# Patient Record
Sex: Female | Born: 1940 | Race: White | Hispanic: No | Marital: Married | Smoking: Former smoker
Health system: Southern US, Community
[De-identification: ages and names within clinical notes are randomized; demographics above are authoritative.]

## PROBLEM LIST (undated history)

## (undated) DIAGNOSIS — C719 Malignant neoplasm of brain, unspecified: Secondary | ICD-10-CM

## (undated) HISTORY — DX: Malignant neoplasm of brain, unspecified: C71.9

---

## 1992-10-13 HISTORY — PX: APPENDECTOMY: SHX54

## 1999-09-04 ENCOUNTER — Other Ambulatory Visit: Admission: RE | Admit: 1999-09-04 | Discharge: 1999-09-04 | Payer: Self-pay | Admitting: Obstetrics & Gynecology

## 1999-09-10 ENCOUNTER — Other Ambulatory Visit: Admission: RE | Admit: 1999-09-10 | Discharge: 1999-09-10 | Payer: Self-pay | Admitting: Internal Medicine

## 1999-09-10 ENCOUNTER — Encounter (INDEPENDENT_AMBULATORY_CARE_PROVIDER_SITE_OTHER): Payer: Self-pay

## 2001-01-11 ENCOUNTER — Ambulatory Visit (HOSPITAL_COMMUNITY): Admission: RE | Admit: 2001-01-11 | Discharge: 2001-01-11 | Payer: Self-pay | Admitting: Pulmonary Disease

## 2001-01-11 ENCOUNTER — Encounter: Payer: Self-pay | Admitting: Pulmonary Disease

## 2001-02-17 ENCOUNTER — Other Ambulatory Visit: Admission: RE | Admit: 2001-02-17 | Discharge: 2001-02-17 | Payer: Self-pay | Admitting: Obstetrics & Gynecology

## 2002-03-08 ENCOUNTER — Other Ambulatory Visit: Admission: RE | Admit: 2002-03-08 | Discharge: 2002-03-08 | Payer: Self-pay | Admitting: Obstetrics & Gynecology

## 2003-08-07 ENCOUNTER — Other Ambulatory Visit: Admission: RE | Admit: 2003-08-07 | Discharge: 2003-08-07 | Payer: Self-pay | Admitting: Obstetrics & Gynecology

## 2004-08-20 ENCOUNTER — Ambulatory Visit: Payer: Self-pay | Admitting: Internal Medicine

## 2004-09-02 ENCOUNTER — Other Ambulatory Visit: Admission: RE | Admit: 2004-09-02 | Discharge: 2004-09-02 | Payer: Self-pay | Admitting: Obstetrics & Gynecology

## 2004-09-03 ENCOUNTER — Ambulatory Visit: Payer: Self-pay | Admitting: Internal Medicine

## 2005-10-20 ENCOUNTER — Ambulatory Visit: Payer: Self-pay | Admitting: Pulmonary Disease

## 2005-11-04 ENCOUNTER — Ambulatory Visit: Payer: Self-pay | Admitting: Pulmonary Disease

## 2005-11-24 ENCOUNTER — Other Ambulatory Visit: Admission: RE | Admit: 2005-11-24 | Discharge: 2005-11-24 | Payer: Self-pay | Admitting: Obstetrics & Gynecology

## 2005-12-02 ENCOUNTER — Encounter: Admission: RE | Admit: 2005-12-02 | Discharge: 2005-12-02 | Payer: Self-pay | Admitting: Obstetrics & Gynecology

## 2005-12-31 ENCOUNTER — Ambulatory Visit: Payer: Self-pay | Admitting: Pulmonary Disease

## 2006-12-23 ENCOUNTER — Encounter: Admission: RE | Admit: 2006-12-23 | Discharge: 2006-12-23 | Payer: Self-pay | Admitting: Obstetrics & Gynecology

## 2007-01-06 ENCOUNTER — Encounter: Admission: RE | Admit: 2007-01-06 | Discharge: 2007-01-06 | Payer: Self-pay | Admitting: Obstetrics & Gynecology

## 2007-05-21 ENCOUNTER — Ambulatory Visit: Payer: Self-pay | Admitting: Pulmonary Disease

## 2008-01-03 ENCOUNTER — Encounter: Admission: RE | Admit: 2008-01-03 | Discharge: 2008-01-03 | Payer: Self-pay | Admitting: Pulmonary Disease

## 2008-07-27 DIAGNOSIS — M545 Low back pain, unspecified: Secondary | ICD-10-CM | POA: Insufficient documentation

## 2008-07-27 DIAGNOSIS — I872 Venous insufficiency (chronic) (peripheral): Secondary | ICD-10-CM | POA: Insufficient documentation

## 2008-07-27 DIAGNOSIS — I491 Atrial premature depolarization: Secondary | ICD-10-CM

## 2008-07-27 DIAGNOSIS — D126 Benign neoplasm of colon, unspecified: Secondary | ICD-10-CM

## 2008-07-27 DIAGNOSIS — M4120 Other idiopathic scoliosis, site unspecified: Secondary | ICD-10-CM | POA: Insufficient documentation

## 2008-07-27 DIAGNOSIS — K649 Unspecified hemorrhoids: Secondary | ICD-10-CM | POA: Insufficient documentation

## 2008-07-27 DIAGNOSIS — Z8679 Personal history of other diseases of the circulatory system: Secondary | ICD-10-CM | POA: Insufficient documentation

## 2008-07-27 DIAGNOSIS — I4510 Unspecified right bundle-branch block: Secondary | ICD-10-CM

## 2008-07-28 ENCOUNTER — Ambulatory Visit: Payer: Self-pay | Admitting: Pulmonary Disease

## 2008-07-28 DIAGNOSIS — E785 Hyperlipidemia, unspecified: Secondary | ICD-10-CM

## 2008-07-28 DIAGNOSIS — Z87448 Personal history of other diseases of urinary system: Secondary | ICD-10-CM

## 2008-07-28 DIAGNOSIS — D649 Anemia, unspecified: Secondary | ICD-10-CM

## 2008-07-28 DIAGNOSIS — K5730 Diverticulosis of large intestine without perforation or abscess without bleeding: Secondary | ICD-10-CM | POA: Insufficient documentation

## 2008-07-29 LAB — CONVERTED CEMR LAB
AST: 27 units/L (ref 0–37)
Albumin: 3.9 g/dL (ref 3.5–5.2)
Alkaline Phosphatase: 85 units/L (ref 39–117)
BUN: 12 mg/dL (ref 6–23)
Basophils Absolute: 0 10*3/uL (ref 0.0–0.1)
Basophils Relative: 0.5 % (ref 0.0–3.0)
Chloride: 105 meq/L (ref 96–112)
Cholesterol: 195 mg/dL (ref 0–200)
Crystals: NEGATIVE
Eosinophils Absolute: 0.2 10*3/uL (ref 0.0–0.7)
GFR calc Af Amer: 93 mL/min
GFR calc non Af Amer: 77 mL/min
HDL: 56.9 mg/dL (ref >39.0)
Ketones, ur: NEGATIVE mg/dL
LDL Cholesterol: 123 mg/dL — ABNORMAL HIGH (ref 0–99)
Leukocytes, UA: NEGATIVE
Lymphocytes Relative: 36.8 % (ref 12.0–46.0)
MCHC: 34 g/dL (ref 30.0–36.0)
MCV: 86.8 fL (ref 78.0–100.0)
Mucus, UA: NEGATIVE
Neutrophils Relative %: 52.6 % (ref 43.0–77.0)
Nitrite: NEGATIVE
Platelets: 221 10*3/uL (ref 150–400)
Potassium: 4 meq/L (ref 3.5–5.1)
RBC: 4.88 M/uL (ref 3.87–5.11)
Sodium: 143 meq/L (ref 135–145)
Specific Gravity, Urine: <=1.005 (ref 1.000–1.03)
Total Bilirubin: 0.8 mg/dL (ref 0.3–1.2)
Urobilinogen, UA: 0.2 (ref 0.0–1.0)
VLDL: 15 mg/dL (ref 0–40)
WBC: 5.6 10*3/uL (ref 4.5–10.5)
pH: 7 (ref 5.0–8.0)

## 2009-01-08 ENCOUNTER — Encounter: Admission: RE | Admit: 2009-01-08 | Discharge: 2009-01-08 | Payer: Self-pay | Admitting: Obstetrics & Gynecology

## 2010-04-24 ENCOUNTER — Encounter: Admission: RE | Admit: 2010-04-24 | Discharge: 2010-04-24 | Payer: Self-pay | Admitting: Pulmonary Disease

## 2010-07-10 ENCOUNTER — Ambulatory Visit: Payer: Self-pay | Admitting: Pulmonary Disease

## 2010-07-10 ENCOUNTER — Encounter: Payer: Self-pay | Admitting: Pulmonary Disease

## 2010-07-10 DIAGNOSIS — J351 Hypertrophy of tonsils: Secondary | ICD-10-CM

## 2010-07-11 LAB — CONVERTED CEMR LAB
ALT: 21 units/L (ref 0–35)
AST: 23 units/L (ref 0–37)
Albumin: 3.9 g/dL (ref 3.5–5.2)
Alkaline Phosphatase: 90 units/L (ref 39–117)
Basophils Absolute: 0 10*3/uL (ref 0.0–0.1)
Chloride: 104 meq/L (ref 96–112)
Cholesterol: 195 mg/dL (ref 0–200)
GFR calc non Af Amer: 73.73 mL/min (ref >60)
Glucose, Bld: 89 mg/dL (ref 70–99)
HCT: 44.1 % (ref 36.0–46.0)
Lymphs Abs: 2.7 10*3/uL (ref 0.7–4.0)
MCV: 89 fL (ref 78.0–100.0)
Monocytes Absolute: 0.5 10*3/uL (ref 0.1–1.0)
Neutrophils Relative %: 53.2 % (ref 43.0–77.0)
Platelets: 238 10*3/uL (ref 150.0–400.0)
Potassium: 4.5 meq/L (ref 3.5–5.1)
RDW: 13.8 % (ref 11.5–14.6)
Sodium: 142 meq/L (ref 135–145)
Total Bilirubin: 0.6 mg/dL (ref 0.3–1.2)

## 2010-11-03 ENCOUNTER — Encounter: Payer: Self-pay | Admitting: Obstetrics & Gynecology

## 2010-11-12 NOTE — Assessment & Plan Note (Signed)
Summary: follow up/la   CC:  2 year ROV & review of mult medical problems....  History of Present Illness: 70 y/o WF here for a follow up visit... she has multiple medical problems as noted below...     ~  Oct09:  she states that she has been doing well and has no new complaints or concerns today... she is not on any perscription drugs- takes ASA 81mg /d, Calcium syupplements, and MVI...   ~  July 10, 2010:  34yr ROV- states she is doing well, recently retired, travelling, no new complaints or concerns... weight is up sl to 186# (BMI=29) & we discussed diet + exercise program... denies CP/ palpit/ SOB, edema;  denies abd pain/ N V/ C D/ blood etc;  no urinary symptoms; denies back pain or signif arthritic complaints; GYN= DrNeal for PAP, Mammograms, BMD...    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 eval by Ramiro Harvest for ENT in 1996> appeared benign & no change serially x yrs.  RIGHT BUNDLE BRANCH BLOCK (ICD-426.4) - on ASA 81mg /d... PALPITATIONS, HX OF (ICD-V12.50) - notes rare skip only... Hx of PREMATURE ATRIAL CONTRACTIONS (ICD-427.61) - s/p cardiac eval by DrDowney in 2004- hx palpit x yrs; negative event recorder x for PAC's... baseline EKG w/ RBBB...  ~  9/11:  notes rare palpit w/o change- EKG w/ NSR, RBBB, NAD...  VENOUS INSUFFICIENCY (ICD-459.81) - follows a low sodium diet, elevates when nec, and wears support hose Prn.  HYPERCHOLESTEROLEMIA, BORDERLINE (ICD-272.4) - on diet alone...  ~  FLP 4/08 at [facility] showed TChol 208, TG 143, HDL 50, LDL 129... she prefers diet Rx...  ~  FLP 10/09 showed TChol 195, TG 76, HDL 57, LDL 123... continue diet efforts.  ~  FLP 9/11 showed TChol 195, TG 92, HDL 55, LDL 122  OVERWEIGHT - we discussed diet + exercise program required to aide in weight reduction.  ~  old chart indicates weight thru the 90's betw 160-170#  ~  weight  3/07 = 170#  ~  weight 10/09 = 177#  ~  weight 9/11 = 186#, 67" tall, BMI= 29...  DIVERTICULOSIS OF COLON (ICD-562.10) COLONIC POLYPS (ICD-211.3) HEMORRHOIDS (ICD-455.6) - last colonoscopy 11/05 by DrBrodie showed hem's only- f/u planned 78yrs... prev colonoscopy 11/00 showed mild divertics and a cecal polyp= hyperplastic lymphoid nodule...   HEMATURIA, HX OF (ICD-V13.09) - eval 2006 by DrDahlstedt - neg renal ultrasound, neg cysto, CT Abd/ Pelvis was also negative... she had seen DrEvans in 2000 w/ chr cystitis treated w/ macrobid for awhile.  BACK PAIN, LUMBAR (ICD-724.2) - currently she denies back pain etc... SCOLIOSIS (ICD-737.30)  Hx of ANEMIA, MILD (ICD-285.9) - this has resolved w/ Hg= 14=15 range x yrs now...  Health Maintenance -  GYN= DrNeal and he does her PAP, Mammograms ([facility]), & BMD...   Preventive Screening-Counseling & Management  Alcohol-Tobacco     Smoking Status: quit     Packs/Day: 0.25     Year Quit: 1996  Allergies: 1)  ! Penicillin  Comments:  Nurse/Medical Assistant: The patient's medications and allergies were reviewed with the patient and were updated in the Medication and Allergy Lists.  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 Father died age 47 from ?alcohol Mother died aqge 41 w/ CAD & CABG 5 Sibling: 1 Bro w/ MI & CABG 1 Bro w/ alzheimer's  1 Sis w/ TKR  Social  History: Reviewed history from 07/28/2008 and no changes required. quit smoking in 1996 no etoh caffeine use:  1 dr. pepper per day married 2 children Packs/Day:  0.25  Review of Systems  The patient denies fever, chills, sweats, anorexia, fatigue, weakness, malaise, weight loss, sleep disorder, blurring, diplopia, eye irritation, eye discharge, vision loss, eye pain, photophobia, earache, ear discharge, tinnitus, decreased hearing, nasal congestion, nosebleeds, sore throat, hoarseness, chest pain, palpitations, syncope, dyspnea on exertion, orthopnea, PND, peripheral edema, cough, dyspnea at rest, excessive sputum, hemoptysis, wheezing, pleurisy, nausea, vomiting, diarrhea, constipation, change in bowel habits, abdominal pain, melena, hematochezia, jaundice, gas/bloating, indigestion/heartburn, dysphagia, odynophagia, dysuria, hematuria, urinary frequency, urinary hesitancy, nocturia, incontinence, back pain, joint pain, joint swelling, muscle cramps, muscle weakness, stiffness, arthritis, sciatica, restless legs, leg pain at night, leg pain with exertion, rash, itching, dryness, suspicious lesions, paralysis, paresthesias, seizures, tremors, vertigo, transient blindness, frequent falls, frequent headaches, difficulty walking, depression, anxiety, memory loss, confusion, cold intolerance, heat intolerance, polydipsia, polyphagia, polyuria, unusual weight change, abnormal bruising, bleeding, enlarged lymph nodes, urticaria, allergic rash, hay fever, and recurrent infections.    Vital Signs:  Patient profile:   70 year old female Height:      67 inches Weight:      185.38 pounds BMI:     29.14 O2 Sat:      97 % on Room air Temp:     97.0 degrees F oral Pulse rate:   80 / minute BP sitting:   122 / 80  (right arm) Cuff size:   regular  Vitals Entered By: Randell Loop CMA (July 10, 2010 12:11 PM)  O2 Sat at Rest %:  97 O2 Flow:  Room air CC: 2 year ROV & review of mult medical  problems... Is Patient Diabetic? No Pain Assessment Patient in pain? no      Comments no changes in meds today   Physical Exam  Additional Exam:  WD, WN, 70 y/o WF in NAD... GENERAL:  Alert & oriented; pleasant & cooperative... HEENT:  [place]/AT, EOM-wnl, PERRLA, EACs-clear, TMs-wnl, NOSE-clear, THROAT- unilat right tonsillar hypertrophy (no change). NECK:  Supple w/ full ROM; no JVD; normal carotid impulses w/o bruits; no thyromegaly or nodules palpated; no lymphadenopathy. CHEST:  Clear to P & A; without wheezes/ rales/ or rhonchi. HEART:  Regular Rhythm; without murmurs/ rubs/ or gallops. ABDOMEN:  Soft & nontender; normal bowel sounds; no organomegaly or masses detected. EXT: without deformities, mild arthritic changes; no varicose veins/ venous insuffic/ or edema. NEURO:  CN's intact; motor testing normal; sensory testing normal; gait normal & balance OK. DERM:  No lesions noted; no rash etc...    CXR  Procedure date:  07/10/2010  Findings:      CHEST - 2 VIEW Comparison: 07/28/2008   Findings: Significant dextroconvex thoracic scoliosis and pectus excavatum. Enlargement of cardiac silhouette. Tortuous aorta. Pulmonary vascularity normal. Minimal bibasilar atelectasis. Lungs otherwise clear. No pleural effusion or pneumothorax.   IMPRESSION: Enlargement of cardiac silhouette. Minimal bibasilar atelectasis.   Read By:  Lollie Marrow,  M.D.   EKG  Procedure date:  07/10/2010  Findings:      Normal sinus rhythm with rate of:  58/ min... Tracing shows RBBB, otherw neg- no change...  SN   MISC. Report  Procedure date:  07/10/2010  Findings:      BMP (METABOL)   Sodium                    142 mEq/L                   135-145   Potassium                 4.5 mEq/L                   3.5-5.1   Chloride                  104 mEq/L                   96-112   Carbon Dioxide            31 mEq/L                    19-32   Glucose                   89 mg/dL                     44-01   BUN                       13 mg/dL                    0-27   Creatinine                0.8 mg/dL                   2.5-3.6   Calcium                   9.3 mg/dL                   6.4-40.3   GFR                       73.73 mL/min                >60  Hepatic/Liver Function Panel (HEPATIC)   Total Bilirubin           0.6 mg/dL                   4.7-4.2   Direct Bilirubin          0.1 mg/dL                   5.9-5.6   Alkaline Phosphatase      90 U/L                      39-117   AST                       23 U/L                      0-37   ALT                       21 U/L  0-35   Total Protein             8.1 g/dL                    1.6-1.0   Albumin                   3.9 g/dL                    9.6-0.4  CBC Platelet w/Diff (CBCD)   White Cell Count          7.5 K/uL                    4.5-10.5   Red Cell Count            4.95 Mil/uL                 3.87-5.11   Hemoglobin                14.7 g/dL                   54.0-98.1   Hematocrit                44.1 %                      36.0-46.0   MCV                       89.0 fl                     78.0-100.0   Platelet Count            238.0 K/uL                  150.0-400.0   Neutrophil %              53.2 %                      43.0-77.0   Lymphocyte %              36.6 %                      12.0-46.0   Monocyte %                6.9 %                       3.0-12.0   Eosinophils%              2.8 %                       0.0-5.0   Basophils %               0.5 %                       0.0-3.0  Comments:      Lipid Panel (LIPID)   Cholesterol               195 mg/dL                   1-914   Triglycerides             92.0 mg/dL  0.0-149.0   HDL                       16.10 mg/dL                 >96.04   LDL Cholesterol      [H]  540 mg/dL                   9-81  TSH (TSH)   FastTSH                   1.08 uIU/mL                 0.35-5.50   Impression & Recommendations:  Problem # 1:   TONSILLAR HYPERTROPHY, 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 no adenopathy.  Problem # 2:  RIGHT BUNDLE BRANCH BLOCK (ICD-426.4) EKG also w/o change in RBBB, otherw neg... Orders: 12 Lead EKG (12 Lead EKG) T-2 View CXR (71020TC) TLB-BMP (Basic Metabolic Panel-BMET) (80048-METABOL) TLB-Hepatic/Liver Function Pnl (80076-HEPATIC) TLB-CBC Platelet - w/Differential (85025-CBCD) TLB-Lipid Panel (80061-LIPID) TLB-TSH (Thyroid Stimulating Hormone) (84443-TSH)  Problem # 3:  VENOUS INSUFFICIENCY (ICD-459.81) Min chr ven insuffic, no signif edema>  she knows to elim salt, elevate, support hose, etc...  Problem # 4:  HYPERCHOLESTEROLEMIA, BORDERLINE (ICD-272.4) FLP shows  LDL not at goal... we discussed diet + exercise & she bekieves she can improve her numbers w/ wt reduction...  Problem # 5:  COLONIC POLYPS (ICD-211.3) GI is stable & up to date...  Problem # 6:  HEMATURIA, HX OF (ICD-V13.09) GU is stable & she denies symptoms... prev work up was neg... also followed by GYN DrNeal.  Problem # 7:  SCOLIOSIS (ICD-737.30) She denies chest/ back discomfort... rec to stay active... Orders: T-2 View CXR (71020TC)  Complete Medication List: 1)  Aspirin Adult Low Strength 81 Mg Tbec (Aspirin) .... Take 1 tablet by mouth once a day 2)  Caltrate 600+d 600-400 Mg-unit Tabs (Calcium carbonate-vitamin d) .... Take 1 tab by mouth once daily.Marland KitchenMarland Kitchen 3)  Womens Multivitamin Plus Tabs (Multiple vitamins-minerals) .... Take 1 tab by mouth once daily.Marland KitchenMarland Kitchen 4)  Vitamin D 1000 Unit Tabs (Cholecalciferol) .... Take 1 tab by mouth once daily...  Patient Instructions: 1)  Today we updated your med list- see below.... 2)  Congrats on your retirement!!! 3)  Let's get on track w/ our diet + exercise program... the goal is to lose 15-20 lbs over the next year. 4)  Today we did your follow up CXR, EKG, & FASTING blood work... please call the "phone tree" in a few days for your lab results.Marland KitchenMarland Kitchen 5)  Call for any  problems.Marland KitchenMarland Kitchen 6)  Please schedule a follow-up appointment in 1 year, sooner as needed...   Immunization History:  Influenza Immunization History:    Influenza:  historical (07/25/2009)

## 2011-05-05 ENCOUNTER — Other Ambulatory Visit: Payer: Self-pay | Admitting: Pulmonary Disease

## 2011-05-05 DIAGNOSIS — Z1231 Encounter for screening mammogram for malignant neoplasm of breast: Secondary | ICD-10-CM

## 2011-05-12 ENCOUNTER — Ambulatory Visit
Admission: RE | Admit: 2011-05-12 | Discharge: 2011-05-12 | Disposition: A | Discharge status: Home / Self Care | Payer: BC Managed Care – PPO | Source: Ambulatory Visit | Attending: Pulmonary Disease | Admitting: Pulmonary Disease

## 2011-05-12 DIAGNOSIS — Z1231 Encounter for screening mammogram for malignant neoplasm of breast: Secondary | ICD-10-CM

## 2011-07-10 ENCOUNTER — Ambulatory Visit: Payer: Self-pay | Admitting: Pulmonary Disease

## 2013-01-05 ENCOUNTER — Other Ambulatory Visit: Payer: Self-pay | Admitting: Pulmonary Disease

## 2013-01-05 DIAGNOSIS — Z139 Encounter for screening, unspecified: Secondary | ICD-10-CM

## 2013-01-27 ENCOUNTER — Ambulatory Visit
Admission: RE | Admit: 2013-01-27 | Discharge: 2013-01-27 | Disposition: A | Discharge status: Home / Self Care | Payer: Medicare Other | Source: Ambulatory Visit | Attending: Pulmonary Disease | Admitting: Pulmonary Disease

## 2013-01-27 DIAGNOSIS — Z139 Encounter for screening, unspecified: Secondary | ICD-10-CM

## 2014-06-14 ENCOUNTER — Encounter: Payer: Self-pay | Admitting: Internal Medicine

## 2014-07-21 ENCOUNTER — Other Ambulatory Visit (HOSPITAL_COMMUNITY): Payer: Self-pay | Admitting: Family Medicine

## 2014-07-21 DIAGNOSIS — Z0000 Encounter for general adult medical examination without abnormal findings: Secondary | ICD-10-CM

## 2014-07-28 ENCOUNTER — Ambulatory Visit (HOSPITAL_COMMUNITY)
Admission: RE | Admit: 2014-07-28 | Discharge: 2014-07-28 | Disposition: A | Discharge status: Home / Self Care | Payer: Medicare Other | Source: Ambulatory Visit | Attending: Family Medicine | Admitting: Family Medicine

## 2014-07-28 DIAGNOSIS — R29890 Loss of height: Secondary | ICD-10-CM | POA: Diagnosis not present

## 2014-07-28 DIAGNOSIS — Z0000 Encounter for general adult medical examination without abnormal findings: Secondary | ICD-10-CM | POA: Insufficient documentation

## 2014-07-28 DIAGNOSIS — N958 Other specified menopausal and perimenopausal disorders: Secondary | ICD-10-CM | POA: Insufficient documentation

## 2015-01-16 ENCOUNTER — Encounter: Payer: Self-pay | Admitting: Internal Medicine

## 2016-11-19 ENCOUNTER — Ambulatory Visit (HOSPITAL_COMMUNITY)
Admission: RE | Admit: 2016-11-19 | Discharge: 2016-11-19 | Disposition: A | Discharge status: Home / Self Care | Payer: Medicare Other | Source: Ambulatory Visit | Attending: Family Medicine | Admitting: Family Medicine

## 2016-11-19 ENCOUNTER — Other Ambulatory Visit (HOSPITAL_COMMUNITY): Payer: Self-pay | Admitting: Family Medicine

## 2016-11-19 DIAGNOSIS — J209 Acute bronchitis, unspecified: Secondary | ICD-10-CM | POA: Diagnosis present

## 2016-11-19 DIAGNOSIS — R531 Weakness: Secondary | ICD-10-CM | POA: Diagnosis present

## 2016-11-19 DIAGNOSIS — R5383 Other fatigue: Secondary | ICD-10-CM | POA: Diagnosis present

## 2016-11-20 ENCOUNTER — Encounter (HOSPITAL_COMMUNITY): Payer: Self-pay

## 2016-11-20 ENCOUNTER — Emergency Department (HOSPITAL_COMMUNITY): Payer: Medicare Other

## 2016-11-20 ENCOUNTER — Observation Stay (HOSPITAL_COMMUNITY)
Admission: EM | Admit: 2016-11-20 | Discharge: 2016-11-21 | Disposition: A | Discharge status: Home / Self Care | Payer: Medicare Other | Attending: Student in an Organized Health Care Education/Training Program | Admitting: Student in an Organized Health Care Education/Training Program

## 2016-11-20 DIAGNOSIS — D496 Neoplasm of unspecified behavior of brain: Secondary | ICD-10-CM

## 2016-11-20 DIAGNOSIS — R29898 Other symptoms and signs involving the musculoskeletal system: Secondary | ICD-10-CM

## 2016-11-20 DIAGNOSIS — E7800 Pure hypercholesterolemia, unspecified: Secondary | ICD-10-CM | POA: Diagnosis not present

## 2016-11-20 DIAGNOSIS — C711 Malignant neoplasm of frontal lobe: Secondary | ICD-10-CM | POA: Diagnosis not present

## 2016-11-20 DIAGNOSIS — M419 Scoliosis, unspecified: Secondary | ICD-10-CM | POA: Insufficient documentation

## 2016-11-20 DIAGNOSIS — E785 Hyperlipidemia, unspecified: Secondary | ICD-10-CM | POA: Insufficient documentation

## 2016-11-20 DIAGNOSIS — Z86010 Personal history of colonic polyps: Secondary | ICD-10-CM | POA: Insufficient documentation

## 2016-11-20 DIAGNOSIS — R531 Weakness: Secondary | ICD-10-CM | POA: Diagnosis present

## 2016-11-20 DIAGNOSIS — I10 Essential (primary) hypertension: Secondary | ICD-10-CM | POA: Insufficient documentation

## 2016-11-20 DIAGNOSIS — Z880 Allergy status to penicillin: Secondary | ICD-10-CM | POA: Insufficient documentation

## 2016-11-20 DIAGNOSIS — K529 Noninfective gastroenteritis and colitis, unspecified: Secondary | ICD-10-CM | POA: Diagnosis not present

## 2016-11-20 DIAGNOSIS — D649 Anemia, unspecified: Secondary | ICD-10-CM | POA: Diagnosis not present

## 2016-11-20 DIAGNOSIS — I872 Venous insufficiency (chronic) (peripheral): Secondary | ICD-10-CM | POA: Diagnosis not present

## 2016-11-20 DIAGNOSIS — I493 Ventricular premature depolarization: Secondary | ICD-10-CM | POA: Insufficient documentation

## 2016-11-20 DIAGNOSIS — M545 Low back pain: Secondary | ICD-10-CM | POA: Diagnosis not present

## 2016-11-20 DIAGNOSIS — K5730 Diverticulosis of large intestine without perforation or abscess without bleeding: Secondary | ICD-10-CM | POA: Diagnosis not present

## 2016-11-20 DIAGNOSIS — Q676 Pectus excavatum: Secondary | ICD-10-CM | POA: Insufficient documentation

## 2016-11-20 DIAGNOSIS — I4510 Unspecified right bundle-branch block: Secondary | ICD-10-CM | POA: Insufficient documentation

## 2016-11-20 DIAGNOSIS — Z87891 Personal history of nicotine dependence: Secondary | ICD-10-CM | POA: Insufficient documentation

## 2016-11-20 LAB — I-STAT TROPONIN, ED: TROPONIN I, POC: 0 ng/mL (ref 0.00–0.08)

## 2016-11-20 LAB — CBC
HEMATOCRIT: 43.8 % (ref 36.0–46.0)
Hemoglobin: 14.2 g/dL (ref 12.0–15.0)
MCH: 28.2 pg (ref 26.0–34.0)
MCHC: 32.4 g/dL (ref 30.0–36.0)
MCV: 86.9 fL (ref 78.0–100.0)
Platelets: 233 10*3/uL (ref 150–400)
RBC: 5.04 MIL/uL (ref 3.87–5.11)
RDW: 13.3 % (ref 11.5–15.5)
WBC: 7.1 10*3/uL (ref 4.0–10.5)

## 2016-11-20 LAB — COMPREHENSIVE METABOLIC PANEL
ALT: 19 U/L (ref 14–54)
ANION GAP: 10 (ref 5–15)
AST: 28 U/L (ref 15–41)
Albumin: 3.9 g/dL (ref 3.5–5.0)
Alkaline Phosphatase: 86 U/L (ref 38–126)
BUN: 9 mg/dL (ref 6–20)
CHLORIDE: 103 mmol/L (ref 101–111)
CO2: 26 mmol/L (ref 22–32)
Calcium: 9.3 mg/dL (ref 8.9–10.3)
Creatinine, Ser: 0.82 mg/dL (ref 0.44–1.00)
Glucose, Bld: 111 mg/dL — ABNORMAL HIGH (ref 65–99)
POTASSIUM: 3.6 mmol/L (ref 3.5–5.1)
Sodium: 139 mmol/L (ref 135–145)
TOTAL PROTEIN: 8.4 g/dL — AB (ref 6.5–8.1)
Total Bilirubin: 0.6 mg/dL (ref 0.3–1.2)

## 2016-11-20 LAB — DIFFERENTIAL
BASOS ABS: 0 10*3/uL (ref 0.0–0.1)
BASOS PCT: 0 %
EOS ABS: 0.1 10*3/uL (ref 0.0–0.7)
Eosinophils Relative: 1 %
Lymphocytes Relative: 24 %
Lymphs Abs: 1.7 10*3/uL (ref 0.7–4.0)
MONO ABS: 0.6 10*3/uL (ref 0.1–1.0)
MONOS PCT: 9 %
Neutro Abs: 4.7 10*3/uL (ref 1.7–7.7)
Neutrophils Relative %: 66 %

## 2016-11-20 LAB — I-STAT CHEM 8, ED
BUN: 11 mg/dL (ref 6–20)
Calcium, Ion: 1.07 mmol/L — ABNORMAL LOW (ref 1.15–1.40)
Chloride: 104 mmol/L (ref 101–111)
Creatinine, Ser: 0.8 mg/dL (ref 0.44–1.00)
Glucose, Bld: 111 mg/dL — ABNORMAL HIGH (ref 65–99)
HEMATOCRIT: 46 % (ref 36.0–46.0)
HEMOGLOBIN: 15.6 g/dL — AB (ref 12.0–15.0)
POTASSIUM: 3.6 mmol/L (ref 3.5–5.1)
Sodium: 143 mmol/L (ref 135–145)
TCO2: 26 mmol/L (ref 0–100)

## 2016-11-20 LAB — PROTIME-INR
INR: 1.09
Prothrombin Time: 14.1 seconds (ref 11.4–15.2)

## 2016-11-20 LAB — APTT: APTT: 31 s (ref 24–36)

## 2016-11-20 MED ORDER — ACETAMINOPHEN 325 MG PO TABS
650.0000 mg | ORAL_TABLET | Freq: Four times a day (QID) | ORAL | Status: DC | PRN
  Administered 2016-11-21: 650 mg via ORAL
  Filled 2016-11-20: qty 2

## 2016-11-20 MED ORDER — DEXAMETHASONE SODIUM PHOSPHATE 10 MG/ML IJ SOLN
10.0000 mg | Freq: Once | INTRAMUSCULAR | Status: AC
  Administered 2016-11-20: 10 mg via INTRAVENOUS
  Filled 2016-11-20: qty 1

## 2016-11-20 MED ORDER — ACETAMINOPHEN 650 MG RE SUPP: 650.0000 mg | Freq: Four times a day (QID) | RECTAL | Status: DC | PRN

## 2016-11-20 MED ORDER — DEXAMETHASONE SODIUM PHOSPHATE 10 MG/ML IJ SOLN
4.0000 mg | Freq: Four times a day (QID) | INTRAMUSCULAR | Status: DC
  Administered 2016-11-21 (×4): 4 mg via INTRAVENOUS
  Filled 2016-11-20 (×4): qty 1

## 2016-11-20 MED ORDER — ENOXAPARIN SODIUM 40 MG/0.4ML ~~LOC~~ SOLN: 40.0000 mg | SUBCUTANEOUS | Status: DC

## 2016-11-20 MED ORDER — GADOBENATE DIMEGLUMINE 529 MG/ML IV SOLN
18.0000 mL | Freq: Once | INTRAVENOUS | Status: AC | PRN
  Administered 2016-11-20: 18 mL via INTRAVENOUS

## 2016-11-20 NOTE — ED Triage Notes (Signed)
Pt complaining of L sided weakness and L arm numbness. Pt states LSN yesterday. Per family, no slurred speech. Pt with some difficulty following direction. Pt a/o x 4.

## 2016-11-20 NOTE — H&P (Signed)
Date: 11/20/2016               Patient Name:  Loretta Getz MRN: [id number]  DOB: [birth year] Age / Sex: 76 y.o., female   PCP: Sharilyn Sites, MD         Medical Service: Internal Medicine Teaching Service         Attending Physician: Dr. Axel Filler, MD    First Contact: Dr. Alphonzo Grieve Pager: [phone]  Second Contact: Dr. Maryellen Pile Pager: [phone]       After Hours (After 5p/  First Contact Pager: [phone]  weekends / holidays): Second Contact Pager: [phone]   Chief Complaint: L weakness  History of Present Illness: Ms. Hero Prem is a 76 y.o. female with a h/o of HLD, PACs, and borderline HTN who presents with acute onset of L sided weakness and numbness for 1 day.  Pt was reportedly in her normal state of health until yesterday (11/19/16) morning when she experienced difficulty with dressing herself. She had recently recovered from bronchitis 2 wks prior and was still feeling generalized weakness, but the onset of unilateral weakness was new yesterday. She noticed profound weakness of the LUE which did not improve. She denies any difficulty with speech or swallowing. No headache, CP, or SOB.  In the ED, she was evaluated with basic labs which were unremarkable. She underwent CT Head for concern of CVA which demonstrated R frontoparietal lesion of 2.2 cm w/ vasogenic edema which was further evaluated with MRI. 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 No findings concerning for bleed or infarct. She was given 10mg  of IV dexamethasone.  Meds: The pt does not take any regular medications.  Allergies: Allergies as of 11/20/2016 - Review Complete 11/20/2016  Allergen Reaction Noted  . Penicillins Other (See Comments) 07/25/2014   History reviewed. No pertinent past medical history. Only significant for HLD, PACs, and  borderline HTN.  Family History: No significant family hx of cancer.  Social History: Social History  Substance Use Topics  . Smoking status: Never Smoker  . Smokeless tobacco: Never Used  . Alcohol use No   Review of Systems: A complete ROS was negative except as per HPI. Review of Systems  Constitutional: Negative for chills, fever and weight loss.  Eyes: Negative for blurred vision.  Respiratory: Positive for cough (Occasional). Negative for shortness of breath.   Cardiovascular: Negative for chest pain and leg swelling.  Gastrointestinal: Negative for abdominal pain, constipation, diarrhea, nausea and vomiting.  Genitourinary: Negative for dysuria, frequency and urgency.  Musculoskeletal: Negative for myalgias.  Skin: Negative for rash.  Neurological: Positive for focal weakness and weakness. Negative for dizziness, tingling, tremors, sensory change, speech change, seizures, loss of consciousness and headaches.  Endo/Heme/Allergies: Negative for polydipsia.  Psychiatric/Behavioral: The patient is not nervous/anxious.     Physical Exam: Vitals:   11/20/16 1643 11/20/16 2128  BP: 163/97 148/72  Pulse: 79 66  Resp: 18 20  Temp: 98.7 F (37.1 C)   TempSrc: Oral   SpO2: 99% 99%   Physical Exam  Constitutional: She is oriented to person, place, and time. She appears well-developed. She is cooperative. No distress.  HENT:  Head: Normocephalic and atraumatic.  Right Ear: Hearing normal.  Left Ear: Hearing normal.  Nose: Nose normal.  Mouth/Throat: Mucous membranes are normal.  Cardiovascular: Normal rate, regular rhythm, S1 normal,  S2 normal and intact distal pulses.  Exam reveals no gallop.   No murmur heard. Pulmonary/Chest: Effort normal and breath sounds normal. No respiratory distress. She has no wheezes. She has no rhonchi. She has no rales. She exhibits no tenderness. Breasts are symmetrical.  Abdominal: Soft. Normal appearance and bowel sounds are normal. She  exhibits no ascites. There is no hepatosplenomegaly. There is no tenderness. There is no CVA tenderness.  Musculoskeletal: Normal range of motion.  Neurological: She is alert and oriented to person, place, and time. She displays abnormal reflex. She displays no tremor. No cranial nerve deficit or sensory deficit. She exhibits normal muscle tone. She displays no seizure activity. Coordination normal.  Reflex Scores:      Bicep reflexes are 3+ on the left side.      Patellar reflexes are 2+ on the right side and 0 on the left side. RUE 5/5, LUE - deltoid 3+/5, bicep 4-/5, forearm/intrinsic hand 4/5 RLE 5/5, LLE - quad/hamsting 4+/5, calf 4-/5   Skin: Skin is warm, dry and intact. She is not diaphoretic.  Psychiatric: She has a normal mood and affect. Her speech is normal and behavior is normal.   Labs: CBC:  Recent Labs Lab 11/20/16 1652 11/20/16 1706  WBC 7.1  --   NEUTROABS 4.7  --   HGB 14.2 15.6*  HCT 43.8 46.0  MCV 86.9  --   PLT 233  --    Basic Metabolic Panel:  Recent Labs Lab 11/20/16 1652 11/20/16 1706  NA 139 143  K 3.6 3.6  CL 103 104  CO2 26  --   GLUCOSE 111* 111*  BUN 9 11  CREATININE 0.82 0.80  CALCIUM 9.3  --    Cardiac Enzymes:  Recent Labs Lab 11/20/16 1704  TROPIPOC 0.00   Coagulation Studies:  Recent Labs  11/20/16 1652  LABPROT 14.1  INR 1.09   Liver Function Tests:  Recent Labs Lab 11/20/16 1652  AST 28  ALT 19  ALKPHOS 86  BILITOT 0.6  PROT 8.4*  ALBUMIN 3.9   Imaging: Dg Chest 2 View  Result Date: 11/19/2016 CLINICAL DATA:  Productive cough for the past 2 weeks. Former smoker. EXAM: CHEST  2 VIEW COMPARISON:  Chest x-ray of July 10, 2010 FINDINGS: The lungs are well-expanded. There is no focal infiltrate. There is a pectus excavatum type chest contour. The heart and pulmonary vascularity are normal. The mediastinum is normal in width. There is no pleural effusion. There is moderate dextrocurvature centered in the lower  thoracic spine which is stable. IMPRESSION: Mild chronic bronchitic changes, stable. No pneumonia, pulmonary edema, nor other acute cardiopulmonary abnormality. Electronically Signed   By: David  Martinique M.D.   On: 11/19/2016 14:26   Ct Head Wo Contrast  Result Date: 11/20/2016 CLINICAL DATA:  Left arm weakness. EXAM: CT HEAD WITHOUT CONTRAST TECHNIQUE: Contiguous axial images were obtained from the base of the skull through the vertex without intravenous contrast. COMPARISON:  None. FINDINGS: Brain: Large amount of edema is identified in the high right frontal parietal region. Appearance is more suggestive of vasogenic edema than cytotoxic edema on this study. Features may be related to a potential mass lesion identified in the high right parietal region measuring 2.2 cm (image 25 series 201). Despite the edema, no subfalcine midline shift is evident. No evidence for hydrocephalus. Vascular: No hyperdense vessel or unexpected calcification. Skull: Normal. Negative for fracture or focal lesion. Sinuses/Orbits: No acute finding. Other: None. IMPRESSION: 1. Relatively large volume  of apparent vasogenic edema in the high right frontoparietal region, likely secondary to a 2.2 cm high frontal parietal lobe lesion at the gray-white junction. MRI without and with contrast would be the study of choice to further evaluate. 2. No substantial mass-effect or midline shift. I discussed these findings by telephone with the triage nurse Valarie Merino) at [place] hours on 05/24/2017. Electronically Signed   By: Misty Stanley M.D.   On: 11/20/2016 18:12   Mr Jeri Cos [id number] Contrast  Result Date: 11/20/2016 CLINICAL DATA:  LEFT-sided weakness for 1 day, assess for stroke. EXAM: MRI HEAD WITHOUT AND WITH CONTRAST TECHNIQUE: Multiplanar, multiecho pulse sequences of the brain and surrounding structures were obtained without and with intravenous contrast. CONTRAST:  38mL MULTIHANCE GADOBENATE DIMEGLUMINE 529 MG/ML IV SOLN COMPARISON:  CT HEAD  November 20, 2016 at 1752 hours FINDINGS: INTRACRANIAL CONTENTS: Ring-enhancing RIGHT frontal lobe convexity intraparenchymal 2.2 x 1.6 x 1.9 cm mass extending to the dura, central subcentimeter susceptibility artifact which could reflect mineralization or hemosiderin. Corresponding reduced diffusion and low ADC values. At least 4 additional subcentimeter satellite nodules anterior to the dominant lesion in the RIGHT frontal lobe juxta cortical white matter. Extensive surrounding T2 bright vasogenic edema and regional sulcal effacement. No midline shift. Ventricles and sulci are normal for patient's age. Patchy supratentorial white matter FLAIR T2 hyperintensities exclusive of the aforementioned abnormality compatible with mild to moderate chronic small vessel ischemic disease. No abnormal extra-axial enhancement. VASCULAR: Normal major intracranial vascular flow voids present at skull base. SKULL AND UPPER CERVICAL SPINE: No abnormal sellar expansion. No suspicious calvarial bone marrow signal. Craniocervical junction maintained. SINUSES/ORBITS: The mastoid air-cells and included paranasal sinuses are well-aerated.The included ocular globes and orbital contents are non-suspicious. OTHER: None. IMPRESSION: Dominant 2.2 x 1.6 x 1.9 cm RIGHT frontal lobe mass with subcentimeter satellite nodules within extensive vasogenic edema. 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 No infarct.  Mild chronic small vessel ischemic disease. Acute findings discussed with and reconfirmed by Dr.ROBERT LOCKWOOD on 11/20/2016 at 8:25 pm. Electronically Signed   By: Elon Alas M.D.   On: 11/20/2016 20:28   Assessment & Plan by Problem: Principal Problem:   GBM (glioblastoma multiforme) (HCC) Active Problems:   Left-sided weakness  Ms. Ranell Claudio is a 76 y.o. female with no significant PMH who presents w/ acute onset L weakness found to  have a large brain mass.  1) Brain Mass: MRI findings highly concerning for GBM, lymphoma and mets lower on the differential. Given Decadron for vasogenic edema. Neurosurgery to plan elective craniotomy for tissue dx w/ or w/o resection as outpt. Per recs, pt can likely DC tomorrow and return for procedure. - admit to med-surg - Neurosurgery c/s, appreciate assistance - Neuro checks q4h - Decadron 4mg  q6h - Keppra ppx  2) HTN: BP was mildly elevated to 160s/90s on presentation, normalizing w/o acute intervention. No home meds. Will continue to monitor.  DVT PPx - low molecular weight heparin  Code Status - Full  Consults Placed - Neurosurgery  Dispo: Admit patient to Observation with expected length of stay less than 2 midnights.  Signed: Holley Raring, MD 11/20/2016, 10:37 PM  Pager: [phone]

## 2016-11-20 NOTE — ED Notes (Signed)
Pt did not need anything at this time  

## 2016-11-20 NOTE — ED Notes (Addendum)
Pt would like ginger ale. Dr advised waiting until MRI results were back.

## 2016-11-20 NOTE — ED Provider Notes (Signed)
Emergency Department Provider Note   I have reviewed the triage vital signs and the nursing notes.   HISTORY  Chief Complaint Extremity Weakness   HPI Donna Valentine is a 76 y.o. female with PMH of HLD, PACs, and borderline HTN presents to the emergency department for evaluation of acute onset left side weakness and mild numbness. Symptoms began yesterday morning the patient noted she was unable to dress herself. She did not seek medical attention immediately. She has been dealing with gastroenteritis symptoms last week and was feeling generally weak but no unilateral symptoms. No speech difficulty, difficulty swallowing, or headache. He denies any chest pain or difficulty breathing.   History reviewed. No pertinent past medical history.  Patient Active Problem List   Diagnosis Date Noted  . 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 Left-sided weakness 11/20/2016  . 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 HYPERCHOLESTEROLEMIA, BORDERLINE 07/28/2008  . ANEMIA, MILD 07/28/2008  . DIVERTICULOSIS OF COLON 07/28/2008  . HEMATURIA, HX OF 07/28/2008  . COLONIC POLYPS 07/27/2008  . RIGHT BUNDLE BRANCH BLOCK 07/27/2008  . PREMATURE ATRIAL CONTRACTIONS 07/27/2008  . HEMORRHOIDS 07/27/2008  . VENOUS INSUFFICIENCY 07/27/2008  . BACK PAIN, LUMBAR 07/27/2008  . SCOLIOSIS 07/27/2008  . PALPITATIONS, HX OF 07/27/2008    History reviewed. No pertinent surgical history.    Allergies Penicillins  History reviewed. No pertinent family history.  Social History Social History  Substance Use Topics  . Smoking status: Never Smoker  . Smokeless tobacco: Never Used  . Alcohol use No    Review of Systems  Constitutional: No fever/chills Eyes: No visual changes. ENT: No sore throat. Cardiovascular: Denies chest pain. Respiratory: Denies shortness of breath. Gastrointestinal: No abdominal pain.  No nausea, no vomiting.  No diarrhea.  No  constipation. Genitourinary: Negative for dysuria. Musculoskeletal: Negative for back pain. Skin: Negative for rash. Neurological: Negative for headaches, focal weakness or numbness.  10-point ROS otherwise negative.  ____________________________________________   PHYSICAL EXAM:  VITAL SIGNS: ED Triage Vitals  Enc Vitals Group     BP 11/20/16 1643 163/97     Pulse Rate 11/20/16 1643 79     Resp 11/20/16 1643 18     Temp 11/20/16 1643 98.7 F (37.1 C)     Temp Source 11/20/16 1643 Oral     SpO2 11/20/16 1643 99 %     Pain Score 11/20/16 1641 0   Constitutional: Alert and oriented. Well appearing and in no acute distress. Eyes: Conjunctivae are normal. Head: Atraumatic. Nose: No congestion/rhinnorhea. Mouth/Throat: Mucous membranes are moist.  Oropharynx non-erythematous. Neck: No stridor.  Cardiovascular: Normal rate, regular rhythm. Good peripheral circulation. Grossly normal heart sounds.   Respiratory: Normal respiratory effort.  No retractions. Lungs CTAB. Gastrointestinal: Soft and nontender. No distention.  Musculoskeletal: No lower extremity tenderness nor edema. No gross deformities of extremities. Neurologic:  Normal speech and language. 3/5 LUE weakness with no associated sensory deficits. No CN deficits appreciated. 4+/5 weakness in LLE.  Skin:  Skin is warm, dry and intact. No rash noted. Psychiatric: Mood and affect are normal. Speech and behavior are normal.  ____________________________________________   LABS (all labs ordered are listed, but only abnormal results are displayed)  Labs Reviewed  COMPREHENSIVE METABOLIC PANEL - Abnormal; Notable for the following:       Result Value   Glucose, Bld 111 (*)    Total Protein 8.4 (*)    All other components within normal limits  I-STAT CHEM 8, ED - Abnormal; Notable for  the following:    Glucose, Bld 111 (*)    Calcium, Ion 1.07 (*)    Hemoglobin 15.6 (*)    All other components within normal limits   PROTIME-INR  APTT  CBC  DIFFERENTIAL  I-STAT TROPOININ, ED  CBG MONITORING, ED   ____________________________________________  RADIOLOGY  Ct Head Wo Contrast  Result Date: 11/20/2016 CLINICAL DATA:  Left arm weakness. EXAM: CT HEAD WITHOUT CONTRAST TECHNIQUE: Contiguous axial images were obtained from the base of the skull through the vertex without intravenous contrast. COMPARISON:  None. FINDINGS: Brain: Large amount of edema is identified in the high right frontal parietal region. Appearance is more suggestive of vasogenic edema than cytotoxic edema on this study. Features may be related to a potential mass lesion identified in the high right parietal region measuring 2.2 cm (image 25 series 201). Despite the edema, no subfalcine midline shift is evident. No evidence for hydrocephalus. Vascular: No hyperdense vessel or unexpected calcification. Skull: Normal. Negative for fracture or focal lesion. Sinuses/Orbits: No acute finding. Other: None. IMPRESSION: 1. Relatively large volume of apparent vasogenic edema in the high right frontoparietal region, likely secondary to a 2.2 cm high frontal parietal lobe lesion at the gray-white junction. MRI without and with contrast would be the study of choice to further evaluate. 2. No substantial mass-effect or midline shift. I discussed these findings by telephone with the triage nurse Valarie Merino) at [place] hours on 05/24/2017. Electronically Signed   By: Misty Stanley M.D.   On: 11/20/2016 18:12   Mr Jeri Cos [id number] Contrast  Result Date: 11/20/2016 CLINICAL DATA:  LEFT-sided weakness for 1 day, assess for stroke. EXAM: MRI HEAD WITHOUT AND WITH CONTRAST TECHNIQUE: Multiplanar, multiecho pulse sequences of the brain and surrounding structures were obtained without and with intravenous contrast. CONTRAST:  48mL MULTIHANCE GADOBENATE DIMEGLUMINE 529 MG/ML IV SOLN COMPARISON:  CT HEAD November 20, 2016 at 1752 hours FINDINGS: INTRACRANIAL CONTENTS: Ring-enhancing RIGHT  frontal lobe convexity intraparenchymal 2.2 x 1.6 x 1.9 cm mass extending to the dura, central subcentimeter susceptibility artifact which could reflect mineralization or hemosiderin. Corresponding reduced diffusion and low ADC values. At least 4 additional subcentimeter satellite nodules anterior to the dominant lesion in the RIGHT frontal lobe juxta cortical white matter. Extensive surrounding T2 bright vasogenic edema and regional sulcal effacement. No midline shift. Ventricles and sulci are normal for patient's age. Patchy supratentorial white matter FLAIR T2 hyperintensities exclusive of the aforementioned abnormality compatible with mild to moderate chronic small vessel ischemic disease. No abnormal extra-axial enhancement. VASCULAR: Normal major intracranial vascular flow voids present at skull base. SKULL AND UPPER CERVICAL SPINE: No abnormal sellar expansion. No suspicious calvarial bone marrow signal. Craniocervical junction maintained. SINUSES/ORBITS: The mastoid air-cells and included paranasal sinuses are well-aerated.The included ocular globes and orbital contents are non-suspicious. OTHER: None. IMPRESSION: Dominant 2.2 x 1.6 x 1.9 cm RIGHT frontal lobe mass with subcentimeter satellite nodules within extensive vasogenic edema. 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 No infarct.  Mild chronic small vessel ischemic disease. Acute findings discussed with and reconfirmed by Dr.ROBERT LOCKWOOD on 11/20/2016 at 8:25 pm. Electronically Signed   By: Elon Alas M.D.   On: 11/20/2016 20:28    ____________________________________________   PROCEDURES  Procedure(s) performed:   Procedures  None ____________________________________________   INITIAL IMPRESSION / ASSESSMENT AND PLAN / ED COURSE  Pertinent labs & imaging results that were available during my care of the patient were reviewed by me  and  considered in my medical decision making (see chart for details).  Patient resents to the emergency department for evaluation of sudden onset left sided weakness. On my exam she has weakness primarily in the left upper extremity with some mild weakness in the left lower. No facial asymmetry. No appreciable sensory deficits.   Discussed MRI results with the patient. I discussed that the findings are very concerning for brain tumor. Spoke with NSG regarding the vasogenic edema and plan for Decadron and admission given LUE weakness and decreased ADLs.   Discussed patient's case with IM team. They will be down to see the patient and place orders. Patient and family (if present) updated with plan. Care transferred to IM teaching service.  I reviewed all nursing notes, vitals, pertinent old records, EKGs, labs, imaging (as available).  ____________________________________________  FINAL CLINICAL IMPRESSION(S) / ED DIAGNOSES  Final diagnoses:  Weakness     MEDICATIONS GIVEN DURING THIS VISIT:  Medications  acetaminophen (TYLENOL) tablet 650 mg (650 mg Oral Given 11/21/16 [id number])    Or  acetaminophen (TYLENOL) suppository 650 mg ( Rectal See Alternative 11/21/16 [id number])  dexamethasone (DECADRON) injection 4 mg (4 mg Intravenous Given 11/21/16 0604)  levETIRAcetam (KEPPRA) tablet 500 mg (500 mg Oral Given 11/21/16 0923)  gadobenate dimeglumine (MULTIHANCE) injection 18 mL (18 mLs Intravenous Contrast Given 11/20/16 2002)  dexamethasone (DECADRON) injection 10 mg (10 mg Intravenous Given 11/20/16 2128)     NEW OUTPATIENT MEDICATIONS STARTED DURING THIS VISIT:  There are no discharge medications for this patient.     Note:  This document was prepared using Dragon voice recognition software and may include unintentional dictation errors.  Nanda Quinton, MD Emergency Medicine   Margette Fast, MD 11/21/16 1106

## 2016-11-20 NOTE — Consult Note (Addendum)
Reason for Consult:enhancing cerebral mass Referring Physician: Long  Donna Valentine is an 76 y.o. female.  HPI: whom was in her usual health until yesterday when while trying to get dressed became very weak in her left upper extremity. This did not improve so she came to the ED today. Head ct revealed possible mass, and surrounding edema. MRI revealed a mass in the right frontal lobe, possible multifocal, enhancing with low signal on t1 in its  core  History reviewed. No pertinent past medical history.  History reviewed. No pertinent surgical history.  History reviewed. No pertinent family history.  Social History:  reports that she has never smoked. She has never used smokeless tobacco. She reports that she does not drink alcohol or use drugs.  Allergies:  Allergies  Allergen Reactions  . Penicillins Other (See Comments)    Medications: I have reviewed the patient's current medications.  Results for orders placed or performed during the hospital encounter of 11/20/16 (from the past 48 hour(s))  Protime-INR     Status: None   Collection Time: 11/20/16  4:52 PM  Result Value Ref Range   Prothrombin Time 14.1 11.4 - 15.2 seconds   INR 1.09   APTT     Status: None   Collection Time: 11/20/16  4:52 PM  Result Value Ref Range   aPTT 31 24 - 36 seconds  CBC     Status: None   Collection Time: 11/20/16  4:52 PM  Result Value Ref Range   WBC 7.1 4.0 - 10.5 K/uL   RBC 5.04 3.87 - 5.11 MIL/uL   Hemoglobin 14.2 12.0 - 15.0 g/dL   HCT 43.8 36.0 - 46.0 %   MCV 86.9 78.0 - 100.0 fL   MCH 28.2 26.0 - 34.0 pg   MCHC 32.4 30.0 - 36.0 g/dL   RDW 13.3 11.5 - 15.5 %   Platelets 233 150 - 400 K/uL  Differential     Status: None   Collection Time: 11/20/16  4:52 PM  Result Value Ref Range   Neutrophils Relative % 66 %   Neutro Abs 4.7 1.7 - 7.7 K/uL   Lymphocytes Relative 24 %   Lymphs Abs 1.7 0.7 - 4.0 K/uL   Monocytes Relative 9 %   Monocytes Absolute 0.6 0.1 - 1.0 K/uL    Eosinophils Relative 1 %   Eosinophils Absolute 0.1 0.0 - 0.7 K/uL   Basophils Relative 0 %   Basophils Absolute 0.0 0.0 - 0.1 K/uL  Comprehensive metabolic panel     Status: Abnormal   Collection Time: 11/20/16  4:52 PM  Result Value Ref Range   Sodium 139 135 - 145 mmol/L   Potassium 3.6 3.5 - 5.1 mmol/L   Chloride 103 101 - 111 mmol/L   CO2 26 22 - 32 mmol/L   Glucose, Bld 111 (H) 65 - 99 mg/dL   BUN 9 6 - 20 mg/dL   Creatinine, Ser 0.82 0.44 - 1.00 mg/dL   Calcium 9.3 8.9 - 10.3 mg/dL   Total Protein 8.4 (H) 6.5 - 8.1 g/dL   Albumin 3.9 3.5 - 5.0 g/dL   AST 28 15 - 41 U/L   ALT 19 14 - 54 U/L   Alkaline Phosphatase 86 38 - 126 U/L   Total Bilirubin 0.6 0.3 - 1.2 mg/dL   GFR calc non Af Amer >60 >60 mL/min   GFR calc Af Amer >60 >60 mL/min    Comment: (NOTE) The eGFR has been calculated using the  CKD EPI equation. This calculation has not been validated in all clinical situations. eGFR's persistently <60 mL/min signify possible Chronic Kidney Disease.    Anion gap 10 5 - 15  I-stat troponin, ED     Status: None   Collection Time: 11/20/16  5:04 PM  Result Value Ref Range   Troponin i, poc 0.00 0.00 - 0.08 ng/mL   Comment 3            Comment: Due to the release kinetics of cTnI, a negative result within the first hours of the onset of symptoms does not rule out myocardial infarction with certainty. If myocardial infarction is still suspected, repeat the test at appropriate intervals.   I-Stat Chem 8, ED     Status: Abnormal   Collection Time: 11/20/16  5:06 PM  Result Value Ref Range   Sodium 143 135 - 145 mmol/L   Potassium 3.6 3.5 - 5.1 mmol/L   Chloride 104 101 - 111 mmol/L   BUN 11 6 - 20 mg/dL   Creatinine, Ser 6.34 0.44 - 1.00 mg/dL   Glucose, Bld 730 (H) 65 - 99 mg/dL   Calcium, Ion 7.47 (L) 1.15 - 1.40 mmol/L   TCO2 26 0 - 100 mmol/L   Hemoglobin 15.6 (H) 12.0 - 15.0 g/dL   HCT 62.7 72.8 - 93.2 %    Dg Chest 2 View  Result Date: 11/19/2016 CLINICAL  DATA:  Productive cough for the past 2 weeks. Former smoker. EXAM: CHEST  2 VIEW COMPARISON:  Chest x-ray of July 10, 2010 FINDINGS: The lungs are well-expanded. There is no focal infiltrate. There is a pectus excavatum type chest contour. The heart and pulmonary vascularity are normal. The mediastinum is normal in width. There is no pleural effusion. There is moderate dextrocurvature centered in the lower thoracic spine which is stable. IMPRESSION: Mild chronic bronchitic changes, stable. No pneumonia, pulmonary edema, nor other acute cardiopulmonary abnormality. Electronically Signed   By: David  [place] M.D.   On: 11/19/2016 14:26   Ct Head Wo Contrast  Result Date: 11/20/2016 CLINICAL DATA:  Left arm weakness. EXAM: CT HEAD WITHOUT CONTRAST TECHNIQUE: Contiguous axial images were obtained from the base of the skull through the vertex without intravenous contrast. COMPARISON:  None. FINDINGS: Brain: Large amount of edema is identified in the high right frontal parietal region. Appearance is more suggestive of vasogenic edema than cytotoxic edema on this study. Features may be related to a potential mass lesion identified in the high right parietal region measuring 2.2 cm (image 25 series 201). Despite the edema, no subfalcine midline shift is evident. No evidence for hydrocephalus. Vascular: No hyperdense vessel or unexpected calcification. Skull: Normal. Negative for fracture or focal lesion. Sinuses/Orbits: No acute finding. Other: None. IMPRESSION: 1. Relatively large volume of apparent vasogenic edema in the high right frontoparietal region, likely secondary to a 2.2 cm high frontal parietal lobe lesion at the gray-white junction. MRI without and with contrast would be the study of choice to further evaluate. 2. No substantial mass-effect or midline shift. I discussed these findings by telephone with the triage nurse Vicente Serene) at 1809 hours on 05/24/2017. Electronically Signed   By: Kennith Center M.D.    On: 11/20/2016 18:12   Mr [name] JJ Contrast  Result Date: 11/20/2016 CLINICAL DATA:  LEFT-sided weakness for 1 day, assess for stroke. EXAM: MRI HEAD WITHOUT AND WITH CONTRAST TECHNIQUE: Multiplanar, multiecho pulse sequences of the brain and surrounding structures were obtained without and with intravenous  contrast. CONTRAST:  57m MULTIHANCE GADOBENATE DIMEGLUMINE 529 MG/ML IV SOLN COMPARISON:  CT HEAD November 20, 2016 at 1752 hours FINDINGS: INTRACRANIAL CONTENTS: Ring-enhancing RIGHT frontal lobe convexity intraparenchymal 2.2 x 1.6 x 1.9 cm mass extending to the dura, central subcentimeter susceptibility artifact which could reflect mineralization or hemosiderin. Corresponding reduced diffusion and low ADC values. At least 4 additional subcentimeter satellite nodules anterior to the dominant lesion in the RIGHT frontal lobe juxta cortical white matter. Extensive surrounding T2 bright vasogenic edema and regional sulcal effacement. No midline shift. Ventricles and sulci are normal for patient's age. Patchy supratentorial white matter FLAIR T2 hyperintensities exclusive of the aforementioned abnormality compatible with mild to moderate chronic small vessel ischemic disease. No abnormal extra-axial enhancement. VASCULAR: Normal major intracranial vascular flow voids present at skull base. SKULL AND UPPER CERVICAL SPINE: No abnormal sellar expansion. No suspicious calvarial bone marrow signal. Craniocervical junction maintained. SINUSES/ORBITS: The mastoid air-cells and included paranasal sinuses are well-aerated.The included ocular globes and orbital contents are non-suspicious. OTHER: None. IMPRESSION: Dominant 2.2 x 1.6 x 1.9 cm RIGHT frontal lobe mass with subcentimeter satellite nodules within extensive vasogenic edema. 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 No infarct.  Mild chronic small vessel ischemic disease.  Acute findings discussed with and reconfirmed by Dr.ROBERT LOCKWOOD on 11/20/2016 at 8:25 pm. Electronically Signed   By: CElon AlasM.D.   On: 11/20/2016 20:28    Review of Systems  HENT: Negative.   Eyes: Negative.   Respiratory: Negative.   Cardiovascular: Negative.   Gastrointestinal: Negative.   Genitourinary: Negative.   Musculoskeletal: Negative.   Skin: Negative.   Neurological: Positive for focal weakness and weakness.  Endo/Heme/Allergies: Negative.   Psychiatric/Behavioral: Negative.    Blood pressure 159/76, pulse 63, temperature 98.7 F (37.1 C), temperature source Oral, resp. rate 19, SpO2 99 %. Physical Exam  Constitutional: She is oriented to person, place, and time. She appears well-developed and well-nourished. No distress.  HENT:  Head: Normocephalic.  Right Ear: External ear normal.  Left Ear: External ear normal.  Nose: Nose normal.  Mouth/Throat: Oropharynx is clear and moist. No oropharyngeal exudate.  Eyes: Conjunctivae and EOM are normal. Pupils are equal, round, and reactive to light.  Neck: Normal range of motion. Neck supple.  Cardiovascular: Normal rate, regular rhythm, normal heart sounds and intact distal pulses.   Respiratory: Effort normal and breath sounds normal.  GI: Soft. Bowel sounds are normal.  Musculoskeletal:  Weakness left upper extremity, left lower extremity lue>lle  Neurological: She is alert and oriented to person, place, and time. She has normal reflexes. A cranial nerve deficit is present. No sensory deficit. GCS eye subscore is 4. GCS verbal subscore is 5. GCS motor subscore is 6. She displays no Babinski's sign on the right side. She displays no Babinski's sign on the left side.  Mild left facial weakness on activation Weakness left deltoid, biceps 4-/5, 4/5 triceps, grip, intrinsics Normal strength right upper extremity Mild weakness left lower extremity 5-/5 Perrl, tongue and uvula midline   Skin: She is not  diaphoretic.    Assessment/Plan: 76year old with newly discovered brain mass causing weakness on the left side. Will treat with decadron, and if doing well will discharge tomorrow and arrange for an elective craniotomy to obtain tissue for diagnosis. I will add keppra to her orders.   Achille Xiang L 11/20/2016, 10:56 PM

## 2016-11-20 NOTE — ED Notes (Signed)
Patient in MRI 

## 2016-11-20 NOTE — ED Notes (Signed)
Neuro at bedside.

## 2016-11-21 ENCOUNTER — Observation Stay (HOSPITAL_COMMUNITY): Payer: Medicare Other

## 2016-11-21 ENCOUNTER — Other Ambulatory Visit: Payer: Self-pay | Admitting: Neurosurgery

## 2016-11-21 DIAGNOSIS — Z880 Allergy status to penicillin: Secondary | ICD-10-CM | POA: Diagnosis not present

## 2016-11-21 DIAGNOSIS — Z87891 Personal history of nicotine dependence: Secondary | ICD-10-CM

## 2016-11-21 DIAGNOSIS — G9389 Other specified disorders of brain: Secondary | ICD-10-CM | POA: Diagnosis not present

## 2016-11-21 MED ORDER — IOPAMIDOL (ISOVUE-300) INJECTION 61%
INTRAVENOUS | Status: AC
  Filled 2016-11-21: qty 75

## 2016-11-21 MED ORDER — DEXAMETHASONE 4 MG PO TABS: 4.0000 mg | ORAL_TABLET | Freq: Four times a day (QID) | ORAL | 0 refills | Status: DC

## 2016-11-21 MED ORDER — LEVETIRACETAM 500 MG PO TABS
500.0000 mg | ORAL_TABLET | Freq: Two times a day (BID) | ORAL | 1 refills | Status: DC
Start: 2016-11-21 — End: 2017-01-16

## 2016-11-21 MED ORDER — LEVETIRACETAM 500 MG PO TABS: 500.0000 mg | ORAL_TABLET | Freq: Two times a day (BID) | ORAL | 1 refills | Status: DC

## 2016-11-21 MED ORDER — LEVETIRACETAM 500 MG PO TABS
500.0000 mg | ORAL_TABLET | Freq: Two times a day (BID) | ORAL | Status: DC
  Administered 2016-11-21: 500 mg via ORAL
  Filled 2016-11-21: qty 1

## 2016-11-21 MED ORDER — DEXAMETHASONE 4 MG PO TABS: 4.0000 mg | ORAL_TABLET | Freq: Two times a day (BID) | ORAL | 0 refills | Status: DC

## 2016-11-21 NOTE — Progress Notes (Signed)
Patient ID: Donna Valentine, female   DOB: 1941-10-02, 76 y.o.   MRN: [id number]  BP (!) 130/54 (BP Location: Right Arm)   Pulse 64   Temp 98.2 F (36.8 C) (Oral)   Resp 18   Ht 5\' 6"  (1.676 m)   Wt 80.1 kg (176 lb 9.6 oz)   SpO2 96%   BMI 28.50 kg/m  Alert and oriented x 4, speech is clear and fluent Moving all extremities, remains plegic on left side, though no worse than 4-/5 in left upper extremity Plan on craniotomy for next week Thursday,family notified.

## 2016-11-21 NOTE — Progress Notes (Signed)
Patient is discharged from room 5M12 at this time. Alert and in stable condition. IV site d/c'd as well as tele. Instructions read to patient and family with understanding verbalized. Left unit via wheelchair with family and all belongings at side.

## 2016-11-21 NOTE — Discharge Summary (Signed)
Name: Donna Valentine MRN: [id number] DOB: 04-21-1941 76 y.o. PCP: Sharilyn Sites, MD  Date of Admission: 11/20/2016  6:29 PM Date of Discharge: 11/21/2016 Attending Physician: Axel Filler, MD  Discharge Diagnosis: 1. Right frontal lobe mass Principal Problem:   GBM (glioblastoma multiforme) (HCC) Active Problems:   Left-sided weakness   Discharge Medications: Allergies as of 11/21/2016      Reactions   Penicillins Other (See Comments)      Medication List    TAKE these medications   dexamethasone 4 MG tablet Commonly known as:  DECADRON Take 1 tablet (4 mg total) by mouth every 6 (six) hours. Every 6 hours on Sat; every 8 hours on Sun/Mon, every 12 hours on Tues/Wed   levETIRAcetam 500 MG tablet Commonly known as:  KEPPRA Take 1 tablet (500 mg total) by mouth 2 (two) times daily.       Disposition and follow-up:   [name] was discharged from [facility] in Stable condition.  At the hospital follow up visit please address:  1.   Is she having improvement or worsening of her left sided weakness? Is she having worsening headaches? Any nausea, vomiting, vision or hearing changes, or new focal weakness? Has she followed up with neurosurgery?  2.  Labs / imaging needed at time of follow-up: Possibly CT head vs MRI depending on Neurosurgery reccs post-op. Depending on biopsy results, may need further imaging to determine primary tumor or mets.  3.  Pending labs/ test needing follow-up: Brain mass biopsy is planned for 11/27/16 - f/u results  Follow-up Appointments: Follow-up Information    CABBELL,KYLE L, MD Follow up on 11/27/2016.   Specialty:  Neurosurgery Why:  you will be called and given the information about your upcoming operation next week Thursday.  Contact information: [address] [place] [address] [phone]           Hospital Course by problem list: Principal Problem:   GBM  (glioblastoma multiforme) ([place]) Active Problems:   Left-sided weakness   Right frontal lobe brain mass: Patient presented to [facility] with 2 day history of left sided weakness which was concerning for CVA. Imaging in the ED showed a 2x2cm right frontal lobe lesion with vasogenic edema. MRI brain was consistent with this as well as two subcentimeter satellite nodules. Patient was evaluated by neurosurgery in the ED, started on Keppra for seizure prophylaxis and Decadron for control of the vasogenic edema. By the next day, patient had some improvement in her weakness (LLE was 5/5, LUE 4/5), had only a mild headache that was controlled with tylenol and otherwise was asymptomatic. Patient had a remote smoking history of about 7 pack years >25 years prior, has had normal mammograms (though last one was >76yrs prior) with no changes per patient, normal pap smears up until age 29 (with no concerning changes or symptoms per patient), and had a last colonoscopy in 2005. We thoroughly evaluated patient for signs of possible extracranial primary tumor - no lymphadenopathy, breast exam did no reveal nodules, discharge or skin changes, and thorough skin exam showed diffuse macules, seborrheic keratoses, and few actinic keratoses with no highly suspicious lesions noted. New brain mass differential includes glioblastoma, CNS lymphoma and metastasis (though currently there does not appear to be signs of an extracranial tumor). Neurosurgery plans for elective craniotomy with biopsy on 11/27/2016. On day of discharge, patient was in stable condition with improving weakness and otherwise no concerning symptoms. Neurosurgery and  patient/family were in agreement with discharge prior to elective surgery next week. She was discharged on a decadron taper leading up to her surgery date and instructed to return to the ED if symptoms were to worsen or new symptoms were to arise.   Discharge Vitals:   BP (!) 130/54 (BP Location: Right Arm)    Pulse 64   Temp 98.2 F (36.8 C) (Oral)   Resp 18   Ht 5\' 6"  (1.676 m)   Wt 176 lb 9.6 oz (80.1 kg)   SpO2 96%   BMI 28.50 kg/m   Pertinent Labs, Studies, and Procedures:  CBC    Component Value Date/Time   WBC 7.1 11/20/2016 1652   RBC 5.04 11/20/2016 1652   HGB 15.6 (H) 11/20/2016 1706   HCT 46.0 11/20/2016 1706   PLT 233 11/20/2016 1652   MCV 86.9 11/20/2016 1652   MCH 28.2 11/20/2016 1652   MCHC 32.4 11/20/2016 1652   RDW 13.3 11/20/2016 1652   LYMPHSABS 1.7 11/20/2016 1652   MONOABS 0.6 11/20/2016 1652   EOSABS 0.1 11/20/2016 1652   BASOSABS 0.0 11/20/2016 1652   CMP Latest Ref Rng & Units 11/20/2016 11/20/2016 07/10/2010  Glucose 65 - 99 mg/dL 111(H) 111(H) 89  BUN 6 - 20 mg/dL 11 9 13   Creatinine 0.44 - 1.00 mg/dL 0.80 0.82 0.8  Sodium 135 - 145 mmol/L 143 139 142  Potassium 3.5 - 5.1 mmol/L 3.6 3.6 4.5  Chloride 101 - 111 mmol/L 104 103 104  CO2 22 - 32 mmol/L - 26 31  Calcium 8.9 - 10.3 mg/dL - 9.3 9.3  Total Protein 6.5 - 8.1 g/dL - 8.4(H) 8.1  Total Bilirubin 0.3 - 1.2 mg/dL - 0.6 0.6  Alkaline Phos 38 - 126 U/L - 86 90  AST 15 - 41 U/L - 28 23  ALT 14 - 54 U/L - 19 21   APTT 11/20/2016: 31 PT/INR 11/20/2016: 14.1/1.09  CT head wo contrast 11/20/2016: 1. Relatively large volume of apparent vasogenic edema in the high right frontoparietal region, likely secondary to a 2.2 cm high frontal parietal lobe lesion at the gray-white junction. MRI without and with contrast would be the study of choice to further evaluate. 2. No substantial mass-effect or midline shift.  MRI brain w/wo contrast 11/20/2016: -Dominant 2.2 x 1.6 x 1.9 cm RIGHT frontal lobe mass with subcentimeter satellite nodules within extensive vasogenic edema. 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 -No infarct.  Mild chronic small vessel ischemic disease  CT head w contrast 11/21/2016: 1. Study for stereotactic surgical  planning. 2. Enhancing mass in the right superior frontal gyrus is on series 3, image 138. Surrounding white matter hypodensity most suggestive of vasogenic edema is stable with mild regional mass effect but no midline shift. 3. No new intracranial abnormality  Discharge Instructions: Discharge Instructions    Call MD for:  difficulty breathing, headache or visual disturbances    Complete by:  As directed    Call MD for:  extreme fatigue    Complete by:  As directed    Call MD for:  persistant dizziness or light-headedness    Complete by:  As directed    Call MD for:  persistant nausea and vomiting    Complete by:  As directed    Call MD for:  severe uncontrolled pain    Complete by:  As directed    Call MD for:  temperature >100.4  Complete by:  As directed    Diet - low sodium heart healthy    Complete by:  As directed    Discharge instructions    Complete by:  As directed    Please take Keppra twice a day.  Continue to take the steroid, Decadron (or Dexamethasone) as indicated below until your surgery on Thursday. -Continue every 6 hours today and tomorrow (Saturday) -take every 8 hours on Sunday and Monday -take every 12 hours on Tuesday and Wednesday   If you start getting worsening headaches not controlled by tylenol, nausea, vomiting, worsening weakness, then please return to the ED for evaluation.   It was very nice meeting you and your family! Good luck on your surgery on Thursday.   Increase activity slowly    Complete by:  As directed       Signed: Alphonzo Grieve, MD 11/23/2016, 1:57 PM   Pager [phone]

## 2016-11-21 NOTE — Progress Notes (Signed)
Patient arrived to unit via ED stretcher with husband and belongings at bedside.  Patient used bedpan at this time. Vitals stable. Tele applied and verified. Patient and husband oriented to unit/room. Admission completed. Continue to monitor patient.

## 2016-11-21 NOTE — Care Management Note (Signed)
Case Management Note  Patient Details  Name: Donna Valentine MRN: [id number] Date of Birth: 25-Aug-1941  Subjective/Objective:        Patient presents with acute onset L weakness found to have a large brain mass.  Lives at home with spouse. CM will follow for discharge needs pending patient's progress and physician orders.   Action/Plan:   Expected Discharge Date:                  Expected Discharge Plan:     In-House Referral:     Discharge planning Services     Post Acute Care Choice:    Choice offered to:     DME Arranged:    DME Agency:     HH Arranged:    HH Agency:     Status of Service:     If discussed at [employer] of Stay Meetings, dates discussed:    Additional Comments:  Rolm Baptise, RN 11/21/2016, 10:08 AM

## 2016-11-21 NOTE — Progress Notes (Signed)
   Subjective:  Patient states that she feels her strength is improving and she is overall feeling better. She had a mild frontal headache this morning that responded well to acetaminophen. She is having some increased jitteriness and shakiness with steroids.   On discussion about cancer risks and history, patient states she has been getting regular mammograms with no worrisome findings; last one was about 2 years ago; she denies noticing lumps, skin changes, or discharge. Patient endorses non-concerning prior pap smears; she denies vaginal discharge, bleeding, discomfort. She denies lymphadenopathy. She endorses multiple moles but none that are concerning to her; she denies prior history of skin cancers. She endorses a benign brain tumor that her mother had; otherwise no other family history of cancer.   Objective:  Vital signs in last 24 hours: Vitals:   11/21/16 0016 11/21/16 0558 11/21/16 1012 11/21/16 1350  BP: (!) 164/68 124/60 (!) 143/71 (!) 130/54  Pulse: 62 65 80 64  Resp: 16 16 18 18   Temp: 99.1 F (37.3 C) 97.8 F (36.6 C) 98 F (36.7 C) 98.2 F (36.8 C)  TempSrc: Oral Oral Oral Oral  SpO2: 96% 95% 97% 96%  Weight: 176 lb 9.6 oz (80.1 kg)     Height: 5\' 6"  (1.676 m)      Constitutional: NAD, sitting up in bed comfortably CV: RRR, no murmurs rubs or gallops Resp: CTAB, no increased work of breathing Abd: soft, NDNT Lymph: no appreciable head/neck, axillar, or inguinal lymphadenopathy Skin: multiple nevi, lentigines, and SK's diffusely. No scalp or ear lesions; no oropharyngeal lesions. Clear nail beds.  Neuro: CN 2-12 intact; RUE 5/5, LUE 4/5; RLE 5/5, LLE 4/5  Assessment/Plan:  Principal Problem:   GBM (glioblastoma multiforme) (HCC) Active Problems:   Left-sided weakness  Right frontal lobe mass: Patient with newly found right sided brain mass with possible satellite nodules; she has improving left sided strength. Discussed with neurosurgery who is in agreement  with discharge today as patient appears stable and improving. She is scheduled for surgery on 2/15. Repeat CT today is unchanged.  --decadron taper until surgery --keppra 500mg  BID  --craniotomy with biopsy on 2/15  HTN: Intermittent; on last check was 134/54. --f/u with PCP for further monitoring   Dispo: Anticipated discharge today.   Alphonzo Grieve, MD 11/21/2016, 1:57 PM Pager [phone]

## 2016-11-21 NOTE — Evaluation (Addendum)
Physical Therapy Evaluation Patient Details Name: Donna Valentine MRN: [id number] DOB: 1941-02-15 Today's Date: 11/21/2016   History of Present Illness  76 y.o. female with a h/o of HLD, PACs, and borderline HTN who presents with acute onset of L sided weakness and numbness for 1 day. 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  Clinical Impression  Pt has decreased strength and some neglect in LUE, distally more so than proximally. She reports LUE strength is somewhat improved since hospital admission.  Instructed pt and her daughter in home exercise program for LUE strengthening and encouraged functional use of LUE. Noted plan is to DC today with neuro following for workup of R frontal lobe lesion. From PT standpoint she is ready to DC home.     Follow Up Recommendations No PT follow up (pt/family to request PT consult if LUE weakness persists or worsens following medical workup)    Equipment Recommendations  None recommended by PT    Recommendations for Other Services       Precautions / Restrictions Precautions Precautions: None Precaution Comments: no falls in past 1 year Restrictions Weight Bearing Restrictions: No      Mobility  Bed Mobility Overal bed mobility: Independent                Transfers Overall transfer level: Needs assistance Equipment used: None Transfers: Sit to/from Stand Sit to Stand: Min guard         General transfer comment: min guard for safety, no physical assist needed, no LOB  Ambulation/Gait Ambulation/Gait assistance: Supervision Ambulation Distance (Feet): 250 Feet Assistive device: None Gait Pattern/deviations: WFL(Within Functional Limits) Gait velocity: WFL   General Gait Details: no LOB with head turns, steady, pt reports LLE "feels heavy" but no foot drop noted, pt bumped LUE into an obstacle in hall, tends to neglect LUE, she reports vision is normal and was able to read  Stairs             Wheelchair Mobility    Modified Rankin (Stroke Patients Only)       Balance Overall balance assessment: Independent                                           Pertinent Vitals/Pain Pain Assessment: No/denies pain    Home Living Family/patient expects to be discharged to:: Private residence Living Arrangements: Spouse/significant other Available Help at Discharge: Family;Available 24 hours/day Type of Home: House Home Access: Stairs to enter Entrance Stairs-Rails: None Entrance Stairs-Number of Steps: 3 Home Layout: One level Home Equipment: None      Prior Function Level of Independence: Independent         Comments: drives, very active     Hand Dominance   Dominant Hand: Right    Extremity/Trunk Assessment   Upper Extremity Assessment Upper Extremity Assessment: LUE deficits/detail LUE Deficits / Details: shoulder elevation AROM WNL, strength -4/5; elbow AROM WNL, flexion +3/5, ext +3/5, wrist ext 2/5 PROM WNL, grip 2/5; sensation intact to light touch, some neglect of LUE noted    Lower Extremity Assessment Lower Extremity Assessment: LLE deficits/detail LLE Deficits / Details: +4/5 ankle PF/DF, +4/5 knee ext    Cervical / Trunk Assessment Cervical / Trunk Assessment: Normal  Communication   Communication: No difficulties  Cognition Arousal/Alertness: Awake/alert Behavior During Therapy: WFL for tasks assessed/performed Overall Cognitive Status: Within Functional Limits for tasks  assessed                      General Comments      Exercises Other Exercises Other Exercises: instructed pt/family in L active shoulder flexion, gripping rolled towel, encouraged functional use of LUE   Assessment/Plan    PT Assessment All further PT needs can be met in the next venue of care  PT Problem List Decreased strength          PT Treatment Interventions      PT Goals (Current goals can be found in the Care Plan section)   Acute Rehab PT Goals Patient Stated Goal: likes to shop and go out to eat with [employer] PT Goal Formulation: All assessment and education complete, DC therapy    Frequency     Barriers to discharge        Co-evaluation               End of Session Equipment Utilized During Treatment: Gait belt Activity Tolerance: Patient tolerated treatment well Patient left: in chair;with call bell/phone within reach;with family/visitor present Nurse Communication: Mobility status    Functional Assessment Tool Used: clinical judgement Functional Limitation: Carrying, moving and handling objects Carrying, Moving and Handling Objects Current Status (K3276): At least 60 percent but less than 80 percent impaired, limited or restricted Carrying, Moving and Handling Objects Goal Status [phone]): At least 60 percent but less than 80 percent impaired, limited or restricted Carrying, Moving and Handling Objects Discharge Status [phone]): At least 60 percent but less than 80 percent impaired, limited or restricted    Time: 0945-1014 PT Time Calculation (min) (ACUTE ONLY): 29 min   Charges:   PT Evaluation $PT Eval Moderate Complexity: 1 Procedure PT Treatments $Gait Training: 8-22 mins   PT G Codes:   PT G-Codes **NOT FOR INPATIENT CLASS** Functional Assessment Tool Used: clinical judgement Functional Limitation: Carrying, moving and handling objects Carrying, Moving and Handling Objects Current Status (B3403): At least 60 percent but less than 80 percent impaired, limited or restricted Carrying, Moving and Handling Objects Goal Status [phone]): At least 60 percent but less than 80 percent impaired, limited or restricted Carrying, Moving and Handling Objects Discharge Status [phone]): At least 60 percent but less than 80 percent impaired, limited or restricted    Philomena Doheny 11/21/2016, 10:31 AM [phone]

## 2016-11-21 NOTE — Care Management Obs Status (Signed)
[place] NOTIFICATION   Patient Details  Name: Donna Valentine MRN: [id number] Date of Birth: 1941-09-12   Medicare Observation Status Notification Given:  Yes    Pollie Friar, RN 11/21/2016, 2:34 PM

## 2016-11-25 ENCOUNTER — Encounter (HOSPITAL_COMMUNITY): Payer: Self-pay

## 2016-11-25 ENCOUNTER — Encounter (HOSPITAL_COMMUNITY)
Admission: RE | Admit: 2016-11-25 | Discharge: 2016-11-25 | Disposition: A | Discharge status: Home / Self Care | Payer: Medicare Other | Source: Ambulatory Visit | Attending: Neurosurgery | Admitting: Neurosurgery

## 2016-11-25 DIAGNOSIS — Z01812 Encounter for preprocedural laboratory examination: Secondary | ICD-10-CM | POA: Insufficient documentation

## 2016-11-25 DIAGNOSIS — C719 Malignant neoplasm of brain, unspecified: Secondary | ICD-10-CM | POA: Insufficient documentation

## 2016-11-25 LAB — TYPE AND SCREEN
ABO/RH(D): O POS
ANTIBODY SCREEN: NEGATIVE

## 2016-11-25 LAB — ABO/RH
ABO/RH(D): O POS
Weak D: POSITIVE

## 2016-11-25 NOTE — Progress Notes (Signed)
Pt. reports that she had an ECHO with a [facility] group, sent by [facility]. For ? Heart murmur.  She reports that she was informed that there wasn't anything abnormal.  Pt. With continued L sided weakness. Labs done in ED 11/20/2016

## 2016-11-25 NOTE — Pre-Procedure Instructions (Signed)
Donna Valentine  11/25/2016      [id number] [place], [place] [id number] [place] [id number] Phone: [phone] Fax: [phone]    Your procedure is scheduled on Friday February 16  Report to [facility] Admitting at 6:00 A.M.  Call this number if you have problems the morning of surgery:  [phone]   Remember:  Do not eat food or drink liquids after midnight.  Take these medicines the morning of surgery with A SIP OF WATER: levetiracetam (Keppra), loratadine (claritin) if needed  7 days prior to surgery STOP taking any Aspirin, Aleve, Naproxen, Ibuprofen, Motrin, Advil, Goody's, BC's, all herbal medications, fish oil, and all vitamins    Do not wear jewelry, make-up or nail polish.   Do not wear lotions, powders, or perfumes, or deoderant.   Do not shave 48 hours prior to surgery.    Do not bring valuables to the hospital.   [facility] is not responsible for any belongings or valuables.  Contacts, dentures or bridgework may not be worn into surgery.  Leave your suitcase in the car.  After surgery it may be brought to your room.  For patients admitted to the hospital, discharge time will be determined by your treatment team.  Patients discharged the day of surgery will not be allowed to drive home.    Special instructions:    [facility]- Preparing For Surgery  Before surgery, you can play an important role. Because skin is not sterile, your skin needs to be as free of germs as possible. You can reduce the number of germs on your skin by washing with CHG (chlorahexidine gluconate) Soap before surgery.  CHG is an antiseptic cleaner which kills germs and bonds with the skin to continue killing germs even after washing.  Please do not use if you have an allergy to CHG or antibacterial soaps. If your skin becomes reddened/irritated stop using the CHG.  Do not shave (including legs and underarms) for  at least 48 hours prior to first CHG shower. It is OK to shave your face.  Please follow these instructions carefully.   1. Shower the NIGHT BEFORE SURGERY and the MORNING OF SURGERY with CHG.   2. If you chose to wash your hair, wash your hair first as usual with your normal shampoo.  3. After you shampoo, rinse your hair and body thoroughly to remove the shampoo.  4. Use CHG as you would any other liquid soap. You can apply CHG directly to the skin and wash gently with a scrungie or a clean washcloth.   5. Apply the CHG Soap to your body ONLY FROM THE NECK DOWN.  Do not use on open wounds or open sores. Avoid contact with your eyes, ears, mouth and genitals (private parts). Wash genitals (private parts) with your normal soap.  6. Wash thoroughly, paying special attention to the area where your surgery will be performed.  7. Thoroughly rinse your body with warm water from the neck down.  8. DO NOT shower/wash with your normal soap after using and rinsing off the CHG Soap.  9. Pat yourself dry with a CLEAN TOWEL.   10. Wear CLEAN PAJAMAS   11. Place CLEAN SHEETS on your bed the night of your first shower and DO NOT SLEEP WITH PETS.    Day of Surgery: Do not apply any deodorants/lotions. Please wear clean clothes to the hospital/surgery center.

## 2016-11-26 NOTE — Progress Notes (Addendum)
Anesthesia chart review: Patient is a 76 year old female scheduled for right craniotomy for tumor with [facility] on 11/28/2016 by Dr. Christella Noa.  History includes former smoker (quit '93), appendectomy. She presented to the ED on 11/20/16 with a two day history of left sided weakness concerning for CVA; however, imaging showed right frontal brain lesion with vasogenic edema. Decadron was started for vasogenic edema and Keppra for seizure prophylaxis. Neurosurgery was consulted with the above procedure recommended.  She was seen by cardiologist Dr. Almira Coaster ([place] in 2015 and 2016 for evaluation of a low grade murmur and right BBB. Last echo on 07/2015 showed mild MR/AI with probably echo follow-up in 2 years anticipated. The [facility] office is no longer open.)  Meds include Decadron, Keppra, Claritin.  PCP is with [facility].  BP (!) 143/64   Pulse 63   Temp 36.3 C   Resp 18   Ht 5\' 6"  (1.676 m)   Wt 176 lb (79.8 kg)   SpO2 96%   BMI 28.41 kg/m   EKG 11/20/16: NSR, right BBB, possible lateral infarct (age undetermined), cannot rule out inferior infarct (age undetermined). Comparison EKG tracing obtained from [facility] from 07/27/14 and 08/02/15. Overall, I think tracing is not significantly changed when compared to 08/02/15 tracing.   Echo 08/02/15 ([facility]): Summary: The left ventricle is normal in size. There is normal left ventricular wall thickness. The left ventricular wall motion is normal. Left ventricular diastolic function is abnormal. The left ventricular ejection fraction is normal, 55-60%. Mild aortic sclerosis is present with good valvular opening. There is mild 1+ aortic regurgitation. There is mild 1+ mitral regurgitation. Trace tricuspid regurgitation. Pulmonary hypertension is not suggested by Doppler findings. Compared to prior study, there is no significant change.  There were not prior cardiac studies at  [facility]).  Head CT 11/21/16: IMPRESSION: 1. Study for stereotactic surgical planning. 2. Enhancing mass in the right superior frontal gyrus is on series 3, image 138. Surrounding white matter hypodensity most suggestive of vasogenic edema is stable with mild regional mass effect but no midline shift. 3. No new intracranial abnormality.  Brain MRI 11/20/16: IMPRESSION: Dominant 2.2 x 1.6 x 1.9 cm RIGHT frontal lobe mass with subcentimeter satellite nodules within extensive vasogenic edema. 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 No infarct.  Mild chronic small vessel ischemic disease.  CXR 11/19/16: IMPRESSION: Mild chronic bronchitic changes, stable. No pneumonia, pulmonary edema, nor other acute cardiopulmonary abnormality.  Labs from 11/20/16 noted. NA 139, K 3.6, glucose 111, Cr 0.80-0.82. HGB 14.2-15.6, HCT 43.8-46.0. PT/PTT WNL. T&S done on 11/25/16.   Overall, I think her EKG is stable since at least 07/2015. (Patient was seeing cardiologist at that time. Echo repeated as outlined above, but otherwise no additional cardiac testing ordered.) She now needs surgery for a brain tumor. No CV symptoms documented from her recent PAT visit or recent admission. If no acute changes then I would anticipate that she could proceed as planned.  [facility] Short Stay Center/Anesthesiology Phone [phone] 11/27/2016 10:52 AM

## 2016-11-27 ENCOUNTER — Encounter (HOSPITAL_COMMUNITY): Payer: Self-pay | Admitting: Anesthesiology

## 2016-11-27 MED ORDER — VANCOMYCIN HCL 10 G IV SOLR
1250.0000 mg | INTRAVENOUS | Status: AC
  Administered 2016-11-28: 1250 mg via INTRAVENOUS
  Filled 2016-11-27: qty 1250

## 2016-11-27 NOTE — Anesthesia Preprocedure Evaluation (Addendum)
Anesthesia Evaluation  Patient identified by MRN, date of birth, ID band Patient awake    Reviewed: Allergy & Precautions, NPO status , Patient's Chart, lab work & pertinent test results  Airway Mallampati: II  TM Distance: >3 FB Neck ROM: Full    Dental  (+) Teeth Intact, Dental Advisory Given   Pulmonary former smoker,    breath sounds clear to auscultation       Cardiovascular + Peripheral Vascular Disease  + dysrhythmias  Rhythm:Regular Rate:Normal     Neuro/Psych negative neurological ROS  negative psych ROS   GI/Hepatic negative GI ROS, Neg liver ROS,   Endo/Other  negative endocrine ROS  Renal/GU negative Renal ROS     Musculoskeletal negative musculoskeletal ROS (+)   Abdominal   Peds  Hematology negative hematology ROS (+)   Anesthesia Other Findings - HLD  Reproductive/Obstetrics negative OB ROS                            Lab Results  Component Value Date   WBC 7.1 11/20/2016   HGB 15.6 (H) 11/20/2016   HCT 46.0 11/20/2016   MCV 86.9 11/20/2016   PLT 233 11/20/2016   Lab Results  Component Value Date   CREATININE 0.80 11/20/2016   BUN 11 11/20/2016   NA 143 11/20/2016   K 3.6 11/20/2016   CL 104 11/20/2016   CO2 26 11/20/2016   Lab Results  Component Value Date   INR 1.09 11/20/2016   11/2016 EKG: NSR  Anesthesia Physical Anesthesia Plan  ASA: II  Anesthesia Plan: General   Post-op Pain Management:    Induction: Intravenous  Airway Management Planned: Oral ETT  Additional Equipment: Arterial line  Intra-op Plan:   Post-operative Plan: Possible Post-op intubation/ventilation  Informed Consent: I have reviewed the patients History and Physical, chart, labs and discussed the procedure including the risks, benefits and alternatives for the proposed anesthesia with the patient or authorized representative who has indicated his/her understanding and  acceptance.   Dental advisory given  Plan Discussed with: CRNA  Anesthesia Plan Comments: (2 large bore IV's, Arterial Line, no CVC)        Anesthesia Quick Evaluation

## 2016-11-28 ENCOUNTER — Encounter (HOSPITAL_COMMUNITY): Payer: Self-pay | Admitting: *Deleted

## 2016-11-28 ENCOUNTER — Inpatient Hospital Stay (HOSPITAL_COMMUNITY)
Admission: AD | Admit: 2016-11-28 | Discharge: 2016-12-02 | DRG: 027 | Disposition: A | Discharge status: Home / Self Care | Payer: Medicare Other | Source: Ambulatory Visit | Attending: Neurosurgery | Admitting: Neurosurgery

## 2016-11-28 ENCOUNTER — Ambulatory Visit (HOSPITAL_COMMUNITY): Payer: Medicare Other | Admitting: Anesthesiology

## 2016-11-28 ENCOUNTER — Encounter (HOSPITAL_COMMUNITY)
Admission: AD | Disposition: A | Discharge status: Home / Self Care | Payer: Self-pay | Source: Ambulatory Visit | Attending: Neurosurgery

## 2016-11-28 ENCOUNTER — Ambulatory Visit (HOSPITAL_COMMUNITY): Payer: Medicare Other | Admitting: Vascular Surgery

## 2016-11-28 DIAGNOSIS — G8324 Monoplegia of upper limb affecting left nondominant side: Secondary | ICD-10-CM | POA: Diagnosis present

## 2016-11-28 DIAGNOSIS — Z87891 Personal history of nicotine dependence: Secondary | ICD-10-CM | POA: Diagnosis not present

## 2016-11-28 DIAGNOSIS — Z880 Allergy status to penicillin: Secondary | ICD-10-CM

## 2016-11-28 DIAGNOSIS — R402363 Coma scale, best motor response, obeys commands, at hospital admission: Secondary | ICD-10-CM | POA: Diagnosis present

## 2016-11-28 DIAGNOSIS — R402143 Coma scale, eyes open, spontaneous, at hospital admission: Secondary | ICD-10-CM | POA: Diagnosis present

## 2016-11-28 DIAGNOSIS — I739 Peripheral vascular disease, unspecified: Secondary | ICD-10-CM | POA: Diagnosis present

## 2016-11-28 DIAGNOSIS — C711 Malignant neoplasm of frontal lobe: Principal | ICD-10-CM | POA: Diagnosis present

## 2016-11-28 DIAGNOSIS — R402253 Coma scale, best verbal response, oriented, at hospital admission: Secondary | ICD-10-CM | POA: Diagnosis present

## 2016-11-28 DIAGNOSIS — D496 Neoplasm of unspecified behavior of brain: Secondary | ICD-10-CM | POA: Diagnosis present

## 2016-11-28 DIAGNOSIS — Z7189 Other specified counseling: Secondary | ICD-10-CM

## 2016-11-28 HISTORY — PX: APPLICATION OF CRANIAL NAVIGATION: SHX6578

## 2016-11-28 HISTORY — PX: CRANIOTOMY: SHX93

## 2016-11-28 SURGERY — CRANIOTOMY TUMOR EXCISION
Anesthesia: General | Laterality: Right

## 2016-11-28 MED ORDER — SODIUM CHLORIDE 0.9 % IV SOLN
INTRAVENOUS | Status: DC | PRN
  Administered 2016-11-28: 11:00:00 via INTRAVENOUS

## 2016-11-28 MED ORDER — LACTATED RINGERS IV SOLN
INTRAVENOUS | Status: DC | PRN
  Administered 2016-11-28: 07:00:00 via INTRAVENOUS

## 2016-11-28 MED ORDER — DEXAMETHASONE 6 MG PO TABS
6.0000 mg | ORAL_TABLET | Freq: Four times a day (QID) | ORAL | Status: AC
  Administered 2016-11-28 – 2016-11-29 (×4): 6 mg via ORAL
  Filled 2016-11-28 (×4): qty 1

## 2016-11-28 MED ORDER — CHLORHEXIDINE GLUCONATE CLOTH 2 % EX PADS: 6.0000 | MEDICATED_PAD | Freq: Once | CUTANEOUS | Status: DC

## 2016-11-28 MED ORDER — ONDANSETRON HCL 4 MG/2ML IJ SOLN
INTRAMUSCULAR | Status: DC | PRN
Start: 2016-11-28 — End: 2016-11-28
  Administered 2016-11-28 (×2): 4 mg via INTRAVENOUS

## 2016-11-28 MED ORDER — DEXAMETHASONE SODIUM PHOSPHATE 10 MG/ML IJ SOLN
INTRAMUSCULAR | Status: AC
  Filled 2016-11-28: qty 1

## 2016-11-28 MED ORDER — LIDOCAINE HCL (CARDIAC) 20 MG/ML IV SOLN
INTRAVENOUS | Status: DC | PRN
  Administered 2016-11-28: 60 mg via INTRAVENOUS

## 2016-11-28 MED ORDER — FENTANYL CITRATE (PF) 100 MCG/2ML IJ SOLN: 25.0000 ug | INTRAMUSCULAR | Status: DC | PRN

## 2016-11-28 MED ORDER — HYDROCODONE-ACETAMINOPHEN 5-325 MG PO TABS
1.0000 | ORAL_TABLET | ORAL | Status: DC | PRN
  Filled 2016-11-28: qty 1

## 2016-11-28 MED ORDER — LABETALOL HCL 5 MG/ML IV SOLN: 10.0000 mg | INTRAVENOUS | Status: DC | PRN

## 2016-11-28 MED ORDER — NICARDIPINE HCL IN NACL 20-0.86 MG/200ML-% IV SOLN
5.0000 mg/h | INTRAVENOUS | Status: DC
  Administered 2016-11-28: 5 mg/h via INTRAVENOUS

## 2016-11-28 MED ORDER — BACITRACIN ZINC 500 UNIT/GM EX OINT
TOPICAL_OINTMENT | CUTANEOUS | Status: DC | PRN
  Administered 2016-11-28: 1 via TOPICAL

## 2016-11-28 MED ORDER — VANCOMYCIN HCL 1000 MG IV SOLR
INTRAVENOUS | Status: AC
  Filled 2016-11-28: qty 1000

## 2016-11-28 MED ORDER — ONDANSETRON HCL 4 MG/2ML IJ SOLN
4.0000 mg | INTRAMUSCULAR | Status: DC | PRN
  Administered 2016-11-28: 4 mg via INTRAVENOUS
  Filled 2016-11-28: qty 2

## 2016-11-28 MED ORDER — LEVETIRACETAM 500 MG PO TABS
500.0000 mg | ORAL_TABLET | Freq: Two times a day (BID) | ORAL | Status: DC
  Administered 2016-11-28 – 2016-12-02 (×8): 500 mg via ORAL
  Filled 2016-11-28 (×8): qty 1

## 2016-11-28 MED ORDER — LIDOCAINE-EPINEPHRINE (PF) 2 %-1:200000 IJ SOLN
INTRAMUSCULAR | Status: DC | PRN
Start: 2016-11-28 — End: 2016-11-28
  Administered 2016-11-28: 12 mL via INTRADERMAL

## 2016-11-28 MED ORDER — FENTANYL CITRATE (PF) 100 MCG/2ML IJ SOLN
INTRAMUSCULAR | Status: AC
  Filled 2016-11-28: qty 2

## 2016-11-28 MED ORDER — EPHEDRINE 5 MG/ML INJ
INTRAVENOUS | Status: AC
  Filled 2016-11-28: qty 10

## 2016-11-28 MED ORDER — ROCURONIUM BROMIDE 100 MG/10ML IV SOLN
INTRAVENOUS | Status: DC | PRN
  Administered 2016-11-28: 20 mg via INTRAVENOUS
  Administered 2016-11-28 (×2): 50 mg via INTRAVENOUS

## 2016-11-28 MED ORDER — MORPHINE SULFATE (PF) 2 MG/ML IV SOLN: 1.0000 mg | INTRAVENOUS | Status: DC | PRN

## 2016-11-28 MED ORDER — HEPARIN SODIUM (PORCINE) 5000 UNIT/ML IJ SOLN
5000.0000 [IU] | Freq: Three times a day (TID) | INTRAMUSCULAR | Status: DC
  Administered 2016-11-30 – 2016-12-02 (×8): 5000 [IU] via SUBCUTANEOUS
  Filled 2016-11-28 (×8): qty 1

## 2016-11-28 MED ORDER — LIDOCAINE HCL 4 % EX SOLN
CUTANEOUS | Status: DC | PRN
  Administered 2016-11-28: 4 mL via TOPICAL

## 2016-11-28 MED ORDER — PROMETHAZINE HCL 25 MG PO TABS: 12.5000 mg | ORAL_TABLET | ORAL | Status: DC | PRN

## 2016-11-28 MED ORDER — NEOSTIGMINE METHYLSULFATE 5 MG/5ML IV SOSY
PREFILLED_SYRINGE | INTRAVENOUS | Status: AC
  Filled 2016-11-28: qty 5

## 2016-11-28 MED ORDER — EPHEDRINE SULFATE 50 MG/ML IJ SOLN
INTRAMUSCULAR | Status: DC | PRN
  Administered 2016-11-28: 10 mg via INTRAVENOUS

## 2016-11-28 MED ORDER — FENTANYL CITRATE (PF) 100 MCG/2ML IJ SOLN
INTRAMUSCULAR | Status: AC
  Filled 2016-11-28: qty 4

## 2016-11-28 MED ORDER — LIDOCAINE-EPINEPHRINE (PF) 2 %-1:200000 IJ SOLN
INTRAMUSCULAR | Status: AC
  Filled 2016-11-28: qty 20

## 2016-11-28 MED ORDER — NALOXONE HCL 0.4 MG/ML IJ SOLN: 0.0800 mg | INTRAMUSCULAR | Status: DC | PRN

## 2016-11-28 MED ORDER — MAGNESIUM CITRATE PO SOLN: 1.0000 | Freq: Once | ORAL | Status: DC | PRN

## 2016-11-28 MED ORDER — DEXAMETHASONE SODIUM PHOSPHATE 10 MG/ML IJ SOLN
INTRAMUSCULAR | Status: DC | PRN
  Administered 2016-11-28: 10 mg via INTRAVENOUS

## 2016-11-28 MED ORDER — ROCURONIUM BROMIDE 50 MG/5ML IV SOSY
PREFILLED_SYRINGE | INTRAVENOUS | Status: AC
  Filled 2016-11-28: qty 15

## 2016-11-28 MED ORDER — ESMOLOL HCL 100 MG/10ML IV SOLN
INTRAVENOUS | Status: DC | PRN
  Administered 2016-11-28 (×2): 10 mg via INTRAVENOUS
  Administered 2016-11-28: 20 mg via INTRAVENOUS
  Administered 2016-11-28: 10 mg via INTRAVENOUS

## 2016-11-28 MED ORDER — ONDANSETRON HCL 4 MG PO TABS: 4.0000 mg | ORAL_TABLET | ORAL | Status: DC | PRN

## 2016-11-28 MED ORDER — SENNOSIDES-DOCUSATE SODIUM 8.6-50 MG PO TABS: 1.0000 | ORAL_TABLET | Freq: Every evening | ORAL | Status: DC | PRN

## 2016-11-28 MED ORDER — ARTIFICIAL TEARS OP OINT
TOPICAL_OINTMENT | OPHTHALMIC | Status: DC | PRN
  Administered 2016-11-28: 1 via OPHTHALMIC

## 2016-11-28 MED ORDER — LORATADINE 10 MG PO TABS: 10.0000 mg | ORAL_TABLET | Freq: Every day | ORAL | Status: DC | PRN

## 2016-11-28 MED ORDER — PROPOFOL 10 MG/ML IV BOLUS
INTRAVENOUS | Status: DC | PRN
  Administered 2016-11-28: 20 mg via INTRAVENOUS
  Administered 2016-11-28: 150 mg via INTRAVENOUS

## 2016-11-28 MED ORDER — LACTATED RINGERS IV SOLN: INTRAVENOUS | Status: DC

## 2016-11-28 MED ORDER — HEMOSTATIC AGENTS (NO CHARGE) OPTIME
TOPICAL | Status: DC | PRN
Start: 2016-11-28 — End: 2016-11-28
  Administered 2016-11-28: 1 via TOPICAL

## 2016-11-28 MED ORDER — DEXAMETHASONE 4 MG PO TABS
4.0000 mg | ORAL_TABLET | Freq: Three times a day (TID) | ORAL | Status: DC
  Administered 2016-11-30 – 2016-12-02 (×6): 4 mg via ORAL
  Filled 2016-11-28 (×6): qty 1

## 2016-11-28 MED ORDER — NEOSTIGMINE METHYLSULFATE 10 MG/10ML IV SOLN
INTRAVENOUS | Status: DC | PRN
  Administered 2016-11-28: 5 mg via INTRAVENOUS

## 2016-11-28 MED ORDER — PANTOPRAZOLE SODIUM 40 MG IV SOLR
40.0000 mg | Freq: Every day | INTRAVENOUS | Status: DC
  Administered 2016-11-28: 40 mg via INTRAVENOUS
  Filled 2016-11-28: qty 40

## 2016-11-28 MED ORDER — ISOPROPYL ALCOHOL 70 % SOLN
Status: DC | PRN
  Administered 2016-11-28: 1 via TOPICAL

## 2016-11-28 MED ORDER — ARTIFICIAL TEARS OP OINT
TOPICAL_OINTMENT | OPHTHALMIC | Status: AC
  Filled 2016-11-28: qty 3.5

## 2016-11-28 MED ORDER — PROMETHAZINE HCL 25 MG/ML IJ SOLN
6.2500 mg | INTRAMUSCULAR | Status: DC | PRN
Start: 2016-11-28 — End: 2016-11-28

## 2016-11-28 MED ORDER — GLYCOPYRROLATE 0.2 MG/ML IJ SOLN
INTRAMUSCULAR | Status: DC | PRN
  Administered 2016-11-28: .8 mg via INTRAVENOUS

## 2016-11-28 MED ORDER — PROCHLORPERAZINE 25 MG RE SUPP
25.0000 mg | Freq: Two times a day (BID) | RECTAL | Status: DC | PRN
  Administered 2016-11-28: 25 mg via RECTAL
  Filled 2016-11-28 (×2): qty 1

## 2016-11-28 MED ORDER — PROPOFOL 10 MG/ML IV BOLUS
INTRAVENOUS | Status: AC
  Filled 2016-11-28: qty 20

## 2016-11-28 MED ORDER — LIDOCAINE 2% (20 MG/ML) 5 ML SYRINGE
INTRAMUSCULAR | Status: AC
  Filled 2016-11-28: qty 10

## 2016-11-28 MED ORDER — BISACODYL 5 MG PO TBEC: 5.0000 mg | DELAYED_RELEASE_TABLET | Freq: Every day | ORAL | Status: DC | PRN

## 2016-11-28 MED ORDER — SURGIFOAM 100 EX MISC
CUTANEOUS | Status: DC | PRN
  Administered 2016-11-28: 09:00:00 via TOPICAL

## 2016-11-28 MED ORDER — FENTANYL CITRATE (PF) 100 MCG/2ML IJ SOLN
INTRAMUSCULAR | Status: DC | PRN
  Administered 2016-11-28: 50 ug via INTRAVENOUS
  Administered 2016-11-28 (×3): 25 ug via INTRAVENOUS
  Administered 2016-11-28: 50 ug via INTRAVENOUS
  Administered 2016-11-28: 25 ug via INTRAVENOUS
  Administered 2016-11-28 (×2): 50 ug via INTRAVENOUS

## 2016-11-28 MED ORDER — MEPERIDINE HCL 25 MG/ML IJ SOLN: 6.2500 mg | INTRAMUSCULAR | Status: DC | PRN

## 2016-11-28 MED ORDER — THROMBIN 20000 UNITS EX SOLR
CUTANEOUS | Status: AC
  Filled 2016-11-28: qty 20000

## 2016-11-28 MED ORDER — SENNA 8.6 MG PO TABS
1.0000 | ORAL_TABLET | Freq: Two times a day (BID) | ORAL | Status: DC
  Administered 2016-11-28 – 2016-12-02 (×8): 8.6 mg via ORAL
  Filled 2016-11-28 (×8): qty 1

## 2016-11-28 MED ORDER — BACITRACIN ZINC 500 UNIT/GM EX OINT
TOPICAL_OINTMENT | CUTANEOUS | Status: AC
  Filled 2016-11-28: qty 28.35

## 2016-11-28 MED ORDER — DEXAMETHASONE 4 MG PO TABS
4.0000 mg | ORAL_TABLET | Freq: Four times a day (QID) | ORAL | Status: AC
  Administered 2016-11-29 – 2016-11-30 (×4): 4 mg via ORAL
  Filled 2016-11-28 (×4): qty 1

## 2016-11-28 MED ORDER — 0.9 % SODIUM CHLORIDE (POUR BTL) OPTIME
TOPICAL | Status: DC | PRN
  Administered 2016-11-28 (×2): 1000 mL

## 2016-11-28 MED ORDER — VITAMIN D (ERGOCALCIFEROL) 1.25 MG (50000 UNIT) PO CAPS
50000.0000 [IU] | ORAL_CAPSULE | ORAL | Status: DC
  Administered 2016-11-29: 50000 [IU] via ORAL
  Filled 2016-11-28: qty 1

## 2016-11-28 MED ORDER — VITAMIN D3 1.25 MG (50000 UT) PO CAPS: 1.0000 | ORAL_CAPSULE | ORAL | Status: DC

## 2016-11-28 MED ORDER — NICARDIPINE HCL IN NACL 20-0.86 MG/200ML-% IV SOLN
3.0000 mg/h | Freq: Once | INTRAVENOUS | Status: AC
  Administered 2016-11-28: 3 mg/h via INTRAVENOUS
  Filled 2016-11-28: qty 200

## 2016-11-28 MED ORDER — ONDANSETRON HCL 4 MG/2ML IJ SOLN
INTRAMUSCULAR | Status: AC
  Filled 2016-11-28: qty 2

## 2016-11-28 MED ORDER — POTASSIUM CHLORIDE IN NACL 20-0.9 MEQ/L-% IV SOLN
INTRAVENOUS | Status: DC
  Administered 2016-11-28: 80 mL/h via INTRAVENOUS
  Administered 2016-11-29: 04:00:00 via INTRAVENOUS
  Filled 2016-11-28 (×2): qty 1000

## 2016-11-28 SURGICAL SUPPLY — 95 items
APL SKNCLS STERI-STRIP NONHPOA (GAUZE/BANDAGES/DRESSINGS)
BENZOIN TINCTURE PRP APPL 2/3 (GAUZE/BANDAGES/DRESSINGS) IMPLANT
BLADE CLIPPER SURG (BLADE) ×4 IMPLANT
BLADE SAW GIGLI 16 STRL (MISCELLANEOUS) IMPLANT
BLADE SURG 15 STRL LF DISP TIS (BLADE) IMPLANT
BLADE SURG 15 STRL SS (BLADE)
BLADE ULTRA TIP 2M (BLADE) ×4 IMPLANT
BNDG GAUZE ELAST 4 BULKY (GAUZE/BANDAGES/DRESSINGS) IMPLANT
BUR ACORN 6.0 PRECISION (BURR) ×3 IMPLANT
BUR ACORN 6.0MM PRECISION (BURR) ×1
BUR MATCHSTICK NEURO 3.0 LAGG (BURR) ×4 IMPLANT
BUR SPIRAL ROUTER 2.3 (BUR) ×3 IMPLANT
BUR SPIRAL ROUTER 2.3MM (BUR) ×1
CANISTER SUCT 3000ML PPV (MISCELLANEOUS) ×4 IMPLANT
CARTRIDGE OIL MAESTRO DRILL (MISCELLANEOUS) ×2 IMPLANT
CATH VENTRIC 35X38 W/TROCAR LG (CATHETERS) IMPLANT
CLIP TI MEDIUM 6 (CLIP) IMPLANT
CONT SPEC 4OZ CLIKSEAL STRL BL (MISCELLANEOUS) ×8 IMPLANT
COVER MAYO STAND STRL (DRAPES) IMPLANT
DECANTER SPIKE VIAL GLASS SM (MISCELLANEOUS) IMPLANT
DIFFUSER DRILL AIR PNEUMATIC (MISCELLANEOUS) ×4 IMPLANT
DRAIN SUBARACHNOID (WOUND CARE) IMPLANT
DRAPE CAMERA VIDEO/LASER (DRAPES) IMPLANT
DRAPE MICROSCOPE LEICA (MISCELLANEOUS) IMPLANT
DRAPE NEUROLOGICAL W/INCISE (DRAPES) ×4 IMPLANT
DRAPE ORTHO SPLIT 77X108 STRL (DRAPES)
DRAPE STERI IOBAN 125X83 (DRAPES) IMPLANT
DRAPE SURG 17X23 STRL (DRAPES) IMPLANT
DRAPE SURG ORHT 6 SPLT 77X108 (DRAPES) IMPLANT
DRAPE WARM FLUID 44X44 (DRAPE) ×4 IMPLANT
DRSG TELFA 3X8 NADH (GAUZE/BANDAGES/DRESSINGS) ×4 IMPLANT
DURAPREP 6ML APPLICATOR 50/CS (WOUND CARE) ×4 IMPLANT
ELECT CAUTERY BLADE 6.4 (BLADE) ×4 IMPLANT
ELECT REM PT RETURN 9FT ADLT (ELECTROSURGICAL) ×4
ELECTRODE REM PT RTRN 9FT ADLT (ELECTROSURGICAL) ×2 IMPLANT
EVACUATOR 1/8 PVC DRAIN (DRAIN) IMPLANT
EVACUATOR SILICONE 100CC (DRAIN) IMPLANT
FORCEPS BIPOLAR SPETZLER 8 1.0 (NEUROSURGERY SUPPLIES) ×4 IMPLANT
GAUZE SPONGE 4X4 12PLY STRL (GAUZE/BANDAGES/DRESSINGS) ×4 IMPLANT
GAUZE SPONGE 4X4 16PLY XRAY LF (GAUZE/BANDAGES/DRESSINGS) IMPLANT
GLOVE BIOGEL PI IND STRL 6.5 (GLOVE) ×2 IMPLANT
GLOVE BIOGEL PI INDICATOR 6.5 (GLOVE) ×2
GLOVE ECLIPSE 6.5 STRL STRAW (GLOVE) ×4 IMPLANT
GLOVE ECLIPSE 8.5 STRL (GLOVE) ×4 IMPLANT
GLOVE EXAM NITRILE LRG STRL (GLOVE) IMPLANT
GLOVE EXAM NITRILE XL STR (GLOVE) IMPLANT
GLOVE EXAM NITRILE XS STR PU (GLOVE) IMPLANT
GLOVE INDICATOR 8.5 STRL (GLOVE) ×4 IMPLANT
GLOVE SURG SS PI 6.5 STRL IVOR (GLOVE) ×4 IMPLANT
GOWN STRL REUS W/ TWL LRG LVL3 (GOWN DISPOSABLE) ×4 IMPLANT
GOWN STRL REUS W/ TWL XL LVL3 (GOWN DISPOSABLE) IMPLANT
GOWN STRL REUS W/TWL 2XL LVL3 (GOWN DISPOSABLE) ×4 IMPLANT
GOWN STRL REUS W/TWL LRG LVL3 (GOWN DISPOSABLE) ×8
GOWN STRL REUS W/TWL XL LVL3 (GOWN DISPOSABLE)
GRAFT DURAGEN MATRIX 1WX1L (Tissue) ×4 IMPLANT
GRAFT DURAGEN MATRIX 2WX2L ×4 IMPLANT
HEMOSTAT SURGICEL 2X14 (HEMOSTASIS) IMPLANT
KIT BASIN OR (CUSTOM PROCEDURE TRAY) ×8 IMPLANT
KIT DRAIN CSF ACCUDRAIN (MISCELLANEOUS) IMPLANT
KIT ROOM TURNOVER OR (KITS) ×4 IMPLANT
MARKER SPHERE PSV REFLC 13MM (MARKER) ×12 IMPLANT
NEEDLE HYPO 25X1 1.5 SAFETY (NEEDLE) ×4 IMPLANT
NEEDLE SPNL 18GX3.5 QUINCKE PK (NEEDLE) IMPLANT
NS IRRIG 1000ML POUR BTL (IV SOLUTION) ×4 IMPLANT
OIL CARTRIDGE MAESTRO DRILL (MISCELLANEOUS) ×4
PACK CRANIOTOMY (CUSTOM PROCEDURE TRAY) ×4 IMPLANT
PATTIES SURGICAL .25X.25 (GAUZE/BANDAGES/DRESSINGS) IMPLANT
PATTIES SURGICAL .5 X.5 (GAUZE/BANDAGES/DRESSINGS) IMPLANT
PATTIES SURGICAL .5 X3 (DISPOSABLE) IMPLANT
PATTIES SURGICAL 1/4 X 3 (GAUZE/BANDAGES/DRESSINGS) IMPLANT
PATTIES SURGICAL 1X1 (DISPOSABLE) IMPLANT
PLATE 1.5  2HOLE MED NEURO (Plate) ×4 IMPLANT
PLATE 1.5 2HOLE MED NEURO (Plate) ×4 IMPLANT
PLATE 1.5/0.5 18.5MM BURR HOLE (Plate) ×4 IMPLANT
RUBBERBAND STERILE (MISCELLANEOUS) IMPLANT
SCREW SELF DRILL HT 1.5/4MM (Screw) ×32 IMPLANT
SPECIMEN JAR SMALL (MISCELLANEOUS) IMPLANT
SPONGE NEURO XRAY DETECT 1X3 (DISPOSABLE) IMPLANT
SPONGE SURGIFOAM ABS GEL 100 (HEMOSTASIS) ×8 IMPLANT
STAPLER VISISTAT 35W (STAPLE) ×4 IMPLANT
SUT ETHILON 3 0 FSL (SUTURE) IMPLANT
SUT ETHILON 3 0 PS 1 (SUTURE) IMPLANT
SUT NURALON 4 0 TR CR/8 (SUTURE) ×8 IMPLANT
SUT SILK 0 TIES 10X30 (SUTURE) IMPLANT
SUT STEEL 0 (SUTURE)
SUT STEEL 0 18XMFL TIE 17 (SUTURE) IMPLANT
SUT VIC AB 2-0 CT2 18 VCP726D (SUTURE) ×8 IMPLANT
SYR CONTROL 10ML LL (SYRINGE) ×4 IMPLANT
TOWEL OR 17X24 6PK STRL BLUE (TOWEL DISPOSABLE) ×4 IMPLANT
TOWEL OR 17X26 10 PK STRL BLUE (TOWEL DISPOSABLE) ×4 IMPLANT
TRAY FOLEY W/METER SILVER 16FR (SET/KITS/TRAYS/PACK) ×4 IMPLANT
TUBE CONNECTING 12'X1/4 (SUCTIONS) ×1
TUBE CONNECTING 12X1/4 (SUCTIONS) ×3 IMPLANT
UNDERPAD 30X30 (UNDERPADS AND DIAPERS) ×4 IMPLANT
WATER STERILE IRR 1000ML POUR (IV SOLUTION) ×4 IMPLANT

## 2016-11-28 NOTE — Interval H&P Note (Signed)
History and Physical Interval Note: [name] comes in today for definitive treatment and diagnosis of a cerebral mass in the right frontal parietal region.  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 She has been on decadron, and Keppra. She understands that the risk of stroke,coma, death, brain damage, change in personality, weaknesss, paralysis, seizures, bleeding, infection. We will proceed with the craniotomy for resection of the mass.  Physical exam is unchanged.  11/28/2016 9:04 AM  Donna Valentine  has presented today for surgery, with the diagnosis of Brain tumor  The various methods of treatment have been discussed with the patient and family. After consideration of risks, benefits and other options for treatment, the patient has consented to  Procedure(s) with comments: Right Craniotomy for tumor with Brainlab (Right) - Right Craniotomy for tumor with [place] (N/A) as a surgical intervention .  The patient's history has been reviewed, patient examined, no change in status, stable for surgery.  I have reviewed the patient's chart and labs.  Questions were answered to the patient's satisfaction.     Margia Wiesen L

## 2016-11-28 NOTE — Anesthesia Postprocedure Evaluation (Addendum)
Anesthesia Post Note  Patient: Donna Valentine  Procedure(s) Performed: Procedure(s) (LRB): STERIOTACTIC PARIETAL CRANIOTOMY FOR RIGHT FRONTAL TUMOR RESECTION WITH BRAINLAB (Right) APPLICATION OF CRANIAL NAVIGATION (N/A)  Patient location during evaluation: PACU Anesthesia Type: General Level of consciousness: awake and alert Pain management: pain level controlled Vital Signs Assessment: post-procedure vital signs reviewed and stable Respiratory status: spontaneous breathing, nonlabored ventilation, respiratory function stable and patient connected to nasal cannula oxygen Cardiovascular status: blood pressure returned to baseline and stable Postop Assessment: no signs of nausea or vomiting Anesthetic complications: no       Last Vitals:  Vitals:   11/28/16 1407 11/28/16 1500  BP: (!) 142/70 118/65  Pulse:  75  Resp: 15 17  Temp: (!) 35.9 C     Last Pain:  Vitals:   11/28/16 1407  TempSrc: Rectal  PainSc: 4                  Effie Berkshire

## 2016-11-28 NOTE — Transfer of Care (Signed)
Immediate Anesthesia Transfer of Care Note  Patient: Donna Valentine  Procedure(s) Performed: Procedure(s): STERIOTACTIC PARIETAL CRANIOTOMY FOR RIGHT FRONTAL TUMOR RESECTION WITH BRAINLAB (Right) APPLICATION OF CRANIAL NAVIGATION (N/A)  Patient Location: PACU  Anesthesia Type:General  Level of Consciousness: awake, alert , oriented and patient cooperative  Airway & Oxygen Therapy: Patient Spontanous Breathing and Patient connected to nasal cannula oxygen  Post-op Assessment: Report given to RN and Post -op Vital signs reviewed and stable  Post vital signs: Reviewed and stable  Last Vitals:  Vitals:   11/28/16 0633  BP: (!) 188/74  Pulse: (!) 57  Resp: 18  Temp: 36.7 C    Last Pain:  Vitals:   11/28/16 0633  TempSrc: Oral      Patients Stated Pain Goal: 2 ([id number])  Complications: No apparent anesthesia complications

## 2016-11-28 NOTE — Op Note (Signed)
11/28/2016  1:29 PM  PATIENT:  Donna Valentine  76 y.o. female  PRE-OPERATIVE DIAGNOSIS:  Brain tumor, right frontal  POST-OPERATIVE DIAGNOSIS:  Brain tumor, right frontal  PROCEDURE:  Procedure(s): STERIOTACTIC PARIETAL CRANIOTOMY FOR RIGHT FRONTAL TUMOR RESECTION WITH BRAINLAB APPLICATION OF CRANIAL NAVIGATION  SURGEON: Surgeon(s): Ashok Pall, MD Kristeen Miss, MD  ASSISTANTS:Elsner, Mallie Mussel  ANESTHESIA:   general  EBL:  Total I/O In: [id number] [I.V.:1600; IV Piggyback:250] Out: 60 [Urine:530; Blood:60]  BLOOD ADMINISTERED:none  CELL SAVER GIVEN:none  COUNT:per nursing  DRAINS: none   SPECIMEN:  Source of Specimen:  right frontal lobe  DICTATION: [name] was taken to the operating room, intubated, and placed under a general anesthetic without difficulty. She was positioned supine on the bed. Once adequate anesthesia was obtained I placed her head in a three pin [place] head holder. Using the preoperative ct scan and stereotactic plan, her head was registered to the Brain Lab system. The head registration was verified, and was accurate. Her head was prepped via a 91minute hibiclens shampoo. [name] head was then draped in a sterile manner.  I used the comb to create a line for the incision. I infiltrated lidocaine into the planned incision using the stereotactic guidance. I opened the skin with a 10 blade and took the incision down to the skull. I placed Raney clips on the scalp edges and used a Weitlaner retractor to expose the skull. I checked the stereotactic system to locate the craniotomy. I created a burr hole then turned the craniotomy to expose the dura. I once again verified my location with the brain lab system. I opened the dura and did not see a true abnormality on the brain surface. Thus I used the the Brain Lab to locate my brain opening. I used the bipolar cautery to coagulate some superficial veins, divided them and started to remove what I felt  to be abnormal tissue. The stereotactic system confirmed my location in the center of the abnormality. I sent specimen for frozen sectioning, and was informed the tissue was hypercellular. We sent more samples and was again informed that the tissue was hypercellular. At this time per the stereotactic guidance I was well within the abnormality. There was a pial border medial to me so I resected the abnormal tissue leaving the pial bank intact.  Permanent samples were also sent.  I irrigated the wound, achieved hemostasis, then closed. I loosely approximated the dura, then laid dural substitute over the closure so all the brain was covered. I approximated the bone flap with plates and screws.Then the scalp with galeal sutures(vicryl), and staples for the scalp edges. I applied a sterile dressing. [name] was then extubated and taken to the recovery area.  PLAN OF CARE: Admit to inpatient   PATIENT DISPOSITION:  PACU - hemodynamically stable.   Delay start of Pharmacological VTE agent (>24hrs) due to surgical blood loss or risk of bleeding:  yes

## 2016-11-28 NOTE — H&P (View-Only) (Signed)
Reason for Consult:enhancing cerebral mass Referring Physician: Long  Donna Valentine is an 76 y.o. female.  HPI: whom was in her usual health until yesterday when while trying to get dressed became very weak in her left upper extremity. This did not improve so Donna Valentine came to the ED today. Head ct revealed possible mass, and surrounding edema. MRI revealed a mass in the right frontal lobe, possible multifocal, enhancing with low signal on t1 in its  core  History reviewed. No pertinent past medical history.  History reviewed. No pertinent surgical history.  History reviewed. No pertinent family history.  Social History:  reports that Donna Valentine has never smoked. Donna Valentine has never used smokeless tobacco. Donna Valentine reports that Donna Valentine does not drink alcohol or use drugs.  Allergies:  Allergies  Allergen Reactions  . Penicillins Other (See Comments)    Medications: I have reviewed the patient's current medications.  Results for orders placed or performed during the hospital encounter of 11/20/16 (from the past 48 hour(s))  Protime-INR     Status: None   Collection Time: 11/20/16  4:52 PM  Result Value Ref Range   Prothrombin Time 14.1 11.4 - 15.2 seconds   INR 1.09   APTT     Status: None   Collection Time: 11/20/16  4:52 PM  Result Value Ref Range   aPTT 31 24 - 36 seconds  CBC     Status: None   Collection Time: 11/20/16  4:52 PM  Result Value Ref Range   WBC 7.1 4.0 - 10.5 K/uL   RBC 5.04 3.87 - 5.11 MIL/uL   Hemoglobin 14.2 12.0 - 15.0 g/dL   HCT 43.8 36.0 - 46.0 %   MCV 86.9 78.0 - 100.0 fL   MCH 28.2 26.0 - 34.0 pg   MCHC 32.4 30.0 - 36.0 g/dL   RDW 13.3 11.5 - 15.5 %   Platelets 233 150 - 400 K/uL  Differential     Status: None   Collection Time: 11/20/16  4:52 PM  Result Value Ref Range   Neutrophils Relative % 66 %   Neutro Abs 4.7 1.7 - 7.7 K/uL   Lymphocytes Relative 24 %   Lymphs Abs 1.7 0.7 - 4.0 K/uL   Monocytes Relative 9 %   Monocytes Absolute 0.6 0.1 - 1.0 K/uL    Eosinophils Relative 1 %   Eosinophils Absolute 0.1 0.0 - 0.7 K/uL   Basophils Relative 0 %   Basophils Absolute 0.0 0.0 - 0.1 K/uL  Comprehensive metabolic panel     Status: Abnormal   Collection Time: 11/20/16  4:52 PM  Result Value Ref Range   Sodium 139 135 - 145 mmol/L   Potassium 3.6 3.5 - 5.1 mmol/L   Chloride 103 101 - 111 mmol/L   CO2 26 22 - 32 mmol/L   Glucose, Bld 111 (H) 65 - 99 mg/dL   BUN 9 6 - 20 mg/dL   Creatinine, Ser 0.82 0.44 - 1.00 mg/dL   Calcium 9.3 8.9 - 10.3 mg/dL   Total Protein 8.4 (H) 6.5 - 8.1 g/dL   Albumin 3.9 3.5 - 5.0 g/dL   AST 28 15 - 41 U/L   ALT 19 14 - 54 U/L   Alkaline Phosphatase 86 38 - 126 U/L   Total Bilirubin 0.6 0.3 - 1.2 mg/dL   GFR calc non Af Amer >60 >60 mL/min   GFR calc Af Amer >60 >60 mL/min    Comment: (NOTE) The eGFR has been calculated using the  CKD EPI equation. This calculation has not been validated in all clinical situations. eGFR's persistently <60 mL/min signify possible Chronic Kidney Disease.    Anion gap 10 5 - 15  I-stat troponin, ED     Status: None   Collection Time: 11/20/16  5:04 PM  Result Value Ref Range   Troponin i, poc 0.00 0.00 - 0.08 ng/mL   Comment 3            Comment: Due to the release kinetics of cTnI, a negative result within the first hours of the onset of symptoms does not rule out myocardial infarction with certainty. If myocardial infarction is still suspected, repeat the test at appropriate intervals.   I-Stat Chem 8, ED     Status: Abnormal   Collection Time: 11/20/16  5:06 PM  Result Value Ref Range   Sodium 143 135 - 145 mmol/L   Potassium 3.6 3.5 - 5.1 mmol/L   Chloride 104 101 - 111 mmol/L   BUN 11 6 - 20 mg/dL   Creatinine, Ser 0.80 0.44 - 1.00 mg/dL   Glucose, Bld 111 (H) 65 - 99 mg/dL   Calcium, Ion 1.07 (L) 1.15 - 1.40 mmol/L   TCO2 26 0 - 100 mmol/L   Hemoglobin 15.6 (H) 12.0 - 15.0 g/dL   HCT 46.0 36.0 - 46.0 %    Dg Chest 2 View  Result Date: 11/19/2016 CLINICAL  DATA:  Productive cough for the past 2 weeks. Former smoker. EXAM: CHEST  2 VIEW COMPARISON:  Chest x-ray of July 10, 2010 FINDINGS: The lungs are well-expanded. There is no focal infiltrate. There is a pectus excavatum type chest contour. The heart and pulmonary vascularity are normal. The mediastinum is normal in width. There is no pleural effusion. There is moderate dextrocurvature centered in the lower thoracic spine which is stable. IMPRESSION: Mild chronic bronchitic changes, stable. No pneumonia, pulmonary edema, nor other acute cardiopulmonary abnormality. Electronically Signed   By: Donna  Valentine M.D.   On: 11/19/2016 14:26   Ct Head Wo Contrast  Result Date: 11/20/2016 CLINICAL DATA:  Left arm weakness. EXAM: CT HEAD WITHOUT CONTRAST TECHNIQUE: Contiguous axial images were obtained from the base of the skull through the vertex without intravenous contrast. COMPARISON:  None. FINDINGS: Brain: Large amount of edema is identified in the high right frontal parietal region. Appearance is more suggestive of vasogenic edema than cytotoxic edema on this study. Features may be related to a potential mass lesion identified in the high right parietal region measuring 2.2 cm (image 25 series 201). Despite the edema, no subfalcine midline shift is evident. No evidence for hydrocephalus. Vascular: No hyperdense vessel or unexpected calcification. Skull: Normal. Negative for fracture or focal lesion. Sinuses/Orbits: No acute finding. Other: None. IMPRESSION: 1. Relatively large volume of apparent vasogenic edema in the high right frontoparietal region, likely secondary to a 2.2 cm high frontal parietal lobe lesion at the gray-white junction. MRI without and with contrast would be the study of choice to further evaluate. 2. No substantial mass-effect or midline shift. I discussed these findings by telephone with the triage nurse Donna Valentine) at [facility] hours on 05/24/2017. Electronically Signed   By: Donna Valentine M.D.    On: 11/20/2016 18:12   Mr Donna Valentine KD Contrast  Result Date: 11/20/2016 CLINICAL DATA:  LEFT-sided weakness for 1 day, assess for stroke. EXAM: MRI HEAD WITHOUT AND WITH CONTRAST TECHNIQUE: Multiplanar, multiecho pulse sequences of the brain and surrounding structures were obtained without and with intravenous  contrast. CONTRAST:  25m MULTIHANCE GADOBENATE DIMEGLUMINE 529 MG/ML IV SOLN COMPARISON:  CT HEAD November 20, 2016 at 1752 hours FINDINGS: INTRACRANIAL CONTENTS: Ring-enhancing RIGHT frontal lobe convexity intraparenchymal 2.2 x 1.6 x 1.9 cm mass extending to the dura, central subcentimeter susceptibility artifact which could reflect mineralization or hemosiderin. Corresponding reduced diffusion and low ADC values. At least 4 additional subcentimeter satellite nodules anterior to the dominant lesion in the RIGHT frontal lobe juxta cortical white matter. Extensive surrounding T2 bright vasogenic edema and regional sulcal effacement. No midline shift. Ventricles and sulci are normal for patient's age. Patchy supratentorial white matter FLAIR T2 hyperintensities exclusive of the aforementioned abnormality compatible with mild to moderate chronic small vessel ischemic disease. No abnormal extra-axial enhancement. VASCULAR: Normal major intracranial vascular flow voids present at skull base. SKULL AND UPPER CERVICAL SPINE: No abnormal sellar expansion. No suspicious calvarial bone marrow signal. Craniocervical junction maintained. SINUSES/ORBITS: The mastoid air-cells and included paranasal sinuses are well-aerated.The included ocular globes and orbital contents are non-suspicious. OTHER: None. IMPRESSION: Dominant 2.2 x 1.6 x 1.9 cm RIGHT frontal lobe mass with subcentimeter satellite nodules within extensive vasogenic edema. 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 No infarct.  Mild chronic small vessel ischemic disease.  Acute findings discussed with and reconfirmed by Dr.ROBERT LOCKWOOD on 11/20/2016 at 8:25 pm. Electronically Signed   By: CElon AlasM.D.   On: 11/20/2016 20:28    Review of Systems  HENT: Negative.   Eyes: Negative.   Respiratory: Negative.   Cardiovascular: Negative.   Gastrointestinal: Negative.   Genitourinary: Negative.   Musculoskeletal: Negative.   Skin: Negative.   Neurological: Positive for focal weakness and weakness.  Endo/Heme/Allergies: Negative.   Psychiatric/Behavioral: Negative.    Blood pressure 159/76, pulse 63, temperature 98.7 F (37.1 C), temperature source Oral, resp. rate 19, SpO2 99 %. Physical Exam  Constitutional: Donna Valentine is oriented to person, place, and time. Donna Valentine appears well-developed and well-nourished. No distress.  HENT:  Head: Normocephalic.  Right Ear: External ear normal.  Left Ear: External ear normal.  Nose: Nose normal.  Mouth/Throat: Oropharynx is clear and moist. No oropharyngeal exudate.  Eyes: Conjunctivae and EOM are normal. Pupils are equal, round, and reactive to light.  Neck: Normal range of motion. Neck supple.  Cardiovascular: Normal rate, regular rhythm, normal heart sounds and intact distal pulses.   Respiratory: Effort normal and breath sounds normal.  GI: Soft. Bowel sounds are normal.  Musculoskeletal:  Weakness left upper extremity, left lower extremity lue>lle  Neurological: Donna Valentine is alert and oriented to person, place, and time. Donna Valentine has normal reflexes. A cranial nerve deficit is present. No sensory deficit. GCS eye subscore is 4. GCS verbal subscore is 5. GCS motor subscore is 6. Donna Valentine displays no Babinski's sign on the right side. Donna Valentine displays no Babinski's sign on the left side.  Mild left facial weakness on activation Weakness left deltoid, biceps 4-/5, 4/5 triceps, grip, intrinsics Normal strength right upper extremity Mild weakness left lower extremity 5-/5 Perrl, tongue and uvula midline   Skin: Donna Valentine is not  diaphoretic.    Assessment/Plan: 76year old with newly discovered brain mass causing weakness on the left side. Will treat with decadron, and if doing well will discharge tomorrow and arrange for an elective craniotomy to obtain tissue for diagnosis. I will add keppra to her orders.   Jaycob Mcclenton L 11/20/2016, 10:56 PM

## 2016-11-29 ENCOUNTER — Inpatient Hospital Stay (HOSPITAL_COMMUNITY): Payer: Medicare Other

## 2016-11-29 ENCOUNTER — Encounter (HOSPITAL_COMMUNITY): Payer: Self-pay | Admitting: Radiology

## 2016-11-29 LAB — CBC
HCT: 44.1 % (ref 36.0–46.0)
Hemoglobin: 14.5 g/dL (ref 12.0–15.0)
MCH: 28.4 pg (ref 26.0–34.0)
MCHC: 32.9 g/dL (ref 30.0–36.0)
MCV: 86.3 fL (ref 78.0–100.0)
PLATELETS: 194 10*3/uL (ref 150–400)
RBC: 5.11 MIL/uL (ref 3.87–5.11)
RDW: 13.4 % (ref 11.5–15.5)
WBC: 13.9 10*3/uL — AB (ref 4.0–10.5)

## 2016-11-29 LAB — CREATININE, SERUM
Creatinine, Ser: 0.75 mg/dL (ref 0.44–1.00)
GFR calc Af Amer: >60 mL/min (ref >60)
GFR calc non Af Amer: >60 mL/min (ref >60)

## 2016-11-29 LAB — MRSA PCR SCREENING: MRSA by PCR: NEGATIVE

## 2016-11-29 MED ORDER — PANTOPRAZOLE SODIUM 40 MG PO TBEC
40.0000 mg | DELAYED_RELEASE_TABLET | Freq: Every day | ORAL | Status: DC
  Administered 2016-11-29 – 2016-12-01 (×3): 40 mg via ORAL
  Filled 2016-11-29 (×3): qty 1

## 2016-11-29 MED ORDER — IOPAMIDOL (ISOVUE-300) INJECTION 61%
INTRAVENOUS | Status: AC
  Administered 2016-11-29: 75 mL
  Filled 2016-11-29: qty 75

## 2016-11-29 NOTE — Progress Notes (Signed)
Patient ID: Donna Valentine, female   DOB: Feb 22, 1941, 76 y.o.   MRN: [id number] Vital signs are stable Patient is awake and alert Motor function appears to be intact knee CT scan of brain pending today We'll continue to mobilize Will remain in ICU for today

## 2016-11-30 NOTE — Progress Notes (Addendum)
Patient ID: Donna Valentine, female   DOB: 21-Nov-1940, 76 y.o.   MRN: [id number] Vital signs are stable Patient is alert and oriented Incision is clean and dry Review of CT from yesterday demonstrates that the resection cavity is in the target of the lesion No complicating features are noted I'll  transfer the patient to the floor

## 2016-11-30 NOTE — Progress Notes (Signed)
New Admission Note:  Arrival Method: wheelchair Mental Orientation: A & O x4 Telemetry:n/a Assessment: Completed Skin: Surgical incision on the Right side of the head.  IV: R hand & L hand saline locked  Pain: 0/10 Tubes: n/a Safety Measures: Safety Fall Prevention Plan was discussed  Admission: Completed 5C06: Patient has been orientated to the room, unit and the staff. Family: Visiting   Orders have been reviewed and implemented. Will continue to monitor the patient. Call light has been placed within reach and bed alarm has been activated.   Arta Silence ,RN

## 2016-12-01 DIAGNOSIS — C711 Malignant neoplasm of frontal lobe: Secondary | ICD-10-CM | POA: Diagnosis present

## 2016-12-01 NOTE — Progress Notes (Signed)
Dr. Christella Noa paged to request PT order d/t patient swaying when walking.

## 2016-12-01 NOTE — Care Management Note (Signed)
Case Management Note  Patient Details  Name: Donna Valentine MRN: [id number] Date of Birth: 1941-08-18  Subjective/Objective:     Pt s/p resection of brain tumor. Pt is from home with spouse.                Action/Plan: Awaiting PT/OT recommendations. CM following for d/c needs, physician orders.   Expected Discharge Date:                  Expected Discharge Plan:     In-House Referral:     Discharge planning Services     Post Acute Care Choice:    Choice offered to:     DME Arranged:    DME Agency:     HH Arranged:    HH Agency:     Status of Service:  In process, will continue to follow  If discussed at Long Length of Stay Meetings, dates discussed:    Additional Comments:  Pollie Friar, RN 12/01/2016, 12:25 PM

## 2016-12-01 NOTE — Progress Notes (Signed)
Patient ID: Donna Valentine, female   DOB: 23-Dec-1940, 76 y.o.   MRN: [id number] BP (!) 145/60 (BP Location: Left Arm)   Pulse (!) 56   Temp 98 F (36.7 C) (Oral)   Resp 16   Ht 5\' 6"  (1.676 m)   Wt 79.8 kg (176 lb)   SpO2 95%   BMI 28.41 kg/m  Alert and oriented x 4 speech is clear and fluent Pathology revealed a gbm. I will ask for genetic classification of the tumor. I will consult rad onc, and oncology for her to be seen tomorrow while still in the hospital.  Moving all extremities today Wound is clean, dry, no signs of infection.

## 2016-12-01 NOTE — Evaluation (Signed)
Occupational Therapy Evaluation Patient Details Name: Donna Valentine MRN: [id number] DOB: Sep 02, 1941 Today's Date: 12/01/2016    History of Present Illness 76 y.o. female with a h/o of HLD, PACs, and borderline HTN who presents with acute onset of L sided weakness and numbness for 1 day. 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 s/p STERIOTACTIC PARIETAL CRANIOTOMY FOR RIGHT FRONTAL TUMOR RESECTION WITH BRAINLAB   Clinical Impression   Patient is s/p R frontal tumor resection craniotomy surgery resulting in functional limitations due to the deficits listed below (see OT problem list). PTA was very active and driving.  Patient will benefit from skilled OT acutely to increase independence and safety with ADLS to allow discharge outpatient with PT recommendation due to balance deficits. Spouse requesting outpatient in [place].     Follow Up Recommendations  Outpatient OT    Equipment Recommendations  None recommended by OT    Recommendations for Other Services PT consult     Precautions / Restrictions Precautions Precautions: Fall Precaution Comments: pt demonstrates balance deficits      Mobility Bed Mobility Overal bed mobility: Needs Assistance Bed Mobility: Supine to Sit     Supine to sit: Mod assist     General bed mobility comments: pt reaching fo rtherapist undershooting and pulling up with R UE. Pt attempting to (A) with L UE no bed surface  Transfers Overall transfer level: Needs assistance Equipment used: 1 person hand held assist Transfers: Sit to/from Stand Sit to Stand: Min assist         General transfer comment: pt able to power up but unsteady initially    Balance Overall balance assessment: Needs assistance         Standing balance support: Single extremity supported;During functional activity Standing balance-Leahy Scale: Fair                              ADL Overall ADL's : Needs  assistance/impaired Eating/Feeding: Set up;Sitting   Grooming: Wash/dry hands;Oral care;Minimal assistance;Standing Grooming Details (indicate cue type and reason): pt needed cues for safety and using bil Ue. pt with L UE inability to squeeze tooth paste and needed cues to problem solve how to get paste from tube.            Upper Body Dressing Details (indicate cue type and reason): educated on dressing L then R for UE      Toilet Transfer: Minimal assistance;Ambulation           Functional mobility during ADLs: Minimal assistance General ADL Comments: pt demonstrates R to L drift with ambulation . pt demonstrates inability to alternate feet on steps. pt and wife educated on up with the good and down with the bad and both feet on a step before advancing. Pt with fall risk to steps. Spouse and patient able to demonstrate and use teach back to verbalize     Vision         Perception Perception Perception Tested?: Yes Perception Deficits: Inattention/neglect Inattention/Neglect: Does not attend to left side of body;Does not attend to left visual field   Praxis      Pertinent Vitals/Pain Pain Assessment: No/denies pain     Hand Dominance Right   Extremity/Trunk Assessment Upper Extremity Assessment Upper Extremity Assessment: LUE deficits/detail LUE Deficits / Details: Pt with AROM shoulder flexion, abduction, elbow flexion, wrist extension, digit flexion/ abduction. Pt demonstrates deficits with supination. Pt wtih decr grasp and tremor with  movement. pt reports tremor is worse and one medication being with held at this time that (A) with decr tremor LUE Coordination: decreased fine motor   Lower Extremity Assessment Lower Extremity Assessment: Defer to PT evaluation   Cervical / Trunk Assessment Cervical / Trunk Assessment: Normal   Communication Communication Communication: No difficulties   Cognition Arousal/Alertness: Awake/alert Behavior During Therapy: WFL  for tasks assessed/performed Overall Cognitive Status: Impaired/Different from baseline Area of Impairment: Awareness           Awareness: Emergent   General Comments: pt demonstrates L inattention during evaluation. pt lacks awareness to balance deficits   General Comments       Exercises   Other Exercises Other Exercises: instructed on fine motor for L UE - use of bil UE for functional task, purchase of playdoh, use of penny bank with change and use of rice to help locate objects in rice. Pt plans to have grandchildren visit to play games for exercises as well   Shoulder Instructions      Home Living Family/patient expects to be discharged to:: Private residence Living Arrangements: Spouse/significant other Available Help at Discharge: Family;Available 24 hours/day Type of Home: House Home Access: Stairs to enter [employer] of Steps: 3 Entrance Stairs-Rails: None Home Layout: One level     Bathroom Shower/Tub: Tub/shower unit;Walk-in shower   Bathroom Toilet: Handicapped height     Home Equipment: None          Prior Functioning/Environment Level of Independence: Independent        Comments: drives, very active        OT Problem List: Decreased range of motion;Decreased strength;Decreased activity tolerance;Impaired balance (sitting and/or standing);Impaired vision/perception;Decreased coordination;Decreased cognition;Decreased safety awareness;Decreased knowledge of use of DME or AE;Decreased knowledge of precautions;Impaired UE functional use      OT Treatment/Interventions: Self-care/ADL training;Neuromuscular education;Therapeutic exercise;DME and/or AE instruction;Therapeutic activities;Balance training;Patient/family education    OT Goals(Current goals can be found in the care plan section) Acute Rehab OT Goals Patient Stated Goal: to regain use of L UE - spouse asking when she can drive again. PT and spouse advised against all driving at  [facility] stime due to reaction speed and impaired L UE use OT Goal Formulation: With patient/family Time For Goal Achievement: 12/15/16 Potential to Achieve Goals: Good  OT Frequency: Min 3X/week   Barriers to D/C:            Co-evaluation              End of Session Equipment Utilized During Treatment: Gait belt Nurse Communication: Mobility status;Precautions  Activity Tolerance: Patient tolerated treatment well Patient left: in bed;with call bell/phone within reach;with family/visitor present  OT Visit Diagnosis: Muscle weakness (generalized) (M62.81)                ADL either performed or assessed with clinical judgement  Time: 1100-1133 OT Time Calculation (min): 33 min Charges:  OT General Charges $OT Visit: 1 Procedure OT Evaluation $OT Eval Moderate Complexity: 1 Procedure OT Treatments $Self Care/Home Management : 8-22 mins G-Codes:      Jeri Modena   OTR/L Pager: [phone] Office: [phone] .   Parke Poisson B 12/01/2016, 1:39 PM

## 2016-12-02 ENCOUNTER — Encounter (HOSPITAL_COMMUNITY): Payer: Self-pay | Admitting: Neurosurgery

## 2016-12-02 DIAGNOSIS — C711 Malignant neoplasm of frontal lobe: Principal | ICD-10-CM

## 2016-12-02 MED ORDER — TRAMADOL HCL 50 MG PO TABS: 50.0000 mg | ORAL_TABLET | Freq: Four times a day (QID) | ORAL | 0 refills | Status: DC | PRN

## 2016-12-02 NOTE — Discharge Summary (Signed)
Physician Discharge Summary  Patient ID: Donna Valentine MRN: [id number] DOB/AGE: 11/09/1940 76 y.o.  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 She was admitted and taken to the operating room for an uncomplicated resection of the dominant mass. There were two daughter lesions located more rostral in the frontal lobe. Diganosis was Glioblastoma Multiforme. She has had improvement in her left sided strength since the operation. Her wound is clean, dry, and without signs of infection.  Oncology and Radiation Oncology have consulted and discussed plans with her and her family.  Donna Valentine is tolerating a regular diet and ambulating.  Treatments: surgery: right frontal stereotactic craniotomy for tumor resection.   Discharge Exam: Blood pressure (!) 151/63, pulse 64, temperature 97.5 F (36.4 C), temperature source Oral, resp. rate 18, height 5\' 6"  (1.676 m), weight 79.8 kg (176 lb), SpO2 97 %. per note.   Disposition: 01-Home or Self Care Brain tumor  Allergies as of 12/02/2016      Reactions   Penicillins Other (See Comments)   Hallucination Has patient had a PCN reaction causing immediate rash, facial/tongue/throat swelling, SOB or lightheadedness with hypotension: No Has patient had a PCN reaction causing severe rash involving mucus membranes or skin necrosis: No Has patient had a PCN reaction that required hospitalization No Has patient had a PCN reaction occurring within the last 10 years: No If all of the above answers are "NO", then may proceed with Cephalosporin use.   Food    Pineapple-Nausea/vomiting      Medication List    TAKE these  medications   dexamethasone 4 MG tablet Commonly known as:  DECADRON Take 1 tablet (4 mg total) by mouth every 6 (six) hours. Every 6 hours on Sat; every 8 hours on Sun/Mon, every 12 hours on Tues/Wed   ibuprofen 200 MG tablet Commonly known as:  ADVIL,MOTRIN Take 400 mg by mouth every 8 (eight) hours as needed.   levETIRAcetam 500 MG tablet Commonly known as:  KEPPRA Take 1 tablet (500 mg total) by mouth 2 (two) times daily.   loratadine 10 MG tablet Commonly known as:  CLARITIN Take 10 mg by mouth daily as needed for allergies.   traMADol 50 MG tablet Commonly known as:  ULTRAM Take 1 tablet (50 mg total) by mouth every 6 (six) hours as needed.   Vitamin D3 50000 units Caps Take 1 capsule by mouth once a week.            Durable Medical Equipment        Start     Ordered   12/02/16 1053  For home use only DME Walker rolling  Once    Question:  Patient needs a walker to treat with the following condition  Answer:  S/P brain surgery   12/02/16 1053     Follow-up Information    Veasna Santibanez L, MD. Call in 1 week(s).   Specialty:  Neurosurgery Why:  please call to make an appointment for staple removal Contact information: [address] [place] [address] [phone]           Signed: Winfield Cunas 12/02/2016, 6:34 PM

## 2016-12-02 NOTE — Discharge Instructions (Signed)
Craniotomy °Care After °Please read the instructions outlined below and refer to this sheet in the next few weeks. These discharge instructions provide you with general information on caring for yourself after you leave the hospital. Your surgeon may also give you specific instructions. While your treatment has been planned according to the most current medical practices available, unavoidable complications occasionally occur. If you have any problems or questions after discharge, please call your surgeon. °Although there are many types of brain surgery, recovery following craniotomy (surgical opening of the skull) is much the same for each. However, recovery depends on many factors. These include the type and severity of brain injury and the type of surgery. It also depends on any nervous system function problems (neurological deficits) before surgery. If the craniotomy was done for cancer, chemotherapy and radiation could follow. You could be in the hospital from 5 days to a couple weeks. This depends on the type of surgery, findings, and whether there are complications. °HOME CARE INSTRUCTIONS  °· It is not unusual to hear a clicking noise after a craniotomy, the plates and screws used to attach the bone flap can sometimes cause this. It is a normal occurrence if this does happen °· Do not drive for 10 days after the operation °· Your scalp may feel spongy for a while, because of fluid under it. This will gradually get better. Occasionally, the surgeon will not replace the bone that was removed to access the brain. If there is a bony defect, the surgeon will ask you to wear a helmet for protection. This is a discussion you should have with your surgeon prior to leaving the hospital (discharge). °· Numbness may persist in some areas of your scalp. °· Take all medications as directed. Sometimes steroids to control swelling are prescribed. Anticonvulsants to prevent seizures may also be given. Do not use alcohol,  other drugs, or medications unless your surgeon says it is OK. °· Keep the wound dry and clean. The wound may be washed gently with soap and water. Then, you may gently blot or dab it dry, without rubbing. Do not take baths, use swimming pools or hot tubs for 10 days, or as instructed by your caregiver. It is best to wait to see you surgeon at your first postoperative visit, and to get directions at that time. °· Only take over-the-counter or prescription medicines for pain, discomfort, or fever as directed by your caregiver. °· You may continue your normal diet, as directed. °· Walking is OK for exercise. Wait at least 3 months before you return to mild, non-contact sports or as your surgeon suggests. Contact sports should be avoided for at least 1 year, unless your surgeon says it is OK. °· If you are prescribed steroids, take them exactly as prescribed. If you start having a decrease in nervous system functions (neurological deficits) and headaches as the dose of steroids is reduced, tell your surgeon right away. °· When the anticonvulsant prescription is finished you no longer need to take it. °SEEK IMMEDIATE MEDICAL CARE IF:  °· You develop nausea, vomiting, severe headaches, confusion, or you have a seizure. °· You develop chest pain, a stiff neck, or difficulty breathing. °· There is redness, swelling, or increasing pain in the wound or pin insertion sites. °· You have an increase in swelling or bruising around the eyes. °· There is drainage or pus coming from the wound. °· You have an oral temperature above 102° F (38.9° C), not controlled by medicine. °·   You notice a foul smell coming from the wound or dressing. °· The wound breaks open (edges not staying together) after the stitches have been removed. °· You develop dizziness or fainting while standing. °· You develop a rash. °· You develop any reaction or side effects to the medications given. °Document Released: 12/30/2005 Document Revised: 12/22/2011  Document Reviewed: 10/08/2009 °[employer]® Patient Information ©2013 [employer]. ° °

## 2016-12-02 NOTE — Care Management Note (Signed)
Case Management Note  Patient Details  Name: Donna Valentine MRN: [id number] Date of Birth: [birth year]  Subjective/Objective:                    Action/Plan: Plan when patient is medically ready is home with family. Pt with orders for rolling walker and pt feels she needs at home. Jermaine with [facility] notified and will deliver the equipment to the room.  PT/OT recommending outpatient therapy. Pt is interested in this also. Message left for MD to see if he would like at d/c. CM following.   Expected Discharge Date:                  Expected Discharge Plan:  Home/Self Care  In-House Referral:     Discharge planning Services  CM Consult  Post Acute Care Choice:  Durable Medical Equipment Choice offered to:  Patient  DME Arranged:  Walker rolling DME Agency:  [place]:    [place] Agency:     Status of Service:  In process, will continue to follow  If discussed at Long Length of Stay Meetings, dates discussed:    Additional Comments:  Pollie Friar, RN 12/02/2016, 11:14 AM

## 2016-12-02 NOTE — Progress Notes (Signed)
Patient discharged. Discharge instructions given to family and patient. All questions answered. IV removed. Patient transported to car in wheelchair by NT.

## 2016-12-02 NOTE — Consult Note (Signed)
Marland Kitchen    HEMATOLOGY/ONCOLOGY CONSULTATION NOTE  Date of Service: 12/02/2016  Patient Care Team: Sharilyn Sites, MD as PCP - General (Family Medicine)  CHIEF COMPLAINTS/PURPOSE OF CONSULTATION:  Newly diagnosed GBM  HISTORY OF PRESENTING ILLNESS:   Donna Valentine is a wonderful 76 y.o. female who has been referred to [place] by Dr Ashok Pall, MD  for evaluation and management of newly diagnosed Glioblastoma Multiforme.  Patient has a h/o HLD, PAC's and borderline HTN presented to the ED on 11/20/2016 with sudden onset of left arm and some leg weakness and on CT head R frontoparietal lesion of 2.2 cm w/ vasogenic edema which was further evaluated with MRI. 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 No findings concerning for bleed or infarct.  Patient was treated with high dose steroids and keppra for SZ prophylaxis. Patient subsequently had an uncomplicated right frontal stereotactic craniotomy for tumor resection of the dominant rt frontoparietal mass but not all the satellite lesions (2 were apparently clinically evident as per surgery notes). She has had improvement in her left sided strength since her surgery and he cranial wound has been dry and clean. She is ambulating with walker and eating well. She has been setup for home Physical therapy.  I met with the patient as she was readied to be discharged home. She was seen with several family members at bedside. We discussed the diagnosis , prognosis and standard of care treatment options available and option to go to [facility] for consideration of possible clinical trials. NCCN guidelines were provided and discussed.  Patient has not been evaluated by radiation oncology yet.  Patient notes that she did not have any significant health limitation prior to the diagnosis of her GBM.   MEDICAL HISTORY:  Past Medical  History:  Diagnosis Date  . Cancer [facility])     SURGICAL HISTORY: Past Surgical History:  Procedure Laterality Date  . APPENDECTOMY  1994  . APPLICATION OF CRANIAL NAVIGATION N/A 11/28/2016   Procedure: APPLICATION OF CRANIAL NAVIGATION;  Surgeon: Ashok Pall, MD;  Location: [place];  Service: Neurosurgery;  Laterality: N/A;  . CESAREAN SECTION     x3  . CRANIOTOMY Right 11/28/2016   Procedure: STERIOTACTIC PARIETAL CRANIOTOMY FOR RIGHT FRONTAL TUMOR RESECTION WITH BRAINLAB;  Surgeon: Ashok Pall, MD;  Location: [place];  Service: Neurosurgery;  Laterality: Right;    SOCIAL HISTORY: Social History   Social History  . Marital status: Married    Spouse name: N/A  . Number of children: N/A  . Years of education: N/A   Occupational History  . Not on file.   Social History Main Topics  . Smoking status: Former Smoker    Quit date: 11/26/1991  . Smokeless tobacco: Never Used  . Alcohol use No  . Drug use: No  . Sexual activity: Not on file   Other Topics Concern  . Not on file   Social History Narrative  . No narrative on file    FAMILY HISTORY: History reviewed. No pertinent family history.  ALLERGIES:  is allergic to penicillins and food.  MEDICATIONS:  No current facility-administered medications for this encounter.    Current Outpatient Prescriptions  Medication Sig Dispense Refill  . dexamethasone (DECADRON) 4 MG tablet Take 1 tablet (4 mg total) by mouth every 6 (six) hours. Every 6 hours on Sat; every 8 hours on Sun/Mon, every 12 hours on Tues/Wed  16 tablet 0  . ibuprofen (ADVIL,MOTRIN) 200 MG tablet Take 400 mg by mouth every 8 (eight) hours as needed.    . levETIRAcetam (KEPPRA) 500 MG tablet Take 1 tablet (500 mg total) by mouth 2 (two) times daily. 60 tablet 1  . loratadine (CLARITIN) 10 MG tablet Take 10 mg by mouth daily as needed for allergies.    . Cholecalciferol (VITAMIN D3) 50000 units CAPS Take 1 capsule by mouth once a week.  0  . traMADol (ULTRAM) 50  MG tablet Take 1 tablet (50 mg total) by mouth every 6 (six) hours as needed. 30 tablet 0    REVIEW OF SYSTEMS:    10 Point review of Systems was done is negative except as noted above.  PHYSICAL EXAMINATION: ECOG PERFORMANCE STATUS: 1 - Symptomatic but completely ambulatory  . Vitals:   12/02/16 1402 12/02/16 1723  BP: (!) 149/69 (!) 151/63  Pulse: (!) 55 64  Resp: 19 18  Temp: 98.6 F (37 C) 97.5 F (36.4 C)   Filed Weights   11/28/16 0633 11/28/16 0635  Weight: 176 lb (79.8 kg) 176 lb (79.8 kg)   .Body mass index is 28.41 kg/m.  GENERAL:alert, in no acute distress and comfortable SKIN: skin color, texture, turgor are normal, no rashes or significant lesions EYES: normal, conjunctiva are pink and non-injected, sclera clear OROPHARYNX:no exudate, no erythema and lips, buccal mucosa, and tongue normal  NECK: supple, no JVD, thyroid normal size, non-tender, without nodularity LYMPH:  no palpable lymphadenopathy in the cervical, axillary or inguinal LUNGS: clear to auscultation with normal respiratory effort HEART: regular rate & rhythm,  no murmurs and no lower extremity edema ABDOMEN: abdomen soft, non-tender, normoactive bowel sounds  Musculoskeletal: no cyanosis of digits and no clubbing  PSYCH: alert & oriented x 3 with fluent speech NEURO:4/5 LUE strength, 4+/5 LLE strength, 5/5 on rt side UE and LE  LABORATORY DATA:  I have reviewed the data as listed  . CBC Latest Ref Rng & Units 11/29/2016 11/20/2016 11/20/2016  WBC 4.0 - 10.5 K/uL 13.9(H) - 7.1  Hemoglobin 12.0 - 15.0 g/dL 14.5 15.6(H) 14.2  Hematocrit 36.0 - 46.0 % 44.1 46.0 43.8  Platelets 150 - 400 K/uL 194 - 233    . CMP Latest Ref Rng & Units 11/29/2016 11/20/2016 11/20/2016  Glucose 65 - 99 mg/dL - 111(H) 111(H)  BUN 6 - 20 mg/dL - 11 9  Creatinine 0.44 - 1.00 mg/dL 0.75 0.80 0.82  Sodium 135 - 145 mmol/L - 143 139  Potassium 3.5 - 5.1 mmol/L - 3.6 3.6  Chloride 101 - 111 mmol/L - 104 103  CO2 22 - 32  mmol/L - - 26  Calcium 8.9 - 10.3 mg/dL - - 9.3  Total Protein 6.5 - 8.1 g/dL - - 8.4(H)  Total Bilirubin 0.3 - 1.2 mg/dL - - 0.6  Alkaline Phos 38 - 126 U/L - - 86  AST 15 - 41 U/L - - 28  ALT 14 - 54 U/L - - 19     RADIOGRAPHIC STUDIES: I have personally reviewed the radiological images as listed and agreed with the findings in the report. Dg Chest 2 View  Result Date: 11/19/2016 CLINICAL DATA:  Productive cough for the past 2 weeks. Former smoker. EXAM: CHEST  2 VIEW COMPARISON:  Chest x-ray of July 10, 2010 FINDINGS: The lungs are well-expanded. There is no focal infiltrate. There is a pectus excavatum type chest contour. The heart and pulmonary vascularity are normal. The mediastinum  is normal in width. There is no pleural effusion. There is moderate dextrocurvature centered in the lower thoracic spine which is stable. IMPRESSION: Mild chronic bronchitic changes, stable. No pneumonia, pulmonary edema, nor other acute cardiopulmonary abnormality. Electronically Signed   By: David  Martinique M.D.   On: 11/19/2016 14:26   Ct Head Wo Contrast  Result Date: 11/20/2016 CLINICAL DATA:  Left arm weakness. EXAM: CT HEAD WITHOUT CONTRAST TECHNIQUE: Contiguous axial images were obtained from the base of the skull through the vertex without intravenous contrast. COMPARISON:  None. FINDINGS: Brain: Large amount of edema is identified in the high right frontal parietal region. Appearance is more suggestive of vasogenic edema than cytotoxic edema on this study. Features may be related to a potential mass lesion identified in the high right parietal region measuring 2.2 cm (image 25 series 201). Despite the edema, no subfalcine midline shift is evident. No evidence for hydrocephalus. Vascular: No hyperdense vessel or unexpected calcification. Skull: Normal. Negative for fracture or focal lesion. Sinuses/Orbits: No acute finding. Other: None. IMPRESSION: 1. Relatively large volume of apparent vasogenic edema  in the high right frontoparietal region, likely secondary to a 2.2 cm high frontal parietal lobe lesion at the gray-white junction. MRI without and with contrast would be the study of choice to further evaluate. 2. No substantial mass-effect or midline shift. I discussed these findings by telephone with the triage nurse Valarie Merino) at [place] hours on 05/24/2017. Electronically Signed   By: Misty Stanley M.D.   On: 11/20/2016 18:12   Ct Head W Contrast  Result Date: 11/21/2016 CLINICAL DATA:  76 year old female. Study for stereotactic surgical planning. 2.2 cm enhancing right superior frontal lobe mass and surrounding edema vs. nonenhancing tumor. Initial encounter. EXAM: CT HEAD WITH CONTRAST TECHNIQUE: Contiguous axial images were obtained from the base of the skull through the vertex with intravenous contrast. CONTRAST:  75 mL Isovue-300 COMPARISON:  Brain MRI without and with contrast and noncontrast head CT 11/20/2016 FINDINGS: Brain: The roughly 2 cm round enhancing mass at the right superior frontal gyrus is seen on series 3, image 138. The more faint nodular enhancement anterior to the lesion is not evident by CT. The surrounding white matter hypodensity which on MRI most resembled vasogenic edema is stable. Mild regional mass effect with no midline shift. No ventriculomegaly. No loss basilar cisterns. Elsewhere gray-white matter differentiation is within normal limits. No acute intracranial hemorrhage identified. No other abnormal intracranial enhancement. Vascular: Major intracranial vascular structures are enhancing. Skull: No osseous abnormality identified. Sinuses/Orbits: Visualized paranasal sinuses and mastoids are stable and well pneumatized. Other: Negative orbit and scalp soft tissues. IMPRESSION: 1. Study for stereotactic surgical planning. 2. Enhancing mass in the right superior frontal gyrus is on series 3, image 138. Surrounding white matter hypodensity most suggestive of vasogenic edema is  stable with mild regional mass effect but no midline shift. 3. No new intracranial abnormality. Electronically Signed   By: Genevie Ann M.D.   On: 11/21/2016 13:52   Ct Head W Wo Contrast  Result Date: 11/29/2016 CLINICAL DATA:  76 year old female status post resection of right superior frontal lobe tumor, postop day 1 Initial encounter. EXAM: CT HEAD WITHOUT AND WITH CONTRAST TECHNIQUE: Contiguous axial images were obtained from the base of the skull through the vertex without and with intravenous contrast CONTRAST:  78m ISOVUE-300 IOPAMIDOL (ISOVUE-300) INJECTION 61% COMPARISON:  Preoperative stereotactic CT 11/21/2016 and earlier. FINDINGS: Brain: Small volume postoperative pneumocephalus. Small volume of gas and blood products in the right  superior frontal gyrus resection cavity (series 2, image 23). Following contrast, no mass like enhancement is evident. Surrounding white matter hypodensity with mild mass effect tracking into the more inferior right frontal lobe is stable. There is trace leftward midline shift and mild mass effect on the right lateral ventricle now. No ventriculomegaly. No other intracranial hemorrhage identified. Basilar cisterns remain patent. No other abnormal intracranial enhancement. Vascular: Major intracranial vascular structures appear to be normally enhancing. Skull: Sequelae of right frontoparietal craniotomy. No other acute osseous abnormality. Sinuses/Orbits: Visualized paranasal sinuses and mastoids are stable and well pneumatized. Other: Mild right scalp postoperative changes from craniotomy. Skin staples in place. No other acute orbit or scalp soft tissue finding. IMPRESSION: 1. Status post right superior frontal gyrus tumor resection with no adverse features. Stable right frontal lobe edema or nonenhancing tumor. No residual masslike enhancement is evident by post-contrast CT. 2. Mild postoperative intracranial mass effect with trace leftward midline shift. Electronically  Signed   By: Genevie Ann M.D.   On: 11/29/2016 16:44   Mr Jeri Cos YT Contrast  Result Date: 11/20/2016 CLINICAL DATA:  LEFT-sided weakness for 1 day, assess for stroke. EXAM: MRI HEAD WITHOUT AND WITH CONTRAST TECHNIQUE: Multiplanar, multiecho pulse sequences of the brain and surrounding structures were obtained without and with intravenous contrast. CONTRAST:  62m MULTIHANCE GADOBENATE DIMEGLUMINE 529 MG/ML IV SOLN COMPARISON:  CT HEAD November 20, 2016 at 1752 hours FINDINGS: INTRACRANIAL CONTENTS: Ring-enhancing RIGHT frontal lobe convexity intraparenchymal 2.2 x 1.6 x 1.9 cm mass extending to the dura, central subcentimeter susceptibility artifact which could reflect mineralization or hemosiderin. Corresponding reduced diffusion and low ADC values. At least 4 additional subcentimeter satellite nodules anterior to the dominant lesion in the RIGHT frontal lobe juxta cortical white matter. Extensive surrounding T2 bright vasogenic edema and regional sulcal effacement. No midline shift. Ventricles and sulci are normal for patient's age. Patchy supratentorial white matter FLAIR T2 hyperintensities exclusive of the aforementioned abnormality compatible with mild to moderate chronic small vessel ischemic disease. No abnormal extra-axial enhancement. VASCULAR: Normal major intracranial vascular flow voids present at skull base. SKULL AND UPPER CERVICAL SPINE: No abnormal sellar expansion. No suspicious calvarial bone marrow signal. Craniocervical junction maintained. SINUSES/ORBITS: The mastoid air-cells and included paranasal sinuses are well-aerated.The included ocular globes and orbital contents are non-suspicious. OTHER: None. IMPRESSION: Dominant 2.2 x 1.6 x 1.9 cm RIGHT frontal lobe mass with subcentimeter satellite nodules within extensive vasogenic edema. 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 No infarct.  Mild chronic small  vessel ischemic disease. Acute findings discussed with and reconfirmed by Dr.ROBERT LOCKWOOD on 11/20/2016 at 8:25 pm. Electronically Signed   By: CElon AlasM.D.   On: 11/20/2016 20:28    ASSESSMENT & PLAN:   76yo previously healthy caucasian female with no significant Chronic medical co-mrobidities with   1) Newly diagnosed Rt fronto-parietal GBM with a few satellite lesions. Patient has had complete resection of the dominant lesions but not the visible daughter lesions. (Neurosurgery - Dr CChristella Noa. Plan -I discussed the diagnosis, prognosis , standard treatment options and [facility] -molecular testing for MGMT status has been ordered. -given patients good PS and limited co-morbidities even though she is >70 yrs old we would offer her Concurrent temodar + RT followed by adjuvant temodar x 6 months (if neg MGMT promoter status) and consideration for long course of temodar if MGMT promoter is methylated. -recommended consideration of  2nd opinion at [facility] to be considered for clinical trials for GBM  (ph III trial with COncurrent chemo/RT + Nivolumab(MS [id number]) vs ExCel trial ) -she will be getting therapies at home. -Barring a decision by patient to not proceed with aggressive treatment we shall order her temodar and hope to start her concurrent chemotherapy + RT in the next about 2 weeks once her surgical incisions have healed. -she will need outpatient Radiation Oncology consultation. -we shal see her back in clinic in 7-10 days with labs  All of the patients questions were answered with apparent satisfaction. The patient knows to call the clinic with any problems, questions or concerns.  I spent 60 minutes counseling the patient face to face. The total time spent in the appointment was 80 minutes and more than 50% was on counseling and direct patient cares and co-ordination or cares with neuro-surgery and radiation oncology and answering  the family's several questions.    Sullivan Lone MD [place] AAHIVMS [facility] Hematology/Oncology Physician [facility]  (Office):       [phone] (Work cell):  [phone] (Fax):           [phone]  12/02/2016 7:14 PM

## 2016-12-02 NOTE — Progress Notes (Signed)
Occupational Therapy Treatment Patient Details Name: Ramisha Cardelli MRN: [id number] DOB: [birth year] Today's Date: 12/02/2016    History of present illness 76 y.o. female with a h/o of HLD, PACs, and borderline HTN who presents with acute onset of L sided weakness and numbness for 1 day. 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 s/p STERIOTACTIC PARIETAL CRANIOTOMY FOR RIGHT FRONTAL TUMOR RESECTION WITH BRAINLAB   OT comments  Pt progressing toward OT goals. Session focused on improved functional use of L UE as a precursor to ADL participation. Initiated home exercise program for L UE AROM, strengthening, and fine motor coordination. Pt with significant fatigue with fine motor exercises but does demonstrate improved digit isolation and coordination related to ADL tasks. Continue to recommend outpatient OT services for continued rehabilitation. OT will continue to follow acutely.    Follow Up Recommendations  Outpatient OT    Equipment Recommendations  None recommended by OT    Recommendations for Other Services      Precautions / Restrictions Precautions Precautions: Fall Precaution Comments: L inattention Restrictions Weight Bearing Restrictions: No       Mobility Bed Mobility Overal bed mobility: Needs Assistance Bed Mobility: Supine to Sit;Sit to Supine     Supine to sit: Supervision Sit to supine: Min guard   General bed mobility comments: OOB in chair on arrival.  Transfers Overall transfer level: Needs assistance Equipment used: Rolling walker (2 wheeled) Transfers: Sit to/from Stand Sit to Stand: Min guard         General transfer comment: for balance    Balance Overall balance assessment: Needs assistance Sitting-balance support: Feet supported Sitting balance-Leahy Scale: Good     Standing balance support: No upper extremity supported Standing balance-Leahy Scale: Fair Standing balance comment: stands static no UE support, with  ambulation wanted to hold onto me when without walker                   ADL Overall ADL's : Needs assistance/impaired                                       General ADL Comments: Session focused on L UE HEP initiation for AROM, strengthening, and fine motor coordination in preparation for ADL participation.      Vision                 Additional Comments: Will continue to assess.   Perception     Praxis      Cognition   Behavior During Therapy: WFL for tasks assessed/performed Overall Cognitive Status: Impaired/Different from baseline Area of Impairment: Awareness            Awareness: Emergent   General Comments: L inattention and decreased awareness of deficits.      Exercises General Exercises - Upper Extremity Shoulder Flexion: AROM;Left;10 reps;Seated (Reclined) Shoulder ABduction: AROM;Left;5 reps;Seated (Reclined) Elbow Flexion: AROM;Left;10 reps;Seated (Reclined) Wrist Flexion: AROM;Left;10 reps;Seated (Reclined) Digit Composite Flexion: AROM;Left;10 reps;Seated Composite Extension: AROM;Left;10 reps Hand Exercises Digit Composite Abduction: AROM;Left;10 reps;Seated Digit Lifts: AROM;Right;5 reps;Seated Thumb Abduction: AROM;Left;10 reps;Seated Thumb Adduction: AROM;Left;10 reps;Seated Opposition: AROM;Left;10 reps;Seated Hand Activities Pick Up, Palm, Put Down: Left;10 reps;Seated Pick Up, Palm, Put Down Limitations: Able to grasp one item at a time. No translation skills at this time but is able to complete voluntary release.   Shoulder Instructions       General Comments  Pertinent Vitals/ Pain       Pain Assessment: No/denies pain  Home Living                                          Prior Functioning/Environment              Frequency  Min 3X/week        Progress Toward Goals  OT Goals(current goals can now be found in the care plan section)  Progress towards OT goals:  Progressing toward goals  Acute Rehab OT Goals Patient Stated Goal: to return to independent OT Goal Formulation: With patient/family Time For Goal Achievement: 12/15/16 Potential to Achieve Goals: Good ADL Goals Pt/caregiver will Perform Home Exercise Program: Increased ROM;Increased strength;Left upper extremity;With theraputty;With written HEP provided;With minimal assist  Plan Discharge plan remains appropriate    Co-evaluation                 End of Session    OT Visit Diagnosis: Muscle weakness (generalized) (M62.81)   Activity Tolerance Patient tolerated treatment well   Patient Left in bed;with call bell/phone within reach;with family/visitor present   Nurse Communication Mobility status;Precautions        Time: [id number] OT Time Calculation (min): 41 min  Charges: OT General Charges $OT Visit: 1 Procedure OT Treatments $Therapeutic Activity: 8-22 mins $Therapeutic Exercise: 23-37 mins  Norman Herrlich, MS OTR/L  Pager: [place] A Roberth Berling 12/02/2016, 2:08 PM

## 2016-12-02 NOTE — Evaluation (Signed)
Physical Therapy Evaluation & Discharge Patient Details Name: Donna Valentine MRN: [id number] DOB: 02/16/1941 Today's Date: 12/02/2016   History of Present Illness  76 y.o. female with a h/o of HLD, PACs, and borderline HTN who presents with acute onset of L sided weakness and numbness for 1 day. 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 s/p STERIOTACTIC PARIETAL CRANIOTOMY FOR RIGHT FRONTAL TUMOR RESECTION WITH BRAINLAB  Clinical Impression  Patient presents with decreased balance and L side awareness and was seen with family present for education on safety, stairs, balance and fall prevention.  Feel she can go home safely with family support and follow up outpatient PT at d/c.  Noted plans for d/c home today so will defer further treatment to the outpatient setting.    Follow Up Recommendations Outpatient PT    Equipment Recommendations  Rolling walker with 5" wheels    Recommendations for Other Services       Precautions / Restrictions Precautions Precautions: Fall Precaution Comments: L inattention      Mobility  Bed Mobility Overal bed mobility: Needs Assistance Bed Mobility: Supine to Sit;Sit to Supine     Supine to sit: Supervision Sit to supine: Min guard   General bed mobility comments: used rail to come upright with elevated HOB, to supine assist for positioning in bed  Transfers Overall transfer level: Needs assistance Equipment used: Rolling walker (2 wheeled) Transfers: Sit to/from Stand Sit to Stand: Min guard         General transfer comment: for balance  Ambulation/Gait Ambulation/Gait assistance: Supervision Ambulation Distance (Feet): 250 Feet Assistive device: Rolling walker (2 wheeled) Gait Pattern/deviations: Step-through pattern;Decreased stride length     General Gait Details: cuse for L hand on walker, for manuevering around door facings on L side, for sequence on stairs; attempted about 43' with SPC on R with much  slower pace, stops when people walking around her and needing assist/cues for sequence  Stairs Stairs: Yes Stairs assistance: Min assist Stair Management: No rails (HHA on R) Number of Stairs: 2 General stair comments: cues for sequence and assist for balance/safety  Wheelchair Mobility    Modified Rankin (Stroke Patients Only)       Balance Overall balance assessment: Needs assistance Sitting-balance support: Feet supported Sitting balance-Leahy Scale: Good     Standing balance support: No upper extremity supported Standing balance-Leahy Scale: Fair Standing balance comment: stands static no UE support, with ambulation wanted to hold onto me when without walker                             Pertinent Vitals/Pain Pain Assessment: No/denies pain    Home Living Family/patient expects to be discharged to:: Private residence Living Arrangements: Spouse/significant other Available Help at Discharge: Family;Available 24 hours/day Type of Home: House Home Access: Stairs to enter Entrance Stairs-Rails: None Entrance Stairs-Number of Steps: 3 Home Layout: One level Home Equipment: None      Prior Function Level of Independence: Independent         Comments: drives, very active     Hand Dominance   Dominant Hand: Right    Extremity/Trunk Assessment   Upper Extremity Assessment Upper Extremity Assessment: Defer to OT evaluation    Lower Extremity Assessment Lower Extremity Assessment: LLE deficits/detail LLE Deficits / Details: AROM WFL, strength hip flex 4-/5, knee extension 4+/5, ankle DF 4+/5, reports intact sensation, but functionally decr L side awareness    Cervical / Trunk  Assessment Cervical / Trunk Assessment: Normal  Communication   Communication: No difficulties  Cognition Arousal/Alertness: Awake/alert Behavior During Therapy: WFL for tasks assessed/performed Overall Cognitive Status: Impaired/Different from baseline Area of  Impairment: Awareness           Awareness: Emergent   General Comments: pt demonstrates L inattention during evaluation. pt lacks awareness to balance deficits    General Comments General comments (skin integrity, edema, etc.): Educated family on cueing pt for attention to L side, for assist level on stairs and fall prevention in the home.  Pt. able to recall all activities OT educated on for L hand work at home.     Exercises     Assessment/Plan    PT Assessment All further PT needs can be met in the next venue of care  PT Problem List Decreased strength;Decreased balance;Impaired sensation;Decreased safety awareness;Decreased mobility       PT Treatment Interventions      PT Goals (Current goals can be found in the Care Plan section)  Acute Rehab PT Goals Patient Stated Goal: to return to independent PT Goal Formulation: All assessment and education complete, DC therapy    Frequency     Barriers to discharge        Co-evaluation               End of Session Equipment Utilized During Treatment: Gait belt Activity Tolerance: Patient tolerated treatment well Patient left: in bed;with call bell/phone within reach;with family/visitor present   PT Visit Diagnosis: Unsteadiness on feet (R26.81);Muscle weakness (generalized) (M62.81);Other abnormalities of gait and mobility (R26.89)         Time: 1115-5208 PT Time Calculation (min) (ACUTE ONLY): 26 min   Charges:   PT Evaluation $PT Eval Moderate Complexity: 1 Procedure PT Treatments $Gait Training: 8-22 mins   PT G Codes:         Reginia Naas Dec 15, 2016, 10:20 AM  Magda Kiel, PT [phone] December 15, 2016

## 2016-12-02 NOTE — Care Management Important Message (Signed)
Important Message  Patient Details  Name: Donna Valentine MRN: [id number] Date of Birth: 09/07/1941   Medicare Important Message Given:  Yes    Quintavious Rinck [address] 12/02/2016, 11:33 AM

## 2016-12-03 ENCOUNTER — Other Ambulatory Visit: Payer: Self-pay | Admitting: Hematology

## 2016-12-03 ENCOUNTER — Telehealth: Payer: Self-pay | Admitting: Hematology

## 2016-12-03 ENCOUNTER — Other Ambulatory Visit: Payer: Self-pay | Admitting: *Deleted

## 2016-12-03 ENCOUNTER — Encounter: Payer: Self-pay | Admitting: Radiation Oncology

## 2016-12-03 DIAGNOSIS — Z7189 Other specified counseling: Secondary | ICD-10-CM

## 2016-12-03 DIAGNOSIS — C719 Malignant neoplasm of brain, unspecified: Secondary | ICD-10-CM

## 2016-12-03 DIAGNOSIS — C711 Malignant neoplasm of frontal lobe: Secondary | ICD-10-CM

## 2016-12-03 MED ORDER — TEMOZOLOMIDE 140 MG PO CAPS: 75.0000 mg/m2/d | ORAL_CAPSULE | Freq: Every day | ORAL | 0 refills | Status: DC

## 2016-12-03 NOTE — Telephone Encounter (Signed)
[facility] f/u appt has been scheduled for the pt to see Dr. Irene Limbo on 3/5 at 4pm. Appt was given to the pt's daughter. Aware to arrive 30 minutes early.

## 2016-12-05 ENCOUNTER — Telehealth: Payer: Self-pay | Admitting: Pharmacist

## 2016-12-05 ENCOUNTER — Encounter: Payer: Self-pay | Admitting: *Deleted

## 2016-12-05 DIAGNOSIS — C719 Malignant neoplasm of brain, unspecified: Secondary | ICD-10-CM

## 2016-12-05 MED ORDER — TEMOZOLOMIDE 140 MG PO CAPS: 75.0000 mg/m2/d | ORAL_CAPSULE | Freq: Every day | ORAL | 0 refills | Status: DC

## 2016-12-05 NOTE — Progress Notes (Addendum)
RN faxed patient information for clinical trial consultation. Faxed to [phone]. Phone number: [phone]. Davonna Belling MD office

## 2016-12-05 NOTE — Telephone Encounter (Signed)
Oral Chemotherapy Pharmacist Encounter  Received new prescription for Temodar for new diagnosed glioblastoma multiforme 2/17 CBC and 2/8 CMET reviewed, OK for treatment Current medication list in Epic assessed, no DDIs with Temodar identified  Noted patient with [facility] Medicare for insurance. We will e-scribe prescription to [facility] in [place], [place] (ph: [phone]) for benefits analysis.  Oral Oncology Clinic will continue to follow. Noted radiation oncology consult on 2/26.  Johny Drilling, PharmD, BCPS, BCOP 12/05/2016  10:06 AM Oral Oncology Clinic [phone]

## 2016-12-08 ENCOUNTER — Ambulatory Visit
Admission: RE | Admit: 2016-12-08 | Discharge: 2016-12-08 | Disposition: A | Discharge status: Home / Self Care | Payer: Medicare Other | Source: Ambulatory Visit | Attending: Radiation Oncology | Admitting: Radiation Oncology

## 2016-12-08 ENCOUNTER — Encounter: Payer: Self-pay | Admitting: Radiation Oncology

## 2016-12-08 VITALS — BP 123/70 | HR 61 | Temp 98.2°F | Resp 18 | Ht 66.0 in | Wt 176.0 lb

## 2016-12-08 DIAGNOSIS — Z87891 Personal history of nicotine dependence: Secondary | ICD-10-CM | POA: Insufficient documentation

## 2016-12-08 DIAGNOSIS — C711 Malignant neoplasm of frontal lobe: Secondary | ICD-10-CM | POA: Diagnosis not present

## 2016-12-08 DIAGNOSIS — Z880 Allergy status to penicillin: Secondary | ICD-10-CM | POA: Insufficient documentation

## 2016-12-08 DIAGNOSIS — Z510 Encounter for antineoplastic radiation therapy: Secondary | ICD-10-CM | POA: Diagnosis not present

## 2016-12-08 DIAGNOSIS — Z79899 Other long term (current) drug therapy: Secondary | ICD-10-CM | POA: Diagnosis not present

## 2016-12-08 DIAGNOSIS — C719 Malignant neoplasm of brain, unspecified: Secondary | ICD-10-CM

## 2016-12-08 NOTE — Progress Notes (Signed)
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  Past or anticipated interventions, if any, per neurosurgery: craniotomy 11/28/16  Past or anticipated interventions, if any, per medical oncology: Temodar prescribed but, no yet started  Dose of Decadron, if applicable: decadron 4 mg daily  Recent neurologic symptoms, if any:   Seizures: No but, taking Keppra 500 mg bid  Headaches: No  Nausea: No  Dizziness/ataxia: No  Difficulty with hand coordination: left hand continues to be weak but, right hand functions normally. Patient scheduled to begin OT soon.   Focal numbness/weakness: no  Visual deficits/changes: sees well with glasses  Confusion/Memory deficits: no  Ambulatory  Painful bone metastases at present, if any: no  SAFETY ISSUES:  Prior radiation? no  Pacemaker/ICD? no  Possible current pregnancy? no  Is the patient on methotrexate? no  Additional Complaints / other details: 76 year old female. Married. Scheduled to have transverse staples at [facility] from craniotomy removed on Thursday.

## 2016-12-08 NOTE — Progress Notes (Signed)
See progress note under physician encounter. 

## 2016-12-08 NOTE — Progress Notes (Signed)
[facility]         [phone] ________________________________  Initial outpatient Consultation  Name: Donna Valentine MRN: [id number]  Date: 12/08/2016  DOB: 05-27-1941  [id number], Betsy Coder, MD  Brunetta Genera, MD   REFERRING PHYSICIAN: Brunetta Genera, MD  DIAGNOSIS: 76 year old woman post partial resection of a multifocal 2.2 cm glioblastoma of the right frontal lobe    ICD-9-CM ICD-10-CM   1. Malignant neoplasm of frontal lobe of brain (HCC) 191.1 C71.1   2. Glioblastoma multiforme of frontal lobe (HCC) 191.1 C71.1       HISTORY OF PRESENT ILLNESS: Donna Valentine is a 76 y.o. female seen at the request of Dr. Irene Limbo. Patient was admitted to the [facility] ED on 11/20/16 for a 2 day history of left sided weakness concerning for CVA. MRI of brain revealed a 2 x 2 cm right frontal lobe lesion with vasogenic edema, and two subcentimeter satellite nodules. The patient was evaluated for signs of possible extracranial primary tumor. New brain mass differential includes glioblastoma, CNS lymphoma and metastasis "though currently there does not appear to be any signs of an extracranial tumor". Patient underwent an elective craniotomy with biopsy on 11/28/16. CT of head on 11/29/16 showed stable right frontal lobe edema or nonenhancing tumor. No residual mass-like enhancement is evident. She was discharged on 12/02/16.   Pathology shows glioblastoma, molecular tests pending.  PREVIOUS RADIATION THERAPY: No  PAST MEDICAL HISTORY:  Past Medical History:  Diagnosis Date  . Brain cancer ([place])    [facility]  . Cancer [facility])       PAST SURGICAL HISTORY: Past Surgical History:  Procedure Laterality Date  . APPENDECTOMY  1994  . APPLICATION OF CRANIAL NAVIGATION N/A 11/28/2016   Procedure: APPLICATION OF CRANIAL NAVIGATION;  Surgeon: Ashok Pall, MD;  Location: [place];  Service: Neurosurgery;  Laterality: N/A;  . CESAREAN SECTION     x3  . CRANIOTOMY Right 11/28/2016    Procedure: STERIOTACTIC PARIETAL CRANIOTOMY FOR RIGHT FRONTAL TUMOR RESECTION WITH BRAINLAB;  Surgeon: Ashok Pall, MD;  Location: [place];  Service: Neurosurgery;  Laterality: Right;    FAMILY HISTORY:  Family History  Problem Relation Age of Onset  . Cancer Neg Hx     SOCIAL HISTORY:  Social History   Social History  . Marital status: Married    Spouse name: N/A  . Number of children: N/A  . Years of education: N/A   Occupational History  . Not on file.   Social History Main Topics  . Smoking status: Former Smoker    Quit date: 11/26/1991  . Smokeless tobacco: Never Used  . Alcohol use No  . Drug use: No  . Sexual activity: Not Currently   Other Topics Concern  . Not on file   Social History Narrative  . No narrative on file    ALLERGIES: Penicillins and Food  MEDICATIONS:  Current Outpatient Prescriptions  Medication Sig Dispense Refill  . dexamethasone (DECADRON) 4 MG tablet Take 1 tablet (4 mg total) by mouth every 6 (six) hours. Every 6 hours on Sat; every 8 hours on Sun/Mon, every 12 hours on Tues/Wed 16 tablet 0  . levETIRAcetam (KEPPRA) 500 MG tablet Take 1 tablet (500 mg total) by mouth 2 (two) times daily. 60 tablet 1  . Cholecalciferol (VITAMIN D3) 50000 units CAPS Take 1 capsule by mouth once a week.  0  . ibuprofen (ADVIL,MOTRIN) 200 MG tablet Take 400 mg by mouth every 8 (eight) hours  as needed.    . loratadine (CLARITIN) 10 MG tablet Take 10 mg by mouth daily as needed for allergies.    Marland Kitchen temozolomide (TEMODAR) 140 MG capsule Take 1 capsule (140 mg total) by mouth daily. May take on an empty stomach or at bedtime to decrease nausea & vomiting. (Patient not taking: Reported on 12/08/2016) 42 capsule 0  . traMADol (ULTRAM) 50 MG tablet Take 1 tablet (50 mg total) by mouth every 6 (six) hours as needed. (Patient not taking: Reported on 12/08/2016) 30 tablet 0   No current facility-administered medications for this encounter.     REVIEW OF SYSTEMS:  On  review of systems, the patient reports that she is doing well overall. She denies any chest pain, shortness of breath, cough, fevers, chills, night sweats, unintended weight changes. She denies any bowel or bladder disturbances, and denies abdominal pain, nausea or vomiting. She denies any new musculoskeletal or joint aches or pains. A complete review of systems is obtained and is otherwise negative.  Patient denies seizures but notes taking Keppra 500 mg bid. She denies headaches, nausea, dizziness, confusion, or focal numbness. She notes left hand continues to be weak but right hand functions normally. She is scheduled for occupational therapy.   PHYSICAL EXAM:  Wt Readings from Last 3 Encounters:  12/08/16 176 lb (79.8 kg)  11/28/16 176 lb (79.8 kg)  11/25/16 176 lb (79.8 kg)   Temp Readings from Last 3 Encounters:  12/08/16 98.2 F (36.8 C) (Oral)  12/02/16 97.5 F (36.4 C) (Oral)  11/25/16 97.4 F (36.3 C)   BP Readings from Last 3 Encounters:  12/08/16 123/70  12/02/16 (!) 151/63  11/25/16 (!) 143/64   Pulse Readings from Last 3 Encounters:  12/08/16 61  12/02/16 64  11/25/16 63   Pain Assessment Pain Score: 0-No pain0/10  In general this is a well appearing caucasian woman in no acute distress. She's alert and oriented x4 and appropriate throughout the examination. Cardiopulmonary assessment is negative for acute distress and she exhibits normal effort. Scalp staples in place, no infection of incision.  Speech fluent, articulate, affect appropriate, neuro grossly intact, normal gait.  KPS = 80  100 - Normal; no complaints; no evidence of disease. 90   - Able to carry on normal activity; minor signs or symptoms of disease. 80   - Normal activity with effort; some signs or symptoms of disease. 7   - Cares for self; unable to carry on normal activity or to do active work. 60   - Requires occasional assistance, but is able to care for most of his personal needs. 50   -  Requires considerable assistance and frequent medical care. 81   - Disabled; requires special care and assistance. 50   - Severely disabled; hospital admission is indicated although death not imminent. 65   - Very sick; hospital admission necessary; active supportive treatment necessary. 10   - Moribund; fatal processes progressing rapidly. 0     - Dead  Karnofsky DA, Abelmann [place], Craver LS and Burchenal [facility] [phone]) The use of the nitrogen mustards in the palliative treatment of carcinoma: with particular reference to bronchogenic carcinoma Cancer 1 634-56  LABORATORY DATA:  Lab Results  Component Value Date   WBC 13.9 (H) 11/29/2016   HGB 14.5 11/29/2016   HCT 44.1 11/29/2016   MCV 86.3 11/29/2016   PLT 194 11/29/2016   Lab Results  Component Value Date   NA 143 11/20/2016   K 3.6 11/20/2016  CL 104 11/20/2016   CO2 26 11/20/2016   Lab Results  Component Value Date   ALT 19 11/20/2016   AST 28 11/20/2016   ALKPHOS 86 11/20/2016   BILITOT 0.6 11/20/2016     RADIOGRAPHY: Dg Chest 2 View  Result Date: 11/19/2016 CLINICAL DATA:  Productive cough for the past 2 weeks. Former smoker. EXAM: CHEST  2 VIEW COMPARISON:  Chest x-ray of July 10, 2010 FINDINGS: The lungs are well-expanded. There is no focal infiltrate. There is a pectus excavatum type chest contour. The heart and pulmonary vascularity are normal. The mediastinum is normal in width. There is no pleural effusion. There is moderate dextrocurvature centered in the lower thoracic spine which is stable. IMPRESSION: Mild chronic bronchitic changes, stable. No pneumonia, pulmonary edema, nor other acute cardiopulmonary abnormality. Electronically Signed   By: David  Martinique M.D.   On: 11/19/2016 14:26   Ct Head Wo Contrast  Result Date: 11/20/2016 CLINICAL DATA:  Left arm weakness. EXAM: CT HEAD WITHOUT CONTRAST TECHNIQUE: Contiguous axial images were obtained from the base of the skull through the vertex without intravenous  contrast. COMPARISON:  None. FINDINGS: Brain: Large amount of edema is identified in the high right frontal parietal region. Appearance is more suggestive of vasogenic edema than cytotoxic edema on this study. Features may be related to a potential mass lesion identified in the high right parietal region measuring 2.2 cm (image 25 series 201). Despite the edema, no subfalcine midline shift is evident. No evidence for hydrocephalus. Vascular: No hyperdense vessel or unexpected calcification. Skull: Normal. Negative for fracture or focal lesion. Sinuses/Orbits: No acute finding. Other: None. IMPRESSION: 1. Relatively large volume of apparent vasogenic edema in the high right frontoparietal region, likely secondary to a 2.2 cm high frontal parietal lobe lesion at the gray-white junction. MRI without and with contrast would be the study of choice to further evaluate. 2. No substantial mass-effect or midline shift. I discussed these findings by telephone with the triage nurse Valarie Merino) at [place] hours on 05/24/2017. Electronically Signed   By: Misty Stanley M.D.   On: 11/20/2016 18:12   Ct Head W Contrast  Result Date: 11/21/2016 CLINICAL DATA:  76 year old female. Study for stereotactic surgical planning. 2.2 cm enhancing right superior frontal lobe mass and surrounding edema vs. nonenhancing tumor. Initial encounter. EXAM: CT HEAD WITH CONTRAST TECHNIQUE: Contiguous axial images were obtained from the base of the skull through the vertex with intravenous contrast. CONTRAST:  75 mL Isovue-300 COMPARISON:  Brain MRI without and with contrast and noncontrast head CT 11/20/2016 FINDINGS: Brain: The roughly 2 cm round enhancing mass at the right superior frontal gyrus is seen on series 3, image 138. The more faint nodular enhancement anterior to the lesion is not evident by CT. The surrounding white matter hypodensity which on MRI most resembled vasogenic edema is stable. Mild regional mass effect with no midline shift. No  ventriculomegaly. No loss basilar cisterns. Elsewhere gray-white matter differentiation is within normal limits. No acute intracranial hemorrhage identified. No other abnormal intracranial enhancement. Vascular: Major intracranial vascular structures are enhancing. Skull: No osseous abnormality identified. Sinuses/Orbits: Visualized paranasal sinuses and mastoids are stable and well pneumatized. Other: Negative orbit and scalp soft tissues. IMPRESSION: 1. Study for stereotactic surgical planning. 2. Enhancing mass in the right superior frontal gyrus is on series 3, image 138. Surrounding white matter hypodensity most suggestive of vasogenic edema is stable with mild regional mass effect but no midline shift. 3. No new intracranial abnormality. Electronically Signed  By: Genevie Ann M.D.   On: 11/21/2016 13:52   Ct Head W Wo Contrast  Result Date: 11/29/2016 CLINICAL DATA:  76 year old female status post resection of right superior frontal lobe tumor, postop day 1 Initial encounter. EXAM: CT HEAD WITHOUT AND WITH CONTRAST TECHNIQUE: Contiguous axial images were obtained from the base of the skull through the vertex without and with intravenous contrast CONTRAST:  58mL ISOVUE-300 IOPAMIDOL (ISOVUE-300) INJECTION 61% COMPARISON:  Preoperative stereotactic CT 11/21/2016 and earlier. FINDINGS: Brain: Small volume postoperative pneumocephalus. Small volume of gas and blood products in the right superior frontal gyrus resection cavity (series 2, image 23). Following contrast, no mass like enhancement is evident. Surrounding white matter hypodensity with mild mass effect tracking into the more inferior right frontal lobe is stable. There is trace leftward midline shift and mild mass effect on the right lateral ventricle now. No ventriculomegaly. No other intracranial hemorrhage identified. Basilar cisterns remain patent. No other abnormal intracranial enhancement. Vascular: Major intracranial vascular structures appear to  be normally enhancing. Skull: Sequelae of right frontoparietal craniotomy. No other acute osseous abnormality. Sinuses/Orbits: Visualized paranasal sinuses and mastoids are stable and well pneumatized. Other: Mild right scalp postoperative changes from craniotomy. Skin staples in place. No other acute orbit or scalp soft tissue finding. IMPRESSION: 1. Status post right superior frontal gyrus tumor resection with no adverse features. Stable right frontal lobe edema or nonenhancing tumor. No residual masslike enhancement is evident by post-contrast CT. 2. Mild postoperative intracranial mass effect with trace leftward midline shift. Electronically Signed   By: Genevie Ann M.D.   On: 11/29/2016 16:44   Mr Jeri Cos [id number] Contrast  Result Date: 11/20/2016 CLINICAL DATA:  LEFT-sided weakness for 1 day, assess for stroke. EXAM: MRI HEAD WITHOUT AND WITH CONTRAST TECHNIQUE: Multiplanar, multiecho pulse sequences of the brain and surrounding structures were obtained without and with intravenous contrast. CONTRAST:  17mL MULTIHANCE GADOBENATE DIMEGLUMINE 529 MG/ML IV SOLN COMPARISON:  CT HEAD November 20, 2016 at 1752 hours FINDINGS: INTRACRANIAL CONTENTS: Ring-enhancing RIGHT frontal lobe convexity intraparenchymal 2.2 x 1.6 x 1.9 cm mass extending to the dura, central subcentimeter susceptibility artifact which could reflect mineralization or hemosiderin. Corresponding reduced diffusion and low ADC values. At least 4 additional subcentimeter satellite nodules anterior to the dominant lesion in the RIGHT frontal lobe juxta cortical white matter. Extensive surrounding T2 bright vasogenic edema and regional sulcal effacement. No midline shift. Ventricles and sulci are normal for patient's age. Patchy supratentorial white matter FLAIR T2 hyperintensities exclusive of the aforementioned abnormality compatible with mild to moderate chronic small vessel ischemic disease. No abnormal extra-axial enhancement. VASCULAR: Normal major  intracranial vascular flow voids present at skull base. SKULL AND UPPER CERVICAL SPINE: No abnormal sellar expansion. No suspicious calvarial bone marrow signal. Craniocervical junction maintained. SINUSES/ORBITS: The mastoid air-cells and included paranasal sinuses are well-aerated.The included ocular globes and orbital contents are non-suspicious. OTHER: None. IMPRESSION: Dominant 2.2 x 1.6 x 1.9 cm RIGHT frontal lobe mass with subcentimeter satellite nodules within extensive vasogenic edema. 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 No infarct.  Mild chronic small vessel ischemic disease. Acute findings discussed with and reconfirmed by Dr.ROBERT LOCKWOOD on 11/20/2016 at 8:25 pm. Electronically Signed   By: Elon Alas M.D.   On: 11/20/2016 20:28      IMPRESSION/PLAN: 1. 76 y.o. woman post partial resection of a multifocal 2.2 cm glioblastoma of the right frontal lobe.  Today, I talked to the  patient and family about the findings and work-up thus far.  We discussed the natural history of glioblastoma and general treatment, highlighting the role of radiotherapy in the management.  We discussed the available radiation techniques, and focused on the details of logistics and delivery.  We reviewed the anticipated acute and late sequelae associated with radiation in this setting.  The patient was encouraged to ask questions that I answered to the best of my ability. The patient would like to proceed with radiation. Patient is scheduled to have transverse staples at crown from craniotomy removed on 12/11/16. CT simulation and treatment planning will follow this appointment at 2 pm. Anticipate treatment to begin on 12/22/16.   I spent 60 minutes minutes face to face with the patient and more than 50% of that time was spent in counseling and/or coordination of  care.  ------------------------------------------------   Tyler Pita, MD [place] Director and Director of Stereotactic Radiosurgery Direct Dial: [phone]  Fax: [phone] [email or web address]  Skype  [email or web address]  This document serves as a record of services personally performed by Tyler Pita, MD. It was created on his behalf by Bethann Humble, a trained medical scribe. The creation of this record is based on the scribe's personal observations and the provider's statements to them. This document has been checked and approved by the attending provider.

## 2016-12-11 ENCOUNTER — Ambulatory Visit
Admission: RE | Admit: 2016-12-11 | Discharge: 2016-12-11 | Disposition: A | Discharge status: Home / Self Care | Payer: Medicare Other | Source: Ambulatory Visit | Attending: Radiation Oncology | Admitting: Radiation Oncology

## 2016-12-11 DIAGNOSIS — Z510 Encounter for antineoplastic radiation therapy: Secondary | ICD-10-CM | POA: Diagnosis not present

## 2016-12-11 DIAGNOSIS — C711 Malignant neoplasm of frontal lobe: Secondary | ICD-10-CM

## 2016-12-11 NOTE — Progress Notes (Signed)
  Radiation Oncology         [phone] ________________________________  Name: Donna Valentine MRN: [id number]  Date: 12/11/2016  DOB: 1941-06-07  SIMULATION AND TREATMENT PLANNING NOTE    ICD-9-CM ICD-10-CM   1. 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  Identity was confirmed.  All relevant records and images related to the planned course of therapy were reviewed.  The patient freely provided informed written consent to proceed with treatment after reviewing the details related to the planned course of therapy. The consent form was witnessed and verified by the simulation staff.  Then, the patient was set-up in a stable reproducible  supine position for radiation therapy.  CT images were obtained.  Surface markings were placed.  The CT images were loaded into the planning software.  Then the planning CT was fused with the MRI by our physics staff.  The target and avoidance structures were contoured.  Treatment planning then occurred.  The radiation prescription was entered and confirmed.  I designed and supervised the construction of a total of one medically necessary complex treatment device consisting of thermoplastic mask used for immobilization.  IMAGE GUIDANCE:  The patient will undergo megavoltage CT imaging prior to each fraction with precision couch adjustments for image guided radiotherapy. The initial tumor volume plus edema plus a 1 cm margin will be treated using helical intensity modulated radiotherapy with 6 megavolt x-rays.  For the boost, the initial tumor volume plus a 1 cm margin was treated using helical intensity modulated radiotherapy with 6 megavolt x-rays  PLAN TYPE:  I have requested : Intensity Modulated Radiotherapy (IMRT) is medically  necessary for this case for the following reason:  Critical CNS structure avoidance - brainstem, optic chiasm, optic nerve.Marland Kitchen  SPECIAL TREATMENT PROCEDURE:  The planned course of therapy using radiation constitutes a special treatment procedure. Special care is required in the management of this patient for the following reasons. This treatment constitutes a Special Treatment Procedure for the following reason: [ Concurrent chemotherapy requiring careful monitoring for increased toxicities of treatment including weekly laboratory values..  The special nature of the planned course of radiotherapy will require increased physician supervision and oversight to ensure patient's safety with optimal treatment outcomes.  PLAN:  The glioblastoma site will be treated to a total dose of 60 Gy in 30 fractions using the following plan:  1.  The initial tumor volume plus edema plus a 1 cm margin was treated to 44 Gy in 22 fractions of 2 Gy  2.  The initial tumor volume plus a 1 cm margin was boosted to 60 Gy with 8 additional fractions of 2 Gy  ________________________________  Sheral Apley Tammi Klippel, M.D.

## 2016-12-12 ENCOUNTER — Ambulatory Visit: Payer: Medicare Other | Admitting: Family

## 2016-12-12 ENCOUNTER — Other Ambulatory Visit (HOSPITAL_COMMUNITY): Payer: Self-pay | Admitting: Neurosurgery

## 2016-12-12 DIAGNOSIS — C719 Malignant neoplasm of brain, unspecified: Secondary | ICD-10-CM

## 2016-12-15 ENCOUNTER — Ambulatory Visit (HOSPITAL_BASED_OUTPATIENT_CLINIC_OR_DEPARTMENT_OTHER): Payer: Medicare Other | Admitting: Hematology

## 2016-12-15 ENCOUNTER — Encounter: Payer: Self-pay | Admitting: Hematology

## 2016-12-15 ENCOUNTER — Telehealth: Payer: Self-pay | Admitting: Hematology

## 2016-12-15 ENCOUNTER — Other Ambulatory Visit (HOSPITAL_BASED_OUTPATIENT_CLINIC_OR_DEPARTMENT_OTHER): Payer: Medicare Other

## 2016-12-15 VITALS — BP 140/74 | HR 60 | Temp 99.0°F | Resp 16 | Wt 176.9 lb

## 2016-12-15 DIAGNOSIS — C719 Malignant neoplasm of brain, unspecified: Secondary | ICD-10-CM

## 2016-12-15 DIAGNOSIS — C711 Malignant neoplasm of frontal lobe: Secondary | ICD-10-CM

## 2016-12-15 LAB — COMPREHENSIVE METABOLIC PANEL
ALT: 24 U/L (ref 0–55)
ANION GAP: 8 meq/L (ref 3–11)
AST: 14 U/L (ref 5–34)
Albumin: 3.1 g/dL — ABNORMAL LOW (ref 3.5–5.0)
Alkaline Phosphatase: 92 U/L (ref 40–150)
BILIRUBIN TOTAL: 0.36 mg/dL (ref 0.20–1.20)
BUN: 19.6 mg/dL (ref 7.0–26.0)
CALCIUM: 8.9 mg/dL (ref 8.4–10.4)
CHLORIDE: 106 meq/L (ref 98–109)
CO2: 26 meq/L (ref 22–29)
CREATININE: 0.9 mg/dL (ref 0.6–1.1)
EGFR: 66 mL/min/{1.73_m2} — AB (ref >90)
Glucose: 208 mg/dl — ABNORMAL HIGH (ref 70–140)
Potassium: 4.3 mEq/L (ref 3.5–5.1)
Sodium: 140 mEq/L (ref 136–145)
TOTAL PROTEIN: 6.5 g/dL (ref 6.4–8.3)

## 2016-12-15 LAB — CBC & DIFF AND RETIC
BASO%: 0 % (ref 0.0–2.0)
Basophils Absolute: 0 10*3/uL (ref 0.0–0.1)
EOS%: 0 % (ref 0.0–7.0)
Eosinophils Absolute: 0 10*3/uL (ref 0.0–0.5)
HEMATOCRIT: 43.1 % (ref 34.8–46.6)
HGB: 14.3 g/dL (ref 11.6–15.9)
Immature Retic Fract: 3 % (ref 1.60–10.00)
LYMPH%: 13.5 % — AB (ref 14.0–49.7)
MCH: 29 pg (ref 25.1–34.0)
MCHC: 33.2 g/dL (ref 31.5–36.0)
MCV: 87.4 fL (ref 79.5–101.0)
MONO#: 0.2 10*3/uL (ref 0.1–0.9)
MONO%: 2.6 % (ref 0.0–14.0)
NEUT%: 83.9 % — ABNORMAL HIGH (ref 38.4–76.8)
NEUTROS ABS: 5.9 10*3/uL (ref 1.5–6.5)
PLATELETS: 123 10*3/uL — AB (ref 145–400)
RBC: 4.93 10*6/uL (ref 3.70–5.45)
RDW: 14.5 % (ref 11.2–14.5)
Retic %: 1.35 % (ref 0.70–2.10)
Retic Ct Abs: 66.56 10*3/uL (ref 33.70–90.70)
WBC: 7 10*3/uL (ref 3.9–10.3)
lymph#: 1 10*3/uL (ref 0.9–3.3)
nRBC: 0 % (ref 0–0)

## 2016-12-15 MED ORDER — ONDANSETRON HCL 8 MG PO TABS: 8.0000 mg | ORAL_TABLET | Freq: Three times a day (TID) | ORAL | 3 refills | Status: AC | PRN

## 2016-12-15 MED ORDER — SULFAMETHOXAZOLE-TRIMETHOPRIM 800-160 MG PO TABS: 1.0000 | ORAL_TABLET | ORAL | 1 refills | Status: DC

## 2016-12-15 NOTE — Telephone Encounter (Signed)
Gave patient avs report and appointments for March. Per [facility] patient to have only lab/fu with him 3/15 due to [facility] out 3/16 and the week of 3/19. Lab for 3/12 cxd.

## 2016-12-16 ENCOUNTER — Encounter (HOSPITAL_COMMUNITY): Payer: Self-pay

## 2016-12-16 ENCOUNTER — Other Ambulatory Visit: Payer: Medicare Other

## 2016-12-16 ENCOUNTER — Ambulatory Visit (HOSPITAL_COMMUNITY)
Admission: RE | Admit: 2016-12-16 | Discharge: 2016-12-16 | Disposition: A | Discharge status: Home / Self Care | Payer: Medicare Other | Source: Ambulatory Visit | Attending: Neurosurgery | Admitting: Neurosurgery

## 2016-12-16 DIAGNOSIS — Z9889 Other specified postprocedural states: Secondary | ICD-10-CM | POA: Diagnosis not present

## 2016-12-16 DIAGNOSIS — C719 Malignant neoplasm of brain, unspecified: Secondary | ICD-10-CM | POA: Diagnosis present

## 2016-12-16 MED ORDER — GADOBENATE DIMEGLUMINE 529 MG/ML IV SOLN
15.0000 mL | Freq: Once | INTRAVENOUS | Status: AC | PRN
  Administered 2016-12-16: 15 mL via INTRAVENOUS

## 2016-12-17 ENCOUNTER — Telehealth: Payer: Self-pay | Admitting: Hematology

## 2016-12-17 NOTE — Progress Notes (Signed)
HEMATOLOGY/ONCOLOGY CLINIC NOTE  Date of Service: .12/15/2016  Patient Care Team: Sharilyn Sites, MD as PCP - General (Family Medicine) Ledon Snare MD (Radiation Oncology) Ashok Pall MD (Neurosurgery)  CHIEF COMPLAINTS/PURPOSE OF CONSULTATION:  Newly diagnosed GBM  HISTORY OF PRESENTING ILLNESS:   Donna Valentine is a wonderful 76 y.o. female who has been referred to [place] by Dr Ashok Pall, MD  for evaluation and management of newly diagnosed Glioblastoma Multiforme.  Patient has a h/o HLD, PAC's and borderline HTN presented to the ED on 11/20/2016 with sudden onset of left arm and some leg weakness and on CT head R frontoparietal lesion of 2.2 cm w/ vasogenic edema which was further evaluated with MRI. 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 No findings concerning for bleed or infarct.  Patient was treated with high dose steroids and keppra for SZ prophylaxis. Patient subsequently had an uncomplicated right frontal stereotactic craniotomy for tumor resection of the dominant rt frontoparietal mass but not all the satellite lesions (2 were apparently clinically evident as per surgery notes). She has had improvement in her left sided strength since her surgery and he cranial wound has been dry and clean. She is ambulating with walker and eating well. She has been setup for home Physical therapy.  I met with the patient as she was readied to be discharged home. She was seen with several family members at bedside. We discussed the diagnosis , prognosis and standard of care treatment options available and option to go to [facility] for consideration of possible clinical trials. NCCN guidelines were provided and discussed.  Patient has not been evaluated by radiation oncology yet.  Patient notes that she did not have any significant health limitation  prior to the diagnosis of her GBM.  INTERVAL HISTORY  Patient is here with several of her family members for post hospital followup for her newly diagnosed GBM. Her scalp surgical wound has healed well and her staples have been removed. Her daughter is in contact with [facility] . She notes that they have not given her an appointment and would like to wait for her planned post-op MRI brain scheduled on 12/18/2016 to determine their further recommendations. She has received her temozolamide. Patient notes she has been scheduled to start RT with Dr Tammi Klippel from this coming Monday barring any changes. Is currently down to dexamethasone '4mg'$  po daily. No SZ activity LUE and LLE strenght much improved. Eating well.   MEDICAL HISTORY:      Past Medical History:  Diagnosis Date  . Cancer [facility])     SURGICAL HISTORY:      Past Surgical History:  Procedure Laterality Date  . APPENDECTOMY  1994  . APPLICATION OF CRANIAL NAVIGATION N/A 11/28/2016   Procedure: APPLICATION OF CRANIAL NAVIGATION;  Surgeon: Ashok Pall, MD;  Location: [place];  Service: Neurosurgery;  Laterality: N/A;  . CESAREAN SECTION     x3  . CRANIOTOMY Right 11/28/2016   Procedure: STERIOTACTIC PARIETAL CRANIOTOMY FOR RIGHT FRONTAL TUMOR RESECTION WITH BRAINLAB;  Surgeon: Ashok Pall, MD;  Location: [place];  Service: Neurosurgery;  Laterality: Right;    SOCIAL HISTORY: Social History        Social History  . Marital status: Married    Spouse name: N/A  . Number of children: N/A  . Years of education: N/A      Occupational History  . Not  on file.        Social History Main Topics  . Smoking status: Former Smoker    Quit date: 11/26/1991  . Smokeless tobacco: Never Used  . Alcohol use No  . Drug use: No  . Sexual activity: Not on file       Other Topics Concern  . Not on file      Social History Narrative  . No narrative on file    FAMILY HISTORY: History reviewed. No pertinent family  history.  ALLERGIES:  is allergic to penicillins and food.  MEDICATIONS: . Current Outpatient Prescriptions on File Prior to Visit  Medication Sig Dispense Refill  . dexamethasone (DECADRON) 4 MG tablet Take 1 tablet (4 mg total) by mouth every 6 (six) hours. Every 6 hours on Sat; every 8 hours on Sun/Mon, every 12 hours on Tues/Wed 16 tablet 0  . levETIRAcetam (KEPPRA) 500 MG tablet Take 1 tablet (500 mg total) by mouth 2 (two) times daily. 60 tablet 1  . ibuprofen (ADVIL,MOTRIN) 200 MG tablet Take 400 mg by mouth every 8 (eight) hours as needed.    . loratadine (CLARITIN) 10 MG tablet Take 10 mg by mouth daily as needed for allergies.    Marland Kitchen temozolomide (TEMODAR) 140 MG capsule Take 1 capsule (140 mg total) by mouth daily. May take on an empty stomach or at bedtime to decrease nausea & vomiting. (Patient not taking: Reported on 12/08/2016) 42 capsule 0  . traMADol (ULTRAM) 50 MG tablet Take 1 tablet (50 mg total) by mouth every 6 (six) hours as needed. (Patient not taking: Reported on 12/08/2016) 30 tablet 0   No current facility-administered medications on file prior to visit.       REVIEW OF SYSTEMS:    10 Point review of Systems was done is negative except as noted above.  PHYSICAL EXAMINATION: ECOG PERFORMANCE STATUS: 1 - Symptomatic but completely ambulatory .BP 140/74 (BP Location: Right Arm, Patient Position: Sitting)   Pulse 60   Temp 99 F (37.2 C) (Oral)   Resp 16   Wt 176 lb 14.4 oz (80.2 kg)   SpO2 98%   BMI 28.55 kg/m   GENERAL:alert, in no acute distress and comfortable SKIN: skin color, texture, turgor are normal, no rashes or significant lesions EYES: normal, conjunctiva are pink and non-injected, sclera clear OROPHARYNX:no exudate, no erythema and lips, buccal mucosa, and tongue normal  NECK: supple, no JVD, thyroid normal size, non-tender, without nodularity LYMPH:  no palpable lymphadenopathy in the cervical, axillary or inguinal LUNGS: clear to  auscultation with normal respiratory effort HEART: regular rate & rhythm,  no murmurs and no lower extremity edema ABDOMEN: abdomen soft, non-tender, normoactive bowel sounds  Musculoskeletal: no cyanosis of digits and no clubbing  PSYCH: alert & oriented x 3 with fluent speech NEURO:4/5 LUE strength, 4+/5 LLE strength, 5/5 on rt side UE and LE  LABORATORY DATA:  I have reviewed the data as listed  . CBC Latest Ref Rng & Units 12/15/2016 11/29/2016 11/20/2016  WBC 3.9 - 10.3 10e3/uL 7.0 13.9(H) -  Hemoglobin 11.6 - 15.9 g/dL 14.3 14.5 15.6(H)  Hematocrit 34.8 - 46.6 % 43.1 44.1 46.0  Platelets 145 - 400 10e3/uL 123(L) 194 -   . CMP Latest Ref Rng & Units 12/15/2016 11/29/2016 11/20/2016  Glucose 70 - 140 mg/dl 208(H) - 111(H)  BUN 7.0 - 26.0 mg/dL 19.6 - 11  Creatinine 0.6 - 1.1 mg/dL 0.9 0.75 0.80  Sodium 136 - 145 mEq/L 140 -  143  Potassium 3.5 - 5.1 mEq/L 4.3 - 3.6  Chloride 101 - 111 mmol/L - - 104  CO2 22 - 29 mEq/L 26 - -  Calcium 8.4 - 10.4 mg/dL 8.9 - -  Total Protein 6.4 - 8.3 g/dL 6.5 - -  Total Bilirubin 0.20 - 1.20 mg/dL 0.36 - -  Alkaline Phos 40 - 150 U/L 92 - -  AST 5 - 34 U/L 14 - -  ALT 0 - 55 U/L 24 - -       RADIOGRAPHIC STUDIES: I have personally reviewed the radiological images as listed and agreed with the findings in the report.  ImagingResults  Dg Chest 2 View  Result Date: 11/19/2016 CLINICAL DATA:  Productive cough for the past 2 weeks. Former smoker. EXAM: CHEST  2 VIEW COMPARISON:  Chest x-ray of July 10, 2010 FINDINGS: The lungs are well-expanded. There is no focal infiltrate. There is a pectus excavatum type chest contour. The heart and pulmonary vascularity are normal. The mediastinum is normal in width. There is no pleural effusion. There is moderate dextrocurvature centered in the lower thoracic spine which is stable. IMPRESSION: Mild chronic bronchitic changes, stable. No pneumonia, pulmonary edema, nor other acute cardiopulmonary  abnormality. Electronically Signed   By: David  Martinique M.D.   On: 11/19/2016 14:26   Ct Head Wo Contrast  Result Date: 11/20/2016 CLINICAL DATA:  Left arm weakness. EXAM: CT HEAD WITHOUT CONTRAST TECHNIQUE: Contiguous axial images were obtained from the base of the skull through the vertex without intravenous contrast. COMPARISON:  None. FINDINGS: Brain: Large amount of edema is identified in the high right frontal parietal region. Appearance is more suggestive of vasogenic edema than cytotoxic edema on this study. Features may be related to a potential mass lesion identified in the high right parietal region measuring 2.2 cm (image 25 series 201). Despite the edema, no subfalcine midline shift is evident. No evidence for hydrocephalus. Vascular: No hyperdense vessel or unexpected calcification. Skull: Normal. Negative for fracture or focal lesion. Sinuses/Orbits: No acute finding. Other: None. IMPRESSION: 1. Relatively large volume of apparent vasogenic edema in the high right frontoparietal region, likely secondary to a 2.2 cm high frontal parietal lobe lesion at the gray-white junction. MRI without and with contrast would be the study of choice to further evaluate. 2. No substantial mass-effect or midline shift. I discussed these findings by telephone with the triage nurse Valarie Merino) at [place] hours on 05/24/2017. Electronically Signed   By: Misty Stanley M.D.   On: 11/20/2016 18:12   Ct Head W Contrast  Result Date: 11/21/2016 CLINICAL DATA:  76 year old female. Study for stereotactic surgical planning. 2.2 cm enhancing right superior frontal lobe mass and surrounding edema vs. nonenhancing tumor. Initial encounter. EXAM: CT HEAD WITH CONTRAST TECHNIQUE: Contiguous axial images were obtained from the base of the skull through the vertex with intravenous contrast. CONTRAST:  75 mL Isovue-300 COMPARISON:  Brain MRI without and with contrast and noncontrast head CT 11/20/2016 FINDINGS: Brain: The roughly 2  cm round enhancing mass at the right superior frontal gyrus is seen on series 3, image 138. The more faint nodular enhancement anterior to the lesion is not evident by CT. The surrounding white matter hypodensity which on MRI most resembled vasogenic edema is stable. Mild regional mass effect with no midline shift. No ventriculomegaly. No loss basilar cisterns. Elsewhere gray-white matter differentiation is within normal limits. No acute intracranial hemorrhage identified. No other abnormal intracranial enhancement. Vascular: Major intracranial vascular structures are enhancing.  Skull: No osseous abnormality identified. Sinuses/Orbits: Visualized paranasal sinuses and mastoids are stable and well pneumatized. Other: Negative orbit and scalp soft tissues. IMPRESSION: 1. Study for stereotactic surgical planning. 2. Enhancing mass in the right superior frontal gyrus is on series 3, image 138. Surrounding white matter hypodensity most suggestive of vasogenic edema is stable with mild regional mass effect but no midline shift. 3. No new intracranial abnormality. Electronically Signed   By: Genevie Ann M.D.   On: 11/21/2016 13:52   Ct Head W Wo Contrast  Result Date: 11/29/2016 CLINICAL DATA:  76 year old female status post resection of right superior frontal lobe tumor, postop day 1 Initial encounter. EXAM: CT HEAD WITHOUT AND WITH CONTRAST TECHNIQUE: Contiguous axial images were obtained from the base of the skull through the vertex without and with intravenous contrast CONTRAST:  64m ISOVUE-300 IOPAMIDOL (ISOVUE-300) INJECTION 61% COMPARISON:  Preoperative stereotactic CT 11/21/2016 and earlier. FINDINGS: Brain: Small volume postoperative pneumocephalus. Small volume of gas and blood products in the right superior frontal gyrus resection cavity (series 2, image 23). Following contrast, no mass like enhancement is evident. Surrounding white matter hypodensity with mild mass effect tracking into the more inferior  right frontal lobe is stable. There is trace leftward midline shift and mild mass effect on the right lateral ventricle now. No ventriculomegaly. No other intracranial hemorrhage identified. Basilar cisterns remain patent. No other abnormal intracranial enhancement. Vascular: Major intracranial vascular structures appear to be normally enhancing. Skull: Sequelae of right frontoparietal craniotomy. No other acute osseous abnormality. Sinuses/Orbits: Visualized paranasal sinuses and mastoids are stable and well pneumatized. Other: Mild right scalp postoperative changes from craniotomy. Skin staples in place. No other acute orbit or scalp soft tissue finding. IMPRESSION: 1. Status post right superior frontal gyrus tumor resection with no adverse features. Stable right frontal lobe edema or nonenhancing tumor. No residual masslike enhancement is evident by post-contrast CT. 2. Mild postoperative intracranial mass effect with trace leftward midline shift. Electronically Signed   By: HGenevie AnnM.D.   On: 11/29/2016 16:44   Mr BJeri CosWZJContrast  Result Date: 11/20/2016 CLINICAL DATA:  LEFT-sided weakness for 1 day, assess for stroke. EXAM: MRI HEAD WITHOUT AND WITH CONTRAST TECHNIQUE: Multiplanar, multiecho pulse sequences of the brain and surrounding structures were obtained without and with intravenous contrast. CONTRAST:  181mMULTIHANCE GADOBENATE DIMEGLUMINE 529 MG/ML IV SOLN COMPARISON:  CT HEAD November 20, 2016 at 1752 hours FINDINGS: INTRACRANIAL CONTENTS: Ring-enhancing RIGHT frontal lobe convexity intraparenchymal 2.2 x 1.6 x 1.9 cm mass extending to the dura, central subcentimeter susceptibility artifact which could reflect mineralization or hemosiderin. Corresponding reduced diffusion and low ADC values. At least 4 additional subcentimeter satellite nodules anterior to the dominant lesion in the RIGHT frontal lobe juxta cortical white matter. Extensive surrounding T2 bright vasogenic edema and regional  sulcal effacement. No midline shift. Ventricles and sulci are normal for patient's age. Patchy supratentorial white matter FLAIR T2 hyperintensities exclusive of the aforementioned abnormality compatible with mild to moderate chronic small vessel ischemic disease. No abnormal extra-axial enhancement. VASCULAR: Normal major intracranial vascular flow voids present at skull base. SKULL AND UPPER CERVICAL SPINE: No abnormal sellar expansion. No suspicious calvarial bone marrow signal. Craniocervical junction maintained. SINUSES/ORBITS: The mastoid air-cells and included paranasal sinuses are well-aerated.The included ocular globes and orbital contents are non-suspicious. OTHER: None. IMPRESSION: Dominant 2.2 x 1.6 x 1.9 cm RIGHT frontal lobe mass with subcentimeter satellite nodules within extensive vasogenic edema. Constellation of findings highly concerning for multifocal glioblastoma  multiform, less likely lymphoma or metastatic disease. No infarct.  Mild chronic small vessel ischemic disease. Acute findings discussed with and reconfirmed by Dr.ROBERT LOCKWOOD on 11/20/2016 at 8:25 pm. Electronically Signed   By: Elon Alas M.D.   On: 11/20/2016 20:28     ASSESSMENT & PLAN:   76 yo previously healthy caucasian female with no significant Chronic medical co-mrobidities with   1) Newly diagnosed Rt fronto-parietal GBM with a few satellite lesions. 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 (Neurosurgery - Dr Christella Noa). Plan -patient scheduled for MRI brain on 12/18/2016 to determine post resection residual disease -she has received her temodar -prescriptions have been sent to her pharmacy for zofran prn for nausea and Bactrim three times weekly for PJP px. -she is awaiting final plan from [place] after MRI brain to determine if she is inclined to pursue clinical trials there or if she would like to  proceed with standard of care treatment were at [facility] health. She and her family shall be informing us and radiation oncology prior to the end of the week. -given patients good PS and limited co-morbidities even though she is >2 yrs old as std of care we would offer her Concurrent temodar + RT followed by adjuvant temodar x 6 months (if neg MGMT gene promoter methylation status- as is her case ) - getting therapies at home. -Barring a decision by patient to change the plan based on recommendation from [facility] we intend to start concurrent chemotherapy + RT from monday. -continue Dexamethasone '4mg'$  po daily - further taper as per radiation oncology -on keppra for SZ prophylaxis. -Several family members present for the clinic visit and had several questions which were answered in details.  chemo-counseling for temodar concurrent with radiation -RTC with Dr Irene Limbo in 2 weeks for a toxicity check with labs  All of the patients questions were answered with apparent satisfaction. The patient knows to call the clinic with any problems, questions or concerns.  I spent 30 minutes counseling the patient face to face. The total time spent in the appointment was 40 minutes and more than 50% was on counseling and direct patient cares and co-ordination or cares with neuro-surgery and radiation oncology and answering the family's several questions.    Sullivan Lone MD [place] AAHIVMS [place] [facility] Hematology/Oncology [facility]  (Office):       [phone] (Work cell):  [phone] (Fax):           [phone]

## 2016-12-17 NOTE — Telephone Encounter (Signed)
Faxed records to [facility] release id [id number]

## 2016-12-18 ENCOUNTER — Encounter: Payer: Self-pay | Admitting: *Deleted

## 2016-12-18 ENCOUNTER — Other Ambulatory Visit: Payer: Medicare Other

## 2016-12-18 ENCOUNTER — Ambulatory Visit (HOSPITAL_COMMUNITY): Payer: Medicare Other

## 2016-12-18 ENCOUNTER — Telehealth: Payer: Self-pay | Admitting: *Deleted

## 2016-12-18 DIAGNOSIS — Z510 Encounter for antineoplastic radiation therapy: Secondary | ICD-10-CM | POA: Diagnosis not present

## 2016-12-18 NOTE — Telephone Encounter (Signed)
On 12-18-16 fax medical records to [place], it was consult note, sim and planning note, and dos will do there part.

## 2016-12-18 NOTE — Progress Notes (Unsigned)
Patient and family attended chemo education class regarding starting Temodar with radiation on 12/22/16.  Patient has a second opinion consult at [facility] on 12/26/16 at 9 am and was advised NOT  to begin therapy until they have evaluated her.  Patient will contact [place] after her appointment at [facility].  She has received Temodar medication via mail.  Notified Dr Johny Shears nurse Aldona Bar Presnell regarding change in therapy.

## 2016-12-22 ENCOUNTER — Other Ambulatory Visit: Payer: Medicare Other

## 2016-12-22 ENCOUNTER — Ambulatory Visit: Payer: Medicare Other

## 2016-12-23 ENCOUNTER — Ambulatory Visit: Payer: Medicare Other

## 2016-12-24 ENCOUNTER — Ambulatory Visit: Payer: Medicare Other

## 2016-12-24 ENCOUNTER — Encounter (HOSPITAL_COMMUNITY): Payer: Self-pay

## 2016-12-25 ENCOUNTER — Ambulatory Visit (HOSPITAL_COMMUNITY): Payer: Medicare Other | Admitting: Physical Therapy

## 2016-12-25 ENCOUNTER — Ambulatory Visit: Payer: Medicare Other | Admitting: Hematology

## 2016-12-25 ENCOUNTER — Ambulatory Visit (HOSPITAL_COMMUNITY): Payer: Medicare Other | Attending: Neurosurgery | Admitting: Specialist

## 2016-12-25 ENCOUNTER — Ambulatory Visit: Payer: Medicare Other

## 2016-12-25 ENCOUNTER — Other Ambulatory Visit: Payer: Medicare Other

## 2016-12-25 DIAGNOSIS — M6281 Muscle weakness (generalized): Secondary | ICD-10-CM

## 2016-12-25 DIAGNOSIS — R2681 Unsteadiness on feet: Secondary | ICD-10-CM | POA: Insufficient documentation

## 2016-12-25 DIAGNOSIS — R278 Other lack of coordination: Secondary | ICD-10-CM | POA: Insufficient documentation

## 2016-12-25 NOTE — Patient Instructions (Signed)
  SIT TO STAND - NO SUPPORT  Start by scooting close to the front of the chair.  Next, lean forward at your trunk and reach forward with your arms and rise to standing without using your hands to push off from the chair or other object.   Use your arms as a counter-balance by reaching forward when in sitting and lower them as you approach standing.  Repeat 10-15 times, 2x/day    ELASTIC BAND - SEATED CLAMS  While sitting in a chair and an elastic band wrapped around your knees, move both knees to the sides to separate your legs. Keep contact of your feet on the floor the entire time.  Repeat 10-15 times, twice a day.    SINGLE LEG STANCE - SLS  Stand on one leg and maintain your balance. Do this exercise at the kitchen counter for safety.  Hold for as long as you can, then switch sides.  Repeat 3 times each leg, twice a day.     TANDEM STANCE BALANCE  Stand and balnace in tandem stance. Do this exercise at the kitchen counter for safety.   Hold this position for as long as you can.  Relax and repeat 3 times each side, twice a day.

## 2016-12-25 NOTE — Therapy (Signed)
[place], [place], [address] Phone: [phone]   Fax:  [phone]  Physical Therapy Evaluation  Patient Details  Name: Donna Valentine MRN: [id number] Date of Birth: 01-13-1941 Referring Provider: Ashok Pall   Encounter Date: 12/25/2016      PT End of Session - 12/25/16 1818    Visit Number 1   Number of Visits 9   Date for PT Re-Evaluation 01/22/17   Authorization Type UHC Medicare (G-codes and KX)   Authorization Time Period 12/25/16 to 01/25/17   Authorization - Visit Number 1   Authorization - Number of Visits 10   PT Start Time 2536   PT Stop Time 1428   PT Time Calculation (min) 40 min   Activity Tolerance Patient tolerated treatment well   Behavior During Therapy [facility] for tasks assessed/performed      Past Medical History:  Diagnosis Date  . Brain cancer ([facility])    GBM  . Cancer [facility])     Past Surgical History:  Procedure Laterality Date  . APPENDECTOMY  1994  . APPLICATION OF CRANIAL NAVIGATION N/A 11/28/2016   Procedure: APPLICATION OF CRANIAL NAVIGATION;  Surgeon: Ashok Pall, MD;  Location: [place];  Service: Neurosurgery;  Laterality: N/A;  . CESAREAN SECTION     x3  . CRANIOTOMY Right 11/28/2016   Procedure: STERIOTACTIC PARIETAL CRANIOTOMY FOR RIGHT FRONTAL TUMOR RESECTION WITH BRAINLAB;  Surgeon: Ashok Pall, MD;  Location: [place];  Service: Neurosurgery;  Laterality: Right;    There were no vitals filed for this visit.       Subjective Assessment - 12/25/16 1349    Subjective Patient reports that she went to [facility] on 11/20/16 due to concerns that she might be having a stroke; before that she thought she might have had a cold/flu. They did an MRI and found the tumor. She had a craniotomy on 11/28/16 to start addressing it; she is going to [facility] and may potentially start radiation treatments on Monday. She is doing much better and feels like she is doing well right now; her  whole L side is weak but she has improved quite a bit. She does still need help with her shower. There was one time when she actually rolled off the bed, this was before surgery however. Her biggest concern is working on the strength of her L side and being independent.    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 No/denies            [facility] PT Assessment - 12/25/16 0001      Assessment   Medical Diagnosis glioblastoma    Referring Provider Ashok Pall    Onset Date/Surgical Date 11/20/16   Next MD Visit Dr. Christella Noa after finishing radiation    Prior Therapy she did get some therapy at [facility]      Precautions   Precaution Comments brain tumor, hx craniotomy      Balance Screen   Has the patient fallen in the past 6 months No   Has the patient had a decrease in activity level because of a fear of falling?  No   Is the patient reluctant to leave their home because of a fear of falling?  No     Prior Function   Level of Independence Independent with gait;Independent with transfers   Vocation Retired  Leisure shopping, eating out, etc      Strength   Right Hip Flexion 3/5   Right Hip Extension 3/5   Right Hip ABduction 3/5   Left Hip Flexion 3+/5   Left Hip Extension 3/5   Left Hip ABduction 3/5   Right Knee Flexion 4/5   Right Knee Extension 4/5   Left Knee Flexion 4/5   Left Knee Extension 4/5   Right Ankle Dorsiflexion 5/5   Left Ankle Dorsiflexion 5/5     6 minute walk test results    Aerobic Endurance Distance Walked 617   Endurance additional comments 3MWT      Dynamic Gait Index   Level Surface Normal   Change in Gait Speed Normal   Gait with Horizontal Head Turns Normal   Gait with Vertical Head Turns Normal   Gait and Pivot Turn Normal   Step Over Obstacle Mild Impairment   Step Around Obstacles Mild Impairment   Steps Mild  Impairment   Total Score 21                           PT Education - 12/25/16 1817    Education provided Yes   Education Details prognosis, POC, HEP; educated that when chemo and radiation treatments start she may have a functional decline/may need to be put on hold for PT and will eventually need re-evaluation pending her tolerance to the treatments    Person(s) Educated Patient;Child(ren)   Methods Explanation;Demonstration;Handout   Comprehension Verbalized understanding;Returned demonstration;Need further instruction          PT Short Term Goals - 12/25/16 1821      PT SHORT TERM GOAL #1   Title Patient to be able to identify 5/5 safety factors/concepts in order to demonstrate good safety and reduced fall risk    Time 2   Period Weeks   Status New     PT SHORT TERM GOAL #2   Title Patient to be able to ambulate over 761ft in 3MWT in order to demonstrate improved general mobility and community access    Time 2   Period Weeks   Status New     PT SHORT TERM GOAL #3   Title Patient to be independent in correctly and consistently performing targeted HEP, to be updated as appropriate    Time 1   Period Weeks   Status New           PT Long Term Goals - 12/25/16 1822      PT LONG TERM GOAL #1   Title Patient to demonstrate functional strength 5/5 in all tested muscle groups in order to improve overall gait and balance    Time 4   Period Weeks   Status New     PT LONG TERM GOAL #2   Title Patient to score 24 on DGI in order to show improved dynamic balance skills and overall reduced fall risk    Time 4   Period Weeks   Status New     PT LONG TERM GOAL #3   Title Patient to be participatory in regular aerobic exercise program, at least 20 minutes in duration and 4 days per week, in order to maintain functional gains and imrpove overall health status    Time 4   Period Weeks   Status New               Plan - 12/25/16 1819    Clinical  Impression Statement Patient arrives approximately 1 month after having had craniotomy secondary to a R frontal lobe brain tumor; she reports that she was severely limited at first but has improved quite a bit since being discharged from the hospital. Her biggest concern at this time is working on her strength and enhancing her independence. Note that she is going to be starting a course of radiation and chemo soon, she has an appointment at [facility] tomorrow to find out more details. Examination reveals significant functional strength limitation especially in proximal musculature, mild dynamic balance impairment as indicated by DGI testing, and mild gait impairments at this time. She will benefit from skilled PT services in order to address these functional impairments however note that at this time PT POC may need to be put on hold depending on her general response to the course of chemo/radiation, will adjust POC PRN moving forward.    Rehab Potential Good   Clinical Impairments Affecting Rehab Potential (+) high PLOF, very active at baseline, good recovery after craniotomy; (-) may be starting radiation/chemo soon/effects of these treatments on tolerance to rehab    PT Frequency 2x / week   PT Duration 4 weeks   PT Treatment/Interventions ADLs/Self Care Home Management;Biofeedback;Gait training;Stair training;Functional mobility training;Therapeutic activities;Therapeutic exercise;Balance training;Neuromuscular re-education;Patient/family education;Manual techniques;Energy conservation;Taping   PT Next Visit Plan review HEP and initial eval/goals; focus on dynamic balance (foam, dynadiscs, balance beam, DGI tasks), proximal strength, high intensity gait intervals, Nustep    PT Home Exercise Plan Eval: sit to stand no UEs, seated clams with red and green TB, SLS, tandem stance    Consulted and Agree with Plan of Care Patient      Patient will benefit from skilled therapeutic intervention in order to  improve the following deficits and impairments:  Abnormal gait, Decreased coordination, Decreased mobility, Decreased activity tolerance, Decreased strength, Decreased balance, Difficulty walking  Visit Diagnosis: Muscle weakness (generalized) - Plan: PT plan of care cert/re-cert  Unsteadiness on feet - Plan: PT plan of care cert/re-cert      G-Codes - 10/14/70 1823    Functional Assessment Tool Used (Outpatient Only) Based on skilled clincial assessment of strenght, gait, dynamic balance, overall fall risk    Functional Limitation Mobility: Walking and moving around   Mobility: Walking and Moving Around Current Status (Z3664) At least 1 percent but less than 20 percent impaired, limited or restricted   Mobility: Walking and Moving Around Goal Status (Q0347) 0 percent impaired, limited or restricted       Problem List Patient Active Problem List   Diagnosis Date Noted  . Goals of care, counseling/discussion   . Glioblastoma multiforme of frontal lobe ([place]) 12/01/2016  . Brain tumor ([place]) 11/28/2016  . 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 Left-sided weakness 11/20/2016  . 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 HYPERCHOLESTEROLEMIA, BORDERLINE 07/28/2008  . ANEMIA, MILD 07/28/2008  . DIVERTICULOSIS OF COLON 07/28/2008  . HEMATURIA, HX OF 07/28/2008  . COLONIC POLYPS 07/27/2008  . RIGHT BUNDLE BRANCH BLOCK 07/27/2008  . PREMATURE ATRIAL CONTRACTIONS 07/27/2008  . HEMORRHOIDS 07/27/2008  . VENOUS INSUFFICIENCY 07/27/2008  . BACK PAIN, LUMBAR 07/27/2008  . SCOLIOSIS 07/27/2008  . PALPITATIONS, HX OF 07/27/2008    Deniece Ree PT, DPT [place] [address] [place], [place], [address] Phone: [phone]   Fax:  [phone]  Name: Donna Valentine MRN: [id number] Date of Birth: 12/26/1940

## 2016-12-25 NOTE — Patient Instructions (Signed)
  Scapular Retraction (Standing)   With arms at sides, pinch shoulder blades together. Repeat ____ times per set. Do ____ sets per session. Do ____ sessions per day.  http://orth.exer.us/944   Copyright  VHI. All rights reserved.    Posterior Capsule Stretch   Stand or sit, one arm across body so hand rests over opposite shoulder. Gently push on crossed elbow with other hand until stretch is felt in shoulder of crossed arm. Hold ___ seconds.  Repeat ___ times per session. Do ___ sessions per day.   Wall Flexion  Using a towel, slide your arm up the wall until a stretch is felt in your shoulder .   Home Exercises Program Theraputty Exercises  Do the following exercises 2 times a day using your affected hand.  1. Roll putty into a ball.  2. Make into a pancake.  3. Roll putty into a roll.  4. Pinch along log with first finger and thumb.   5. Make into a ball.  6. Roll it back into a log.   7. Pinch using thumb and side of first finger.  8. Roll into a ball, then flatten into a pancake.  9. Using your fingers, make putty into a mountain.  Fine Motor Coordination Exercises  Perform the following exercises  2 times a day, as recommended by your occupational therapist.   Close all fingers and thumb into a tight fist and then open wide. (10 times)  Palm of hand on table, spread fingers apart, then together. (10 times)  Lift fingers and thumb off table one at a time. Increase speed as able. (10 times)  Touch thumb to each fingertip. Increase speed as able. (10 times)  Pick up 5 small objects (coins, marbles, paperclips, beads, etc.) one at a time and hold them in hand, then place them one by one onto the table.  Pick up small objects and place them into a cup or container.  Thread buttons or beads onto a string.  Place clothespins onto the edge of a cup, can, or container.  Play card games. Practice shuffling and dealing cards. Flip cards over onto table one  by one.  Practice screwing and unscrewing nuts/bolts.  Stack approximately [employer] (checkers, coins, etc.) onto table.  Use scissors to cut paper.  Practice writing skills, dot to dot, puzzles, etc.  With tweezers, pick up small objects and put into a small container. Try sorting beads or buttons.

## 2016-12-25 NOTE — Therapy (Signed)
[place], [place], [address] Phone: [phone]   Fax:  [phone]  Occupational Therapy Evaluation  Patient Details  Name: Donna Valentine MRN: [id number] Date of Birth: August 29, 1941 Referring Provider: Dr. Ashok Pall  Encounter Date: 12/25/2016      OT End of Session - 12/25/16 2159    Visit Number 1   Number of Visits 12   Date for OT Re-Evaluation 02/05/17  mini reassess on 01/22/17   Authorization Type UHC Medicare   Authorization Time Period before 10th visit   Authorization - Visit Number 1   Authorization - Number of Visits 10   OT Start Time 1300   OT Stop Time 1345   OT Time Calculation (min) 45 min   Activity Tolerance Patient tolerated treatment well   Behavior During Therapy [facility] for tasks assessed/performed      Past Medical History:  Diagnosis Date  . Brain cancer ([place])    GBM  . Cancer [facility])     Past Surgical History:  Procedure Laterality Date  . APPENDECTOMY  1994  . APPLICATION OF CRANIAL NAVIGATION N/A 11/28/2016   Procedure: APPLICATION OF CRANIAL NAVIGATION;  Surgeon: Ashok Pall, MD;  Location: [place];  Service: Neurosurgery;  Laterality: N/A;  . CESAREAN SECTION     x3  . CRANIOTOMY Right 11/28/2016   Procedure: STERIOTACTIC PARIETAL CRANIOTOMY FOR RIGHT FRONTAL TUMOR RESECTION WITH BRAINLAB;  Surgeon: Ashok Pall, MD;  Location: [place];  Service: Neurosurgery;  Laterality: Right;    There were no vitals filed for this visit.      Subjective Assessment - 12/25/16 2155    Subjective  S:  I think my arm is getting better every day.  I do seem to have some shaking, its worse when I am tired or hungry.   Patient is accompained by: Family member   Pertinent History Patient has a h/o HLD, PAC's and borderline HTN presented to the ED on 11/20/2016 with sudden onset of left arm and some leg weakness and on CT head R frontoparietal lesion of 2.2 cm w/ vasogenic edema which was  further evaluated with MRI. 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 No findings concerning for bleed or infarct.  Tumor was removed on 11/28/16. Patient was discharged home with home health on 12/02/16 and has now been referred to occupational therapy for evalution and treatment.   Special Tests clinical judgement,    Patient Stated Goals I want to get back where I was.     Currently in Pain? No/denies           [facility] OT Assessment - 12/25/16 2215      Assessment   Diagnosis LUE Weakness S/P removal of right frontal lobe glioblastoma multiforme   Referring Provider Dr. Ashok Pall   Onset Date 11/28/16     Precautions   Precautions Other (comment)   Precaution Comments brain tumor, hx craniotomy      Restrictions   Weight Bearing Restrictions No     Balance Screen   Has the patient fallen in the past 6 months Yes   How many times? 1   Has the patient had a decrease in activity level because of a fear of falling?  No   Is the patient reluctant to leave their home because of a fear of falling?  No  Home  Environment   Family/patient expects to be discharged to: Private residence   Lives With [place]   Level of Independence Independent  driving   Vocation Retired   Leisure shopping, grandkids, church activities, eating out     ADL   ADL comments Patient is currently recieving assistance with bathing, transfers into shower, decreased use of LUE with bilateral upper extremity tasks, weakness in left arm and tremors cause decreased functional use, decreased participation with IADLS, has not returned to driving.     Written Expression   Dominant Hand Right   Handwriting 75% legible     Vision - History   Baseline Vision Wears glasses all the time     Cognition   Overall Cognitive Status Within Functional Limits for tasks  assessed     Observation/Other Assessments   Focus on Therapeutic Outcomes (FOTO)  clinical judgement     Sensation   Light Touch Appears Intact     Coordination   Gross Motor Movements are Fluid and Coordinated No   Fine Motor Movements are Fluid and Coordinated No   Coordination and Movement Description non purposeful tremors that worsen with any activity that involves force   9 Hole Peg Test Right;Left   Right 9 Hole Peg Test 24.93"   Left 9 Hole Peg Test 39.48"   Tremors present and worsen with forceful activities     Tone   Assessment Location Left Upper Extremity     ROM / Strength   AROM / PROM / Strength AROM;Strength     AROM   Overall AROM Comments LUE A/ROM is [facility] except for shoulder   AROM Assessment Site Shoulder   Right/Left Shoulder Left   Left Shoulder Flexion 130 Degrees   Left Shoulder ABduction 125 Degrees   Left Shoulder Internal Rotation 70 Degrees   Left Shoulder External Rotation 70 Degrees     Strength   Strength Assessment Site Shoulder;Elbow;Wrist   Right/Left Shoulder Left   Left Shoulder Flexion 4/5   Left Shoulder ABduction 4/5   Left Shoulder Internal Rotation 4/5   Left Shoulder External Rotation 4/5   Right/Left Elbow Left   Left Elbow Flexion 4-/5   Left Elbow Extension 4-/5   Right/Left Wrist Left   Left Wrist Flexion 5/5   Left Wrist Extension 5/5     Hand Function   Right Hand Grip (lbs) 50   Right Hand Lateral Pinch 12 lbs   Right Hand 3 Point Pinch 15 lbs   Left Hand Grip (lbs) 35   Left Hand Lateral Pinch 9 lbs   Left 3 point pinch 9 lbs     LUE Tone   LUE Tone Brunnstrom Scale   Brunnstrom Scale (LUE) --  stage VI                         OT Education - 12/25/16 2158    Education provided Yes   Education Details Educated patient on prognosis, plan of care.  Educated patient on shoulder stretches, grip and pinch strengthening with light resist theraputty, and fine motor coordination training.     Person(s) Educated Patient;Child(ren)   Methods Explanation;Demonstration;Handout   Comprehension Verbalized understanding;Returned demonstration          OT Short Term Goals - 12/25/16 2206      OT SHORT TERM GOAL #1   Title Patient will be educated and independent with an HEP for improved functional  use of LUE with all daily tasks.    Time 6   Period Weeks   Status New     OT SHORT TERM GOAL #2   Title Patient will return to prior level of function, using her left upper extremity actively with all tasks.    Time 6   Period Weeks     OT SHORT TERM GOAL #3   Title Patient will improve left shoulder A/ROM to WNL for improved ability to reach into overhead cabinets.    Time 6   Period Weeks   Status New     OT SHORT TERM GOAL #4   Title Patient will improve left upper extremity strength to 4+/5 for improved ability to lift shopping bags.    Time 6   Period Weeks   Status New     OT SHORT TERM GOAL #5   Title Patient will improve left grip strength by 10 pounds and pinch strength by 6 pounds or more for improved ability to open containers and maintain grip on objects.     Time 6   Period Weeks   Status New     Additional Short Term Goals   Additional Short Term Goals Yes     OT SHORT TERM GOAL #6   Title Patient will improve fine motor coordination needed to fasten jewelery by decreasing completion time on nine hole peg test by 10 seconds.    Time 6   Period Weeks   Status New     OT SHORT TERM GOAL #7   Title Patient will decrease non purposeful tremors from moderate to minimal for improved abilty to complete daily tasks safely.    Time 6   Period Weeks   Status New                  Plan - 12/25/16 2200    Clinical Impression Statement A:  Patient is a 76 year old female with past medical history significant for hypertension and appendectomy who was diagnosed with a right side frontal lobe glioblastoma multiforme, which was removed on 11/28/16.   Patients deficits are causing decreased independence with B/IADLS, driving, and leisure activities.     Rehab Potential Good   Clinical Impairments Affecting Rehab Potential motivation, age, speed of recovery   OT Frequency 2x / week   OT Duration 6 weeks   OT Treatment/Interventions Self-care/ADL training;Cryotherapy;Moist Heat;Therapeutic exercise;Neuromuscular education;Energy conservation;DME and/or AE instruction;Manual Therapy;Passive range of motion;Therapeutic activities;Therapeutic exercises;Patient/family education   Plan P:  Skilled OT intervention to improve patients left upper extremity shoulder A/ROM, LUE strength, grip and pinch strength, and coordination, while decreasing nonpurposeful tremors in order to use LUE with all daily tasks at PLOF.  Next session:  review plan of care, begin proximal shoulder strengthening, grip and pinc hstrengthening, and fine motor coordination.        Patient will benefit from skilled therapeutic intervention in order to improve the following deficits and impairments:  Decreased coordination, Decreased range of motion, Decreased strength, Impaired UE functional use  Visit Diagnosis: Other lack of coordination - Plan: Ot plan of care cert/re-cert  Muscle weakness (generalized) - Plan: Ot plan of care cert/re-cert      G-Codes - 89/38/10 01-17-12    Functional Assessment Tool Used (Outpatient only) fine motor coordination right vs left percentage 63% independent, 37% impaired   Functional Limitation Carrying, moving and handling objects   Carrying, Moving and Handling Objects Current Status (F7510) At least 20 percent but less  than 40 percent impaired, limited or restricted   Carrying, Moving and Handling Objects Goal Status (O7703) At least 1 percent but less than 20 percent impaired, limited or restricted      Problem List Patient Active Problem List   Diagnosis Date Noted  . Goals of care, counseling/discussion   . Glioblastoma multiforme of  frontal lobe ([place]) 12/01/2016  . Brain tumor ([place]) 11/28/2016  . 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 Left-sided weakness 11/20/2016  . 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 HYPERCHOLESTEROLEMIA, BORDERLINE 07/28/2008  . ANEMIA, MILD 07/28/2008  . DIVERTICULOSIS OF COLON 07/28/2008  . HEMATURIA, HX OF 07/28/2008  . COLONIC POLYPS 07/27/2008  . RIGHT BUNDLE BRANCH BLOCK 07/27/2008  . PREMATURE ATRIAL CONTRACTIONS 07/27/2008  . HEMORRHOIDS 07/27/2008  . VENOUS INSUFFICIENCY 07/27/2008  . BACK PAIN, LUMBAR 07/27/2008  . SCOLIOSIS 07/27/2008  . PALPITATIONS, HX OF 07/27/2008    Vangie Bicker, [place], OTR/L [phone]  12/25/2016, 10:28 PM  [place], [place], [address] Phone: [phone]   Fax:  [phone]  Name: Donna Valentine MRN: [id number] Date of Birth: 12-13-1940

## 2016-12-26 ENCOUNTER — Ambulatory Visit: Payer: Medicare Other

## 2016-12-26 ENCOUNTER — Telehealth: Payer: Self-pay | Admitting: *Deleted

## 2016-12-26 ENCOUNTER — Other Ambulatory Visit: Payer: Self-pay | Admitting: Urology

## 2016-12-26 DIAGNOSIS — D696 Thrombocytopenia, unspecified: Secondary | ICD-10-CM

## 2016-12-26 NOTE — Telephone Encounter (Signed)
Received call from Frutoso Chase, NP at [facility].  Pt had apt today to inquire about clinical trials.  No trials available at this time.  Advised to start temodar/radiation as previously discussed with patient.  This RN left message with Joaquim Lai, RN with Dr. Tammi Klippel stating pt should be contacted to begin radiation.  Message sent to Dr. Irene Limbo to inform him of plan.

## 2016-12-29 ENCOUNTER — Ambulatory Visit
Admission: RE | Admit: 2016-12-29 | Discharge: 2016-12-29 | Disposition: A | Discharge status: Home / Self Care | Payer: Medicare Other | Source: Ambulatory Visit | Attending: Radiation Oncology | Admitting: Radiation Oncology

## 2016-12-29 ENCOUNTER — Ambulatory Visit
Admission: RE | Admit: 2016-12-29 | Payer: Medicare Other | Source: Ambulatory Visit | Attending: Radiation Oncology | Admitting: Radiation Oncology

## 2016-12-29 ENCOUNTER — Ambulatory Visit
Admission: RE | Admit: 2016-12-29 | Discharge: 2016-12-29 | Disposition: A | Discharge status: Home / Self Care | Payer: Medicare Other | Source: Ambulatory Visit | Attending: Urology | Admitting: Urology

## 2016-12-29 DIAGNOSIS — D696 Thrombocytopenia, unspecified: Secondary | ICD-10-CM | POA: Diagnosis present

## 2016-12-29 DIAGNOSIS — Z510 Encounter for antineoplastic radiation therapy: Secondary | ICD-10-CM | POA: Diagnosis not present

## 2016-12-29 LAB — CBC WITH DIFFERENTIAL/PLATELET
BASO%: 0.4 % (ref 0.0–2.0)
BASOS ABS: 0 10*3/uL (ref 0.0–0.1)
EOS ABS: 0 10*3/uL (ref 0.0–0.5)
EOS%: 0.1 % (ref 0.0–7.0)
HEMATOCRIT: 41.8 % (ref 34.8–46.6)
HEMOGLOBIN: 13.7 g/dL (ref 11.6–15.9)
LYMPH#: 1.2 10*3/uL (ref 0.9–3.3)
LYMPH%: 13.6 % — ABNORMAL LOW (ref 14.0–49.7)
MCH: 29 pg (ref 25.1–34.0)
MCHC: 32.9 g/dL (ref 31.5–36.0)
MCV: 88.1 fL (ref 79.5–101.0)
MONO#: 0.3 10*3/uL (ref 0.1–0.9)
MONO%: 3.4 % (ref 0.0–14.0)
NEUT#: 7 10*3/uL — ABNORMAL HIGH (ref 1.5–6.5)
NEUT%: 82.5 % — AB (ref 38.4–76.8)
PLATELETS: 226 10*3/uL (ref 145–400)
RBC: 4.74 10*6/uL (ref 3.70–5.45)
RDW: 15.5 % — AB (ref 11.2–14.5)
WBC: 8.5 10*3/uL (ref 3.9–10.3)

## 2016-12-30 ENCOUNTER — Ambulatory Visit
Admission: RE | Admit: 2016-12-30 | Discharge: 2016-12-30 | Disposition: A | Discharge status: Home / Self Care | Payer: Medicare Other | Source: Ambulatory Visit | Attending: Radiation Oncology | Admitting: Radiation Oncology

## 2016-12-30 DIAGNOSIS — Z510 Encounter for antineoplastic radiation therapy: Secondary | ICD-10-CM | POA: Diagnosis not present

## 2016-12-31 ENCOUNTER — Telehealth: Payer: Self-pay | Admitting: Hematology

## 2016-12-31 ENCOUNTER — Ambulatory Visit
Admission: RE | Admit: 2016-12-31 | Discharge: 2016-12-31 | Disposition: A | Discharge status: Home / Self Care | Payer: Medicare Other | Source: Ambulatory Visit | Attending: Radiation Oncology | Admitting: Radiation Oncology

## 2016-12-31 ENCOUNTER — Ambulatory Visit (HOSPITAL_COMMUNITY): Payer: Medicare Other | Admitting: Physical Therapy

## 2016-12-31 DIAGNOSIS — Z510 Encounter for antineoplastic radiation therapy: Secondary | ICD-10-CM | POA: Diagnosis not present

## 2016-12-31 NOTE — Telephone Encounter (Signed)
Called daughter Jan and she is aware of new appt time and date. Unable to schedule next available with MD per patient and MD away first week of April so next available after that was scheduled. - per sch msg from [facility]

## 2017-01-01 ENCOUNTER — Ambulatory Visit
Admission: RE | Admit: 2017-01-01 | Discharge: 2017-01-01 | Disposition: A | Discharge status: Home / Self Care | Payer: Medicare Other | Source: Ambulatory Visit | Attending: Radiation Oncology | Admitting: Radiation Oncology

## 2017-01-01 ENCOUNTER — Telehealth: Payer: Self-pay | Admitting: *Deleted

## 2017-01-01 DIAGNOSIS — Z510 Encounter for antineoplastic radiation therapy: Secondary | ICD-10-CM | POA: Diagnosis not present

## 2017-01-01 NOTE — Telephone Encounter (Signed)
-----   Message from Arty Baumgartner, RN sent at 01/01/2017 12:05 PM EDT ----- Regarding: FW: UPDATE   ----- Message ----- From: Brunetta Genera, MD Sent: 12/31/2016  10:21 PM To: Arty Baumgartner, RN Subject: RE: UPDATE                                     Start temodar with 1st day of RT. thx [facility] ----- Message ----- From: Arty Baumgartner, RN Sent: 12/26/2016  11:24 AM To: Ronnette Juniper, RN, # Subject: UPDATE                                         Dr. Irene Limbo,   I received a call from Frutoso Chase, NP at [facility] stating there were no clinical trials available for Mrs. [place] at this time. Pt was advised to move forward with Temodar and radiation as previously discussed.  [facility] will see pt back 2-3 weeks after completed course of radiation with new MRI.  I have sent a message to Dr. Johny Shears nurse to get her set up for Radiation.  Should she start her Temodar now or wait until she physically starts radiation?  Let me know.    Thanks,  [employer]

## 2017-01-01 NOTE — Telephone Encounter (Signed)
Called patient per staff message, no answer. Left voicemail to call [facility].

## 2017-01-02 ENCOUNTER — Ambulatory Visit
Admission: RE | Admit: 2017-01-02 | Discharge: 2017-01-02 | Disposition: A | Discharge status: Home / Self Care | Payer: Medicare Other | Source: Ambulatory Visit | Attending: Radiation Oncology | Admitting: Radiation Oncology

## 2017-01-02 DIAGNOSIS — Z510 Encounter for antineoplastic radiation therapy: Secondary | ICD-10-CM | POA: Diagnosis not present

## 2017-01-05 ENCOUNTER — Ambulatory Visit (HOSPITAL_COMMUNITY): Payer: Medicare Other | Admitting: Physical Therapy

## 2017-01-05 ENCOUNTER — Ambulatory Visit
Admission: RE | Admit: 2017-01-05 | Discharge: 2017-01-05 | Disposition: A | Discharge status: Home / Self Care | Payer: Medicare Other | Source: Ambulatory Visit | Attending: Radiation Oncology | Admitting: Radiation Oncology

## 2017-01-05 ENCOUNTER — Ambulatory Visit (HOSPITAL_COMMUNITY): Payer: Medicare Other | Admitting: Specialist

## 2017-01-05 DIAGNOSIS — M6281 Muscle weakness (generalized): Secondary | ICD-10-CM

## 2017-01-05 DIAGNOSIS — R278 Other lack of coordination: Secondary | ICD-10-CM

## 2017-01-05 DIAGNOSIS — R2681 Unsteadiness on feet: Secondary | ICD-10-CM

## 2017-01-05 DIAGNOSIS — Z510 Encounter for antineoplastic radiation therapy: Secondary | ICD-10-CM | POA: Diagnosis not present

## 2017-01-05 NOTE — Therapy (Signed)
[place], [place], [address] Phone: [phone]   Fax:  [phone]  Physical Therapy Treatment  Patient Details  Name: Donna Valentine MRN: [id number] Date of Birth: [birth year] Referring Provider: Ashok Pall   Encounter Date: 01/05/2017      PT End of Session - 01/05/17 1429    Visit Number 2   Number of Visits 9   Date for PT Re-Evaluation 01/22/17   Authorization Type UHC Medicare (G-codes and KX)   Authorization Time Period 12/25/16 to 01/25/17   Authorization - Visit Number 2   Authorization - Number of Visits 10   PT Start Time 1025  patient late/I ran over with previous patient    PT Stop Time 1428   PT Time Calculation (min) 30 min   Activity Tolerance Patient tolerated treatment well   Behavior During Therapy [facility] for tasks assessed/performed      Past Medical History:  Diagnosis Date  . Brain cancer ([place])    GBM  . Cancer [facility])     Past Surgical History:  Procedure Laterality Date  . APPENDECTOMY  1994  . APPLICATION OF CRANIAL NAVIGATION N/A 11/28/2016   Procedure: APPLICATION OF CRANIAL NAVIGATION;  Surgeon: Ashok Pall, MD;  Location: [place];  Service: Neurosurgery;  Laterality: N/A;  . CESAREAN SECTION     x3  . CRANIOTOMY Right 11/28/2016   Procedure: STERIOTACTIC PARIETAL CRANIOTOMY FOR RIGHT FRONTAL TUMOR RESECTION WITH BRAINLAB;  Surgeon: Ashok Pall, MD;  Location: [place];  Service: Neurosurgery;  Laterality: Right;    There were no vitals filed for this visit.      Subjective Assessment - 01/05/17 1402    Subjective Patient arrives stating she is doing well; she is going to be able to do her cancer treatments    Currently in Pain? No/denies                         [facility] Adult PT Treatment/Exercise - 01/05/17 0001      Knee/Hip Exercises: Standing   Heel Raises Both;1 set;15 reps   Heel Raises Limitations toe and heel      Knee/Hip Exercises: Seated    Long Arc Quad Both;1 set;15 reps   Long Arc Quad Weight 3 lbs.   Long [employer] Limitations 3 second holds    Marching Both;1 set;10 reps   Marching Limitations 3 second holds    Marching Weights 3 lbs.             Balance Exercises - 01/05/17 1413      Balance Exercises: Standing   Tandem Stance Eyes open;Foam/compliant surface;3 reps;25 secs   SLS Eyes open;Foam/compliant surface;3 reps;20 secs   Tandem Gait Forward;4 reps;Other (comment)  on foam in prallel bars: forwards and sideways            PT Education - 01/05/17 1429    Education provided Yes   Education Details reviewed intial eval and goals    Person(s) Educated Patient   Methods Explanation   Comprehension Verbalized understanding          PT Short Term Goals - 12/25/16 1821      PT SHORT TERM GOAL #1   Title Patient to be able to identify 5/5 safety factors/concepts in order to demonstrate good safety and reduced fall risk    Time 2   Period Weeks   Status New     PT SHORT TERM GOAL #  2   Title Patient to be able to ambulate over 7110ft in 3MWT in order to demonstrate improved general mobility and community access    Time 2   Period Weeks   Status New     PT SHORT TERM GOAL #3   Title Patient to be independent in correctly and consistently performing targeted HEP, to be updated as appropriate    Time 1   Period Weeks   Status New           PT Long Term Goals - 12/25/16 1822      PT LONG TERM GOAL #1   Title Patient to demonstrate functional strength 5/5 in all tested muscle groups in order to improve overall gait and balance    Time 4   Period Weeks   Status New     PT LONG TERM GOAL #2   Title Patient to score 24 on DGI in order to show improved dynamic balance skills and overall reduced fall risk    Time 4   Period Weeks   Status New     PT LONG TERM GOAL #3   Title Patient to be participatory in regular aerobic exercise program, at least 20 minutes in duration and 4 days  per week, in order to maintain functional gains and imrpove overall health status    Time 4   Period Weeks   Status New               Plan - 01/05/17 1429    Clinical Impression Statement Patient arrives a few minutes late for today's session; began with functional strengthening in seated and closed chain positions, then progressed to static and dynamic balance based tasks inside of parallel bars today as well as high intensity gait intervals. Continue to note ongoing balance deficits as evidenced by difficulties with balance based interventions this session.  Reviewed initial eval and goals after completing quick disclosure today.    Rehab Potential Good   Clinical Impairments Affecting Rehab Potential (+) high PLOF, very active at baseline, good recovery after craniotomy; (-) may be starting radiation/chemo soon/effects of these treatments on tolerance to rehab    PT Frequency 2x / week   PT Duration 4 weeks   PT Treatment/Interventions ADLs/Self Care Home Management;Biofeedback;Gait training;Stair training;Functional mobility training;Therapeutic activities;Therapeutic exercise;Balance training;Neuromuscular re-education;Patient/family education;Manual techniques;Energy conservation;Taping   PT Next Visit Plan increase difficulty of dynamic balance; continue with strength, high intensity gait. Introduce Nustep.    PT Home Exercise Plan Eval: sit to stand no UEs, seated clams with red and green TB, SLS, tandem stance    Consulted and Agree with Plan of Care Patient      Patient will benefit from skilled therapeutic intervention in order to improve the following deficits and impairments:  Abnormal gait, Decreased coordination, Decreased mobility, Decreased activity tolerance, Decreased strength, Decreased balance, Difficulty walking  Visit Diagnosis: Muscle weakness (generalized)  Unsteadiness on feet  Other lack of coordination     Problem List Patient Active Problem List    Diagnosis Date Noted  . Goals of care, counseling/discussion   . 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 Brain tumor ([place]) 11/28/2016  . 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 Left-sided weakness 11/20/2016  . 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 HYPERCHOLESTEROLEMIA, BORDERLINE 07/28/2008  . ANEMIA, MILD 07/28/2008  . DIVERTICULOSIS OF COLON 07/28/2008  . HEMATURIA, HX OF 07/28/2008  . COLONIC POLYPS 07/27/2008  . RIGHT BUNDLE  BRANCH BLOCK 07/27/2008  . PREMATURE ATRIAL CONTRACTIONS 07/27/2008  . HEMORRHOIDS 07/27/2008  . VENOUS INSUFFICIENCY 07/27/2008  . BACK PAIN, LUMBAR 07/27/2008  . SCOLIOSIS 07/27/2008  . PALPITATIONS, HX OF 07/27/2008    Deniece Ree PT, DPT [place] [address] [place], [place], [address] Phone: [phone]   Fax:  [phone]  Name: Mona Ayars MRN: [id number] Date of Birth: 1940/10/17

## 2017-01-06 ENCOUNTER — Ambulatory Visit: Payer: Medicare Other | Admitting: Hematology

## 2017-01-06 ENCOUNTER — Other Ambulatory Visit: Payer: Medicare Other

## 2017-01-06 ENCOUNTER — Ambulatory Visit
Admission: RE | Admit: 2017-01-06 | Discharge: 2017-01-06 | Disposition: A | Discharge status: Home / Self Care | Payer: Medicare Other | Source: Ambulatory Visit | Attending: Radiation Oncology | Admitting: Radiation Oncology

## 2017-01-06 DIAGNOSIS — Z510 Encounter for antineoplastic radiation therapy: Secondary | ICD-10-CM | POA: Diagnosis not present

## 2017-01-07 ENCOUNTER — Ambulatory Visit
Admission: RE | Admit: 2017-01-07 | Discharge: 2017-01-07 | Disposition: A | Discharge status: Home / Self Care | Payer: Medicare Other | Source: Ambulatory Visit | Attending: Radiation Oncology | Admitting: Radiation Oncology

## 2017-01-07 ENCOUNTER — Encounter (HOSPITAL_COMMUNITY): Payer: Self-pay

## 2017-01-07 ENCOUNTER — Ambulatory Visit (HOSPITAL_COMMUNITY): Payer: Medicare Other | Admitting: Physical Therapy

## 2017-01-07 ENCOUNTER — Ambulatory Visit (HOSPITAL_COMMUNITY): Payer: Medicare Other

## 2017-01-07 DIAGNOSIS — R278 Other lack of coordination: Secondary | ICD-10-CM

## 2017-01-07 DIAGNOSIS — Z510 Encounter for antineoplastic radiation therapy: Secondary | ICD-10-CM | POA: Diagnosis not present

## 2017-01-07 DIAGNOSIS — R2681 Unsteadiness on feet: Secondary | ICD-10-CM

## 2017-01-07 DIAGNOSIS — M6281 Muscle weakness (generalized): Secondary | ICD-10-CM

## 2017-01-07 NOTE — Therapy (Signed)
Donna Valentine, [place], [address] Phone: [phone]   Fax:  [phone]  Occupational Therapy Treatment  Patient Details  Name: Donna Valentine MRN: [id number] Date of Birth: 1941-04-27 Referring Provider: Dr. Ashok Pall  Encounter Date: 01/07/2017      OT End of Session - 01/07/17 1459    Visit Number 2   Number of Visits 12   Date for OT Re-Evaluation 02/05/17  mini reassess on 01/22/17   Authorization Type UHC Medicare   Authorization Time Period before 10th visit   Authorization - Visit Number 2   Authorization - Number of Visits 10   OT Start Time 9480   OT Stop Time 1430   OT Time Calculation (min) 45 min   Activity Tolerance Patient tolerated treatment well   Behavior During Therapy [facility] for tasks assessed/performed      Past Medical History:  Diagnosis Date  . Brain cancer ([place])    GBM  . Cancer [facility])     Past Surgical History:  Procedure Laterality Date  . APPENDECTOMY  1994  . APPLICATION OF CRANIAL NAVIGATION N/A 11/28/2016   Procedure: APPLICATION OF CRANIAL NAVIGATION;  Surgeon: Ashok Pall, MD;  Location: [place];  Service: Neurosurgery;  Laterality: N/A;  . CESAREAN SECTION     x3  . CRANIOTOMY Right 11/28/2016   Procedure: STERIOTACTIC PARIETAL CRANIOTOMY FOR RIGHT FRONTAL TUMOR RESECTION WITH BRAINLAB;  Surgeon: Ashok Pall, MD;  Location: [place];  Service: Neurosurgery;  Laterality: Right;    There were no vitals filed for this visit.      Subjective Assessment - 01/07/17 1425    Subjective  S: I see the doctor tomorrow. I'm going to talk to him about the tremor.    Currently in Pain? No/denies            [facility] OT Assessment - 01/07/17 1425      Assessment   Diagnosis LUE Weakness S/P removal of right frontal lobe glioblastoma multiforme     Precautions   Precautions Other (comment)   Precaution Comments brain tumor, hx craniotomy                   OT  Treatments/Exercises (OP) - 01/07/17 1426      Exercises   Exercises Hand     Hand Exercises   Theraputty Flatten;Roll;Grip;Pinch   Theraputty - Flatten red   Theraputty - Roll red   Theraputty - Grip red   Theraputty - Pinch red   Theraputty - Locate Pegs     Hand Gripper with Large Beads all beads with gripper set at 29#   Hand Gripper with Medium Beads 4 beads with gripper set at 25#. the remaining beads completed with gripper set at 20#   Sponges 11,11,15             Balance Exercises - 01/07/17 1326      Balance Exercises: Standing   Stepping Strategy Anterior;Posterior;10 reps   Rockerboard Anterior/posterior;Lateral;EO;Intermittent UE support  2 mintues each way            OT Education - 01/07/17 1456    Education provided Yes   Education Details Patient was given print out of OT evaluation  for goals and plan of care reference.     Person(s) Educated Patient   Methods Explanation;Handout   Comprehension Verbalized understanding          OT Short Term Goals - 01/07/17 1513  OT SHORT TERM GOAL #1   Title Patient will be educated and independent with an HEP for improved functional use of LUE with all daily tasks.    Time 6   Period Weeks   Status On-going     OT SHORT TERM GOAL #2   Title Patient will return to prior level of function, using her left upper extremity actively with all tasks.    Time 6   Period Weeks   Status On-going     OT SHORT TERM GOAL #3   Title Patient will improve left shoulder A/ROM to WNL for improved ability to reach into overhead cabinets.    Time 6   Period Weeks   Status On-going     OT SHORT TERM GOAL #4   Title Patient will improve left upper extremity strength to 4+/5 for improved ability to lift shopping bags.    Time 6   Period Weeks   Status On-going     OT SHORT TERM GOAL #5   Title Patient will improve left grip strength by 10 pounds and pinch strength by 6 pounds or more for improved ability to  open containers and maintain grip on objects.     Time 6   Period Weeks   Status On-going     OT SHORT TERM GOAL #6   Title Patient will improve fine motor coordination needed to fasten jewelery by decreasing completion time on nine hole peg test by 10 seconds.    Time 6   Period Weeks   Status On-going     OT SHORT TERM GOAL #7   Title Patient will decrease non purposeful tremors from moderate to minimal for improved abilty to complete daily tasks safely.    Time 6   Period Weeks   Status On-going                  Plan - 01/07/17 1500    Clinical Impression Statement A: Session was focused on grip strength and pinch strengthening. Patient reports that her tremors were very bad today and had increased difficulty with any activity requiring coordination.   Plan P: attempt coordination task if tremors are not too severe. Add proximal shoulder strengthening and focus on shoulder strengthening in general.       Patient will benefit from skilled therapeutic intervention in order to improve the following deficits and impairments:  Decreased coordination, Decreased range of motion, Decreased strength, Impaired UE functional use  Visit Diagnosis: Other lack of coordination    Problem List Patient Active Problem List   Diagnosis Date Noted  . Goals of care, counseling/discussion   . 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 Brain tumor ([name]) 11/28/2016  . 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 Left-sided weakness 11/20/2016  . 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 HYPERCHOLESTEROLEMIA, BORDERLINE 07/28/2008  . ANEMIA, MILD 07/28/2008  . DIVERTICULOSIS OF COLON 07/28/2008  . HEMATURIA, HX OF 07/28/2008  . COLONIC POLYPS 07/27/2008  . RIGHT BUNDLE BRANCH BLOCK 07/27/2008  . PREMATURE ATRIAL CONTRACTIONS 07/27/2008  . HEMORRHOIDS 07/27/2008  . VENOUS INSUFFICIENCY 07/27/2008  . BACK PAIN, LUMBAR  07/27/2008  . SCOLIOSIS 07/27/2008  . PALPITATIONS, HX OF 07/27/2008   Donna Valentine, OTR/L,CBIS  [phone]  01/07/2017, 3:14 PM  [place] [address] [place], [place], [address] Phone: [phone]   Fax:  [phone]  Name: Donna Valentine MRN: [id number] Date of Birth: Jul 07, 1941

## 2017-01-07 NOTE — Therapy (Signed)
[place], [place], [address] Phone: [phone]   Fax:  [phone]  Physical Therapy Treatment  Patient Details  Name: Donna Valentine MRN: [id number] Date of Birth: [birth year] Referring Provider: Ashok Pall   Encounter Date: 01/07/2017      PT End of Session - 01/07/17 1344    Visit Number 3   Number of Visits 9   Date for PT Re-Evaluation 01/22/17   Authorization Type UHC Medicare (G-codes and KX)   Authorization Time Period 12/25/16 to 01/25/17   Authorization - Visit Number 3   Authorization - Number of Visits 10   PT Start Time 1310  Nustep, not included in billing    PT Stop Time 1343   PT Time Calculation (min) 33 min   Activity Tolerance Patient tolerated treatment well   Behavior During Therapy [facility] for tasks assessed/performed      Past Medical History:  Diagnosis Date  . Brain cancer ([place])    GBM  . Cancer [facility])     Past Surgical History:  Procedure Laterality Date  . APPENDECTOMY  1994  . APPLICATION OF CRANIAL NAVIGATION N/A 11/28/2016   Procedure: APPLICATION OF CRANIAL NAVIGATION;  Surgeon: Ashok Pall, MD;  Location: [place];  Service: Neurosurgery;  Laterality: N/A;  . CESAREAN SECTION     x3  . CRANIOTOMY Right 11/28/2016   Procedure: STERIOTACTIC PARIETAL CRANIOTOMY FOR RIGHT FRONTAL TUMOR RESECTION WITH BRAINLAB;  Surgeon: Ashok Pall, MD;  Location: [place];  Service: Neurosurgery;  Laterality: Right;    There were no vitals filed for this visit.      Subjective Assessment - 01/07/17 1308    Subjective Paitent arrives stating she is doing well, she reports she is having more trembling today especially in L UE, this started after surgery but is worse today. She plans to talk to her MD about this, otherwise no major changes.    Pertinent History glioblastoma of R frontal lobe, craniotomy done on 11/28/16   Patient Stated Goals improve strength, enhance independence     Currently in Pain? No/denies            [facility] PT Assessment - 01/07/17 0001      Observation/Other Assessments   Observations significnat B UE tremor noted; L greater than R                      [facility] Adult PT Treatment/Exercise - 01/07/17 0001      Knee/Hip Exercises: Aerobic   Nustep Nustep level 2, hills 4 x8 minutes   not included in billing      Knee/Hip Exercises: Standing   Lateral Step Up Both;1 set;15 reps   Lateral Step Up Limitations 4 inch box    Forward Step Up Both;1 set;15 reps   Forward Step Up Limitations 4 inch box      Knee/Hip Exercises: Supine   Bridges Limitations 1x15      Knee/Hip Exercises: Sidelying   Hip ABduction Both;1 set;10 reps   Hip ABduction Limitations mod assist for correct form/alignment              Balance Exercises - 01/07/17 1326      Balance Exercises: Standing   Stepping Strategy Anterior;Posterior;10 reps   Rockerboard Anterior/posterior;Lateral;EO;Intermittent UE support  2 mintues each way            PT Education - 01/07/17 1344    Education provided Yes   Education Details  possible DOMS    Person(s) Educated Patient   Methods Explanation   Comprehension Verbalized understanding          PT Short Term Goals - 12/25/16 1821      PT SHORT TERM GOAL #1   Title Patient to be able to identify 5/5 safety factors/concepts in order to demonstrate good safety and reduced fall risk    Time 2   Period Weeks   Status New     PT SHORT TERM GOAL #2   Title Patient to be able to ambulate over 727ft in 3MWT in order to demonstrate improved general mobility and community access    Time 2   Period Weeks   Status New     PT SHORT TERM GOAL #3   Title Patient to be independent in correctly and consistently performing targeted HEP, to be updated as appropriate    Time 1   Period Weeks   Status New           PT Long Term Goals - 12/25/16 1822      PT LONG TERM GOAL #1   Title Patient to  demonstrate functional strength 5/5 in all tested muscle groups in order to improve overall gait and balance    Time 4   Period Weeks   Status New     PT LONG TERM GOAL #2   Title Patient to score 24 on DGI in order to show improved dynamic balance skills and overall reduced fall risk    Time 4   Period Weeks   Status New     PT LONG TERM GOAL #3   Title Patient to be participatory in regular aerobic exercise program, at least 20 minutes in duration and 4 days per week, in order to maintain functional gains and imrpove overall health status    Time 4   Period Weeks   Status New               Plan - 01/07/17 1345    Clinical Impression Statement Began session on Nustep with resistance for functional strength, functional activity tolerance, and reciprocal pattern; not included in billing. Otherwise continued focus on functional strength as well as dynamic balance today. Patient reports minimal fatigue with Nustep/extended exercise however does demonstrate signs of physical fatigue throughout session. Patient doing well in general with skilled PT services at this time; will continue to monitor closely due to actively receiving treatments for tumor site.    Rehab Potential Good   Clinical Impairments Affecting Rehab Potential (+) high PLOF, very active at baseline, good recovery after craniotomy; (-) may be starting radiation/chemo soon/effects of these treatments on tolerance to rehab    PT Frequency 2x / week   PT Duration 4 weeks   PT Treatment/Interventions ADLs/Self Care Home Management;Biofeedback;Gait training;Stair training;Functional mobility training;Therapeutic activities;Therapeutic exercise;Balance training;Neuromuscular re-education;Patient/family education;Manual techniques;Energy conservation;Taping   PT Next Visit Plan continue progressing dynamic balance, progress Nustep and CKC strength. Trial boom whackers.    Consulted and Agree with Plan of Care Patient       Patient will benefit from skilled therapeutic intervention in order to improve the following deficits and impairments:  Abnormal gait, Decreased coordination, Decreased mobility, Decreased activity tolerance, Decreased strength, Decreased balance, Difficulty walking  Visit Diagnosis: Muscle weakness (generalized)  Unsteadiness on feet  Other lack of coordination     Problem List Patient Active Problem List   Diagnosis Date Noted  . Goals of care, counseling/discussion   . Glioblastoma  multiforme of frontal lobe ([place]) 12/01/2016  . Brain tumor ([place]) 11/28/2016  . 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 Left-sided weakness 11/20/2016  . 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 HYPERCHOLESTEROLEMIA, BORDERLINE 07/28/2008  . ANEMIA, MILD 07/28/2008  . DIVERTICULOSIS OF COLON 07/28/2008  . HEMATURIA, HX OF 07/28/2008  . COLONIC POLYPS 07/27/2008  . RIGHT BUNDLE BRANCH BLOCK 07/27/2008  . PREMATURE ATRIAL CONTRACTIONS 07/27/2008  . HEMORRHOIDS 07/27/2008  . VENOUS INSUFFICIENCY 07/27/2008  . BACK PAIN, LUMBAR 07/27/2008  . SCOLIOSIS 07/27/2008  . PALPITATIONS, HX OF 07/27/2008    Deniece Ree PT, DPT [place] [address] [place], [place], [address] Phone: [phone]   Fax:  [phone]  Name: Donna Valentine MRN: [id number] Date of Birth: [birth year]

## 2017-01-08 ENCOUNTER — Ambulatory Visit
Admission: RE | Admit: 2017-01-08 | Discharge: 2017-01-08 | Disposition: A | Discharge status: Home / Self Care | Payer: Medicare Other | Source: Ambulatory Visit | Attending: Radiation Oncology | Admitting: Radiation Oncology

## 2017-01-08 ENCOUNTER — Other Ambulatory Visit: Payer: Self-pay | Admitting: Urology

## 2017-01-08 DIAGNOSIS — B370 Candidal stomatitis: Secondary | ICD-10-CM

## 2017-01-08 DIAGNOSIS — C711 Malignant neoplasm of frontal lobe: Secondary | ICD-10-CM

## 2017-01-08 DIAGNOSIS — Z510 Encounter for antineoplastic radiation therapy: Secondary | ICD-10-CM | POA: Diagnosis not present

## 2017-01-08 MED ORDER — DEXAMETHASONE 4 MG PO TABS: 4.0000 mg | ORAL_TABLET | Freq: Every day | ORAL | 0 refills | Status: DC

## 2017-01-08 MED ORDER — NYSTATIN 100000 UNIT/ML MT SUSP: 5.0000 mL | Freq: Four times a day (QID) | OROMUCOSAL | 0 refills | Status: DC

## 2017-01-09 ENCOUNTER — Telehealth: Payer: Self-pay | Admitting: Radiation Oncology

## 2017-01-09 ENCOUNTER — Ambulatory Visit
Admission: RE | Admit: 2017-01-09 | Discharge: 2017-01-09 | Disposition: A | Discharge status: Home / Self Care | Payer: Medicare Other | Source: Ambulatory Visit | Attending: Radiation Oncology | Admitting: Radiation Oncology

## 2017-01-09 DIAGNOSIS — Z510 Encounter for antineoplastic radiation therapy: Secondary | ICD-10-CM | POA: Diagnosis not present

## 2017-01-09 NOTE — Telephone Encounter (Signed)
Understand from the patient her medication for thrush was available for pick up at [facility] but, her steroid was not. Script for steroid found on printer. Called in Decadron 4 mg to [facility] at [facility] per [employer], PA-C order. Made patient aware this was done and she verbalized understanding.

## 2017-01-12 ENCOUNTER — Ambulatory Visit (HOSPITAL_COMMUNITY): Payer: Medicare Other

## 2017-01-12 ENCOUNTER — Encounter (HOSPITAL_COMMUNITY): Payer: Self-pay

## 2017-01-12 ENCOUNTER — Other Ambulatory Visit: Payer: Self-pay | Admitting: *Deleted

## 2017-01-12 ENCOUNTER — Ambulatory Visit (HOSPITAL_COMMUNITY): Payer: Medicare Other | Attending: Neurosurgery | Admitting: Physical Therapy

## 2017-01-12 ENCOUNTER — Ambulatory Visit
Admission: RE | Admit: 2017-01-12 | Discharge: 2017-01-12 | Disposition: A | Discharge status: Home / Self Care | Payer: Medicare Other | Source: Ambulatory Visit | Attending: Radiation Oncology | Admitting: Radiation Oncology

## 2017-01-12 DIAGNOSIS — M6281 Muscle weakness (generalized): Secondary | ICD-10-CM | POA: Diagnosis present

## 2017-01-12 DIAGNOSIS — R278 Other lack of coordination: Secondary | ICD-10-CM

## 2017-01-12 DIAGNOSIS — R2681 Unsteadiness on feet: Secondary | ICD-10-CM | POA: Diagnosis present

## 2017-01-12 DIAGNOSIS — C719 Malignant neoplasm of brain, unspecified: Secondary | ICD-10-CM

## 2017-01-12 DIAGNOSIS — Z510 Encounter for antineoplastic radiation therapy: Secondary | ICD-10-CM | POA: Diagnosis not present

## 2017-01-12 MED ORDER — TEMOZOLOMIDE 140 MG PO CAPS: 75.0000 mg/m2/d | ORAL_CAPSULE | Freq: Every day | ORAL | 0 refills | Status: DC

## 2017-01-12 NOTE — Therapy (Signed)
[place], [place], [address] Phone: [phone]   Fax:  [phone]  Physical Therapy Treatment  Patient Details  Name: Donna Valentine MRN: [id number] Date of Birth: May 02, 1941 Referring Provider: Ashok Pall   Encounter Date: 01/12/2017      PT End of Session - 01/12/17 1324    Visit Number 4   Number of Visits 9   Date for PT Re-Evaluation 01/22/17   Authorization Type UHC Medicare (G-codes and KX)   Authorization Time Period 12/25/16 to 01/25/17   Authorization - Visit Number 4   Authorization - Number of Visits 10   PT Start Time 1310  Pt arrived late    PT Stop Time 1345   PT Time Calculation (min) 35 min   Activity Tolerance Patient tolerated treatment well;Patient limited by fatigue   Behavior During Therapy [facility] for tasks assessed/performed      Past Medical History:  Diagnosis Date  . Brain cancer ([place])    GBM  . Cancer [facility])     Past Surgical History:  Procedure Laterality Date  . APPENDECTOMY  1994  . APPLICATION OF CRANIAL NAVIGATION N/A 11/28/2016   Procedure: APPLICATION OF CRANIAL NAVIGATION;  Surgeon: Ashok Pall, MD;  Location: [place];  Service: Neurosurgery;  Laterality: N/A;  . CESAREAN SECTION     x3  . CRANIOTOMY Right 11/28/2016   Procedure: STERIOTACTIC PARIETAL CRANIOTOMY FOR RIGHT FRONTAL TUMOR RESECTION WITH BRAINLAB;  Surgeon: Ashok Pall, MD;  Location: [place];  Service: Neurosurgery;  Laterality: Right;    There were no vitals filed for this visit.      Subjective Assessment - 01/12/17 1313    Subjective Pt reports increased fatigue and fluid retention today. Otherwise things are going well.    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 No/denies                         [facility] Adult PT Treatment/Exercise - 01/12/17 0001       Knee/Hip Exercises: Standing   Heel Raises Both;1 set;20 reps   Heel Raises Limitations Heel raises x20 reps on decline    Forward Step Up Both;Step Height: 6";10 reps;2 sets   Forward Step Up Limitations with knee drive, no UE support   Other Standing Knee Exercises sidestepping over 12" hurdles x10 reps each, No UE support              Balance Exercises - 01/12/17 1331      Balance Exercises: Standing   Tandem Stance 3 reps;Eyes open;20 secs  half bolster   Tandem Gait Forward;3 reps   Step Over Hurdles / Cones step over small/large hurdles with CGA, x3 RT            PT Education - 01/12/17 1406    Education provided Yes   Education Details discussed performance during PT session; encouraged pt to speak with OT regarding MD discussion of her tremors and benefits of OT overall    Person(s) Educated Patient   Methods Explanation   Comprehension Verbalized understanding          PT Short Term Goals - 12/25/16 1821      PT SHORT TERM GOAL #1   Title Patient to be able to identify 5/5 safety factors/concepts in order to demonstrate good safety and reduced fall  risk    Time 2   Period Weeks   Status New     PT SHORT TERM GOAL #2   Title Patient to be able to ambulate over 78ft in 3MWT in order to demonstrate improved general mobility and community access    Time 2   Period Weeks   Status New     PT SHORT TERM GOAL #3   Title Patient to be independent in correctly and consistently performing targeted HEP, to be updated as appropriate    Time 1   Period Weeks   Status New           PT Long Term Goals - 12/25/16 1822      PT LONG TERM GOAL #1   Title Patient to demonstrate functional strength 5/5 in all tested muscle groups in order to improve overall gait and balance    Time 4   Period Weeks   Status New     PT LONG TERM GOAL #2   Title Patient to score 24 on DGI in order to show improved dynamic balance skills and overall reduced fall risk    Time  4   Period Weeks   Status New     PT LONG TERM GOAL #3   Title Patient to be participatory in regular aerobic exercise program, at least 20 minutes in duration and 4 days per week, in order to maintain functional gains and imrpove overall health status    Time 4   Period Weeks   Status New               Plan - 01/12/17 1433    Clinical Impression Statement Pt arrived reporting increased fatigue today secondary to her treatments. This was monitored throughout the session. Continued with therex and balance activity and pt was able to perform all activities with minimal unsteadiness and without LOB. Currently she feels she has made great progress since her hospital d/c. Will continue with current POC.   Rehab Potential Good   Clinical Impairments Affecting Rehab Potential (+) high PLOF, very active at baseline, good recovery after craniotomy; (-) may be starting radiation/chemo soon/effects of these treatments on tolerance to rehab    PT Frequency 2x / week   PT Duration 4 weeks   PT Treatment/Interventions ADLs/Self Care Home Management;Biofeedback;Gait training;Stair training;Functional mobility training;Therapeutic activities;Therapeutic exercise;Balance training;Neuromuscular re-education;Patient/family education;Manual techniques;Energy conservation;Taping   PT Next Visit Plan dynamic balance activity; stepping activity for balance    PT Home Exercise Plan Eval: sit to stand no UEs, seated clams with red and green TB, SLS, tandem stance    Consulted and Agree with Plan of Care Patient      Patient will benefit from skilled therapeutic intervention in order to improve the following deficits and impairments:  Abnormal gait, Decreased coordination, Decreased mobility, Decreased activity tolerance, Decreased strength, Decreased balance, Difficulty walking  Visit Diagnosis: Other lack of coordination  Muscle weakness (generalized)  Unsteadiness on feet     Problem  List Patient Active Problem List   Diagnosis Date Noted  . Oral candida 01/08/2017  . Goals of care, counseling/discussion   . 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 Brain tumor ([place]) 11/28/2016  . 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 Left-sided weakness 11/20/2016  . 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 HYPERCHOLESTEROLEMIA, BORDERLINE 07/28/2008  . ANEMIA, MILD 07/28/2008  . DIVERTICULOSIS OF COLON 07/28/2008  . HEMATURIA, HX OF 07/28/2008  . COLONIC  POLYPS 07/27/2008  . RIGHT BUNDLE BRANCH BLOCK 07/27/2008  . PREMATURE ATRIAL CONTRACTIONS 07/27/2008  . HEMORRHOIDS 07/27/2008  . VENOUS INSUFFICIENCY 07/27/2008  . BACK PAIN, LUMBAR 07/27/2008  . SCOLIOSIS 07/27/2008  . PALPITATIONS, HX OF 07/27/2008    3:26 PM,01/12/17 Elly Modena PT, DPT [facility] [address] [place], [place], [address] Phone: [phone]   Fax:  [phone]  Name: Donna Valentine MRN: [id number] Date of Birth: 04/15/1941

## 2017-01-12 NOTE — Therapy (Signed)
[place], [place], [address] Phone: [phone]   Fax:  [phone]  Occupational Therapy Treatment  Patient Details  Name: Donna Valentine MRN: [id number] Date of Birth: March 29, 1941 Referring Provider: Dr. Ashok Pall  Encounter Date: 01/12/2017      OT End of Session - 01/12/17 1709    Visit Number 3   Number of Visits 12   Date for OT Re-Evaluation 02/05/17  mini reassess on 01/22/17   Authorization Type UHC Medicare   Authorization Time Period before 10th visit   Authorization - Visit Number 3   Authorization - Number of Visits 10   OT Start Time 3710   OT Stop Time 1430   OT Time Calculation (min) 43 min   Activity Tolerance Patient tolerated treatment well   Behavior During Therapy [facility] for tasks assessed/performed      Past Medical History:  Diagnosis Date  . Brain cancer ([place])    GBM  . Cancer [facility])     Past Surgical History:  Procedure Laterality Date  . APPENDECTOMY  1994  . APPLICATION OF CRANIAL NAVIGATION N/A 11/28/2016   Procedure: APPLICATION OF CRANIAL NAVIGATION;  Surgeon: Ashok Pall, MD;  Location: [place];  Service: Neurosurgery;  Laterality: N/A;  . CESAREAN SECTION     x3  . CRANIOTOMY Right 11/28/2016   Procedure: STERIOTACTIC PARIETAL CRANIOTOMY FOR RIGHT FRONTAL TUMOR RESECTION WITH BRAINLAB;  Surgeon: Ashok Pall, MD;  Location: [place];  Service: Neurosurgery;  Laterality: Right;    There were no vitals filed for this visit.      Subjective Assessment - 01/12/17 1351    Subjective  S: The Doctor said that my tremors were related to my medication and they won't stop until I'm done with the medication.    Currently in Pain? No/denies            [facility] OT Assessment - 01/12/17 1352      Assessment   Diagnosis LUE Weakness S/P removal of right frontal lobe glioblastoma multiforme     Precautions   Precautions Other (comment)   Precaution Comments brain tumor, hx  craniotomy                   OT Treatments/Exercises (OP) - 01/12/17 1352      Exercises   Exercises Hand;Shoulder     Shoulder Exercises: Seated   Other Seated Exercises Shoulder strengthening completed with small green therapy ball exercises including shoulder flexion, horizontal abduction, woodchops left and right, overhead press, and chest press. 10X     Shoulder Exercises: ROM/Strengthening   UBE (Upper Arm Bike) Level 1 2' reverse 2' forward   Proximal Shoulder Strengthening, Seated 10X; no rest breaks     Hand Exercises   Theraputty - Locate Pegs red - 6 beads located               OT Short Term Goals - 01/07/17 1513      OT SHORT TERM GOAL #1   Title Patient will be educated and independent with an HEP for improved functional use of LUE with all daily tasks.    Time 6   Period Weeks   Status On-going     OT SHORT TERM GOAL #2   Title Patient will return to prior level of function, using her left upper extremity actively with all tasks.    Time 6   Period Weeks   Status On-going     OT  SHORT TERM GOAL #3   Title Patient will improve left shoulder A/ROM to WNL for improved ability to reach into overhead cabinets.    Time 6   Period Weeks   Status On-going     OT SHORT TERM GOAL #4   Title Patient will improve left upper extremity strength to 4+/5 for improved ability to lift shopping bags.    Time 6   Period Weeks   Status On-going     OT SHORT TERM GOAL #5   Title Patient will improve left grip strength by 10 pounds and pinch strength by 6 pounds or more for improved ability to open containers and maintain grip on objects.     Time 6   Period Weeks   Status On-going     OT SHORT TERM GOAL #6   Title Patient will improve fine motor coordination needed to fasten jewelery by decreasing completion time on nine hole peg test by 10 seconds.    Time 6   Period Weeks   Status On-going     OT SHORT TERM GOAL #7   Title Patient will decrease  non purposeful tremors from moderate to minimal for improved abilty to complete daily tasks safely.    Time 6   Period Weeks   Status On-going                  Plan - 01/12/17 1709    Clinical Impression Statement A: patient reports that her tremors will not stop as long as she is on her medication per her doctor. tremors due not decrease even with use of wrist weights; they actually increase. Session focused on UB strength and finished at table for grip strengthening.    Plan P: Monitor tremors and complete coordination activites as they allow. Coordination may have to be held until medication is finished.       Patient will benefit from skilled therapeutic intervention in order to improve the following deficits and impairments:  Decreased coordination, Decreased range of motion, Decreased strength, Impaired UE functional use  Visit Diagnosis: Other lack of coordination  Muscle weakness (generalized)    Problem List Patient Active Problem List   Diagnosis Date Noted  . Oral candida 01/08/2017  . Goals of care, counseling/discussion   . 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 Brain tumor ([place]) 11/28/2016  . 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 Left-sided weakness 11/20/2016  . 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 HYPERCHOLESTEROLEMIA, BORDERLINE 07/28/2008  . ANEMIA, MILD 07/28/2008  . DIVERTICULOSIS OF COLON 07/28/2008  . HEMATURIA, HX OF 07/28/2008  . COLONIC POLYPS 07/27/2008  . RIGHT BUNDLE BRANCH BLOCK 07/27/2008  . PREMATURE ATRIAL CONTRACTIONS 07/27/2008  . HEMORRHOIDS 07/27/2008  . VENOUS INSUFFICIENCY 07/27/2008  . BACK PAIN, LUMBAR 07/27/2008  . SCOLIOSIS 07/27/2008  . PALPITATIONS, HX OF 07/27/2008   [name], OTR/L,CBIS  [phone]  01/12/2017, 5:14 PM  [place] [address] [place], [place], [address] Phone:  [phone]   Fax:  [phone]  Name: Donna Valentine MRN: [id number] Date of Birth: 1940/10/24

## 2017-01-13 ENCOUNTER — Ambulatory Visit
Admission: RE | Admit: 2017-01-13 | Discharge: 2017-01-13 | Disposition: A | Discharge status: Home / Self Care | Payer: Medicare Other | Source: Ambulatory Visit | Attending: Radiation Oncology | Admitting: Radiation Oncology

## 2017-01-13 ENCOUNTER — Telehealth: Payer: Self-pay | Admitting: Radiation Oncology

## 2017-01-13 DIAGNOSIS — C711 Malignant neoplasm of frontal lobe: Secondary | ICD-10-CM

## 2017-01-13 DIAGNOSIS — Z510 Encounter for antineoplastic radiation therapy: Secondary | ICD-10-CM | POA: Diagnosis not present

## 2017-01-13 MED ORDER — DEXAMETHASONE 4 MG PO TABS: ORAL_TABLET | ORAL | 0 refills | Status: DC

## 2017-01-13 NOTE — Telephone Encounter (Signed)
Received word from the [place], RT on L3 that the patient is questioning her decadron. Phoned patient and she doesn't understand why the directions and quantity of the decadron is different. Explained we (Dr. Tammi Klippel is tapering her off the medication). She questions why Dr. Tammi Klippel is adjusting her medication and no Dr. Christella Noa, "the one who originally prescribed it." Patient refuses to begin taper until she has spoken with Dr. Tammi Klippel or Dr. Christella Noa. Explained Dr. Tammi Klippel is out of town but, this RN would request that his PA phone her. Patient was satisfied with that.

## 2017-01-13 NOTE — Telephone Encounter (Signed)
I spoke with the patient to communicate the dexamethasone taper I think best for her. She's now two weeks into her treatment and this is an appropriate time to adjust her medication. We reviewed switching to 2mg  Daily x 1 week starting this Friday, then 2 mg QOD x 1 week starting Friday 01/23/17, then stop. She's still taking H2 blockers for GI prophylaxis and will notify us of any side effects with her taper.

## 2017-01-14 ENCOUNTER — Telehealth: Payer: Self-pay | Admitting: Oncology

## 2017-01-14 ENCOUNTER — Ambulatory Visit (HOSPITAL_COMMUNITY): Payer: Medicare Other

## 2017-01-14 ENCOUNTER — Encounter (HOSPITAL_COMMUNITY): Payer: Self-pay

## 2017-01-14 ENCOUNTER — Ambulatory Visit
Admission: RE | Admit: 2017-01-14 | Discharge: 2017-01-14 | Disposition: A | Discharge status: Home / Self Care | Payer: Medicare Other | Source: Ambulatory Visit | Attending: Radiation Oncology | Admitting: Radiation Oncology

## 2017-01-14 DIAGNOSIS — R278 Other lack of coordination: Secondary | ICD-10-CM

## 2017-01-14 DIAGNOSIS — M6281 Muscle weakness (generalized): Secondary | ICD-10-CM

## 2017-01-14 DIAGNOSIS — R2681 Unsteadiness on feet: Secondary | ICD-10-CM

## 2017-01-14 DIAGNOSIS — Z510 Encounter for antineoplastic radiation therapy: Secondary | ICD-10-CM | POA: Diagnosis not present

## 2017-01-14 NOTE — Telephone Encounter (Signed)
Received call from patient's daughter, Jan.  She said there has been confusion about her mom's decadron taper and is very frustrated.  She said her mom has been taking 4 mg of decadron since 2/8 and it was originally prescribed by Dr. Christella Noa.  The patient had talked to Joaquim Lai, RN about needing a refill on Monday.  She then was told that she was to begin a taper at 2 mg.  She went to pick up the refill and saw that it was for 4 mg so she told the pharmacy that the prescription was wrong.  She is now out of decadron.  Advised that per the prescription from Shona Simpson, [place], she should pick up the prescription and begin the taper this Friday, 4/6 by taking 1/2 of a 4 mg tablet for 7 days and then 1/2 of a 4 mg tablet every other day for 7 days starting 01/23/17 then stop.  Advised her that 30 tablets have been prescribed so she will have enough tablets to take until Friday when the taper begins.  Jan verbalized agreement and understanding.  Also advised her that I will verify the start date with Bryson Ha and call her back.

## 2017-01-14 NOTE — Telephone Encounter (Signed)
I called and spoke with the pt's daughter and we reviewed the rationale for a slow taper of her steroids.

## 2017-01-14 NOTE — Therapy (Signed)
[place], [place], [address] Phone: [phone]   Fax:  [phone]  Physical Therapy Treatment  Patient Details  Name: Donna Valentine MRN: [id number] Date of Birth: Oct 24, 1940 Referring Provider: Ashok Pall   Encounter Date: 01/14/2017      PT End of Session - 01/14/17 1306    Visit Number 5   Number of Visits 9   Date for PT Re-Evaluation 01/22/17   Authorization Type UHC Medicare (G-codes and KX)   Authorization Time Period 12/25/16 to 01/25/17   Authorization - Visit Number 5   Authorization - Number of Visits 10   PT Start Time 1302   PT Stop Time 1350   PT Time Calculation (min) 48 min   Equipment Utilized During Treatment Gait belt   Activity Tolerance Patient tolerated treatment well;Patient limited by fatigue   Behavior During Therapy [facility] for tasks assessed/performed      Past Medical History:  Diagnosis Date  . Brain cancer ([place])    GBM  . Cancer [facility])     Past Surgical History:  Procedure Laterality Date  . APPENDECTOMY  1994  . APPLICATION OF CRANIAL NAVIGATION N/A 11/28/2016   Procedure: APPLICATION OF CRANIAL NAVIGATION;  Surgeon: Ashok Pall, MD;  Location: [place];  Service: Neurosurgery;  Laterality: N/A;  . CESAREAN SECTION     x3  . CRANIOTOMY Right 11/28/2016   Procedure: STERIOTACTIC PARIETAL CRANIOTOMY FOR RIGHT FRONTAL TUMOR RESECTION WITH BRAINLAB;  Surgeon: Ashok Pall, MD;  Location: [place];  Service: Neurosurgery;  Laterality: Right;    There were no vitals filed for this visit.      Subjective Assessment - 01/14/17 1305    Subjective Pt reports plan for reduction in steroids to reduce UE tremors, otherwise everything is going well.  No reports of pain today.    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 No/denies            [facility] Adult PT  Treatment/Exercise - 01/14/17 0001      Knee/Hip Exercises: Aerobic   Nustep Nustep hills L2 x 5 min due to time restraint prior OT session (not included in billing)     Knee/Hip Exercises: Standing   Heel Raises Both;1 set;20 reps   Heel Raises Limitations Heel raises x20 reps on decline    Forward Step Up Both;15 reps;Hand Hold: 0;Step Height: 6"   Forward Step Up Limitations no UE support, working on control             Balance Exercises - 01/14/17 1317      Balance Exercises: Standing   Tandem Stance Eyes open;3 reps;30 secs  half bolster   SLS Eyes open;Foam/compliant surface;3 reps;20 secs   Balance Beam tandem 2RT   Step Over Hurdles / Cones step over small/large hurdles with CGA forward and sidestep, x3 RT              PT Short Term Goals - 12/25/16 1821      PT SHORT TERM GOAL #1   Title Patient to be able to identify 5/5 safety factors/concepts in order to demonstrate good safety and reduced fall risk    Time 2   Period Weeks   Status New     PT SHORT TERM GOAL #2   Title Patient to be able to ambulate over 713ft in 3MWT in order  to demonstrate improved general mobility and community access    Time 2   Period Weeks   Status New     PT SHORT TERM GOAL #3   Title Patient to be independent in correctly and consistently performing targeted HEP, to be updated as appropriate    Time 1   Period Weeks   Status New           PT Long Term Goals - 12/25/16 1822      PT LONG TERM GOAL #1   Title Patient to demonstrate functional strength 5/5 in all tested muscle groups in order to improve overall gait and balance    Time 4   Period Weeks   Status New     PT LONG TERM GOAL #2   Title Patient to score 24 on DGI in order to show improved dynamic balance skills and overall reduced fall risk    Time 4   Period Weeks   Status New     PT LONG TERM GOAL #3   Title Patient to be participatory in regular aerobic exercise program, at least 20 minutes in  duration and 4 days per week, in order to maintain functional gains and imrpove overall health status    Time 4   Period Weeks   Status New               Plan - 01/14/17 1525    Clinical Impression Statement Session focus on therex for functional strengthening and balance training.  Pt continues to have some unsteadiness with static standing on dynamic surface requiring HHA or to step out of NBOS for LOB episodes.  Improved balance noted with tandem gait on static surface, progressed to balance beam with min guard for safety.  Added step up/step down for functional strengthening with minimal HHA for balance training, 1 HHA required with step down.   No reports of pain through session, was limited by fatgiue with activities at [place].     Rehab Potential Good   Clinical Impairments Affecting Rehab Potential (+) high PLOF, very active at baseline, good recovery after craniotomy; (-) may be starting radiation/chemo soon/effects of these treatments on tolerance to rehab    PT Frequency 2x / week   PT Duration 4 weeks   PT Treatment/Interventions ADLs/Self Care Home Management;Biofeedback;Gait training;Stair training;Functional mobility training;Therapeutic activities;Therapeutic exercise;Balance training;Neuromuscular re-education;Patient/family education;Manual techniques;Energy conservation;Taping   PT Next Visit Plan dynamic balance activity; stepping activity for balance.  Stair training with least HHA for balance and functional strengthening.     PT Home Exercise Plan Eval: sit to stand no UEs, seated clams with red and green TB, SLS, tandem stance       Patient will benefit from skilled therapeutic intervention in order to improve the following deficits and impairments:  Abnormal gait, Decreased coordination, Decreased mobility, Decreased activity tolerance, Decreased strength, Decreased balance, Difficulty walking  Visit Diagnosis: Other lack of coordination  Muscle weakness  (generalized)  Unsteadiness on feet     Problem List Patient Active Problem List   Diagnosis Date Noted  . Oral candida 01/08/2017  . Goals of care, counseling/discussion   . Glioblastoma multiforme of frontal lobe ([employer]) 12/01/2016  . Brain tumor ([place]) 11/28/2016  . 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 Left-sided weakness 11/20/2016  . 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 HYPERCHOLESTEROLEMIA, BORDERLINE 07/28/2008  . ANEMIA, MILD 07/28/2008  . DIVERTICULOSIS OF COLON 07/28/2008  . HEMATURIA, HX OF 07/28/2008  .  COLONIC POLYPS 07/27/2008  . RIGHT BUNDLE BRANCH BLOCK 07/27/2008  . PREMATURE ATRIAL CONTRACTIONS 07/27/2008  . HEMORRHOIDS 07/27/2008  . VENOUS INSUFFICIENCY 07/27/2008  . BACK PAIN, LUMBAR 07/27/2008  . SCOLIOSIS 07/27/2008  . PALPITATIONS, HX OF 07/27/2008   Ihor Austin, [place]; [place]  Aldona Lento 01/14/2017, 3:33 PM  [place] [address] [place], [place], [address] Phone: [phone]   Fax:  [phone]  Name: Ricka [place] MRN: [id number] Date of Birth: June 28, 1941

## 2017-01-14 NOTE — Therapy (Signed)
[place], [place], [address] Phone: [phone]   Fax:  [phone]  Occupational Therapy Treatment  Patient Details  Name: Donna Valentine MRN: [id number] Date of Birth: 1940-12-30 Referring Provider: Dr. Ashok Pall  Encounter Date: 01/14/2017      OT End of Session - 01/14/17 1514    Visit Number 4   Number of Visits 12   Date for OT Re-Evaluation 02/05/17  mini reassess on 01/22/17   Authorization Type UHC Medicare   Authorization Time Period before 10th visit   Authorization - Visit Number 4   Authorization - Number of Visits 10   OT Start Time 1350   OT Stop Time 1430   OT Time Calculation (min) 40 min   Activity Tolerance Patient tolerated treatment well   Behavior During Therapy [facility] for tasks assessed/performed      Past Medical History:  Diagnosis Date  . Brain cancer ([place])    [facility]  . Cancer [facility])     Past Surgical History:  Procedure Laterality Date  . APPENDECTOMY  1994  . APPLICATION OF CRANIAL NAVIGATION N/A 11/28/2016   Procedure: APPLICATION OF CRANIAL NAVIGATION;  Surgeon: Ashok Pall, MD;  Location: [place];  Service: Neurosurgery;  Laterality: N/A;  . CESAREAN SECTION     x3  . CRANIOTOMY Right 11/28/2016   Procedure: STERIOTACTIC PARIETAL CRANIOTOMY FOR RIGHT FRONTAL TUMOR RESECTION WITH BRAINLAB;  Surgeon: Ashok Pall, MD;  Location: [place];  Service: Neurosurgery;  Laterality: Right;    There were no vitals filed for this visit.      Subjective Assessment - 01/14/17 1414    Subjective  S: They are going to start slowing decreasing my steroid dosage    Currently in Pain? 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     Precautions   Precautions Other (comment)   Precaution Comments brain tumor, hx craniotomy                   OT  Treatments/Exercises (OP) - 01/14/17 1417      Exercises   Exercises Hand     Hand Exercises   Theraputty Flatten;Roll;Pinch   Theraputty - Flatten red   Theraputty - Roll red   Theraputty - Pinch red - 3 point   Hand Gripper with Large Beads all beads with gripper set at 29#   Hand Gripper with Medium Beads all beads with gripper set at 29#   Hand Gripper with Small Beads 4 beads with gripper set at 29#; remaining beads with gripper set at27#   Sponges 13,13,13             Balance Exercises - 01/14/17 1317      Balance Exercises: Standing   Tandem Stance Eyes open;3 reps;30 secs  half bolster   Balance Beam tandem 2RT   Step Over Hurdles / [place] step over small/large hurdles with CGA forward and sidestep, x3 RT              OT Short Term Goals - 01/07/17 1513      OT SHORT TERM GOAL #1   Title Patient will be educated and independent with an HEP for improved functional use of LUE with all daily tasks.    Time 6   Period Weeks  Status On-going     OT SHORT TERM GOAL #2   Title Patient will return to prior level of function, using her left upper extremity actively with all tasks.    Time 6   Period Weeks   Status On-going     OT SHORT TERM GOAL #3   Title Patient will improve left shoulder A/ROM to WNL for improved ability to reach into overhead cabinets.    Time 6   Period Weeks   Status On-going     OT SHORT TERM GOAL #4   Title Patient will improve left upper extremity strength to 4+/5 for improved ability to lift shopping bags.    Time 6   Period Weeks   Status On-going     OT SHORT TERM GOAL #5   Title Patient will improve left grip strength by 10 pounds and pinch strength by 6 pounds or more for improved ability to open containers and maintain grip on objects.     Time 6   Period Weeks   Status On-going     OT SHORT TERM GOAL #6   Title Patient will improve fine motor coordination needed to fasten jewelery by decreasing completion time on  nine hole peg test by 10 seconds.    Time 6   Period Weeks   Status On-going     OT SHORT TERM GOAL #7   Title Patient will decrease non purposeful tremors from moderate to minimal for improved abilty to complete daily tasks safely.    Time 6   Period Weeks   Status On-going                  Plan - 01/14/17 1515    Clinical Impression Statement A: Pt reports that her doctor will begin to decrease her steroids gradually and hopefully that will make her tremors decrease as well. Unable to complete any coordination tasks due to tremors this session. Patient was able to complete small beads with gripper this session. VC to rest hand when needed and as tremors became greater.    Plan P: Resume coordination tasks as tremors decrease. Complete theraband shoulder strengthening exercises.       Patient will benefit from skilled therapeutic intervention in order to improve the following deficits and impairments:  Decreased coordination, Decreased range of motion, Decreased strength, Impaired UE functional use  Visit Diagnosis: Other lack of coordination  Muscle weakness (generalized)    Problem List Patient Active Problem List   Diagnosis Date Noted  . Oral candida 01/08/2017  . Goals of care, counseling/discussion   . Glioblastoma multiforme of frontal lobe ([place]) 12/01/2016  . Brain tumor ([place]) 11/28/2016  . 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 Left-sided weakness 11/20/2016  . 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 HYPERCHOLESTEROLEMIA, BORDERLINE 07/28/2008  . ANEMIA, MILD 07/28/2008  . DIVERTICULOSIS OF COLON 07/28/2008  . HEMATURIA, HX OF 07/28/2008  . COLONIC POLYPS 07/27/2008  . RIGHT BUNDLE BRANCH BLOCK 07/27/2008  . PREMATURE ATRIAL CONTRACTIONS 07/27/2008  . HEMORRHOIDS 07/27/2008  . VENOUS INSUFFICIENCY 07/27/2008  . BACK PAIN, LUMBAR 07/27/2008  . SCOLIOSIS 07/27/2008  . PALPITATIONS, HX OF 07/27/2008    [name], OTR/L,CBIS  [phone]  01/14/2017, 3:17 PM  [facility] [address] [place], [place], [address] Phone: [phone]   Fax:  [phone]  Name: Donna Valentine MRN: [id number] Date of Birth: [birth year]

## 2017-01-15 ENCOUNTER — Ambulatory Visit
Admission: RE | Admit: 2017-01-15 | Discharge: 2017-01-15 | Disposition: A | Discharge status: Home / Self Care | Payer: Medicare Other | Source: Ambulatory Visit | Attending: Radiation Oncology | Admitting: Radiation Oncology

## 2017-01-15 DIAGNOSIS — Z510 Encounter for antineoplastic radiation therapy: Secondary | ICD-10-CM | POA: Diagnosis not present

## 2017-01-16 ENCOUNTER — Ambulatory Visit
Admission: RE | Admit: 2017-01-16 | Discharge: 2017-01-16 | Disposition: A | Discharge status: Home / Self Care | Payer: Medicare Other | Source: Ambulatory Visit | Attending: Radiation Oncology | Admitting: Radiation Oncology

## 2017-01-16 VITALS — BP 128/71 | HR 64 | Resp 16 | Wt 187.0 lb

## 2017-01-16 DIAGNOSIS — Z510 Encounter for antineoplastic radiation therapy: Secondary | ICD-10-CM | POA: Diagnosis not present

## 2017-01-16 DIAGNOSIS — C711 Malignant neoplasm of frontal lobe: Secondary | ICD-10-CM

## 2017-01-16 NOTE — Progress Notes (Addendum)
Weight and vitals stable. Denies pain. Decadron taper at 4 mg once per day. Donna Valentine has resolved with nystatin. Reports she continues Temodar as directed with zofran before to prevent nausea and vomiting. Denies headache, dizziness, diplopia or ringing in the ears. Alopecia noted. Denies scalp tenderness. Bilateral lower leg edema noted. Denies fatigue.   BP 128/71 (BP Location: Left Arm, Patient Position: Sitting, Cuff Size: Normal)   Pulse 64   Resp 16   Wt 187 lb (84.8 kg)   SpO2 100%   BMI 30.18 kg/m  Wt Readings from Last 3 Encounters:  01/16/17 187 lb (84.8 kg)  12/15/16 176 lb 14.4 oz (80.2 kg)  12/08/16 176 lb (79.8 kg)

## 2017-01-16 NOTE — Progress Notes (Signed)
  Radiation Oncology         [phone] ________________________________  Name: Donna Valentine MRN: [id number]  Date: 01/16/2017  DOB: 1941/05/28    Weekly Radiation Therapy Management    ICD-9-CM ICD-10-CM   1. 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 . . . . . . . The patient presents for routine under treatment assessment.                                 Weight and vitals stable. Denies pain. Taking Decadron taper at 4 mg once per day. Donna Valentine has resolved with nystatin. Reports she continues Temodar as directed with Zofran before to prevent nausea and vomiting. Denies headache, nausea, dizziness, diplopia, or ringing in the ears. Alopecia noted. Denies scalp tenderness. Denies fatigue. Appetite and taste are good. She denies drainage at the surgical site. Reports swelling in bilateral ankles.                                 Set-up films were reviewed.                                 The chart was checked. Physical Findings. . .  weight is 187 lb (84.8 kg). Her blood pressure is 128/71 and her pulse is 64. Her respiration is 16 and oxygen saturation is 100%. . Weight essentially stable.  No significant changes. Lungs are clear to auscultation bilaterally. Heart has regular rate and rhythm. Abdomen soft, non-tender, normal bowel sounds. No thrush noted. Mild hair loss in the right scalp area. Mild edema in bilateral feet/ankle area. Impression . . . . . . . The patient is tolerating radiation. Plan . . . . . . . . . . . . Continue treatment as planned.  ________________________________   Blair Promise, PhD, MD  This document serves as a record of services personally performed by Gery Pray, MD. It was created on his behalf by Arlyce Harman, a trained medical scribe. The creation of this record is based on the scribe's personal observations and the provider's statements to them.  This document has been checked and approved by the attending provider.

## 2017-01-19 ENCOUNTER — Telehealth (HOSPITAL_COMMUNITY): Payer: Self-pay

## 2017-01-19 ENCOUNTER — Ambulatory Visit (HOSPITAL_COMMUNITY): Payer: Medicare Other

## 2017-01-19 ENCOUNTER — Ambulatory Visit (HOSPITAL_COMMUNITY): Payer: Medicare Other | Admitting: Physical Therapy

## 2017-01-19 ENCOUNTER — Encounter (HOSPITAL_COMMUNITY): Payer: Self-pay

## 2017-01-19 ENCOUNTER — Ambulatory Visit: Payer: Medicare Other

## 2017-01-19 ENCOUNTER — Ambulatory Visit
Admission: RE | Admit: 2017-01-19 | Discharge: 2017-01-19 | Disposition: A | Discharge status: Home / Self Care | Payer: Medicare Other | Source: Ambulatory Visit | Attending: Radiation Oncology | Admitting: Radiation Oncology

## 2017-01-19 DIAGNOSIS — R278 Other lack of coordination: Secondary | ICD-10-CM | POA: Diagnosis not present

## 2017-01-19 DIAGNOSIS — M6281 Muscle weakness (generalized): Secondary | ICD-10-CM

## 2017-01-19 DIAGNOSIS — Z510 Encounter for antineoplastic radiation therapy: Secondary | ICD-10-CM | POA: Diagnosis not present

## 2017-01-19 DIAGNOSIS — R2681 Unsteadiness on feet: Secondary | ICD-10-CM

## 2017-01-19 NOTE — Therapy (Signed)
[place], [place], [address] Phone: [phone]   Fax:  [phone]  Occupational Therapy Treatment  Patient Details  Name: Donna Valentine MRN: [id number] Date of Birth: Jul 08, 1941 Referring Provider: Dr. Ashok Pall  Encounter Date: 01/19/2017      OT End of Session - 01/19/17 1614    Visit Number 5   Number of Visits 12   Date for OT Re-Evaluation 02/05/17  mini reassess on 01/22/17   Authorization Type UHC Medicare   Authorization Time Period before 10th visit   Authorization - Visit Number 5   Authorization - Number of Visits 10   OT Start Time 7622   OT Stop Time 1430   OT Time Calculation (min) 45 min   Activity Tolerance Patient tolerated treatment well   Behavior During Therapy [facility] for tasks assessed/performed      Past Medical History:  Diagnosis Date  . Brain cancer ([place])    [facility]M  . Cancer [facility])     Past Surgical History:  Procedure Laterality Date  . APPENDECTOMY  1994  . APPLICATION OF CRANIAL NAVIGATION N/A 11/28/2016   Procedure: APPLICATION OF CRANIAL NAVIGATION;  Surgeon: Ashok Pall, MD;  Location: [place];  Service: Neurosurgery;  Laterality: N/A;  . CESAREAN SECTION     x3  . CRANIOTOMY Right 11/28/2016   Procedure: STERIOTACTIC PARIETAL CRANIOTOMY FOR RIGHT FRONTAL TUMOR RESECTION WITH BRAINLAB;  Surgeon: Ashok Pall, MD;  Location: [place];  Service: Neurosurgery;  Laterality: Right;    There were no vitals filed for this visit.      Subjective Assessment - 01/19/17 1348    Subjective  S: They cut the dosage of my steriods in half.    Currently in Pain? 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     Precautions   Precautions Other (comment)   Precaution Comments brain tumor, hx craniotomy, on active radiation                   OT  Treatments/Exercises (OP) - 01/19/17 1349      Exercises   Exercises Shoulder;Hand     Shoulder Exercises: Seated   Protraction Theraband;10 reps   Theraband Level (Shoulder Protraction) Level 2 (Red)   Horizontal ABduction Theraband;10 reps   Theraband Level (Shoulder Horizontal ABduction) Level 2 (Red)   External Rotation Theraband;10 reps   Theraband Level (Shoulder External Rotation) Level 2 (Red)   Internal Rotation Theraband;10 reps   Theraband Level (Shoulder Internal Rotation) Level 2 (Red)   Flexion Theraband;10 reps   Theraband Level (Shoulder Flexion) Level 2 (Red)   Abduction Theraband;10 reps   Theraband Level (Shoulder ABduction) Level 2 (Red)     Hand Exercises   Theraputty Locate Pegs   Theraputty - Locate Pegs red - 7 beads located                  OT Short Term Goals - 01/07/17 1513      OT SHORT TERM GOAL #1   Title Patient will be educated and independent with an HEP for improved functional use of LUE with all daily tasks.    Time 6   Period Weeks   Status On-going     OT SHORT TERM GOAL #2   Title Patient will return to  prior level of function, using her left upper extremity actively with all tasks.    Time 6   Period Weeks   Status On-going     OT SHORT TERM GOAL #3   Title Patient will improve left shoulder A/ROM to WNL for improved ability to reach into overhead cabinets.    Time 6   Period Weeks   Status On-going     OT SHORT TERM GOAL #4   Title Patient will improve left upper extremity strength to 4+/5 for improved ability to lift shopping bags.    Time 6   Period Weeks   Status On-going     OT SHORT TERM GOAL #5   Title Patient will improve left grip strength by 10 pounds and pinch strength by 6 pounds or more for improved ability to open containers and maintain grip on objects.     Time 6   Period Weeks   Status On-going     OT SHORT TERM GOAL #6   Title Patient will improve fine motor coordination needed to fasten  jewelery by decreasing completion time on nine hole peg test by 10 seconds.    Time 6   Period Weeks   Status On-going     OT SHORT TERM GOAL #7   Title Patient will decrease non purposeful tremors from moderate to minimal for improved abilty to complete daily tasks safely.    Time 6   Period Weeks   Status On-going                  Plan - 01/19/17 1614    Clinical Impression Statement A: Focused on shoulder strength and stability this session. patient reports that they have decreased her steriod dosage in half although she continues to notice the tremors.    Plan P: Mini reassess      Patient will benefit from skilled therapeutic intervention in order to improve the following deficits and impairments:  Decreased coordination, Decreased range of motion, Decreased strength, Impaired UE functional use  Visit Diagnosis: Other lack of coordination  Muscle weakness (generalized)    Problem List Patient Active Problem List   Diagnosis Date Noted  . Oral candida 01/08/2017  . Goals of care, counseling/discussion   . Glioblastoma multiforme of frontal lobe ([place]) 12/01/2016  . Brain tumor ([place]) 11/28/2016  . 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 Left-sided weakness 11/20/2016  . 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 HYPERCHOLESTEROLEMIA, BORDERLINE 07/28/2008  . ANEMIA, MILD 07/28/2008  . DIVERTICULOSIS OF COLON 07/28/2008  . HEMATURIA, HX OF 07/28/2008  . COLONIC POLYPS 07/27/2008  . RIGHT BUNDLE BRANCH BLOCK 07/27/2008  . PREMATURE ATRIAL CONTRACTIONS 07/27/2008  . HEMORRHOIDS 07/27/2008  . VENOUS INSUFFICIENCY 07/27/2008  . BACK PAIN, LUMBAR 07/27/2008  . SCOLIOSIS 07/27/2008  . PALPITATIONS, HX OF 07/27/2008   [name], OTR/L,CBIS  [phone]  01/19/2017, 4:16 PM  [facility] [address] [place], [place], [address] Phone: [phone]   Fax:   [phone]  Name: Donna Valentine MRN: [id number] Date of Birth: Aug 17, 1941

## 2017-01-19 NOTE — Telephone Encounter (Signed)
Talked with pt -she agreed to reschedule the 11th to the 13th for both OT/PT treatments. LE will not be her on the 11th.

## 2017-01-19 NOTE — Therapy (Signed)
[place], [place], [address] Phone: [phone]   Fax:  [phone]  Physical Therapy Treatment (Re-Assessment)  Patient Details  Name: Donna Valentine MRN: [id number] Date of Birth: [birth year] Referring Provider: Ashok Pall   Encounter Date: 01/19/2017      PT End of Session - 01/19/17 1340    Visit Number 6   Number of Visits 14   Date for PT Re-Evaluation 02/04/17   Authorization Type UHC Medicare (G-codes and KX); G-codes done 6th session    Authorization Time Period 6/96/29 to 03/09/40; recert done 4/9   Authorization - Visit Number 6   Authorization - Number of Visits 10   PT Start Time 1301   PT Stop Time 3244  ended on Nustep per patietn request, not included in billing    PT Time Calculation (min) 32 min   Equipment Utilized During Treatment Gait belt   Activity Tolerance Patient tolerated treatment well   Behavior During Therapy [facility] for tasks assessed/performed      Past Medical History:  Diagnosis Date  . Brain cancer ([place])    [facility]M  . Cancer [facility])     Past Surgical History:  Procedure Laterality Date  . APPENDECTOMY  1994  . APPLICATION OF CRANIAL NAVIGATION N/A 11/28/2016   Procedure: APPLICATION OF CRANIAL NAVIGATION;  Surgeon: Ashok Pall, MD;  Location: [place];  Service: Neurosurgery;  Laterality: N/A;  . CESAREAN SECTION     x3  . CRANIOTOMY Right 11/28/2016   Procedure: STERIOTACTIC PARIETAL CRANIOTOMY FOR RIGHT FRONTAL TUMOR RESECTION WITH BRAINLAB;  Surgeon: Ashok Pall, MD;  Location: [place];  Service: Neurosurgery;  Laterality: Right;    There were no vitals filed for this visit.      Subjective Assessment - 01/19/17 1303    Subjective Patient arrives today stating that her walking and balance are feeling better with skilled PT services; she is now going to church, helping to prepare meals, wash clothes, etc. She has a hard time sleeping. She still has a litlte bit of  trouble with stairs as they have no railing. She is still concerned about the tremors in her hands. She rates herself as being a B- right now.    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 No/denies            [facility] PT Assessment - 01/19/17 0001      Assessment   Medical Diagnosis glioblastoma    Referring Provider Ashok Pall    Onset Date/Surgical Date 11/20/16   Next MD Visit Dr. Christella Noa after finishing radiation    Prior Therapy she did get some therapy at [facility]      Precautions   Precautions Other (comment)   Precaution Comments brain tumor, hx craniotomy, on active radiation      Balance Screen   Has the patient fallen in the past 6 months No   Has the patient had a decrease in activity level because of a fear of falling?  No   Is the patient reluctant to leave their home because of a fear of falling?  No     Prior Function   Level of Independence Independent   Vocation Retired     Strength   Right Hip Flexion 4/5   Right Hip Extension 3/5   Right Hip ABduction 4/5   Left Hip Flexion  4-/5   Left Hip Extension 3-/5   Left Hip ABduction 4+/5   Right Knee Flexion 4+/5   Right Knee Extension 5/5   Left Knee Flexion 4+/5   Left Knee Extension 5/5   Right Ankle Dorsiflexion 5/5   Left Ankle Dorsiflexion 5/5     6 minute walk test results    Aerobic Endurance Distance Walked 743   Endurance additional comments 3MWT      Dynamic Gait Index   Level Surface Normal   Change in Gait Speed Normal   Gait with Horizontal Head Turns Normal   Gait with Vertical Head Turns Normal   Gait and Pivot Turn Normal   Step Over Obstacle Normal   Step Around Obstacles Normal   Steps Mild Impairment   Total Score 23                     [facility] Adult PT Treatment/Exercise - 01/19/17 0001      Knee/Hip Exercises: Aerobic   Nustep Nustep hills  L2 x 8 min due to time restraint prior OT session (not included in billing)  per patient request, not included in billing                 PT Education - 01/19/17 1340    Education provided Yes   Education Details POC moving forward, progress thus far    Person(s) Educated Patient   Methods Explanation   Comprehension Verbalized understanding          PT Short Term Goals - 01/19/17 1326      PT SHORT TERM GOAL #1   Title Patient to be able to identify 5/5 safety factors/concepts in order to demonstrate good safety and reduced fall risk    Baseline 4/9- 4/5, required small cues for last one    Time 2   Period Weeks   Status Partially Met     PT SHORT TERM GOAL #2   Title Patient to be able to ambulate over 754f in 3MWT in order to demonstrate improved general mobility and community access    Baseline 4/9- 743; very close    Time 2   Period Weeks   Status Partially Met     PT SHORT TERM GOAL #3   Title Patient to be independent in correctly and consistently performing targeted HEP, to be updated as appropriate    Baseline 4/9- tries to do them but only able to do 2x/week    Time 1   Period Weeks   Status On-going           PT Long Term Goals - 01/19/17 1329      PT LONG TERM GOAL #1   Title Patient to demonstrate functional strength 5/5 in all tested muscle groups in order to improve overall gait and balance    Time 4   Period Weeks   Status Partially Met     PT LONG TERM GOAL #2   Title Patient to score 24 on DGI in order to show improved dynamic balance skills and overall reduced fall risk    Baseline 4/9- 23/24    Time 4   Period Weeks   Status Partially Met     PT LONG TERM GOAL #3   Title Patient to be participatory in regular aerobic exercise program, at least 20 minutes in duration and 4 days per week, in order to maintain functional gains and imrpove overall health status    Time 4  Period Weeks   Status On-going                Plan - 02/15/2017 1341    Clinical Impression Statement Re-assessment performed today. Patient appears to be making excellent progress with skilled PT services, and shows improvement  in all objective measures taken today. She does report that she has begun to notice effects of radiation, including some general malaise, but states that exercise in PT has been helping her to feel better thus far. Recommend continuation of skilled PT services to continue addressing advanced strength, advanced dynamic balance, and to improve stair navigation as well as to monitor patient for possible further skilled PT needs moving forward as she continues with her radiation treatments.    Rehab Potential Good   Clinical Impairments Affecting Rehab Potential (+) high PLOF, very active at baseline, good recovery after craniotomy; (-) may be starting radiation/chemo soon/effects of these treatments on tolerance to rehab    PT Frequency 2x / week   PT Duration 4 weeks   PT Treatment/Interventions ADLs/Self Care Home Management;Biofeedback;Gait training;Stair training;Functional mobility training;Therapeutic activities;Therapeutic exercise;Balance training;Neuromuscular re-education;Patient/family education;Manual techniques;Energy conservation;Taping   PT Next Visit Plan dynamic balance activity; stepping activity for balance.  Stair training with least HHA for balance and functional strengthening.     PT Home Exercise Plan Eval: sit to stand no UEs, seated clams with red and green TB, SLS, tandem stance    Consulted and Agree with Plan of Care Patient      Patient will benefit from skilled therapeutic intervention in order to improve the following deficits and impairments:  Abnormal gait, Decreased coordination, Decreased mobility, Decreased activity tolerance, Decreased strength, Decreased balance, Difficulty walking  Visit Diagnosis: Other lack of coordination - Plan: PT plan of care cert/re-cert  Muscle weakness  (generalized) - Plan: PT plan of care cert/re-cert  Unsteadiness on feet - Plan: PT plan of care cert/re-cert       G-Codes - 02/15/2017 1342    Functional Assessment Tool Used (Outpatient Only) Based on skilled clincial assessment of strenght, gait, dynamic balance, overall fall risk    Functional Limitation Mobility: Walking and moving around   Mobility: Walking and Moving Around Current Status (K9179) At least 1 percent but less than 20 percent impaired, limited or restricted   Mobility: Walking and Moving Around Goal Status (X5056) 0 percent impaired, limited or restricted      Problem List Patient Active Problem List   Diagnosis Date Noted  . Oral candida 01/08/2017  . Goals of care, counseling/discussion   . Glioblastoma multiforme of frontal lobe ([place]) 12/01/2016  . Brain tumor ([place]) 11/28/2016  . 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 Left-sided weakness 11/20/2016  . 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 HYPERCHOLESTEROLEMIA, BORDERLINE 07/28/2008  . ANEMIA, MILD 07/28/2008  . DIVERTICULOSIS OF COLON 07/28/2008  . HEMATURIA, HX OF 07/28/2008  . COLONIC POLYPS 07/27/2008  . RIGHT BUNDLE BRANCH BLOCK 07/27/2008  . PREMATURE ATRIAL CONTRACTIONS 07/27/2008  . HEMORRHOIDS 07/27/2008  . VENOUS INSUFFICIENCY 07/27/2008  . BACK PAIN, LUMBAR 07/27/2008  . SCOLIOSIS 07/27/2008  . PALPITATIONS, HX OF 07/27/2008    Deniece Ree PT, DPT [facility] [address] [place], [place], [address] Phone: [phone]   Fax:  [phone]  Name: Nakeisha Greenhouse MRN: [id number] Date of Birth: March 20, 1941

## 2017-01-20 ENCOUNTER — Ambulatory Visit: Payer: Medicare Other

## 2017-01-20 ENCOUNTER — Ambulatory Visit
Admission: RE | Admit: 2017-01-20 | Discharge: 2017-01-20 | Disposition: A | Discharge status: Home / Self Care | Payer: Medicare Other | Source: Ambulatory Visit | Attending: Radiation Oncology | Admitting: Radiation Oncology

## 2017-01-20 DIAGNOSIS — Z510 Encounter for antineoplastic radiation therapy: Secondary | ICD-10-CM | POA: Diagnosis not present

## 2017-01-21 ENCOUNTER — Ambulatory Visit: Payer: Medicare Other

## 2017-01-21 ENCOUNTER — Ambulatory Visit (HOSPITAL_COMMUNITY): Payer: Medicare Other

## 2017-01-21 ENCOUNTER — Ambulatory Visit
Admission: RE | Admit: 2017-01-21 | Discharge: 2017-01-21 | Disposition: A | Discharge status: Home / Self Care | Payer: Medicare Other | Source: Ambulatory Visit | Attending: Radiation Oncology | Admitting: Radiation Oncology

## 2017-01-21 DIAGNOSIS — Z510 Encounter for antineoplastic radiation therapy: Secondary | ICD-10-CM | POA: Diagnosis not present

## 2017-01-22 ENCOUNTER — Other Ambulatory Visit: Payer: Self-pay | Admitting: Radiation Therapy

## 2017-01-22 ENCOUNTER — Ambulatory Visit
Admission: RE | Admit: 2017-01-22 | Discharge: 2017-01-22 | Disposition: A | Discharge status: Home / Self Care | Payer: Medicare Other | Source: Ambulatory Visit | Attending: Radiation Oncology | Admitting: Radiation Oncology

## 2017-01-22 DIAGNOSIS — C711 Malignant neoplasm of frontal lobe: Secondary | ICD-10-CM

## 2017-01-22 DIAGNOSIS — Z510 Encounter for antineoplastic radiation therapy: Secondary | ICD-10-CM | POA: Diagnosis not present

## 2017-01-23 ENCOUNTER — Encounter (HOSPITAL_COMMUNITY): Payer: Self-pay | Admitting: Occupational Therapy

## 2017-01-23 ENCOUNTER — Ambulatory Visit (HOSPITAL_COMMUNITY): Payer: Medicare Other

## 2017-01-23 ENCOUNTER — Ambulatory Visit
Admission: RE | Admit: 2017-01-23 | Discharge: 2017-01-23 | Disposition: A | Discharge status: Home / Self Care | Payer: Medicare Other | Source: Ambulatory Visit | Attending: Radiation Oncology | Admitting: Radiation Oncology

## 2017-01-23 ENCOUNTER — Ambulatory Visit (HOSPITAL_COMMUNITY): Payer: Medicare Other | Admitting: Occupational Therapy

## 2017-01-23 DIAGNOSIS — M6281 Muscle weakness (generalized): Secondary | ICD-10-CM

## 2017-01-23 DIAGNOSIS — R278 Other lack of coordination: Secondary | ICD-10-CM

## 2017-01-23 DIAGNOSIS — Z510 Encounter for antineoplastic radiation therapy: Secondary | ICD-10-CM | POA: Diagnosis not present

## 2017-01-23 DIAGNOSIS — R2681 Unsteadiness on feet: Secondary | ICD-10-CM

## 2017-01-23 NOTE — Therapy (Signed)
[place], [place], [address] Phone: [phone]   Fax:  [phone]  Physical Therapy Treatment  Patient Details  Name: Donna Valentine MRN: [id number] Date of Birth: [birth year] Referring Provider: Ashok Pall   Encounter Date: 01/23/2017      PT End of Session - 01/23/17 1355    Visit Number 7   Number of Visits 14   Date for PT Re-Evaluation 02/04/17   Authorization Type [employer] Medicare (G-codes and KX); G-codes done 6th session    Authorization Time Period 8/52/77 to 06/05/22; recert done 4/9   Authorization - Visit Number 7   Authorization - Number of Visits 10   PT Start Time 5361   PT Stop Time 1425   PT Time Calculation (min) 40 min   Activity Tolerance Patient tolerated treatment well   Behavior During Therapy [facility] for tasks assessed/performed      Past Medical History:  Diagnosis Date  . Brain cancer ([place])    GBM  . Cancer [facility])     Past Surgical History:  Procedure Laterality Date  . APPENDECTOMY  1994  . APPLICATION OF CRANIAL NAVIGATION N/A 11/28/2016   Procedure: APPLICATION OF CRANIAL NAVIGATION;  Surgeon: Ashok Pall, MD;  Location: [place];  Service: Neurosurgery;  Laterality: N/A;  . CESAREAN SECTION     x3  . CRANIOTOMY Right 11/28/2016   Procedure: STERIOTACTIC PARIETAL CRANIOTOMY FOR RIGHT FRONTAL TUMOR RESECTION WITH BRAINLAB;  Surgeon: Ashok Pall, MD;  Location: [place];  Service: Neurosurgery;  Laterality: Right;    There were no vitals filed for this visit.      Subjective Assessment - 01/23/17 1350    Subjective Pt reports she is doing fine today. She comes in a couple hours after radiaton therapy and immediately folowng OT session. She denies pain at this time, and reports feeling well. HEP has been hit or miss, as she trie sot focus on conservation of energy.    Pertinent History glioblastoma of R frontal lobe, craniotomy done on 11/28/16   Patient Stated Goals improve  strength, enhance independence    Currently in Pain? No/denies                         [facility] Adult PT Treatment/Exercise - 01/23/17 1352      Knee/Hip Exercises: Aerobic   Nustep NuSTEP HIIT: 36mn WU level 2 ; 3 intervals max effort 30sec, level 5, wtih 2 minutes between.     Knee/Hip Exercises: Standing   Lateral Step Up Both;2 sets;Step Height: 4";Step Height: 2";Step Height: 6";Hand Hold: 2  airex + step: 1x10-4";1x10-6": handhold for left    Forward Step Up Both;2 sets;10 reps;Step Height: 8";Hand Hold: 2  with contrlateral hip thrust and balance             Balance Exercises - 01/23/17 1423      Balance Exercises: Standing   SLS Eyes open  with cone taps 1x12 bilat             PT Short Term Goals - 01/19/17 1326      PT SHORT TERM GOAL #1   Title Patient to be able to identify 5/5 safety factors/concepts in order to demonstrate good safety and reduced fall risk    Baseline 4/9- 4/5, required small cues for last one    Time 2   Period Weeks   Status Partially Met     PT SHORT TERM GOAL #  2   Title Patient to be able to ambulate over 781f in 3MWT in order to demonstrate improved general mobility and community access    Baseline 4/9- 743; very close    Time 2   Period Weeks   Status Partially Met     PT SHORT TERM GOAL #3   Title Patient to be independent in correctly and consistently performing targeted HEP, to be updated as appropriate    Baseline 4/9- tries to do them but only able to do 2x/week    Time 1   Period Weeks   Status On-going           PT Long Term Goals - 01/19/17 1329      PT LONG TERM GOAL #1   Title Patient to demonstrate functional strength 5/5 in all tested muscle groups in order to improve overall gait and balance    Time 4   Period Weeks   Status Partially Met     PT LONG TERM GOAL #2   Title Patient to score 24 on DGI in order to show improved dynamic balance skills and overall reduced fall risk     Baseline 4/9- 23/24    Time 4   Period Weeks   Status Partially Met     PT LONG TERM GOAL #3   Title Patient to be participatory in regular aerobic exercise program, at least 20 minutes in duration and 4 days per week, in order to maintain functional gains and imrpove overall health status    Time 4   Period Weeks   Status On-going               Plan - 01/23/17 1356    Clinical Impression Statement Pt tolerating session well today. Pt state a preference for starting out with NUSTEP this session and PT transisiitoned to heavy emphasis on high intensity interval training because of its evidence based benefits for patients undergoing CA treatments. Pt tolerates HIIT well, no detailed feedback given. Moved to progress dynamic balance activities, thereafter progressing as able.  Pt making progress toward goals overall.  Pt does shoe some signs of weakness on the Right side as stepping is progressed, but is able to complete sets with soem hand held assist.    Rehab Potential Good   Clinical Impairments Affecting Rehab Potential (+) high PLOF, very active at baseline, good recovery after craniotomy; (-) may be starting radiation/chemo soon/effects of these treatments on tolerance to rehab    PT Frequency 2x / week   PT Duration 4 weeks   PT Treatment/Interventions ADLs/Self Care Home Management;Biofeedback;Gait training;Stair training;Functional mobility training;Therapeutic activities;Therapeutic exercise;Balance training;Neuromuscular re-education;Patient/family education;Manual techniques;Energy conservation;Taping   PT Next Visit Plan dynamic balance activity; continue with HIIT on Nustep, stepping activity for balance with least HHA for balance and functional strengthening.     PT Home Exercise Plan Eval: sit to stand no UEs, seated clams with red and green TB, SLS, tandem stance    Consulted and Agree with Plan of Care Patient      Patient will benefit from skilled therapeutic  intervention in order to improve the following deficits and impairments:  Abnormal gait, Decreased coordination, Decreased mobility, Decreased activity tolerance, Decreased strength, Decreased balance, Difficulty walking  Visit Diagnosis: Other lack of coordination  Muscle weakness (generalized)  Unsteadiness on feet     Problem List Patient Active Problem List   Diagnosis Date Noted  . Oral candida 01/08/2017  . Goals of care, counseling/discussion   .  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 Brain tumor ([place]) 11/28/2016  . 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 Left-sided weakness 11/20/2016  . 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 HYPERCHOLESTEROLEMIA, BORDERLINE 07/28/2008  . ANEMIA, MILD 07/28/2008  . DIVERTICULOSIS OF COLON 07/28/2008  . HEMATURIA, HX OF 07/28/2008  . COLONIC POLYPS 07/27/2008  . RIGHT BUNDLE BRANCH BLOCK 07/27/2008  . PREMATURE ATRIAL CONTRACTIONS 07/27/2008  . HEMORRHOIDS 07/27/2008  . VENOUS INSUFFICIENCY 07/27/2008  . BACK PAIN, LUMBAR 07/27/2008  . SCOLIOSIS 07/27/2008  . PALPITATIONS, HX OF 07/27/2008    2:27 PM, 01/23/17 Etta Grandchild, PT, DPT Physical Therapist - [place] [phone] [facility])  [phone] (mobile)    Etta Grandchild 01/23/2017, 2:27 PM  [place] [address] [place], [place], [address] Phone: [phone]   Fax:  [phone]  Name: Mylei Brackeen MRN: [id number] Date of Birth: [birth year]

## 2017-01-23 NOTE — Therapy (Addendum)
[place], [place], [address] Phone: [phone]   Fax:  [phone]  Occupational Therapy Treatment  Patient Details  Name: Donna Valentine MRN: [id number] Date of Birth: 07-Sep-1941 Referring Provider: Dr. Ashok Pall  Encounter Date: 01/23/2017      OT End of Session - 01/23/17 1355    Visit Number 6   Number of Visits 12   Date for OT Re-Evaluation 02/05/17   Authorization Type U[employer] Medicare   Authorization Time Period before 16th visit   Authorization - Visit Number 6   Authorization - Number of Visits 16   OT Start Time 5102  pt in restroom after arrival   OT Stop Time 1345   OT Time Calculation (min) 38 min   Activity Tolerance Patient tolerated treatment well   Behavior During Therapy [facility] for tasks assessed/performed      Past Medical History:  Diagnosis Date  . Brain cancer ([place])    GBM  . Cancer [facility])     Past Surgical History:  Procedure Laterality Date  . APPENDECTOMY  1994  . APPLICATION OF CRANIAL NAVIGATION N/A 11/28/2016   Procedure: APPLICATION OF CRANIAL NAVIGATION;  Surgeon: Ashok Pall, MD;  Location: [place];  Service: Neurosurgery;  Laterality: N/A;  . CESAREAN SECTION     x3  . CRANIOTOMY Right 11/28/2016   Procedure: STERIOTACTIC PARIETAL CRANIOTOMY FOR RIGHT FRONTAL TUMOR RESECTION WITH BRAINLAB;  Surgeon: Ashok Pall, MD;  Location: [place];  Service: Neurosurgery;  Laterality: Right;    There were no vitals filed for this visit.      Subjective Assessment - 01/23/17 1301    Subjective  S: I feel a little weak today, I had radiation this morning.    Currently in Pain? No/denies            [facility] OT Assessment - 01/23/17 1307      Assessment   Diagnosis LUE Weakness S/P removal of right frontal lobe glioblastoma multiforme     Precautions   Precautions Other (comment)   Precaution Comments brain tumor, hx craniotomy, on active radiation      Coordination   Left 9 Hole Peg Test 36.28"  39.48" previous     AROM   Left Shoulder Flexion 144 Degrees  130 previous   Left Shoulder ABduction 132 Degrees  125 previous   Left Shoulder Internal Rotation 70 Degrees  same as previous   Left Shoulder External Rotation 72 Degrees  70 previous     Strength   Strength Assessment Site Hand;Shoulder;Elbow;Wrist   Left Shoulder Flexion 4+/5  4/5 previous   Left Shoulder ABduction 4/5  same as previous   Left Shoulder Internal Rotation 4+/5  4/5 previous   Left Shoulder External Rotation 4/5  same as previous   Left Elbow Flexion 4-/5  same as previous   Left Elbow Extension 4/5  4-/5 previous   Left Wrist Flexion 5/5  same as previous   Left Wrist Extension 5/5  same as previous   Right/Left hand Left   Left Hand Grip (lbs) 40  35 previous   Left Hand Lateral Pinch 9 lbs  same as previous   Left Hand 3 Point Pinch 11 lbs  9 previous                  OT Treatments/Exercises (OP) - 01/23/17 1321      Exercises   Exercises Hand;Wrist     Additional Wrist Exercises  Sponges Pt used red clothespin to grasp high resistance sponges using lateral and 3 point pinch, 20X each pinch. During 3 point pinch pt used clothespin in right hand to stabilize sponge due to tremors.    Hand Gripper with Large Beads all beads with gripper set at 35#   Hand Gripper with Medium Beads 4 beads with gripper set at 35, remaining 8 at 29#                  OT Short Term Goals - 02/22/17 1356      OT SHORT TERM GOAL #1   Title Patient will be educated and independent with an HEP for improved functional use of LUE with all daily tasks.    Time 6   Period Weeks   Status On-going     OT SHORT TERM GOAL #2   Title Patient will return to prior level of function, using her left upper extremity actively with all tasks.    Time 6   Period Weeks   Status On-going     OT SHORT TERM GOAL #3   Title Patient will improve left shoulder A/ROM to  WNL for improved ability to reach into overhead cabinets.    Time 6   Period Weeks   Status On-going     OT SHORT TERM GOAL #4   Title Patient will improve left upper extremity strength to 4+/5 for improved ability to lift shopping bags.    Time 6   Period Weeks   Status Partially Met     OT SHORT TERM GOAL #5   Title Patient will improve left grip strength by 10 pounds and pinch strength by 6 pounds or more for improved ability to open containers and maintain grip on objects.     Time 6   Period Weeks   Status On-going     OT SHORT TERM GOAL #6   Title Patient will improve fine motor coordination needed to fasten jewelery by decreasing completion time on nine hole peg test by 10 seconds.    Time 6   Period Weeks   Status On-going     OT SHORT TERM GOAL #7   Title Patient will decrease non purposeful tremors from moderate to minimal for improved abilty to complete daily tasks safely.    Time 6   Period Weeks   Status On-going                  Plan - Feb 22, 2017 1356    Clinical Impression Statement A: Pt reports feeling weak upon arrival, mini-reassessment completed and session focused on grip and pinch strengthening. Pt has partially met 1 STG and is progressing towards ROM, strength, and coordination goals. Pt reports she is continuing to experiencing temors, however is still weaning from her steroid medications.    Plan P: Continue with UB strengthening if pt able to tolerate   Consulted and Agree with Plan of Care Patient      Patient will benefit from skilled therapeutic intervention in order to improve the following deficits and impairments:  Decreased coordination, Decreased range of motion, Decreased strength, Impaired UE functional use  Visit Diagnosis: Other lack of coordination  Muscle weakness (generalized)      G-Codes - 2017-02-22 1408    Functional Assessment Tool Used (Outpatient only) clinical judgement/observation   Functional Limitation  Carrying, moving and handling objects   Carrying, Moving and Handling Objects Current Status (J7530) At least 20 percent but less than 40 percent  impaired, limited or restricted   Carrying, Moving and Handling Objects Goal Status [id number]) At least 1 percent but less than 20 percent impaired, limited or restricted      Problem List Patient Active Problem List   Diagnosis Date Noted  . Oral candida 01/08/2017  . Goals of care, counseling/discussion   . 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 Brain tumor ([place]) 11/28/2016  . 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 Left-sided weakness 11/20/2016  . 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 HYPERCHOLESTEROLEMIA, BORDERLINE 07/28/2008  . ANEMIA, MILD 07/28/2008  . DIVERTICULOSIS OF COLON 07/28/2008  . HEMATURIA, HX OF 07/28/2008  . COLONIC POLYPS 07/27/2008  . RIGHT BUNDLE BRANCH BLOCK 07/27/2008  . PREMATURE ATRIAL CONTRACTIONS 07/27/2008  . HEMORRHOIDS 07/27/2008  . VENOUS INSUFFICIENCY 07/27/2008  . BACK PAIN, LUMBAR 07/27/2008  . SCOLIOSIS 07/27/2008  . PALPITATIONS, HX OF 07/27/2008   Guadelupe Sabin, OTR/L  [phone] 01/23/2017, 2:10 PM  [place] [address] [place], [place], [address] Phone: [phone]   Fax:  [phone]  Name: Donna Valentine MRN: [id number] Date of Birth: April 24, 1941

## 2017-01-26 ENCOUNTER — Other Ambulatory Visit: Payer: Self-pay | Admitting: Radiation Oncology

## 2017-01-26 ENCOUNTER — Telehealth (HOSPITAL_COMMUNITY): Payer: Self-pay | Admitting: Occupational Therapy

## 2017-01-26 ENCOUNTER — Ambulatory Visit (HOSPITAL_COMMUNITY): Payer: Medicare Other

## 2017-01-26 ENCOUNTER — Ambulatory Visit
Admission: RE | Admit: 2017-01-26 | Discharge: 2017-01-26 | Disposition: A | Discharge status: Home / Self Care | Payer: Medicare Other | Source: Ambulatory Visit | Attending: Radiation Oncology | Admitting: Radiation Oncology

## 2017-01-26 ENCOUNTER — Telehealth: Payer: Self-pay | Admitting: Radiation Oncology

## 2017-01-26 ENCOUNTER — Encounter (HOSPITAL_COMMUNITY): Payer: Self-pay

## 2017-01-26 ENCOUNTER — Ambulatory Visit (HOSPITAL_COMMUNITY): Payer: Medicare Other | Admitting: Physical Therapy

## 2017-01-26 DIAGNOSIS — R278 Other lack of coordination: Secondary | ICD-10-CM | POA: Diagnosis not present

## 2017-01-26 DIAGNOSIS — C711 Malignant neoplasm of frontal lobe: Secondary | ICD-10-CM

## 2017-01-26 DIAGNOSIS — M6281 Muscle weakness (generalized): Secondary | ICD-10-CM

## 2017-01-26 DIAGNOSIS — R2681 Unsteadiness on feet: Secondary | ICD-10-CM

## 2017-01-26 DIAGNOSIS — Z510 Encounter for antineoplastic radiation therapy: Secondary | ICD-10-CM | POA: Diagnosis not present

## 2017-01-26 MED ORDER — LEVETIRACETAM 500 MG PO TABS: 500.0000 mg | ORAL_TABLET | Freq: Two times a day (BID) | ORAL | 5 refills | Status: DC

## 2017-01-26 NOTE — Therapy (Signed)
[place], [place], [address] Phone: [phone]   Fax:  [phone]  Physical Therapy Treatment  Patient Details  Name: Donna Valentine MRN: [id number] Date of Birth: July 29, 1941 Referring Provider: Ashok Pall   Encounter Date: 01/26/2017      PT End of Session - 01/26/17 1429    Visit Number 8   Number of Visits 14   Date for PT Re-Evaluation 02/04/17   Authorization Type [employer] Medicare (G-codes and KX); G-codes done 6th session    Authorization Time Period 05/24/74 to 1/70/01; recert done 4/9   Authorization - Visit Number 8   Authorization - Number of Visits 10   PT Start Time 7494  started few minutes late/Nustep    PT Stop Time 1428   PT Time Calculation (min) 30 min   Equipment Utilized During Treatment Gait belt   Activity Tolerance Patient tolerated treatment well   Behavior During Therapy [facility] for tasks assessed/performed      Past Medical History:  Diagnosis Date  . Brain cancer ([place])    GBM  . Cancer [facility])     Past Surgical History:  Procedure Laterality Date  . APPENDECTOMY  1994  . APPLICATION OF CRANIAL NAVIGATION N/A 11/28/2016   Procedure: APPLICATION OF CRANIAL NAVIGATION;  Surgeon: Ashok Pall, MD;  Location: [place];  Service: Neurosurgery;  Laterality: N/A;  . CESAREAN SECTION     x3  . CRANIOTOMY Right 11/28/2016   Procedure: STERIOTACTIC PARIETAL CRANIOTOMY FOR RIGHT FRONTAL TUMOR RESECTION WITH BRAINLAB;  Surgeon: Ashok Pall, MD;  Location: [place];  Service: Neurosurgery;  Laterality: Right;    There were no vitals filed for this visit.      Subjective Assessment - 01/26/17 1354    Subjective Patient arrives doing well today, no major complaints and feeling well overall    Pertinent History glioblastoma of R frontal lobe, craniotomy done on 11/28/16   Patient Stated Goals improve strength, enhance independence    Currently in Pain? No/denies                          [facility] Adult PT Treatment/Exercise - 01/26/17 0001      Knee/Hip Exercises: Aerobic   Nustep Nustep hills 4 level 4 x8 minutes   not included in billing      Knee/Hip Exercises: Standing   Forward Lunges Both;1 set;10 reps   Forward Lunges Limitations BOSU    Lateral Step Up Both;1 set;15 reps   Lateral Step Up Limitations BOSU light B HHA    Forward Step Up Both;1 set;15 reps   Forward Step Up Limitations BOSU light B HHA              Balance Exercises - 01/26/17 1409      Balance Exercises: Standing   Standing Eyes Opened Narrow base of support (BOS);4 reps;30 secs;Other (comment)  static stance on BOSU, min guard    Marching Limitations 2x10 on BOSU    Other Standing Exercises gait forwards and sideways over largea nd small hurdles 2x6           PT Education - 01/26/17 1429    Education provided No          PT Short Term Goals - 01/19/17 1326      PT SHORT TERM GOAL #1   Title Patient to be able to identify 5/5 safety factors/concepts in order to demonstrate good safety and reduced  fall risk    Baseline 4/9- 4/5, required small cues for last one    Time 2   Period Weeks   Status Partially Met     PT SHORT TERM GOAL #2   Title Patient to be able to ambulate over 758f in 3MWT in order to demonstrate improved general mobility and community access    Baseline 4/9- 743; very close    Time 2   Period Weeks   Status Partially Met     PT SHORT TERM GOAL #3   Title Patient to be independent in correctly and consistently performing targeted HEP, to be updated as appropriate    Baseline 4/9- tries to do them but only able to do 2x/week    Time 1   Period Weeks   Status On-going           PT Long Term Goals - 01/19/17 1329      PT LONG TERM GOAL #1   Title Patient to demonstrate functional strength 5/5 in all tested muscle groups in order to improve overall gait and balance    Time 4   Period Weeks   Status  Partially Met     PT LONG TERM GOAL #2   Title Patient to score 24 on DGI in order to show improved dynamic balance skills and overall reduced fall risk    Baseline 4/9- 23/24    Time 4   Period Weeks   Status Partially Met     PT LONG TERM GOAL #3   Title Patient to be participatory in regular aerobic exercise program, at least 20 minutes in duration and 4 days per week, in order to maintain functional gains and imrpove overall health status    Time 4   Period Weeks   Status On-going               Plan - 01/26/17 1430    Clinical Impression Statement Continued working on NH[employer] Financialfor facilitation of strength, functional activity tolerance, gait mechanics. Otherwise continued to progress exercise in CKC positions and balance based activities, introducing exercise on BOSU ball this session with good tolerance by patient today. Patient reports she is near the end of her cancer treatments and is quite excited about this. Continued to perform activity as tolerated this session.    Rehab Potential Good   Clinical Impairments Affecting Rehab Potential (+) high PLOF, very active at baseline, good recovery after craniotomy; (-) may be starting radiation/chemo soon/effects of these treatments on tolerance to rehab    PT Frequency 2x / week   PT Duration 4 weeks   PT Treatment/Interventions ADLs/Self Care Home Management;Biofeedback;Gait training;Stair training;Functional mobility training;Therapeutic activities;Therapeutic exercise;Balance training;Neuromuscular re-education;Patient/family education;Manual techniques;Energy conservation;Taping   PT Next Visit Plan dynamic balance activity; continue with HIIT on Nustep, stepping activity for balance with least HHA for balance and functional strengthening.     PT Home Exercise Plan Eval: sit to stand no UEs, seated clams with red and green TB, SLS, tandem stance    Consulted and Agree with Plan of Care Patient      Patient will benefit from  skilled therapeutic intervention in order to improve the following deficits and impairments:  Abnormal gait, Decreased coordination, Decreased mobility, Decreased activity tolerance, Decreased strength, Decreased balance, Difficulty walking  Visit Diagnosis: Other lack of coordination  Muscle weakness (generalized)  Unsteadiness on feet     Problem List Patient Active Problem List   Diagnosis Date Noted  . Oral candida 01/08/2017  .  Goals of care, counseling/discussion   . 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 Brain tumor ([place]) 11/28/2016  . 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 Left-sided weakness 11/20/2016  . 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 HYPERCHOLESTEROLEMIA, BORDERLINE 07/28/2008  . ANEMIA, MILD 07/28/2008  . DIVERTICULOSIS OF COLON 07/28/2008  . HEMATURIA, HX OF 07/28/2008  . COLONIC POLYPS 07/27/2008  . RIGHT BUNDLE BRANCH BLOCK 07/27/2008  . PREMATURE ATRIAL CONTRACTIONS 07/27/2008  . HEMORRHOIDS 07/27/2008  . VENOUS INSUFFICIENCY 07/27/2008  . BACK PAIN, LUMBAR 07/27/2008  . SCOLIOSIS 07/27/2008  . PALPITATIONS, HX OF 07/27/2008    Deniece Ree PT, DPT [place] [address] [place], [place], [address] Phone: [phone]   Fax:  [phone]  Name: Donna Valentine MRN: [id number] Date of Birth: 1940-11-28

## 2017-01-26 NOTE — Therapy (Signed)
[facility] [address] [place], [place], [address] Phone: [phone]   Fax:  [phone]  Occupational Therapy Treatment  Patient Details  Name: Donna Valentine MRN: [id number] Date of Birth: 1941-06-26 Referring Provider: Dr. Coletta Memos  Encounter Date: 01/26/2017      OT End of Session - 01/26/17 1512    Visit Number 7   Number of Visits 12   Date for OT Re-Evaluation 02/05/17   Authorization Type [employer]   Authorization Time Period before 16th visit   Authorization - Visit Number 7   Authorization - Number of Visits 16   OT Start Time 1433   OT Stop Time 1515   OT Time Calculation (min) 42 min   Activity Tolerance Patient tolerated treatment well   Behavior During Therapy [facility] for tasks assessed/performed      Past Medical History:  Diagnosis Date  . Brain cancer (HCC)    GBM  . Cancer [facility])     Past Surgical History:  Procedure Laterality Date  . APPENDECTOMY  1994  . APPLICATION OF CRANIAL NAVIGATION N/A 11/28/2016   Procedure: APPLICATION OF CRANIAL NAVIGATION;  Surgeon: Coletta Memos, MD;  Location: [facility] OR;  Service: Neurosurgery;  Laterality: N/A;  . CESAREAN SECTION     x3  . CRANIOTOMY Right 11/28/2016   Procedure: STERIOTACTIC PARIETAL CRANIOTOMY FOR RIGHT FRONTAL TUMOR RESECTION WITH BRAINLAB;  Surgeon: Coletta Memos, MD;  Location: [facility] OR;  Service: Neurosurgery;  Laterality: Right;    There were no vitals filed for this visit.                    OT Treatments/Exercises (OP) - 01/26/17 1441      Exercises   Exercises Hand;Wrist     Shoulder Exercises: Seated   Protraction Theraband;10 reps   Theraband Level (Shoulder Protraction) Level 3 (Green)   Horizontal ABduction Theraband;10 reps   Theraband Level (Shoulder Horizontal ABduction) Level 3 (Green)   External Rotation Theraband;10 reps   Theraband Level (Shoulder External Rotation) Level 3 (Green)   Internal Rotation  Theraband;10 reps   Theraband Level (Shoulder Internal Rotation) Level 3 (Green)   Flexion Theraband;10 reps   Theraband Level (Shoulder Flexion) Level 3 (Green)   Abduction Theraband;10 reps   Theraband Level (Shoulder ABduction) Level 3 (Green)   Other Seated Exercises Shoulder strengthening completed with small green therapy ball exercises including shoulder flexion, horizontal abduction, woodchops left and right, overhead press, and chest press. 12X     Shoulder Exercises: ROM/Strengthening   UBE (Upper Arm Bike) Level 1 2' reverse 2' forward             Balance Exercises - 01/26/17 1409      Balance Exercises: Standing   Standing Eyes Opened Narrow base of support (BOS);4 reps;30 secs;Other (comment)  static stance on BOSU, min guard    Marching Limitations 2x10 on BOSU    Other Standing Exercises gait forwards and sideways over largea nd small hurdles 2x6             OT Short Term Goals - 01/23/17 1356      OT SHORT TERM GOAL #1   Title Patient will be educated and independent with an HEP for improved functional use of LUE with all daily tasks.    Time 6   Period Weeks   Status On-going     OT SHORT TERM GOAL #2   Title Patient will return to prior level of  function, using her left upper extremity actively with all tasks.    Time 6   Period Weeks   Status On-going     OT SHORT TERM GOAL #3   Title Patient will improve left shoulder A/ROM to WNL for improved ability to reach into overhead cabinets.    Time 6   Period Weeks   Status On-going     OT SHORT TERM GOAL #4   Title Patient will improve left upper extremity strength to 4+/5 for improved ability to lift shopping bags.    Time 6   Period Weeks   Status Partially Met     OT SHORT TERM GOAL #5   Title Patient will improve left grip strength by 10 pounds and pinch strength by 6 pounds or more for improved ability to open containers and maintain grip on objects.     Time 6   Period Weeks   Status  On-going     OT SHORT TERM GOAL #6   Title Patient will improve fine motor coordination needed to fasten jewelery by decreasing completion time on nine hole peg test by 10 seconds.    Time 6   Period Weeks   Status On-going     OT SHORT TERM GOAL #7   Title Patient will decrease non purposeful tremors from moderate to minimal for improved abilty to complete daily tasks safely.    Time 6   Period Weeks   Status On-going                  Plan - 01/26/17 1513    Clinical Impression Statement A: Pt did well with UB strengthening progressing to green band this session. Rest breaks taken as needed and VC for form and technique. Pt reports her last week of radiation is next week and starting in May she will have more energy during therapy sessions.    Plan P: Use measurements from reassessment on 4/13 and send in a re-cert for 4 weeks more weeks to MD. Continue to work on shoulder strengthening and grip and pinch strength as able to tolerate.       Patient will benefit from skilled therapeutic intervention in order to improve the following deficits and impairments:  Decreased coordination, Decreased range of motion, Decreased strength, Impaired UE functional use  Visit Diagnosis: Other lack of coordination    Problem List Patient Active Problem List   Diagnosis Date Noted  . Oral candida 01/08/2017  . Goals of care, counseling/discussion   . 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 Brain tumor ([place]) 11/28/2016  . 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 Left-sided weakness 11/20/2016  . 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 HYPERCHOLESTEROLEMIA, BORDERLINE 07/28/2008  . ANEMIA, MILD 07/28/2008  . DIVERTICULOSIS OF COLON 07/28/2008  . HEMATURIA, HX OF 07/28/2008  . COLONIC POLYPS 07/27/2008  . RIGHT BUNDLE BRANCH BLOCK 07/27/2008  . PREMATURE ATRIAL CONTRACTIONS 07/27/2008  . HEMORRHOIDS 07/27/2008  .  VENOUS INSUFFICIENCY 07/27/2008  . BACK PAIN, LUMBAR 07/27/2008  . SCOLIOSIS 07/27/2008  . PALPITATIONS, HX OF 07/27/2008     [name], OTR/L,CBIS  [phone]  01/26/2017, 3:16 PM  [facility] [address] [place], [place], [address] Phone: [phone]   Fax:  [phone]  Name: Donna Valentine MRN: [id number] Date of Birth: [birth year]

## 2017-01-26 NOTE — Telephone Encounter (Signed)
-----   Message from Tyler Pita, MD sent at 01/26/2017  2:08 PM EDT ----- Regarding: RE: Refill request I sent eRx refill to [facility]  ----- Message ----- From: Heywood Footman, RN Sent: 01/26/2017   1:49 PM To: Tyler Pita, MD, Freeman Caldron, PA-C, # Subject: Refill request                                 Patient phoned today requesting a refill of her Keppra. She is 76 years old with right frontal GBM. She is currently under treatment.  She takes 500 mg bid. It appears [facility] wrote the original script. She uses [facility].   Sam

## 2017-01-26 NOTE — Telephone Encounter (Signed)
Phoned Myriam Jacobson at [facility] to confirm [place]. Myriam Jacobson confirms receipt and that the patient has already picked up the refill.

## 2017-01-26 NOTE — Telephone Encounter (Signed)
Patient have another appt and cant come on 01-28-17

## 2017-01-27 ENCOUNTER — Ambulatory Visit (HOSPITAL_BASED_OUTPATIENT_CLINIC_OR_DEPARTMENT_OTHER): Payer: Medicare Other | Admitting: Hematology

## 2017-01-27 ENCOUNTER — Other Ambulatory Visit (HOSPITAL_BASED_OUTPATIENT_CLINIC_OR_DEPARTMENT_OTHER): Payer: Medicare Other

## 2017-01-27 ENCOUNTER — Encounter: Payer: Self-pay | Admitting: Hematology

## 2017-01-27 ENCOUNTER — Telehealth: Payer: Self-pay | Admitting: Hematology

## 2017-01-27 ENCOUNTER — Ambulatory Visit
Admission: RE | Admit: 2017-01-27 | Discharge: 2017-01-27 | Disposition: A | Discharge status: Home / Self Care | Payer: Medicare Other | Source: Ambulatory Visit | Attending: Radiation Oncology | Admitting: Radiation Oncology

## 2017-01-27 VITALS — BP 133/62 | HR 77 | Temp 98.1°F | Resp 18 | Ht 66.0 in | Wt 191.7 lb

## 2017-01-27 DIAGNOSIS — H00015 Hordeolum externum left lower eyelid: Secondary | ICD-10-CM

## 2017-01-27 DIAGNOSIS — C711 Malignant neoplasm of frontal lobe: Secondary | ICD-10-CM

## 2017-01-27 DIAGNOSIS — C719 Malignant neoplasm of brain, unspecified: Secondary | ICD-10-CM

## 2017-01-27 DIAGNOSIS — Z510 Encounter for antineoplastic radiation therapy: Secondary | ICD-10-CM | POA: Diagnosis not present

## 2017-01-27 LAB — COMPREHENSIVE METABOLIC PANEL
ALT: 34 U/L (ref 0–55)
AST: 22 U/L (ref 5–34)
Albumin: 3.4 g/dL — ABNORMAL LOW (ref 3.5–5.0)
Alkaline Phosphatase: 60 U/L (ref 40–150)
Anion Gap: 10 mEq/L (ref 3–11)
BILIRUBIN TOTAL: 0.53 mg/dL (ref 0.20–1.20)
BUN: 19.7 mg/dL (ref 7.0–26.0)
CHLORIDE: 108 meq/L (ref 98–109)
CO2: 25 meq/L (ref 22–29)
CREATININE: 1 mg/dL (ref 0.6–1.1)
Calcium: 8.9 mg/dL (ref 8.4–10.4)
EGFR: 58 mL/min/{1.73_m2} — ABNORMAL LOW (ref >90)
Glucose: 136 mg/dl (ref 70–140)
Potassium: 4.6 mEq/L (ref 3.5–5.1)
Sodium: 144 mEq/L (ref 136–145)
TOTAL PROTEIN: 6.6 g/dL (ref 6.4–8.3)

## 2017-01-27 LAB — CBC & DIFF AND RETIC
BASO%: 0.2 % (ref 0.0–2.0)
Basophils Absolute: 0 10*3/uL (ref 0.0–0.1)
EOS%: 0.2 % (ref 0.0–7.0)
Eosinophils Absolute: 0 10*3/uL (ref 0.0–0.5)
HCT: 43.6 % (ref 34.8–46.6)
HGB: 14 g/dL (ref 11.6–15.9)
Immature Retic Fract: 7.7 % (ref 1.60–10.00)
LYMPH#: 0.7 10*3/uL — AB (ref 0.9–3.3)
LYMPH%: 11.5 % — ABNORMAL LOW (ref 14.0–49.7)
MCH: 29.5 pg (ref 25.1–34.0)
MCHC: 32.1 g/dL (ref 31.5–36.0)
MCV: 91.8 fL (ref 79.5–101.0)
MONO#: 0.2 10*3/uL (ref 0.1–0.9)
MONO%: 2.8 % (ref 0.0–14.0)
NEUT#: 5.5 10*3/uL (ref 1.5–6.5)
NEUT%: 85.3 % — ABNORMAL HIGH (ref 38.4–76.8)
PLATELETS: 165 10*3/uL (ref 145–400)
RBC: 4.75 10*6/uL (ref 3.70–5.45)
RDW: 16.2 % — AB (ref 11.2–14.5)
RETIC %: 1.5 % (ref 0.70–2.10)
RETIC CT ABS: 71.25 10*3/uL (ref 33.70–90.70)
WBC: 6.4 10*3/uL (ref 3.9–10.3)

## 2017-01-27 NOTE — Progress Notes (Signed)
HEMATOLOGY/ONCOLOGY CLINIC NOTE  Date of Service: .01/27/2017  Patient Care Team: Sharilyn Sites, MD as PCP - General (Family Medicine) Ledon Snare MD (Radiation Oncology) Ashok Pall MD (Neurosurgery)  CHIEF COMPLAINTS/PURPOSE OF CONSULTATION:  Newly diagnosed GBM  HISTORY OF PRESENTING ILLNESS:   Donna Valentine is a wonderful 76 y.o. female who has been referred to [place] by Dr Ashok Pall, MD  for evaluation and management of newly diagnosed Glioblastoma Multiforme.  Patient has a h/o HLD, PAC's and borderline HTN presented to the ED on 11/20/2016 with sudden onset of left arm and some leg weakness and on CT head R frontoparietal lesion of 2.2 cm w/ vasogenic edema which was further evaluated with MRI. 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 No findings concerning for bleed or infarct.  Patient was treated with high dose steroids and keppra for SZ prophylaxis. Patient subsequently had an uncomplicated right frontal stereotactic craniotomy for tumor resection of the dominant rt frontoparietal mass but not all the satellite lesions (2 were apparently clinically evident as per surgery notes). She has had improvement in her left sided strength since her surgery and he cranial wound has been dry and clean. She is ambulating with walker and eating well. She has been setup for home Physical therapy.  I met with the patient as she was readied to be discharged home. She was seen with several family members at bedside. We discussed the diagnosis , prognosis and standard of care treatment options available and option to go to [facility] for consideration of possible clinical trials. NCCN guidelines were provided and discussed.  Patient has not been evaluated by radiation oncology yet.  Patient notes that she did not have any significant health limitation  prior to the diagnosis of her GBM.  INTERVAL HISTORY  Patient is here with several of her family members for follow-up of her GBM. She notes that she is finished nearly 5 weeks of radiation therapy concurrent with Temodar and is doing quite well. Recently had one of her daughters getting married.  It is down to dexamethasone 2 mg every other day. Eating well. Ambulating well. Still getting physical therapy twice weekly. No issues with nausea or vomiting. No fevers no chills. No seizures. No concerns or thrush. Has been scheduled for a follow-up post concurrent chemoradiation MRI on 02/13/2017 and has a follow-up at [facility] neuro-oncology on 02/20/2017.  MEDICAL HISTORY:      Past Medical History:  Diagnosis Date  . Cancer [facility])     SURGICAL HISTORY:      Past Surgical History:  Procedure Laterality Date  . APPENDECTOMY  1994  . APPLICATION OF CRANIAL NAVIGATION N/A 11/28/2016   Procedure: APPLICATION OF CRANIAL NAVIGATION;  Surgeon: Ashok Pall, MD;  Location: [place];  Service: Neurosurgery;  Laterality: N/A;  . CESAREAN SECTION     x3  . CRANIOTOMY Right 11/28/2016   Procedure: STERIOTACTIC PARIETAL CRANIOTOMY FOR RIGHT FRONTAL TUMOR RESECTION WITH BRAINLAB;  Surgeon: Ashok Pall, MD;  Location: [place];  Service: Neurosurgery;  Laterality: Right;    SOCIAL HISTORY: Social History        Social History  . Marital status: Married    Spouse name: N/A  . Number of children: N/A  . Years of education: N/A      Occupational History  . Not on file.        Social History Main  Topics  . Smoking status: Former Smoker    Quit date: 11/26/1991  . Smokeless tobacco: Never Used  . Alcohol use No  . Drug use: No  . Sexual activity: Not on file       Other Topics Concern  . Not on file      Social History Narrative  . No narrative on file    FAMILY HISTORY: History reviewed. No pertinent family history.  ALLERGIES:  is allergic to penicillins and  food.  MEDICATIONS: . Current Outpatient Prescriptions on File Prior to Visit  Medication Sig Dispense Refill  . dexamethasone (DECADRON) 4 MG tablet Take by mouth.    Marland Kitchen ibuprofen (ADVIL,MOTRIN) 200 MG tablet Take 400 mg by mouth every 8 (eight) hours as needed.    . levETIRAcetam (KEPPRA) 500 MG tablet Take 1 tablet (500 mg total) by mouth 2 (two) times daily. 60 tablet 5  . loratadine (CLARITIN) 10 MG tablet Take 10 mg by mouth daily as needed for allergies.    Marland Kitchen nystatin (MYCOSTATIN) 100000 UNIT/ML suspension Take 5 mLs (500,000 Units total) by mouth 4 (four) times daily. Swish in mouth for 20 seconds and then swallow or spit 60 mL 0  . ondansetron (ZOFRAN) 8 MG tablet Take 1 tablet (8 mg total) by mouth every 8 (eight) hours as needed for nausea. 30 tablet 3  . sulfamethoxazole-trimethoprim (BACTRIM DS,SEPTRA DS) 800-160 MG tablet Take 1 tablet by mouth 3 (three) times a week. 15 tablet 1  . temozolomide (TEMODAR) 140 MG capsule Take 1 capsule (140 mg total) by mouth daily. May take on an empty stomach or at bedtime to decrease nausea & vomiting. 42 capsule 0  . traMADol (ULTRAM) 50 MG tablet Take 1 tablet (50 mg total) by mouth every 6 (six) hours as needed. 30 tablet 0   No current facility-administered medications on file prior to visit.       REVIEW OF SYSTEMS:    10 Point review of Systems was done is negative except as noted above.  PHYSICAL EXAMINATION: ECOG PERFORMANCE STATUS: 1 - Symptomatic but completely ambulatory .BP 133/62 (BP Location: Left Arm, Patient Position: Sitting)   Pulse 77   Temp 98.1 F (36.7 C) (Oral)   Resp 18   Ht _0  (1.676 m)   Wt 191 lb 11.2 oz (87 kg)   SpO2 100%   BMI 30.94 kg/m   GENERAL:alert, in no acute distress and comfortable SKIN: skin color, texture, turgor are normal, no rashes or significant lesions EYES: normal, conjunctiva are pink and non-injected, sclera clear OROPHARYNX:no exudate, no erythema and lips, buccal  mucosa, and tongue normal  NECK: supple, no JVD, thyroid normal size, non-tender, without nodularity LYMPH:  no palpable lymphadenopathy in the cervical, axillary or inguinal LUNGS: clear to auscultation with normal respiratory effort HEART: regular rate & rhythm,  no murmurs and no lower extremity edema ABDOMEN: abdomen soft, non-tender, normoactive bowel sounds  Musculoskeletal: no cyanosis of digits and no clubbing  PSYCH: alert & oriented x 3 with fluent speech NEURO:4/5 LUE strength, 4+/5 LLE strength, 5/5 on rt side UE and LE  LABORATORY DATA:  I have reviewed the data as listed  . CBC Latest Ref Rng & Units 01/27/2017 12/29/2016 12/15/2016  WBC 3.9 - 10.3 10e3/uL 6.4 8.5 7.0  Hemoglobin 11.6 - 15.9 g/dL 14.0 13.7 14.3  Hematocrit 34.8 - 46.6 % 43.6 41.8 43.1  Platelets 145 - 400 10e3/uL 165 226 123(L)   . CMP Latest Ref Rng &  Units 01/27/2017 12/15/2016 11/29/2016  Glucose 70 - 140 mg/dl 136 208(H) -  BUN 7.0 - 26.0 mg/dL 19.7 19.6 -  Creatinine 0.6 - 1.1 mg/dL 1.0 0.9 0.75  Sodium 136 - 145 mEq/L 144 140 -  Potassium 3.5 - 5.1 mEq/L 4.6 4.3 -  Chloride 101 - 111 mmol/L - - -  CO2 22 - 29 mEq/L 25 26 -  Calcium 8.4 - 10.4 mg/dL 8.9 8.9 -  Total Protein 6.4 - 8.3 g/dL 6.6 6.5 -  Total Bilirubin 0.20 - 1.20 mg/dL 0.53 0.36 -  Alkaline Phos 40 - 150 U/L 60 92 -  AST 5 - 34 U/L 22 14 -  ALT 0 - 55 U/L 34 24 -        RADIOGRAPHIC STUDIES: I have personally reviewed the radiological images as listed and agreed with the findings in the report.  ImagingResults  Dg Chest 2 View  Result Date: 11/19/2016 CLINICAL DATA:  Productive cough for the past 2 weeks. Former smoker. EXAM: CHEST  2 VIEW COMPARISON:  Chest x-ray of July 10, 2010 FINDINGS: The lungs are well-expanded. There is no focal infiltrate. There is a pectus excavatum type chest contour. The heart and pulmonary vascularity are normal. The mediastinum is normal in width. There is no pleural effusion. There is  moderate dextrocurvature centered in the lower thoracic spine which is stable. IMPRESSION: Mild chronic bronchitic changes, stable. No pneumonia, pulmonary edema, nor other acute cardiopulmonary abnormality. Electronically Signed   By: David  Martinique M.D.   On: 11/19/2016 14:26   Ct Head Wo Contrast  Result Date: 11/20/2016 CLINICAL DATA:  Left arm weakness. EXAM: CT HEAD WITHOUT CONTRAST TECHNIQUE: Contiguous axial images were obtained from the base of the skull through the vertex without intravenous contrast. COMPARISON:  None. FINDINGS: Brain: Large amount of edema is identified in the high right frontal parietal region. Appearance is more suggestive of vasogenic edema than cytotoxic edema on this study. Features may be related to a potential mass lesion identified in the high right parietal region measuring 2.2 cm (image 25 series 201). Despite the edema, no subfalcine midline shift is evident. No evidence for hydrocephalus. Vascular: No hyperdense vessel or unexpected calcification. Skull: Normal. Negative for fracture or focal lesion. Sinuses/Orbits: No acute finding. Other: None. IMPRESSION: 1. Relatively large volume of apparent vasogenic edema in the high right frontoparietal region, likely secondary to a 2.2 cm high frontal parietal lobe lesion at the gray-white junction. MRI without and with contrast would be the study of choice to further evaluate. 2. No substantial mass-effect or midline shift. I discussed these findings by telephone with the triage nurse Valarie Merino) at [place] 5 hours on 05/24/2017. Electronically Signed   By: Misty Stanley M.D.   On: 11/20/2016 18:12   Ct Head W Contrast  Result Date: 11/21/2016 CLINICAL DATA:  76 year old female. Study for stereotactic surgical planning. 2.2 cm enhancing right superior frontal lobe mass and surrounding edema vs. nonenhancing tumor. Initial encounter. EXAM: CT HEAD WITH CONTRAST TECHNIQUE: Contiguous axial images were obtained from the base of the  skull through the vertex with intravenous contrast. CONTRAST:  75 mL Isovue-300 COMPARISON:  Brain MRI without and with contrast and noncontrast head CT 11/20/2016 FINDINGS: Brain: The roughly 2 cm round enhancing mass at the right superior frontal gyrus is seen on series 3, image 138. The more faint nodular enhancement anterior to the lesion is not evident by CT. The surrounding white matter hypodensity which on MRI most resembled vasogenic edema is  stable. Mild regional mass effect with no midline shift. No ventriculomegaly. No loss basilar cisterns. Elsewhere gray-white matter differentiation is within normal limits. No acute intracranial hemorrhage identified. No other abnormal intracranial enhancement. Vascular: Major intracranial vascular structures are enhancing. Skull: No osseous abnormality identified. Sinuses/Orbits: Visualized paranasal sinuses and mastoids are stable and well pneumatized. Other: Negative orbit and scalp soft tissues. IMPRESSION: 1. Study for stereotactic surgical planning. 2. Enhancing mass in the right superior frontal gyrus is on series 3, image 138. Surrounding white matter hypodensity most suggestive of vasogenic edema is stable with mild regional mass effect but no midline shift. 3. No new intracranial abnormality. Electronically Signed   By: Genevie Ann M.D.   On: 11/21/2016 13:52   Ct Head W Wo Contrast  Result Date: 11/29/2016 CLINICAL DATA:  76 year old female status post resection of right superior frontal lobe tumor, postop day 1 Initial encounter. EXAM: CT HEAD WITHOUT AND WITH CONTRAST TECHNIQUE: Contiguous axial images were obtained from the base of the skull through the vertex without and with intravenous contrast CONTRAST:  22m ISOVUE-300 IOPAMIDOL (ISOVUE-300) INJECTION 61% COMPARISON:  Preoperative stereotactic CT 11/21/2016 and earlier. FINDINGS: Brain: Small volume postoperative pneumocephalus. Small volume of gas and blood products in the right superior frontal  gyrus resection cavity (series 2, image 23). Following contrast, no mass like enhancement is evident. Surrounding white matter hypodensity with mild mass effect tracking into the more inferior right frontal lobe is stable. There is trace leftward midline shift and mild mass effect on the right lateral ventricle now. No ventriculomegaly. No other intracranial hemorrhage identified. Basilar cisterns remain patent. No other abnormal intracranial enhancement. Vascular: Major intracranial vascular structures appear to be normally enhancing. Skull: Sequelae of right frontoparietal craniotomy. No other acute osseous abnormality. Sinuses/Orbits: Visualized paranasal sinuses and mastoids are stable and well pneumatized. Other: Mild right scalp postoperative changes from craniotomy. Skin staples in place. No other acute orbit or scalp soft tissue finding. IMPRESSION: 1. Status post right superior frontal gyrus tumor resection with no adverse features. Stable right frontal lobe edema or nonenhancing tumor. No residual masslike enhancement is evident by post-contrast CT. 2. Mild postoperative intracranial mass effect with trace leftward midline shift. Electronically Signed   By: HGenevie AnnM.D.   On: 11/29/2016 16:44   Mr BJeri CosWZOContrast  Result Date: 11/20/2016 CLINICAL DATA:  LEFT-sided weakness for 1 day, assess for stroke. EXAM: MRI HEAD WITHOUT AND WITH CONTRAST TECHNIQUE: Multiplanar, multiecho pulse sequences of the brain and surrounding structures were obtained without and with intravenous contrast. CONTRAST:  130mMULTIHANCE GADOBENATE DIMEGLUMINE 529 MG/ML IV SOLN COMPARISON:  CT HEAD November 20, 2016 at 1752 hours FINDINGS: INTRACRANIAL CONTENTS: Ring-enhancing RIGHT frontal lobe convexity intraparenchymal 2.2 x 1.6 x 1.9 cm mass extending to the dura, central subcentimeter susceptibility artifact which could reflect mineralization or hemosiderin. Corresponding reduced diffusion and low ADC values. At least 4  additional subcentimeter satellite nodules anterior to the dominant lesion in the RIGHT frontal lobe juxta cortical white matter. Extensive surrounding T2 bright vasogenic edema and regional sulcal effacement. No midline shift. Ventricles and sulci are normal for patient's age. Patchy supratentorial white matter FLAIR T2 hyperintensities exclusive of the aforementioned abnormality compatible with mild to moderate chronic small vessel ischemic disease. No abnormal extra-axial enhancement. VASCULAR: Normal major intracranial vascular flow voids present at skull base. SKULL AND UPPER CERVICAL SPINE: No abnormal sellar expansion. No suspicious calvarial bone marrow signal. Craniocervical junction maintained. SINUSES/ORBITS: The mastoid air-cells and included paranasal sinuses  are well-aerated.The included ocular globes and orbital contents are non-suspicious. OTHER: None. IMPRESSION: Dominant 2.2 x 1.6 x 1.9 cm RIGHT frontal lobe mass with subcentimeter satellite nodules within extensive vasogenic edema. 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 No infarct.  Mild chronic small vessel ischemic disease. Acute findings discussed with and reconfirmed by Dr.ROBERT LOCKWOOD on 11/20/2016 at 8:25 pm. Electronically Signed   By: Elon Alas M.D.   On: 11/20/2016 20:28     ASSESSMENT & PLAN:   76 yo previously healthy caucasian female with no significant Chronic medical co-mrobidities with   1) Newly diagnosed Rt fronto-parietal GBM with a few satellite lesions. 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 (Neurosurgery - Dr Christella Noa). Plan -Patient is tolerating concurrent chemotherapy with temozolomide and radiation well with no significant toxicities at this time. She is in her fifth week of radiation therapy and she'll be  completing next week. -Labs today looks stable. -Continue temozolomide at current dose while on radiation therapy. -His down to dexamethasone 2 mg every other day and is being tapered off as per radiation oncology. -Follow-up [facility]/13/2017 [facility]. -She has at [facility] neuro-oncology follow-up on 02/20/2017. -on keppra for SZ prophylaxis. -Several family members present for the clinic visit and had several questions which were answered in details. -We will plan to see her back in 3 weeks with labs to reevaluate imaging results. -Would plan to treat with adjuvant temozolomide for 6-12 months based on tolerance unless any alternative clinical trial or treatment recommendations from her [place] neuro-oncology team.  2) left eye lower eyelid hordeolum Plan  -patient has an ophthalmologist Dr. Eulas Post in [place] and she'll be following up with him today or tomorrow  -Recommended warm compresses . -Would treat with erythromycin eyedrops during the day and ointment at nighttime  -Patient will call us if she cannot get her eye doctor to see her today or tomorrow and we will order the eyedrops .  -All of the patients questions were answered with apparent satisfaction. The patient knows to call the clinic with any problems, questions or concerns.  I spent 20 minutes counseling the patient face to face. The total time spent in the appointment was 25 minutes and more than 50% was on counseling and direct patient cares and co-ordination or cares with neuro-surgery and radiation oncology and answering the family's several questions.    Sullivan Lone MD [place] AAHIVMS [facility] Hematology/Oncology Physician [facility]  (Office):       [phone] (Work cell):  [phone] (Fax):           [phone]

## 2017-01-27 NOTE — Telephone Encounter (Signed)
Appointments scheduled per 4.17.18 LOS. Patient given AVS report and calendars with future scheduled appointments. °

## 2017-01-28 ENCOUNTER — Ambulatory Visit (HOSPITAL_COMMUNITY): Payer: Medicare Other | Admitting: Occupational Therapy

## 2017-01-28 ENCOUNTER — Ambulatory Visit (HOSPITAL_COMMUNITY): Payer: Medicare Other | Admitting: Physical Therapy

## 2017-01-28 ENCOUNTER — Ambulatory Visit
Admission: RE | Admit: 2017-01-28 | Discharge: 2017-01-28 | Disposition: A | Discharge status: Home / Self Care | Payer: Medicare Other | Source: Ambulatory Visit | Attending: Radiation Oncology | Admitting: Radiation Oncology

## 2017-01-28 DIAGNOSIS — Z510 Encounter for antineoplastic radiation therapy: Secondary | ICD-10-CM | POA: Diagnosis not present

## 2017-01-29 ENCOUNTER — Ambulatory Visit
Admission: RE | Admit: 2017-01-29 | Discharge: 2017-01-29 | Disposition: A | Discharge status: Home / Self Care | Payer: Medicare Other | Source: Ambulatory Visit | Attending: Radiation Oncology | Admitting: Radiation Oncology

## 2017-01-29 DIAGNOSIS — Z510 Encounter for antineoplastic radiation therapy: Secondary | ICD-10-CM | POA: Diagnosis not present

## 2017-01-30 ENCOUNTER — Ambulatory Visit
Admission: RE | Admit: 2017-01-30 | Discharge: 2017-01-30 | Disposition: A | Discharge status: Home / Self Care | Payer: Medicare Other | Source: Ambulatory Visit | Attending: Radiation Oncology | Admitting: Radiation Oncology

## 2017-01-30 DIAGNOSIS — Z510 Encounter for antineoplastic radiation therapy: Secondary | ICD-10-CM | POA: Diagnosis not present

## 2017-02-02 ENCOUNTER — Ambulatory Visit
Admission: RE | Admit: 2017-02-02 | Discharge: 2017-02-02 | Disposition: A | Discharge status: Home / Self Care | Payer: Medicare Other | Source: Ambulatory Visit | Attending: Radiation Oncology | Admitting: Radiation Oncology

## 2017-02-02 ENCOUNTER — Ambulatory Visit (HOSPITAL_COMMUNITY): Payer: Medicare Other | Admitting: Occupational Therapy

## 2017-02-02 ENCOUNTER — Telehealth (HOSPITAL_COMMUNITY): Payer: Self-pay | Admitting: Family Medicine

## 2017-02-02 DIAGNOSIS — Z510 Encounter for antineoplastic radiation therapy: Secondary | ICD-10-CM | POA: Diagnosis not present

## 2017-02-02 NOTE — Telephone Encounter (Signed)
02/02/17 cx this week, she said she was just wore out and didn't feel like coming.  She will return in May

## 2017-02-03 ENCOUNTER — Ambulatory Visit
Admission: RE | Admit: 2017-02-03 | Discharge: 2017-02-03 | Disposition: A | Discharge status: Home / Self Care | Payer: Medicare Other | Source: Ambulatory Visit | Attending: Radiation Oncology | Admitting: Radiation Oncology

## 2017-02-03 DIAGNOSIS — Z510 Encounter for antineoplastic radiation therapy: Secondary | ICD-10-CM | POA: Diagnosis not present

## 2017-02-04 ENCOUNTER — Ambulatory Visit
Admission: RE | Admit: 2017-02-04 | Discharge: 2017-02-04 | Disposition: A | Discharge status: Home / Self Care | Payer: Medicare Other | Source: Ambulatory Visit | Attending: Radiation Oncology | Admitting: Radiation Oncology

## 2017-02-04 ENCOUNTER — Ambulatory Visit (HOSPITAL_COMMUNITY): Payer: Medicare Other | Admitting: Physical Therapy

## 2017-02-04 ENCOUNTER — Ambulatory Visit (HOSPITAL_COMMUNITY): Payer: Medicare Other | Admitting: Specialist

## 2017-02-04 DIAGNOSIS — Z510 Encounter for antineoplastic radiation therapy: Secondary | ICD-10-CM | POA: Diagnosis not present

## 2017-02-05 ENCOUNTER — Ambulatory Visit
Admission: RE | Admit: 2017-02-05 | Discharge: 2017-02-05 | Disposition: A | Discharge status: Home / Self Care | Payer: Medicare Other | Source: Ambulatory Visit | Attending: Radiation Oncology | Admitting: Radiation Oncology

## 2017-02-05 DIAGNOSIS — Z510 Encounter for antineoplastic radiation therapy: Secondary | ICD-10-CM | POA: Diagnosis not present

## 2017-02-06 ENCOUNTER — Ambulatory Visit
Admission: RE | Admit: 2017-02-06 | Discharge: 2017-02-06 | Disposition: A | Discharge status: Home / Self Care | Payer: Medicare Other | Source: Ambulatory Visit | Attending: Radiation Oncology | Admitting: Radiation Oncology

## 2017-02-06 ENCOUNTER — Encounter: Payer: Self-pay | Admitting: Radiation Oncology

## 2017-02-06 DIAGNOSIS — Z510 Encounter for antineoplastic radiation therapy: Secondary | ICD-10-CM | POA: Diagnosis not present

## 2017-02-09 ENCOUNTER — Encounter (HOSPITAL_COMMUNITY): Payer: Medicare Other | Admitting: Specialist

## 2017-02-09 NOTE — Progress Notes (Signed)
  Radiation Oncology         [phone] ________________________________  Name: Donna Valentine MRN: [id number]  Date: 02/06/2017  DOB: March 19, 1941  End of Treatment Note  Diagnosis:   76 year old woman post partial resection of a multifocal 2.2 cm glioblastoma of the right frontal lobe     Indication for treatment:  Local Control       Radiation treatment dates:   12/29/2016 to 02/06/2017  Site/dose:    1. The initial enhancing tumor, peritumoral edema, plus 1 cm were treated to 44 Gy in 22 fractions. 2. The enhancing tumor plus 1 cm was boosted to 60 Gy with 8 additional fractions of 2 Gy.  Beams/energy:  1. The initial enhancing tumor, peritumoral edema, plus 1 cm were treated using helical intensity modulated radiotherapy delivering 6 megavolt photons. Image guidance was performed with megavoltage CT studies prior to each fraction. He was immobilized with a thermoplastic mask. 2. The enhancing tumor plus 1 cm was boosted using helical intensity modulated radiotherapy delivering 6 megavolt photons. Image guidance was performed with megavoltage CT studies prior to each fraction. He was immobilized with a a thermoplastic mask.  Narrative: The patient tolerated radiation treatment relatively well.  Some hair loss was noted in the treated area.  She reports increasing fatigue. She reports numbness at the incision site but, denies that her scalp is sore or tender. She notes weight gain of 16 lb since February. She denies pain, headaches, dizziness, nausea, emesis, diplopia, or ringing of the ears.   Plan: The patient has completed radiation treatment. The patient will return to radiation oncology clinic for routine followup in one month. The patient is scheduled to follow up on 03/10/17. Brain MRI on 02/13/17. The patient's MRI and case will be discussed in brain conference on 02/16/17. I advised him to call or return sooner if he has any questions or concerns related to his recovery or  treatment. ________________________________  Sheral Apley. Tammi Klippel, M.D.  This document serves as a record of services personally performed by Tyler Pita, MD. It was created on his behalf by Arlyce Harman, a trained medical scribe. The creation of this record is based on the scribe's personal observations and the provider's statements to them. This document has been checked and approved by the attending provider.

## 2017-02-11 ENCOUNTER — Ambulatory Visit (HOSPITAL_COMMUNITY): Payer: Medicare Other

## 2017-02-13 ENCOUNTER — Ambulatory Visit (HOSPITAL_COMMUNITY)
Admission: RE | Admit: 2017-02-13 | Discharge: 2017-02-13 | Disposition: A | Discharge status: Home / Self Care | Payer: Medicare Other | Source: Ambulatory Visit | Attending: Radiation Oncology | Admitting: Radiation Oncology

## 2017-02-13 DIAGNOSIS — C711 Malignant neoplasm of frontal lobe: Secondary | ICD-10-CM | POA: Diagnosis present

## 2017-02-13 MED ORDER — GADOBENATE DIMEGLUMINE 529 MG/ML IV SOLN
20.0000 mL | Freq: Once | INTRAVENOUS | Status: AC | PRN
Start: 2017-02-13 — End: 2017-02-13
  Administered 2017-02-13: 18 mL via INTRAVENOUS

## 2017-02-16 ENCOUNTER — Encounter (HOSPITAL_COMMUNITY): Payer: Medicare Other | Admitting: Specialist

## 2017-02-16 ENCOUNTER — Encounter (HOSPITAL_COMMUNITY): Payer: Medicare Other | Admitting: Physical Therapy

## 2017-02-17 ENCOUNTER — Encounter: Payer: Self-pay | Admitting: Neurology

## 2017-02-17 ENCOUNTER — Other Ambulatory Visit (HOSPITAL_BASED_OUTPATIENT_CLINIC_OR_DEPARTMENT_OTHER): Payer: Medicare Other

## 2017-02-17 ENCOUNTER — Encounter: Payer: Self-pay | Admitting: Hematology

## 2017-02-17 ENCOUNTER — Ambulatory Visit (HOSPITAL_BASED_OUTPATIENT_CLINIC_OR_DEPARTMENT_OTHER): Payer: Medicare Other | Admitting: Hematology

## 2017-02-17 ENCOUNTER — Telehealth: Payer: Self-pay | Admitting: Hematology

## 2017-02-17 VITALS — BP 131/60 | HR 92 | Temp 98.6°F | Resp 18 | Ht 66.0 in | Wt 187.5 lb

## 2017-02-17 DIAGNOSIS — G969 Disorder of central nervous system, unspecified: Secondary | ICD-10-CM

## 2017-02-17 DIAGNOSIS — R7989 Other specified abnormal findings of blood chemistry: Secondary | ICD-10-CM

## 2017-02-17 DIAGNOSIS — C711 Malignant neoplasm of frontal lobe: Secondary | ICD-10-CM | POA: Diagnosis not present

## 2017-02-17 DIAGNOSIS — C719 Malignant neoplasm of brain, unspecified: Secondary | ICD-10-CM

## 2017-02-17 DIAGNOSIS — R945 Abnormal results of liver function studies: Secondary | ICD-10-CM

## 2017-02-17 DIAGNOSIS — H00015 Hordeolum externum left lower eyelid: Secondary | ICD-10-CM | POA: Diagnosis not present

## 2017-02-17 DIAGNOSIS — R251 Tremor, unspecified: Secondary | ICD-10-CM

## 2017-02-17 LAB — CBC & DIFF AND RETIC
BASO%: 0.6 % (ref 0.0–2.0)
Basophils Absolute: 0 10*3/uL (ref 0.0–0.1)
EOS%: 3.7 % (ref 0.0–7.0)
Eosinophils Absolute: 0.2 10*3/uL (ref 0.0–0.5)
HCT: 42.8 % (ref 34.8–46.6)
HGB: 13.8 g/dL (ref 11.6–15.9)
Immature Retic Fract: 9.8 % (ref 1.60–10.00)
LYMPH%: 19.7 % (ref 14.0–49.7)
MCH: 29.9 pg (ref 25.1–34.0)
MCHC: 32.2 g/dL (ref 31.5–36.0)
MCV: 92.6 fL (ref 79.5–101.0)
MONO#: 0.5 10*3/uL (ref 0.1–0.9)
MONO%: 10.3 % (ref 0.0–14.0)
NEUT#: 3.4 10*3/uL (ref 1.5–6.5)
NEUT%: 65.7 % (ref 38.4–76.8)
Platelets: 170 10*3/uL (ref 145–400)
RBC: 4.62 10*6/uL (ref 3.70–5.45)
RDW: 14.9 % — AB (ref 11.2–14.5)
RETIC %: 1.87 % (ref 0.70–2.10)
Retic Ct Abs: 86.39 10*3/uL (ref 33.70–90.70)
WBC: 5.1 10*3/uL (ref 3.9–10.3)
lymph#: 1 10*3/uL (ref 0.9–3.3)

## 2017-02-17 LAB — COMPREHENSIVE METABOLIC PANEL
ALBUMIN: 3.3 g/dL — AB (ref 3.5–5.0)
ALK PHOS: 62 U/L (ref 40–150)
ALT: 70 U/L — ABNORMAL HIGH (ref 0–55)
ANION GAP: 10 meq/L (ref 3–11)
AST: 69 U/L — ABNORMAL HIGH (ref 5–34)
BUN: 10.7 mg/dL (ref 7.0–26.0)
CALCIUM: 9.3 mg/dL (ref 8.4–10.4)
CO2: 27 mEq/L (ref 22–29)
Chloride: 106 mEq/L (ref 98–109)
Creatinine: 1 mg/dL (ref 0.6–1.1)
EGFR: 54 mL/min/{1.73_m2} — AB (ref >90)
Glucose: 142 mg/dl — ABNORMAL HIGH (ref 70–140)
Potassium: 3.9 mEq/L (ref 3.5–5.1)
Sodium: 143 mEq/L (ref 136–145)
Total Bilirubin: 0.58 mg/dL (ref 0.20–1.20)
Total Protein: 7.1 g/dL (ref 6.4–8.3)

## 2017-02-17 MED ORDER — LORAZEPAM 0.5 MG PO TABS: 0.5000 mg | ORAL_TABLET | Freq: Three times a day (TID) | ORAL | 0 refills | Status: DC | PRN

## 2017-02-17 NOTE — Telephone Encounter (Signed)
Appointments scheduled per 02/17/17 LOS. Patient given AVS report and calendars with future scheduled appointments.  °

## 2017-02-17 NOTE — Patient Instructions (Signed)
Thank you for choosing [facility] to provide your oncology and hematology care.  To afford each patient quality time with our providers, please arrive 30 minutes before your scheduled appointment time.  If you arrive late for your appointment, you may be asked to reschedule.  We strive to give you quality time with our providers, and arriving late affects you and other patients whose appointments are after yours.  If you are a no show for multiple scheduled visits, you may be dismissed from the clinic at the providers discretion.   Again, thank you for choosing [facility], our hope is that these requests will decrease the amount of time that you wait before being seen by our physicians.  ______________________________________________________________________ Should you have questions after your visit to the [facility], please contact our office at [phone] between the hours of 8:30 and 4:30 p.m.    Voicemails left after 4:30p.m will not be returned until the following business day.   For prescription refill requests, please have your pharmacy contact us directly.  Please also try to allow 48 hours for prescription requests.   Please contact the scheduling department for questions regarding scheduling.  For scheduling of procedures such as PET scans, CT scans, MRI, Ultrasound, etc please contact central scheduling at [phone].   Resources For Cancer Patients and Caregivers:  American Cancer Society:  800-227-2345  Can help patients locate various types of support and financial assistance Cancer Care: 1-800-813-HOPE (4673) Provides financial assistance, online support groups, medication/co-pay assistance.   [place] DSS:  336-641-3447 Where to apply for food stamps, Medicaid, and utility assistance Medicare Rights Center: 800-333-4114 Helps people with Medicare understand their rights and benefits, navigate the Medicare system, and secure the  quality healthcare they deserve SCAT: 336-333-6589 [place] [employer]'s shared-ride transportation service for eligible riders who have a disability that prevents them from riding the fixed route bus.   For additional information on assistance programs please contact our social worker:   Grier Hock/Abigail Elmore:  [phone] 

## 2017-02-18 ENCOUNTER — Encounter (HOSPITAL_COMMUNITY): Payer: Medicare Other | Admitting: Specialist

## 2017-02-18 ENCOUNTER — Encounter (HOSPITAL_COMMUNITY): Payer: Medicare Other

## 2017-02-18 NOTE — Progress Notes (Signed)
HEMATOLOGY/ONCOLOGY CLINIC NOTE  Date of Service: .02/17/2017  Patient Care Team: Sharilyn Sites, MD as PCP - General (Family Medicine) Ledon Snare MD (Radiation Oncology) Ashok Pall MD (Neurosurgery)  CHIEF COMPLAINTS/PURPOSE OF CONSULTATION:  f/u GBM  HISTORY OF PRESENTING ILLNESS:   Donna Valentine is a wonderful 76 y.o. female who has been referred to [place] by Dr Ashok Pall, MD  for evaluation and management of newly diagnosed Glioblastoma Multiforme.  Patient has a h/o HLD, PAC's and borderline HTN presented to the ED on 11/20/2016 with sudden onset of left arm and some leg weakness and on CT head R frontoparietal lesion of 2.2 cm w/ vasogenic edema which was further evaluated with MRI. 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 No findings concerning for bleed or infarct.  Patient was treated with high dose steroids and keppra for SZ prophylaxis. Patient subsequently had an uncomplicated right frontal stereotactic craniotomy for tumor resection of the dominant rt frontoparietal mass but not all the satellite lesions (2 were apparently clinically evident as per surgery notes). She has had improvement in her left sided strength since her surgery and he cranial wound has been dry and clean. She is ambulating with walker and eating well. She has been setup for home Physical therapy.  I met with the patient as she was readied to be discharged home. She was seen with several family members at bedside. We discussed the diagnosis , prognosis and standard of care treatment options available and option to go to [facility] for consideration of possible clinical trials. NCCN guidelines were provided and discussed.  Patient has not been evaluated by radiation oncology yet.  Patient notes that she did not have any significant health limitation prior to the  diagnosis of her GBM.  INTERVAL HISTORY  Patient is here with several of her family members for follow-up of her GBM. She has completed his planned concurrent chemo-radiation. MRI brain on 02/13/2017 shows mildly increased area of enhancement at the treatment site in the rt superior frontal gyrus which is likely treatment effect with findings of pseudo-progression. Patient notes she has had mild issues with fine tremors even before her current issues with GBM but that her b/l upper extremity tremors have gotten prominently more bothersome and are present at rest and with intention. She notes she is feeling more anxious. No increased tone or other new focal neurological deficits. 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   MEDICAL HISTORY:      Past Medical History:  Diagnosis Date  . Cancer [facility])     SURGICAL HISTORY:      Past Surgical History:  Procedure Laterality Date  . APPENDECTOMY  1994  . APPLICATION OF CRANIAL NAVIGATION N/A 11/28/2016   Procedure: APPLICATION OF CRANIAL NAVIGATION;  Surgeon: Ashok Pall, MD;  Location: [place];  Service: Neurosurgery;  Laterality: N/A;  . CESAREAN SECTION     x3  . CRANIOTOMY Right 11/28/2016   Procedure: STERIOTACTIC PARIETAL CRANIOTOMY FOR RIGHT FRONTAL TUMOR RESECTION WITH BRAINLAB;  Surgeon: Ashok Pall, MD;  Location: [place];  Service: Neurosurgery;  Laterality: Right;    SOCIAL HISTORY: Social History  Social History  . Marital status: Married    Spouse name: N/A  . Number of children: N/A  . Years of education: N/A      Occupational History  . Not on file.        Social History Main Topics  . Smoking status: Former Smoker    Quit date: 11/26/1991  . Smokeless tobacco:  Never Used  . Alcohol use No  . Drug use: No  . Sexual activity: Not on file       Other Topics Concern  . Not on file      Social History Narrative  . No narrative on file    FAMILY HISTORY: History reviewed. No pertinent family history.  ALLERGIES:  is allergic to penicillins and food.  MEDICATIONS: . Current Outpatient Prescriptions on File Prior to Visit  Medication Sig Dispense Refill  . levETIRAcetam (KEPPRA) 500 MG tablet Take 1 tablet (500 mg total) by mouth 2 (two) times daily. 60 tablet 5  . nystatin (MYCOSTATIN) 100000 UNIT/ML suspension Take 5 mLs (500,000 Units total) by mouth 4 (four) times daily. Swish in mouth for 20 seconds and then swallow or spit 60 mL 0  . ondansetron (ZOFRAN) 8 MG tablet Take 1 tablet (8 mg total) by mouth every 8 (eight) hours as needed for nausea. 30 tablet 3  . sulfamethoxazole-trimethoprim (BACTRIM DS,SEPTRA DS) 800-160 MG tablet Take 1 tablet by mouth 3 (three) times a week. 15 tablet 1  . dexamethasone (DECADRON) 4 MG tablet Take by mouth.    . temozolomide (TEMODAR) 140 MG capsule Take 1 capsule (140 mg total) by mouth daily. May take on an empty stomach or at bedtime to decrease nausea & vomiting. (Patient not taking: Reported on 02/17/2017) 42 capsule 0   No current facility-administered medications on file prior to visit.       REVIEW OF SYSTEMS:    10 Point review of Systems was done is negative except as noted above.  PHYSICAL EXAMINATION: ECOG PERFORMANCE STATUS: 1 - Symptomatic but completely ambulatory .BP 131/60 (BP Location: Right Arm, Patient Position: Sitting)   Pulse 92   Temp 98.6 F (37 C) (Oral)   Resp 18   Ht _0  (1.676 m)   Wt 187 lb 8 oz (85 kg)   SpO2 100%   BMI 30.26 kg/m   GENERAL:alert, in no acute distress and comfortable SKIN: skin color, texture, turgor are normal, no rashes or significant lesions EYES: normal, conjunctiva are pink and non-injected, sclera clear OROPHARYNX:no  exudate, no erythema and lips, buccal mucosa, and tongue normal  NECK: supple, no JVD, thyroid normal size, non-tender, without nodularity LYMPH:  no palpable lymphadenopathy in the cervical, axillary or inguinal LUNGS: clear to auscultation with normal respiratory effort HEART: regular rate & rhythm,  no murmurs and no lower extremity edema ABDOMEN: abdomen soft, non-tender, normoactive bowel sounds  Musculoskeletal: no cyanosis of digits and no clubbing  PSYCH: alert & oriented x 3 with fluent speech NEURO:4/5 LUE strength, 4+/5 LLE strength, 5/5 on rt side UE and LE  LABORATORY DATA:  I have reviewed the data as listed  . CBC Latest Ref Rng & Units 02/17/2017 01/27/2017 12/29/2016  WBC 3.9 - 10.3 10e3/uL 5.1 6.4 8.5  Hemoglobin 11.6 - 15.9 g/dL 13.8 14.0 13.7  Hematocrit 34.8 - 46.6 % 42.8 43.6 41.8  Platelets 145 - 400 10e3/uL 170 165 226   . CMP Latest Ref Rng & Units 02/17/2017 01/27/2017 12/15/2016  Glucose 70 - 140 mg/dl  142(H) 136 208(H)  BUN 7.0 - 26.0 mg/dL 10.7 19.7 19.6  Creatinine 0.6 - 1.1 mg/dL 1.0 1.0 0.9  Sodium 136 - 145 mEq/L 143 144 140  Potassium 3.5 - 5.1 mEq/L 3.9 4.6 4.3  Chloride 101 - 111 mmol/L - - -  CO2 22 - 29 mEq/L '27 25 26  '$ Calcium 8.4 - 10.4 mg/dL 9.3 8.9 8.9  Total Protein 6.4 - 8.3 g/dL 7.1 6.6 6.5  Total Bilirubin 0.20 - 1.20 mg/dL 0.58 0.53 0.36  Alkaline Phos 40 - 150 U/L 62 60 92  AST 5 - 34 U/L 69(H) 22 14  ALT 0 - 55 U/L 70(H) 34 24        RADIOGRAPHIC STUDIES: I have personally reviewed the radiological images as listed and agreed with the findings in the report.  .Mr Jeri Cos Wo Contrast  Addendum Date: 02/13/2017   ADDENDUM REPORT: 02/13/2017 13:12 ADDENDUM: Study discussed by telephone with Dr. Tyler Pita on 02/13/2017 at 13:12 . Electronically Signed   By: Genevie Ann M.D.   On: 02/13/2017 13:12   Result Date: 02/13/2017 CLINICAL DATA:  76 year old female with multifocal right frontal lobe glioblastoma. Status post surgery in  February. IMRT Radiation therapy completed on 02/06/2017. EXAM: MRI HEAD WITHOUT AND WITH CONTRAST TECHNIQUE: Multiplanar, multiecho pulse sequences of the brain and surrounding structures were obtained without and with intravenous contrast. CONTRAST:  69m MULTIHANCE GADOBENATE DIMEGLUMINE 529 MG/ML IV SOLN COMPARISON:  Postoperative brain MRI 12/16/2016 and earlier. FINDINGS: Brain: Sequelae of right superior convexity craniotomy again noted with small volume extra-axial fluid underlying the craniotomy site. Superimposed postoperative dural thickening which is contiguous with residual nodular enhancement in the right superior frontal gyrus corresponding to the site of the presentation enhancing mass. The area of enhancement is confluent as seen on the postoperative MRI, without evidence of the small petechial enhancement which tract anteriorly from the dominant lesion on the presentation study in February. Measured on the sagittal and coronal post-contrast images today the area of enhancement is 20 x 13 x 25 (versus 17 x 11 by 20 when measured at a comparable level on 12/16/2016. There is mildly restricted diffusion within the mass. Regional hemosiderin has regressed. Regional parenchymal T2 and FLAIR hyperintensity has mildly regressed since the postoperative scan, but there is new abnormal fluid in some subarachnoid spaces just posterior to the mass (series 8, image 41 today, which is isointense except on FLAIR imaging. No definite leptomeningeal enhancement is associated. Mild regional mass effect is stable. Superimposed scattered and patchy bilateral cerebral white matter nonspecific T2 and FLAIR hyperintensity elsewhere is stable. Chronic patchy T2 hyperintensity re - demonstrated in the pons. No other abnormal enhancement. No other dural thickening. No restricted diffusion or evidence of acute infarction. No ventriculomegaly. Basilar cisterns remain normal. Negative pituitary and cervicomedullary junction.  Vascular: Major intracranial vascular flow voids are stable. Skull and upper cervical spine: Negative visualized cervical spine and spinal cord. Visualized bone marrow signal is within normal limits. Sinuses/Orbits: Stable and negative. Other: Visible internal auditory structures appear normal. Mastoids remain clear. Mild postoperative changes to the right superior scalp soft tissues. IMPRESSION: 1. Mildly larger area of enhancement at the treatment site in the right superior frontal gyrus, contiguous with mild dural thickening underlying the craniotomy. Regional parenchymal T2 and FLAIR hyperintensity has mildly regressed. Considering the patient has recently completed postoperative radiation, this study will serve as a new post treatment baseline. 2. Unusual appearance of small volume fluid in the subarachnoid spaces just posterior  to the treatment site. This might represent a trace amount of subarachnoid hemorrhage, but is isointense to CSF except on FLAIR. No definite leptomeningeal enhancement, and no associated mass effect. Attention directed on followup. 3. No other acute intracranial abnormality. Electronically Signed: By: Genevie Ann M.D. On: 02/13/2017 12:04    ImagingResults  Dg Chest 2 View  Result Date: 11/19/2016 CLINICAL DATA:  Productive cough for the past 2 weeks. Former smoker. EXAM: CHEST  2 VIEW COMPARISON:  Chest x-ray of July 10, 2010 FINDINGS: The lungs are well-expanded. There is no focal infiltrate. There is a pectus excavatum type chest contour. The heart and pulmonary vascularity are normal. The mediastinum is normal in width. There is no pleural effusion. There is moderate dextrocurvature centered in the lower thoracic spine which is stable. IMPRESSION: Mild chronic bronchitic changes, stable. No pneumonia, pulmonary edema, nor other acute cardiopulmonary abnormality. Electronically Signed   By: David  Martinique M.D.   On: 11/19/2016 14:26   Ct Head Wo Contrast  Result Date:  11/20/2016 CLINICAL DATA:  Left arm weakness. EXAM: CT HEAD WITHOUT CONTRAST TECHNIQUE: Contiguous axial images were obtained from the base of the skull through the vertex without intravenous contrast. COMPARISON:  None. FINDINGS: Brain: Large amount of edema is identified in the high right frontal parietal region. Appearance is more suggestive of vasogenic edema than cytotoxic edema on this study. Features may be related to a potential mass lesion identified in the high right parietal region measuring 2.2 cm (image 25 series 201). Despite the edema, no subfalcine midline shift is evident. No evidence for hydrocephalus. Vascular: No hyperdense vessel or unexpected calcification. Skull: Normal. Negative for fracture or focal lesion. Sinuses/Orbits: No acute finding. Other: None. IMPRESSION: 1. Relatively large volume of apparent vasogenic edema in the high right frontoparietal region, likely secondary to a 2.2 cm high frontal parietal lobe lesion at the gray-white junction. MRI without and with contrast would be the study of choice to further evaluate. 2. No substantial mass-effect or midline shift. I discussed these findings by telephone with the triage nurse Valarie Merino) at [place] hours on 05/24/2017. Electronically Signed   By: Misty Stanley M.D.   On: 11/20/2016 18:12   Ct Head W Contrast  Result Date: 11/21/2016 CLINICAL DATA:  76 year old female. Study for stereotactic surgical planning. 2.2 cm enhancing right superior frontal lobe mass and surrounding edema vs. nonenhancing tumor. Initial encounter. EXAM: CT HEAD WITH CONTRAST TECHNIQUE: Contiguous axial images were obtained from the base of the skull through the vertex with intravenous contrast. CONTRAST:  75 mL Isovue-300 COMPARISON:  Brain MRI without and with contrast and noncontrast head CT 11/20/2016 FINDINGS: Brain: The roughly 2 cm round enhancing mass at the right superior frontal gyrus is seen on series 3, image 138. The more faint nodular enhancement  anterior to the lesion is not evident by CT. The surrounding white matter hypodensity which on MRI most resembled vasogenic edema is stable. Mild regional mass effect with no midline shift. No ventriculomegaly. No loss basilar cisterns. Elsewhere gray-white matter differentiation is within normal limits. No acute intracranial hemorrhage identified. No other abnormal intracranial enhancement. Vascular: Major intracranial vascular structures are enhancing. Skull: No osseous abnormality identified. Sinuses/Orbits: Visualized paranasal sinuses and mastoids are stable and well pneumatized. Other: Negative orbit and scalp soft tissues. IMPRESSION: 1. Study for stereotactic surgical planning. 2. Enhancing mass in the right superior frontal gyrus is on series 3, image 138. Surrounding white matter hypodensity most suggestive of vasogenic edema is stable with mild  regional mass effect but no midline shift. 3. No new intracranial abnormality. Electronically Signed   By: Genevie Ann M.D.   On: 11/21/2016 13:52   Ct Head W Wo Contrast  Result Date: 11/29/2016 CLINICAL DATA:  76 year old female status post resection of right superior frontal lobe tumor, postop day 1 Initial encounter. EXAM: CT HEAD WITHOUT AND WITH CONTRAST TECHNIQUE: Contiguous axial images were obtained from the base of the skull through the vertex without and with intravenous contrast CONTRAST:  58m ISOVUE-300 IOPAMIDOL (ISOVUE-300) INJECTION 61% COMPARISON:  Preoperative stereotactic CT 11/21/2016 and earlier. FINDINGS: Brain: Small volume postoperative pneumocephalus. Small volume of gas and blood products in the right superior frontal gyrus resection cavity (series 2, image 23). Following contrast, no mass like enhancement is evident. Surrounding white matter hypodensity with mild mass effect tracking into the more inferior right frontal lobe is stable. There is trace leftward midline shift and mild mass effect on the right lateral ventricle now. No  ventriculomegaly. No other intracranial hemorrhage identified. Basilar cisterns remain patent. No other abnormal intracranial enhancement. Vascular: Major intracranial vascular structures appear to be normally enhancing. Skull: Sequelae of right frontoparietal craniotomy. No other acute osseous abnormality. Sinuses/Orbits: Visualized paranasal sinuses and mastoids are stable and well pneumatized. Other: Mild right scalp postoperative changes from craniotomy. Skin staples in place. No other acute orbit or scalp soft tissue finding. IMPRESSION: 1. Status post right superior frontal gyrus tumor resection with no adverse features. Stable right frontal lobe edema or nonenhancing tumor. No residual masslike enhancement is evident by post-contrast CT. 2. Mild postoperative intracranial mass effect with trace leftward midline shift. Electronically Signed   By: HGenevie AnnM.D.   On: 11/29/2016 16:44   Mr BJeri CosWJKContrast  Result Date: 11/20/2016 CLINICAL DATA:  LEFT-sided weakness for 1 day, assess for stroke. EXAM: MRI HEAD WITHOUT AND WITH CONTRAST TECHNIQUE: Multiplanar, multiecho pulse sequences of the brain and surrounding structures were obtained without and with intravenous contrast. CONTRAST:  124mMULTIHANCE GADOBENATE DIMEGLUMINE 529 MG/ML IV SOLN COMPARISON:  CT HEAD November 20, 2016 at 1752 hours FINDINGS: INTRACRANIAL CONTENTS: Ring-enhancing RIGHT frontal lobe convexity intraparenchymal 2.2 x 1.6 x 1.9 cm mass extending to the dura, central subcentimeter susceptibility artifact which could reflect mineralization or hemosiderin. Corresponding reduced diffusion and low ADC values. At least 4 additional subcentimeter satellite nodules anterior to the dominant lesion in the RIGHT frontal lobe juxta cortical white matter. Extensive surrounding T2 bright vasogenic edema and regional sulcal effacement. No midline shift. Ventricles and sulci are normal for patient's age. Patchy supratentorial white matter FLAIR T2  hyperintensities exclusive of the aforementioned abnormality compatible with mild to moderate chronic small vessel ischemic disease. No abnormal extra-axial enhancement. VASCULAR: Normal major intracranial vascular flow voids present at skull base. SKULL AND UPPER CERVICAL SPINE: No abnormal sellar expansion. No suspicious calvarial bone marrow signal. Craniocervical junction maintained. SINUSES/ORBITS: The mastoid air-cells and included paranasal sinuses are well-aerated.The included ocular globes and orbital contents are non-suspicious. OTHER: None. IMPRESSION: Dominant 2.2 x 1.6 x 1.9 cm RIGHT frontal lobe mass with subcentimeter satellite nodules within extensive vasogenic edema. 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 No infarct.  Mild chronic small vessel ischemic disease. Acute findings discussed with and reconfirmed by Dr.ROBERT LOCKWOOD on 11/20/2016 at 8:25 pm. Electronically Signed   By: CoElon Alas.D.   On: 11/20/2016 20:28     ASSESSMENT & PLAN:   [age]o previously healthy caucasian  female with no significant Chronic medical co-mrobidities with   1) Newly diagnosed Rt fronto-parietal GBM with a few satellite lesions. 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 (Neurosurgery - Dr Christella Noa). Patient has completed concurrent chemo-radiation on 02/06/2017 and tolerated it well. Blood counts stable Plan -MRI brain results from 02/13/2017 were discussed in details -- some increased enhancement at the site of treatment is thought to represent treatment effect presenting as pseudo-progression. -She has at [facility] neuro-oncology follow-up on 02/20/2017. -unless [facility] neuro-onc team recommends a clinical trial or some treatment we shall proceed with adjuvant temozolamide. -on keppra for SZ prophylaxis. -We will  plan to see her back in 3 weeks with labs. -Would plan to treat with adjuvant temozolomide for 6-12 months based on tolerance unless any alternative clinical trial or treatment recommendations from her [place] neuro-oncology team.  2) left eye lower eyelid hordeolum - nearly resolved Plan  -patient has an ophthalmologist Dr. 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 ?Worsened by anxiety and treatment. Now off steroids. Plan -ativan prn -f/u with neurology- referral given. -if not responding to ativan might need to consider alternative medications ?primidone, changing from keppra to alternative medication that might help tremors as well and does not interact with temodar. -Thyroid function tests on next visit.  4) Abnormal LFts - likely from temodar. Mild expect them to resolve in a couple of weeks.  -neurology referral in 1 week -RTC with Dr Irene Limbo in 3 weeks with labs  -All of the patients questions were answered with apparent satisfaction. The patient knows to call the clinic with any problems, questions or concerns.  I spent 30 minutes counseling the patient face to face. The total time spent in the appointment was 40 minutes and more than 50% was on counseling and direct patient cares and co-ordination or cares with neuro-surgery and radiation oncology and answering the family's several questions.    Sullivan Lone MD [place] AAHIVMS [facility] Hematology/Oncology Physician [facility]  (Office):       [phone] (Work cell):  [phone] (Fax):           [phone]

## 2017-02-23 ENCOUNTER — Encounter (HOSPITAL_COMMUNITY): Payer: Medicare Other

## 2017-02-23 ENCOUNTER — Other Ambulatory Visit: Payer: Self-pay | Admitting: Hematology

## 2017-02-23 ENCOUNTER — Telehealth: Payer: Self-pay | Admitting: *Deleted

## 2017-02-23 ENCOUNTER — Encounter (HOSPITAL_COMMUNITY): Payer: Medicare Other | Admitting: Physical Therapy

## 2017-02-23 ENCOUNTER — Other Ambulatory Visit: Payer: Self-pay | Admitting: *Deleted

## 2017-02-23 MED ORDER — TEMOZOLOMIDE 100 MG PO CAPS: 150.0000 mg/m2/d | ORAL_CAPSULE | Freq: Every day | ORAL | 0 refills | Status: DC

## 2017-02-23 MED ORDER — TEMOZOLOMIDE 100 MG PO CAPS: 150.0000 mg/m2/d | ORAL_CAPSULE | Freq: Every day | ORAL | 0 refills | Status: AC

## 2017-02-23 NOTE — Telephone Encounter (Signed)
Call from pt's daughter asking if Temodar script has been sent to pharmacy. YES. Instructions and pharmacy phone # reviewed. She voiced understanding.

## 2017-02-25 ENCOUNTER — Telehealth: Payer: Self-pay | Admitting: *Deleted

## 2017-02-25 ENCOUNTER — Encounter (HOSPITAL_COMMUNITY): Payer: Medicare Other | Admitting: Physical Therapy

## 2017-02-25 ENCOUNTER — Ambulatory Visit (HOSPITAL_COMMUNITY): Payer: Medicare Other | Attending: Neurosurgery | Admitting: Physical Therapy

## 2017-02-25 ENCOUNTER — Encounter (HOSPITAL_COMMUNITY): Payer: Medicare Other | Admitting: Specialist

## 2017-02-25 DIAGNOSIS — R2681 Unsteadiness on feet: Secondary | ICD-10-CM | POA: Insufficient documentation

## 2017-02-25 DIAGNOSIS — R278 Other lack of coordination: Secondary | ICD-10-CM

## 2017-02-25 DIAGNOSIS — M6281 Muscle weakness (generalized): Secondary | ICD-10-CM | POA: Diagnosis present

## 2017-02-25 NOTE — Telephone Encounter (Signed)
"  Did Dr.Kale order my mom's Temodar?  What is the pharmacy name and number?"  Provided [facility] phone number.  Order was sent eRx on 02-23-2017 around 9:15 am.  No further questions.

## 2017-02-25 NOTE — Therapy (Signed)
[place], [place], [address] Phone: [phone]   Fax:  [phone]  Physical Therapy Treatment (Re-ASsessment)  Patient Details  Name: Donna Valentine MRN: [id number] Date of Birth: Oct 05, 1941 Referring Provider: Ashok Pall   Encounter Date: 02/25/2017      PT End of Session - 02/25/17 1521    Visit Number 9   Number of Visits 21   Date for PT Re-Evaluation 03/18/17   Authorization Type [employer] (G-codes and KX); G-codes done 9th session    Authorization Time Period 12/25/92 to 5/85/92; recert done 4/9; recert done 9/24   Authorization - Visit Number 9   Authorization - Number of Visits 19   PT Start Time 4628  patient arrived late    PT Stop Time 1428   PT Time Calculation (min) 33 min   Equipment Utilized During Treatment Gait belt   Activity Tolerance Patient tolerated treatment well;Patient limited by fatigue   Behavior During Therapy [facility] for tasks assessed/performed      Past Medical History:  Diagnosis Date  . Brain cancer ([place])    [facility]  . Cancer [facility])     Past Surgical History:  Procedure Laterality Date  . APPENDECTOMY  1994  . APPLICATION OF CRANIAL NAVIGATION N/A 11/28/2016   Procedure: APPLICATION OF CRANIAL NAVIGATION;  Surgeon: Ashok Pall, MD;  Location: [place];  Service: Neurosurgery;  Laterality: N/A;  . CESAREAN SECTION     x3  . CRANIOTOMY Right 11/28/2016   Procedure: STERIOTACTIC PARIETAL CRANIOTOMY FOR RIGHT FRONTAL TUMOR RESECTION WITH BRAINLAB;  Surgeon: Ashok Pall, MD;  Location: [place];  Service: Neurosurgery;  Laterality: Right;    There were no vitals filed for this visit.      Subjective Assessment - 02/25/17 1358    Subjective Paitent arrives stating the cancer treatments have really taken a toll on her; her fatigue is up quite a bit and she is going to see a neurologist about the increased tremors in her hands, she can barely feed herself due to them at this  point. She can tell she has lost some strength and appears very fatigued in general, easily short of breath. She has completed the radiation but is starting chemo today.    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 No/denies            [facility] PT Assessment - 02/25/17 0001      Assessment   Medical Diagnosis glioblastoma    Referring Provider Ashok Pall    Onset Date/Surgical Date 11/20/16   Next MD Visit Dr. Christella Noa- has appointment but not sure when      Precautions   Precautions Other (comment)   Precaution Comments brain tumor, hx craniotomy, has finished radiation but is starting chemo      Balance Screen   Has the patient fallen in the past 6 months No   Has the patient had a decrease in activity level because of a fear of falling?  No   Is the patient reluctant to leave their home because of a fear of falling?  No     Prior Function   Level of Independence Independent   Vocation Retired     Strength   Right Hip Flexion 3+/5   Right Hip Extension 3-/5   Right Hip ABduction 3/5   Left Hip Flexion 3/5  Left Hip Extension 3-/5   Left Hip ABduction 3/5   Right Knee Extension 4/5   Left Knee Extension 4/5   Right Ankle Dorsiflexion 4+/5   Left Ankle Dorsiflexion 4+/5     Transfers   Five time sit to stand comments  15.42 no UEs      6 minute walk test results    Aerobic Endurance Distance Walked 534   Endurance additional comments 3MWT      Dynamic Gait Index   Level Surface Normal   Change in Gait Speed Normal   Gait with Horizontal Head Turns Normal   Gait with Vertical Head Turns Normal   Gait and Pivot Turn Moderate Impairment   Step Over Obstacle Mild Impairment   Step Around Obstacles Moderate Impairment   Steps Moderate Impairment   Total Score 17                             PT Education - 02/25/17 1521     Education provided Yes   Education Details neuropathy and balance deficits associated with cancer treatments, fatigue and importance of regular physical activity to manage; progressive walking program    Person(s) Educated Patient   Methods Explanation   Comprehension Verbalized understanding          PT Short Term Goals - 02/25/17 1523      PT SHORT TERM GOAL #1   Title Patient to be able to identify 5/5 safety factors/concepts in order to demonstrate good safety and reduced fall risk    Time 2   Period Weeks   Status Partially Met     PT SHORT TERM GOAL #2   Title Patient to be able to ambulate over 747f in 3MWT in order to demonstrate improved general mobility and community access    Baseline 5/16- significantly reduced    Time 2   Period Weeks   Status On-going     PT SHORT TERM GOAL #3   Title Patient to be independent in correctly and consistently performing targeted HEP, to be updated as appropriate    Baseline 5/16- has not been adherent due to fatigue/malaise with cancer treatments    Time 1   Period Weeks   Status On-going     PT SHORT TERM GOAL #4   Title Patient to be performing regular progressive walking program, at least 10-15 minutes in duration and 1-2 times per day, in order to assist in addressing fatigue and functional weakness    Time 3   Period Weeks   Status New           PT Long Term Goals - 02/25/17 1525      PT LONG TERM GOAL #1   Title Patient to demonstrate functional strength 5/5 in all tested muscle groups in order to improve overall gait and balance    Baseline 5/16- significant decline    Time 4   Period Weeks   Status On-going     PT LONG TERM GOAL #2   Title Patient to score 24 on DGI in order to show improved dynamic balance skills and overall reduced fall risk    Baseline 5/16- 17/24, signfiicant decline    Time 4   Period Weeks   Status On-going     PT LONG TERM GOAL #3   Title Patient to be participatory in regular  aerobic exercise program, at least 20 minutes in duration and 4 days per week,  in order to maintain functional gains and imrpove overall health status    Time 4   Period Weeks   Status On-going               Plan - Mar 17, 2017 1523    Clinical Impression Statement Re-assessment performed today. Patient arrives approximately one month of being out of skilled PT services due to fatigue and malaise related to radiation therapy for her tumor; she reports she has completed radiation and is starting a course of chemo today or tomorrow. Her fatigue and weakness has increased greatly and she reports great concern over increased tremors in her hands which are giving her trouble with eating and selfcare. Examination reveals significant functional weakness, reduced functional activity tolerance, and significant dynamic balance deficits. Discussed neuropathy and related balance deficits associated with cancer treatments, as well as fatigue and importance of regular physical activity including progressive walking program. Recommend skilled PT services as tolerated to address functional deficits moving forward.    Rehab Potential Good   Clinical Impairments Affecting Rehab Potential (+) high PLOF, very active at baseline, good recovery after craniotomy; (-) may be starting radiation/chemo soon/effects of these treatments on tolerance to rehab    PT Frequency 2x / week   PT Duration 6 weeks   PT Treatment/Interventions ADLs/Self Care Home Management;Biofeedback;Gait training;Stair training;Functional mobility training;Therapeutic activities;Therapeutic exercise;Balance training;Neuromuscular re-education;Patient/family education;Manual techniques;Energy conservation;Taping   PT Next Visit Plan focus on fucntional strength and dynamic balance with rest PRN due to fatigue. Continue to encourage regular progressive walking program on her own.    PT Home Exercise Plan Eval: sit to stand no UEs, seated clams with red  and green TB, SLS, tandem stance 03/18/2023: progressive walking program    Consulted and Agree with Plan of Care Patient      Patient will benefit from skilled therapeutic intervention in order to improve the following deficits and impairments:  Abnormal gait, Decreased coordination, Decreased mobility, Decreased activity tolerance, Decreased strength, Decreased balance, Difficulty walking  Visit Diagnosis: Other lack of coordination - Plan: PT plan of care cert/re-cert  Muscle weakness (generalized) - Plan: PT plan of care cert/re-cert  Unsteadiness on feet - Plan: PT plan of care cert/re-cert       G-Codes - March 17, 2017 1526    Functional Assessment Tool Used (Outpatient Only) Based on skilled clincial assessment of strenght, gait, dynamic balance, overall fall risk, functional activity tolerance    Functional Limitation Mobility: Walking and moving around   Mobility: Walking and Moving Around Current Status ([id number]) At least 40 percent but less than 60 percent impaired, limited or restricted   Mobility: Walking and Moving Around Goal Status [phone]) At least 20 percent but less than 40 percent impaired, limited or restricted      Problem List Patient Active Problem List   Diagnosis Date Noted  . Oral candida 01/08/2017  . Goals of care, counseling/discussion   . 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 Brain tumor ([place]) 11/28/2016  . 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 Left-sided weakness 11/20/2016  . 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 HYPERCHOLESTEROLEMIA, BORDERLINE 07/28/2008  . ANEMIA, MILD 07/28/2008  . DIVERTICULOSIS OF COLON 07/28/2008  . HEMATURIA, HX OF 07/28/2008  . COLONIC POLYPS 07/27/2008  . RIGHT BUNDLE BRANCH BLOCK 07/27/2008  . PREMATURE ATRIAL CONTRACTIONS 07/27/2008  . HEMORRHOIDS 07/27/2008  . VENOUS INSUFFICIENCY 07/27/2008  . BACK PAIN, LUMBAR 07/27/2008  . SCOLIOSIS 07/27/2008  .  PALPITATIONS,  HX OF 07/27/2008    Deniece Ree PT, DPT [place] [address] [place], [place], [address] Phone: [phone]   Fax:  [phone]  Name: Donna Valentine MRN: [id number] Date of Birth: 11-30-1940

## 2017-02-25 NOTE — Patient Instructions (Signed)
PROGRESSIVE WALKING PROGRAM  For the next 3 days, do your normal activities and use the pedometer to count how many steps you are taking.  On day 4, add steps.  Continue to progress.  For example:  Day 1: 2000 steps  Day 2: 2000 steps  Day 3: 2000 steps  Day 4: 2500-3000 steps  Etc

## 2017-02-27 ENCOUNTER — Encounter (HOSPITAL_COMMUNITY): Payer: Self-pay

## 2017-02-27 ENCOUNTER — Ambulatory Visit (HOSPITAL_COMMUNITY): Payer: Medicare Other

## 2017-02-27 DIAGNOSIS — R2681 Unsteadiness on feet: Secondary | ICD-10-CM

## 2017-02-27 DIAGNOSIS — M6281 Muscle weakness (generalized): Secondary | ICD-10-CM

## 2017-02-27 DIAGNOSIS — R278 Other lack of coordination: Secondary | ICD-10-CM

## 2017-02-27 NOTE — Progress Notes (Signed)
Subjective:   Donna Valentine was seen in consultation in the movement disorder clinic at the request of Dr. Irene Limbo.  Her PCP is Sharilyn Sites, MD.  The evaluation is for tremor.   Pt accompanied by her son in law and husband, who supplement the history.  Pt has a complex medical hx.  Pt has a hx of R frontal GBM, dx in 11/2016 with dominant mass resection 11/28/16.  Smaller satellite lesions remain. Pt had radiation and now on Temodar.  Has had 2nd opinion at [facility] and I reviewed those records as well as local records.  Pt went off of decadron but thought that tremor got worse and went back on it about 2 weeks ago when she went to [place].   She is only on 2 mg now.  Has never had a seizure but is on keppra for "prophylaxis."    She presents today for evaluation of tremor.  She states that she had a "little tremor" for years in the R hand and her mom had that as well.  It would come and go.  It didn't bother her that much, at least not like it does now.  It would bother her with fine motor coordination.  She noted an increase in tremor while she was getting radiation.  It was in both hands.  The L hand is now worse than the right.  It is at worse with use of the hands.    Affected by caffeine:  No. (1 soda per day) Affected by alcohol:  Doesn't drink any Affected by stress:  Yes.   Affected by fatigue:  A little Spills soup if on spoon:  Yes.   (won't go out in public and eat because of social embarrassment) Spills glass of liquid if full:  Yes.  , if very full  Affects ADL's (tying shoes, brushing teeth, etc):  No.  Current/Previously tried tremor medications: n/a  Current medications that may exacerbate tremor:     Reviewed most recent MRI brain with and without gad as well as presenting MRI brain from 11/2016.    02/2017 MRI demonstrated:   1. Mildly larger area of enhancement at the treatment site in the right superior frontal gyrus, contiguous with mild dural thickening underlying the  craniotomy. Regional parenchymal T2 and FLAIR hyperintensity has mildly regressed. Considering the patient has recently completed postoperative radiation, this study will serve as a new post treatment baseline. 2. Unusual appearance of small volume fluid in the subarachnoid spaces just posterior to the treatment site. This might represent a trace amount of subarachnoid hemorrhage, but is isointense to CSF except on FLAIR. No definite leptomeningeal enhancement, and no associated mass effect. Attention directed on followup. 3. No other acute intracranial abnormality.  Outside reports reviewed: historical medical records, lab reports, operative reports and radiology reports.  Allergies  Allergen Reactions  . Penicillins Other (See Comments)    Hallucination Has patient had a PCN reaction causing immediate rash, facial/tongue/throat swelling, SOB or lightheadedness with hypotension: No Has patient had a PCN reaction causing severe rash involving mucus membranes or skin necrosis: No Has patient had a PCN reaction that required hospitalization No Has patient had a PCN reaction occurring within the last 10 years: No If all of the above answers are "NO", then may proceed with Cephalosporin use.   . Food     Pineapple-Nausea/vomiting    Outpatient Encounter Prescriptions as of 03/04/2017  Medication Sig  . dexamethasone (DECADRON) 4 MG tablet Take by mouth.  Marland Kitchen  levETIRAcetam (KEPPRA) 500 MG tablet Take 1 tablet (500 mg total) by mouth 2 (two) times daily.  . ondansetron (ZOFRAN) 8 MG tablet Take 1 tablet (8 mg total) by mouth every 8 (eight) hours as needed for nausea.  . [EXPIRED] temozolomide (TEMODAR) 100 MG capsule Take 3 capsules (300 mg total) by mouth daily. For 5 days every 28 days. May take on an empty stomach or at bedtime to decrease nausea.  . [DISCONTINUED] LORazepam (ATIVAN) 0.5 MG tablet Take 1 tablet (0.5 mg total) by mouth every 8 (eight) hours as needed for anxiety (tremors).   . [DISCONTINUED] nystatin (MYCOSTATIN) 100000 UNIT/ML suspension Take 5 mLs (500,000 Units total) by mouth 4 (four) times daily. Swish in mouth for 20 seconds and then swallow or spit  . [DISCONTINUED] sulfamethoxazole-trimethoprim (BACTRIM DS,SEPTRA DS) 800-160 MG tablet Take 1 tablet by mouth 3 (three) times a week.   No facility-administered encounter medications on file as of 03/04/2017.     Past Medical History:  Diagnosis Date  . GBM (glioblastoma multiforme) ([place])     Past Surgical History:  Procedure Laterality Date  . APPENDECTOMY  1994  . APPLICATION OF CRANIAL NAVIGATION N/A 11/28/2016   Procedure: APPLICATION OF CRANIAL NAVIGATION;  Surgeon: Ashok Pall, MD;  Location: [place];  Service: Neurosurgery;  Laterality: N/A;  . CESAREAN SECTION     x3  . CRANIOTOMY Right 11/28/2016   Procedure: STERIOTACTIC PARIETAL CRANIOTOMY FOR RIGHT FRONTAL TUMOR RESECTION WITH BRAINLAB;  Surgeon: Ashok Pall, MD;  Location: [place];  Service: Neurosurgery;  Laterality: Right;    Social History   Social History  . Marital status: Married    Spouse name: N/A  . Number of children: N/A  . Years of education: N/A   Occupational History  . Not on file.   Social History Main Topics  . Smoking status: Former Smoker    Quit date: 11/26/1991  . Smokeless tobacco: Never Used  . Alcohol use No  . Drug use: No  . Sexual activity: Not Currently   Other Topics Concern  . Not on file   Social History Narrative  . No narrative on file    Family Status  Relation Status  . Mother Deceased  . Father Deceased  . Sister Alive  . Brother Alive  . Brother Deceased  . Sister Deceased  . Daughter Alive  . Neg Hx (Not Specified)    Review of Systems A complete 10 system ROS was obtained and was negative apart from what is mentioned.   Objective:   VITALS:   Vitals:   03/04/17 1029  BP: 128/80  Pulse: 66  SpO2: 94%  Weight: 187 lb (84.8 kg)  Height: 5\' 6"  (1.676 m)   Gen:   Appears stated age and in NAD. HEENT:  Normocephalic, atraumatic. The mucous membranes are moist. The superficial temporal arteries are without ropiness or tenderness. Cardiovascular: Regular rate and rhythm. Lungs: Clear to auscultation bilaterally. Neck: There are no carotid bruits noted bilaterally.  NEUROLOGICAL:  Orientation:  The patient is alert and oriented x 3.  Recent and remote memory are intact.  Attention span and concentration are normal.  Able to name objects and repeat without trouble.  Fund of knowledge is appropriate Cranial nerves: There is good facial symmetry. The pupils are equal round and reactive to light bilaterally. Fundoscopic exam reveals clear disc margins bilaterally. Extraocular muscles are intact and visual fields are full to confrontational testing. Speech is fluent and clear. Soft palate rises symmetrically  and there is no tongue deviation. Hearing is intact to conversational tone. Tone: Tone is good throughout. Sensation: Sensation is intact to light touch and pinprick throughout (facial, trunk, extremities). Vibration is intact at the bilateral big toe. There is no extinction with double simultaneous stimulation. There is no sensory dermatomal level identified. Coordination:  The patient has no dysdiadichokinesia or dysmetria. Motor: Strength is 5/5 in the bilateral upper and lower extremities.  Shoulder shrug is equal bilaterally.  There is no pronator drift.  There are no fasciculations noted. DTR's: Deep tendon reflexes are 2/4 at the bilateral biceps, triceps, brachioradialis, 1/4 at the bilateral patella and achilles.  Plantar responses are downgoing bilaterally. Gait and Station: The patient is able to ambulate without difficulty. The patient is able to heel toe walk without any difficulty. The patient is able to stand in the Romberg position.   MOVEMENT EXAM: Tremor:  There is tremor in the UE, noted most significantly with action.  The L is worse than the  right.  It is moderate on the L.  she has significant difficulty getting the pen to the paper on the left.  She is able to draw Archimedes spirals on the right.  She is able to pour water from one glass to another, but tremor is evident on both sides when she pours the water and she does spill some.    LABS:    Chemistry      Component Value Date/Time   NA 143 02/17/2017 0816   K 3.9 02/17/2017 0816   CL 104 11/20/2016 1706   CO2 27 02/17/2017 0816   BUN 10.7 02/17/2017 0816   CREATININE 1.0 02/17/2017 0816      Component Value Date/Time   CALCIUM 9.3 02/17/2017 0816   ALKPHOS 62 02/17/2017 0816   AST 69 (H) 02/17/2017 0816   ALT 70 (H) 02/17/2017 0816   BILITOT 0.58 02/17/2017 0816          Assessment/Plan:   1.  Essential Tremor.  -She did have some tremor on the right along prior to her diagnosis of glioblastoma, and had a family history of tremor.  This sounded most consistent with essential tremor.  She then had a great increase in tremor with radiation for her glioblastoma.  Most of his tremor that picked up was on the left.  Based on her MRI and history, I suspect that this increase was due to radiation.  She is still on a bit of Decadron, which can also increase tremor, but I suspect that she is going to be left with a significant amount of tremor.  She and I discussed this today.  We discussed that while medications can help, and likely will not get rid of this degree of tremor.  She is likely not a candidate for surgical intervention for tremor.  Her son-in-law has noted that just a little bit of weight on the hand will help, and we talked about the value of weighted gloves.  He would like her to try those first, and she was agreeable.  They're going to try both the half pound weighted gloves and the 1 pound weighted fingerless gloves and see if this helps enough.  If not, I told her we could try primidone.  We will have to start a very low dosages to get her used to the  medication, which likely will take several months before we can increase the dose.  I don't think that this would negatively affect her LFT's at such  a low dose.  They are slightly high and being monitored with temodar.  Her husband asked me about alcohol helping tremor.  We discussed the fact that alcohol can decrease tremor, although I do not necessarily recommend this as part of a treatment regimen.  -would continue Keppra.  Don't think that this is affecting tremor.  Is just being used preventatively.  No hx of seizure.  2.  Will call the patient a few weeks to see how she is doing.  We'll determine follow-up based on that.  Greater than 50% of the 60 minute visit was spent in counseling with the patient.  This did not include the 45 min in record review which was non face to face time.    CC:  Sharilyn Sites, MD

## 2017-02-27 NOTE — Therapy (Signed)
[place], [place], [address] Phone: [phone]   Fax:  [phone]  Occupational Therapy Treatment  Patient Details  Name: Donna Valentine MRN: [id number] Date of Birth: 09-16-1941 Referring Provider: Dr. Ashok Pall  Encounter Date: 02/27/2017      OT End of Session - 02/27/17 1537    Visit Number 8   Number of Visits 12   Date for OT Re-Evaluation 03/29/17   Authorization Type U[employer] Medicare   Authorization Time Period before 18th visit   Authorization - Visit Number 8   Authorization - Number of Visits 18   OT Start Time 1350   OT Stop Time 1430   OT Time Calculation (min) 40 min   Activity Tolerance Patient tolerated treatment well   Behavior During Therapy [facility] for tasks assessed/performed      Past Medical History:  Diagnosis Date  . Brain cancer ([place])    [facility]  . Cancer [facility])     Past Surgical History:  Procedure Laterality Date  . APPENDECTOMY  1994  . APPLICATION OF CRANIAL NAVIGATION N/A 11/28/2016   Procedure: APPLICATION OF CRANIAL NAVIGATION;  Surgeon: Ashok Pall, MD;  Location: [place];  Service: Neurosurgery;  Laterality: N/A;  . CESAREAN SECTION     x3  . CRANIOTOMY Right 11/28/2016   Procedure: STERIOTACTIC PARIETAL CRANIOTOMY FOR RIGHT FRONTAL TUMOR RESECTION WITH BRAINLAB;  Surgeon: Ashok Pall, MD;  Location: [place];  Service: Neurosurgery;  Laterality: Right;    There were no vitals filed for this visit.      Subjective Assessment - 02/27/17 1354    Subjective  S: The tremors are much worse now.   Currently in Pain? 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   Onset Date 11/28/16     Precautions   Precautions Other (comment)   Precaution Comments brain tumor, hx craniotomy, on active radiation      Prior Function   Level of Independence Independent    Vocation Retired     Artist 9 Hole Peg Test 30"  Previous: 24.93"   Left 9 Hole Peg Test 1'15"  only able to place 3 pegs in previous: 36.28"     AROM   Left Shoulder Flexion 130 Degrees  previous: 144   Left Shoulder ABduction 125 Degrees  previous 132   Left Shoulder Internal Rotation 55 Degrees  previous 70   Left Shoulder External Rotation 75 Degrees  previous 72     Strength   Strength Assessment Site Shoulder;Elbow;Hand   Right/Left Shoulder Right;Left   Right Shoulder Flexion 4+/5   Right Shoulder ABduction 4+/5   Right Shoulder Internal Rotation 4/5   Right Shoulder External Rotation 4/5   Left Shoulder Flexion 4-/5  previous: 4+/5   Left Shoulder ABduction 4-/5  previous: 4/5   Left Shoulder Internal Rotation 4-/5  previous: 4+/5   Left Shoulder External Rotation 4-/5  previous: 4/5   Left Elbow Flexion 4-/5  previous: 4-/5   Left Elbow Extension 4-/5  previous: 4/5   Right/Left hand Left;Right   Right Hand Grip (lbs) 45   Right Hand Lateral Pinch 14 lbs   Right Hand 3 Point Pinch 15 lbs   Left Hand Grip (lbs) 40  previous: 40   Left Hand Lateral Pinch 10  lbs  previous: 9   Left Hand 3 Point Pinch 10 lbs  previous: 11                  OT Treatments/Exercises (OP) - 02/27/17 1419      Exercises   Exercises Shoulder     Shoulder Exercises: Seated   Protraction Theraband;10 reps   Theraband Level (Shoulder Protraction) Level 2 (Red)   Horizontal ABduction Theraband;10 reps   Theraband Level (Shoulder Horizontal ABduction) Level 2 (Red)   External Rotation Theraband;10 reps   Theraband Level (Shoulder External Rotation) Level 2 (Red)   Internal Rotation Theraband;10 reps   Theraband Level (Shoulder Internal Rotation) Level 2 (Red)   Flexion 10 reps;Theraband   Theraband Level (Shoulder Flexion) Level 2 (Red)   Abduction 10 reps;Theraband   Theraband Level (Shoulder ABduction) Level 2 (Red)   Other Seated Exercises  Shoulder strengthening completed with red therband through horizontal adduction and extension 10X bilaterally.              Balance Exercises - 02/27/17 1339      Balance Exercises: Standing   Tandem Stance Eyes open;3 reps;30 secs;Foam/compliant surface   Sidestepping 2 reps           OT Education - 02/27/17 1435    Education provided Yes   Education Details Red theraband shoulder strengthening   Person(s) Educated Patient   Methods Explanation;Demonstration;Handout;Verbal cues   Comprehension Returned demonstration;Verbalized understanding          OT Short Term Goals - 02/27/17 1538      OT SHORT TERM GOAL #1   Title Patient will be educated and independent with an HEP for improved functional use of LUE with all daily tasks.    Time 6   Period Weeks   Status On-going     OT SHORT TERM GOAL #2   Title Patient will return to prior level of function, using her left upper extremity actively with all tasks.    Time 6   Period Weeks   Status On-going     OT SHORT TERM GOAL #3   Title Patient will improve left shoulder A/ROM to WNL for improved ability to reach into overhead cabinets.    Time 6   Period Weeks   Status On-going     OT SHORT TERM GOAL #4   Title Patient will improve left upper extremity strength to 4+/5 for improved ability to lift shopping bags.    Time 6   Period Weeks   Status Partially Met     OT SHORT TERM GOAL #5   Title Patient will improve left grip strength by 10 pounds and pinch strength by 6 pounds or more for improved ability to open containers and maintain grip on objects.     Time 6   Period Weeks   Status On-going     OT SHORT TERM GOAL #6   Title Patient will improve fine motor coordination needed to fasten jewelery by decreasing completion time on nine hole peg test by 10 seconds.    Time 6   Period Weeks   Status On-going     OT SHORT TERM GOAL #7   Title Patient will decrease non purposeful tremors from moderate to  minimal for improved abilty to complete daily tasks safely.    Baseline 5/18: Goal deferred at this time due to unknown source of tremors.   Time 6   Period Weeks   Status Deferred  Plan - 02/27/17 1539    Clinical Impression Statement A: Reassessment completed this date as patient has been off therapy caseload for a month. Patient presents with increased tremors in bilateral UEs; L>R. Unable to complete 9 hole peg test with left hand due to tremors and frustration level. Grip and pinch strength have remain constant. UE strength slightly decreased. Pt reports that she feels weak bilaterally although left is weaker than right. No goals have been met at this time. 1 goal related to tremors has been deferred. Patient reports that she has difficulty eating and handwriting due to tremors.   Plan P: Continue skilled OT services focusing on grip and pinch strength and BUE strength. Hold coordination tasks at this time due to severity of tremors.       Patient will benefit from skilled therapeutic intervention in order to improve the following deficits and impairments:  Decreased coordination, Decreased range of motion, Decreased strength, Impaired UE functional use  Visit Diagnosis: Other lack of coordination - Plan: Ot plan of care cert/re-cert  Muscle weakness (generalized) - Plan: Ot plan of care cert/re-cert      G-Codes - 37/95/58 1543    Functional Assessment Tool Used (Outpatient only) clinical judgement/observation   Functional Limitation Carrying, moving and handling objects   Carrying, Moving and Handling Objects Current Status [phone]) At least 60 percent but less than 80 percent impaired, limited or restricted   Carrying, Moving and Handling Objects Goal Status (A5525) At least 1 percent but less than 20 percent impaired, limited or restricted      Problem List Patient Active Problem List   Diagnosis Date Noted  . Oral candida 01/08/2017  . Goals of  care, counseling/discussion   . Glioblastoma multiforme of frontal lobe ([place]) 12/01/2016  . Brain tumor ([place]) 11/28/2016  . 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 Left-sided weakness 11/20/2016  . 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 HYPERCHOLESTEROLEMIA, BORDERLINE 07/28/2008  . ANEMIA, MILD 07/28/2008  . DIVERTICULOSIS OF COLON 07/28/2008  . HEMATURIA, HX OF 07/28/2008  . COLONIC POLYPS 07/27/2008  . RIGHT BUNDLE BRANCH BLOCK 07/27/2008  . PREMATURE ATRIAL CONTRACTIONS 07/27/2008  . HEMORRHOIDS 07/27/2008  . VENOUS INSUFFICIENCY 07/27/2008  . BACK PAIN, LUMBAR 07/27/2008  . SCOLIOSIS 07/27/2008  . PALPITATIONS, HX OF 07/27/2008   [name], OTR/L,CBIS  [phone]  02/27/2017, 3:46 PM  [place] [address] [place], [place], [address] Phone: [phone]   Fax:  [phone]  Name: Donna Valentine MRN: [id number] Date of Birth: Jan 23, 1941

## 2017-02-27 NOTE — Patient Instructions (Signed)
Strengthening: Chest Pull - Resisted   Hold Theraband in front of body with hands about shoulder width a part. Pull band a part and back together slowly. Repeat _10___ times. Complete ____ set(s) per session.. Repeat ____ session(s) per day.  http://orth.exer.us/926   Copyright  VHI. All rights reserved.   PNF Strengthening: Resisted   Standing with resistive band around each hand, bring right arm up and away, thumb back. Repeat _10___ times per set. Do ____ sets per session. Do ____ sessions per day.            Resisted External Rotation: in Neutral - Bilateral   Sit or stand, tubing in both hands, elbows at sides, bent to 90, forearms forward. Pinch shoulder blades together and rotate forearms out. Keep elbows at sides. Repeat _10___ times per set. Do ____ sets per session. Do ____ sessions per day.  http://orth.exer.us/966   Copyright  VHI. All rights reserved.   PNF Strengthening: Resisted   Standing, hold resistive band above head. Bring right arm down and out from side. Repeat _10___ times per set. Do ____ sets per session. Do ____ sessions per day.  http://orth.exer.us/922   Copyright  VHI. All rights reserved.

## 2017-02-27 NOTE — Therapy (Signed)
[place], [place], [address] Phone: [phone]   Fax:  [phone]  Physical Therapy Treatment  Patient Details  Name: Donna Valentine MRN: [id number] Date of Birth: 10/13/1941 Referring Provider: Ashok Pall   Encounter Date: 02/27/2017      PT End of Session - 02/27/17 1303    Visit Number 10   Number of Visits 21   Date for PT Re-Evaluation 03/18/17   Authorization Type UHC Medicare (G-codes and KX); G-codes done 9th session    Authorization Time Period 5/40/08 to 6/76/19; recert done 4/9; recert done 5/09   Authorization - Visit Number 10   Authorization - Number of Visits 19   PT Start Time 1301   PT Stop Time 3267  EOS on Nustep x 8 min, not included with charges   PT Time Calculation (min) 46 min   Equipment Utilized During Treatment Gait belt   Activity Tolerance Patient tolerated treatment well;Patient limited by fatigue   Behavior During Therapy [facility] for tasks assessed/performed      Past Medical History:  Diagnosis Date  . Brain cancer ([place])    [facility]  . Cancer [facility])     Past Surgical History:  Procedure Laterality Date  . APPENDECTOMY  1994  . APPLICATION OF CRANIAL NAVIGATION N/A 11/28/2016   Procedure: APPLICATION OF CRANIAL NAVIGATION;  Surgeon: Ashok Pall, MD;  Location: [place];  Service: Neurosurgery;  Laterality: N/A;  . CESAREAN SECTION     x3  . CRANIOTOMY Right 11/28/2016   Procedure: STERIOTACTIC PARIETAL CRANIOTOMY FOR RIGHT FRONTAL TUMOR RESECTION WITH BRAINLAB;  Surgeon: Ashok Pall, MD;  Location: [place];  Service: Neurosurgery;  Laterality: Right;    There were no vitals filed for this visit.      Subjective Assessment - 02/27/17 1253    Subjective Pt reports she is going to neuroglist next week to discuss increased tremors in her hands.  No reports of pain, continues to feel weak and fatigue.   Pertinent History glioblastoma of R frontal lobe, craniotomy done on  11/28/16   Patient Stated Goals improve strength, enhance independence    Currently in Pain? No/denies             [facility] Adult PT Treatment/Exercise - 02/27/17 0001      Knee/Hip Exercises: Aerobic   Nustep Nustep hills 2 level 3 x8 minutes, EOS not included in billing     Knee/Hip Exercises: Standing   Heel Raises Both;2 sets;10 reps   Heel Raises Limitations Heel and toe raises   Hip Flexion Both;10 reps;2 sets;Knee bent   Hip Flexion Limitations Marching on Foam 2 sets x 10 reps 5" holds   Hip ADduction 2 sets;10 reps   Other Standing Knee Exercises sidestep 2RT down 22f hallway   Other Standing Knee Exercises tandem stance 3x 30" on foam     Knee/Hip Exercises: Seated   Sit to Sand 10 reps;without UE support  low mat height no HHA             Balance Exercises - 02/27/17 1339      Balance Exercises: Standing   Tandem Stance Eyes open;3 reps;30 secs;Foam/compliant surface   Sidestepping 2 reps           PT Education - 02/27/17 1338    Education provided Yes   Education Details Given and educated purpose of STAR booklet   Person(s) Educated Patient   Methods Explanation;Handout   Comprehension Verbalized understanding  PT Short Term Goals - 02/25/17 1523      PT SHORT TERM GOAL #1   Title Patient to be able to identify 5/5 safety factors/concepts in order to demonstrate good safety and reduced fall risk    Time 2   Period Weeks   Status Partially Met     PT SHORT TERM GOAL #2   Title Patient to be able to ambulate over 788f in 3MWT in order to demonstrate improved general mobility and community access    Baseline 5/16- significantly reduced    Time 2   Period Weeks   Status On-going     PT SHORT TERM GOAL #3   Title Patient to be independent in correctly and consistently performing targeted HEP, to be updated as appropriate    Baseline 5/16- has not been adherent due to fatigue/malaise with cancer treatments    Time 1   Period  Weeks   Status On-going     PT SHORT TERM GOAL #4   Title Patient to be performing regular progressive walking program, at least 10-15 minutes in duration and 1-2 times per day, in order to assist in addressing fatigue and functional weakness    Time 3   Period Weeks   Status New           PT Long Term Goals - 02/25/17 1525      PT LONG TERM GOAL #1   Title Patient to demonstrate functional strength 5/5 in all tested muscle groups in order to improve overall gait and balance    Baseline 5/16- significant decline    Time 4   Period Weeks   Status On-going     PT LONG TERM GOAL #2   Title Patient to score 24 on DGI in order to show improved dynamic balance skills and overall reduced fall risk    Baseline 5/16- 17/24, signfiicant decline    Time 4   Period Weeks   Status On-going     PT LONG TERM GOAL #3   Title Patient to be participatory in regular aerobic exercise program, at least 20 minutes in duration and 4 days per week, in order to maintain functional gains and imrpove overall health status    Time 4   Period Weeks   Status On-going               Plan - 02/27/17 1308    Clinical Impression Statement Began session with education STAR booklet (given today) to improve activity tolerance and documentation for walking program, pt verbally understood.  Session foucs on general LE strengthening with rest breaks provided through session per fatigue.  Pt able to complete all therex with proper form with minimal verbal and tactile cueing for appropriate musculature activation.  No reports of pian through session, average fatigue level at 4/10 through session.     Rehab Potential Good   Clinical Impairments Affecting Rehab Potential (+) high PLOF, very active at baseline, good recovery after craniotomy; (-) may be starting radiation/chemo soon/effects of these treatments on tolerance to rehab    PT Frequency 2x / week   PT Duration 6 weeks   PT Treatment/Interventions  ADLs/Self Care Home Management;Biofeedback;Gait training;Stair training;Functional mobility training;Therapeutic activities;Therapeutic exercise;Balance training;Neuromuscular re-education;Patient/family education;Manual techniques;Energy conservation;Taping   PT Next Visit Plan focus on fucntional strength and dynamic balance with rest PRN due to fatigue. Continue to encourage regular progressive walking program on her own.    PT Home Exercise Plan Eval: sit to stand no UEs,  seated clams with red and green TB, SLS, tandem stance 5/16: progressive walking program; 5/18: Given STAR booklet for education for fatgiue and documentation of walking program      Patient will benefit from skilled therapeutic intervention in order to improve the following deficits and impairments:  Abnormal gait, Decreased coordination, Decreased mobility, Decreased activity tolerance, Decreased strength, Decreased balance, Difficulty walking  Visit Diagnosis: Other lack of coordination  Muscle weakness (generalized)  Unsteadiness on feet     Problem List Patient Active Problem List   Diagnosis Date Noted  . Oral candida 01/08/2017  . Goals of care, counseling/discussion   . Glioblastoma multiforme of frontal lobe ([place]) 12/01/2016  . Brain tumor ([place]) 11/28/2016  . 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 Left-sided weakness 11/20/2016  . 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 HYPERCHOLESTEROLEMIA, BORDERLINE 07/28/2008  . ANEMIA, MILD 07/28/2008  . DIVERTICULOSIS OF COLON 07/28/2008  . HEMATURIA, HX OF 07/28/2008  . COLONIC POLYPS 07/27/2008  . RIGHT BUNDLE BRANCH BLOCK 07/27/2008  . PREMATURE ATRIAL CONTRACTIONS 07/27/2008  . HEMORRHOIDS 07/27/2008  . VENOUS INSUFFICIENCY 07/27/2008  . BACK PAIN, LUMBAR 07/27/2008  . SCOLIOSIS 07/27/2008  . PALPITATIONS, HX OF 07/27/2008   Ihor Austin, [place]; [place]  Aldona Lento 02/27/2017, 1:41  PM  [place], [place], [address] Phone: [phone]   Fax:  [phone]  Name: Donna Valentine MRN: [id number] Date of Birth: 1941-06-07

## 2017-03-02 ENCOUNTER — Encounter (HOSPITAL_COMMUNITY): Payer: Medicare Other

## 2017-03-02 ENCOUNTER — Encounter (HOSPITAL_COMMUNITY): Payer: Medicare Other | Admitting: Physical Therapy

## 2017-03-03 ENCOUNTER — Telehealth: Payer: Self-pay | Admitting: Hematology

## 2017-03-03 ENCOUNTER — Ambulatory Visit (HOSPITAL_COMMUNITY): Payer: Medicare Other | Admitting: Physical Therapy

## 2017-03-03 DIAGNOSIS — R278 Other lack of coordination: Secondary | ICD-10-CM

## 2017-03-03 DIAGNOSIS — M6281 Muscle weakness (generalized): Secondary | ICD-10-CM

## 2017-03-03 DIAGNOSIS — R2681 Unsteadiness on feet: Secondary | ICD-10-CM

## 2017-03-03 NOTE — Therapy (Signed)
[place], [place], [address] Phone: [phone]   Fax:  [phone]  Physical Therapy Treatment  Patient Details  Name: Donna Valentine MRN: [id number] Date of Birth: 1941-09-15 Referring Provider: Ashok Pall   Encounter Date: 03/03/2017      PT End of Session - 03/03/17 1656    Visit Number 11   Number of Visits 21   Date for PT Re-Evaluation 03/18/17   Authorization Type U[employer] Medicare (G-codes and KX); G-codes done 9th session    Authorization Time Period 01/24/23 to 01/11/01; recert done 4/9; recert done 7/25   Authorization - Visit Number 11   Authorization - Number of Visits 19   PT Start Time 1310  Nustep not included in billing    PT Stop Time 1340   PT Time Calculation (min) 30 min   Equipment Utilized During Treatment Gait belt   Activity Tolerance Patient tolerated treatment well   Behavior During Therapy [facility] for tasks assessed/performed      Past Medical History:  Diagnosis Date  . Brain cancer ([place])    [facility]  . [facility])     Past Surgical History:  Procedure Laterality Date  . APPENDECTOMY  1994  . APPLICATION OF CRANIAL NAVIGATION N/A 11/28/2016   Procedure: APPLICATION OF CRANIAL NAVIGATION;  Surgeon: Ashok Pall, MD;  Location: [place];  Service: Neurosurgery;  Laterality: N/A;  . CESAREAN SECTION     x3  . CRANIOTOMY Right 11/28/2016   Procedure: STERIOTACTIC PARIETAL CRANIOTOMY FOR RIGHT FRONTAL TUMOR RESECTION WITH BRAINLAB;  Surgeon: Ashok Pall, MD;  Location: [place];  Service: Neurosurgery;  Laterality: Right;    There were no vitals filed for this visit.      Subjective Assessment - 03/03/17 1308    Subjective Patient arrives today reporting she is doing well, she is now taking 5 days on-23 days off schedule with chemo. Her daughter is done with school for the summer soon and is going to work with her on the walking program.    Pertinent History glioblastoma of R frontal  lobe, craniotomy done on 11/28/16   Patient Stated Goals improve strength, enhance independence    Currently in Pain? No/denies                         [facility] Adult PT Treatment/Exercise - 03/03/17 0001      Knee/Hip Exercises: Aerobic   Nustep Nustep hills 2 level 3 x8 minutes   not included in billing      Knee/Hip Exercises: Standing   Heel Raises Both;1 set;15 reps   Heel Raises Limitations heel and toe    Lateral Step Up Both;1 set;10 reps   Lateral Step Up Limitations 4 inch box    Forward Step Up Both;1 set;10 reps   Forward Step Up Limitations 4 inch box    Functional Squat 1 set;10 reps   Functional Squat Limitations facing chair for form    Other Standing Knee Exercises lateral stepping approx 45f x4              Balance Exercises - 03/03/17 1330      Balance Exercises: Standing   Tandem Stance Eyes open;3 reps;20 secs;Foam/compliant surface   Other Standing Exercises cone taps in semi-circle progressive combinations and difficulty (solid surface)           PT Education - 03/03/17 1656    Education provided Yes   Education Details  peripheral numbness associated with cancer treatments/effect on balance, importance of regular activity inculding walking program    Person(s) Educated Patient   Methods Explanation   Comprehension Verbalized understanding          PT Short Term Goals - 02/25/17 1523      PT SHORT TERM GOAL #1   Title Patient to be able to identify 5/5 safety factors/concepts in order to demonstrate good safety and reduced fall risk    Time 2   Period Weeks   Status Partially Met     PT SHORT TERM GOAL #2   Title Patient to be able to ambulate over 765f in 3MWT in order to demonstrate improved general mobility and community access    Baseline 5/16- significantly reduced    Time 2   Period Weeks   Status On-going     PT SHORT TERM GOAL #3   Title Patient to be independent in correctly and consistently performing  targeted HEP, to be updated as appropriate    Baseline 5/16- has not been adherent due to fatigue/malaise with cancer treatments    Time 1   Period Weeks   Status On-going     PT SHORT TERM GOAL #4   Title Patient to be performing regular progressive walking program, at least 10-15 minutes in duration and 1-2 times per day, in order to assist in addressing fatigue and functional weakness    Time 3   Period Weeks   Status New           PT Long Term Goals - 02/25/17 1525      PT LONG TERM GOAL #1   Title Patient to demonstrate functional strength 5/5 in all tested muscle groups in order to improve overall gait and balance    Baseline 5/16- significant decline    Time 4   Period Weeks   Status On-going     PT LONG TERM GOAL #2   Title Patient to score 24 on DGI in order to show improved dynamic balance skills and overall reduced fall risk    Baseline 5/16- 17/24, signfiicant decline    Time 4   Period Weeks   Status On-going     PT LONG TERM GOAL #3   Title Patient to be participatory in regular aerobic exercise program, at least 20 minutes in duration and 4 days per week, in order to maintain functional gains and imrpove overall health status    Time 4   Period Weeks   Status On-going               Plan - 03/03/17 1657    Clinical Impression Statement Patient arrives today feeling well and fatigue scale around 4-5/10. Continued working on general functional strengthening as well as balance today. Continued to also educate patient regarding importance of graded activity as tolerated in presence of cancer/chemo/radiation therapies in order to maintain function and prevent debilitating fatigue in general. Patient remains motivated to continue with skilled PT services moving forward.    Rehab Potential Good   Clinical Impairments Affecting Rehab Potential (+) high PLOF, very active at baseline, good recovery after craniotomy; (-) may be starting radiation/chemo  soon/effects of these treatments on tolerance to rehab    PT Frequency 2x / week   PT Duration 6 weeks   PT Treatment/Interventions ADLs/Self Care Home Management;Biofeedback;Gait training;Stair training;Functional mobility training;Therapeutic activities;Therapeutic exercise;Balance training;Neuromuscular re-education;Patient/family education;Manual techniques;Energy conservation;Taping   PT Next Visit Plan continue progressing Nustep, continue progrsesing functional strength/balance.  Continue to encourage regular progrsesive walking program using STAR book for tracking.    PT Home Exercise Plan Eval: sit to stand no UEs, seated clams with red and green TB, SLS, tandem stance 5/16: progressive walking program; 5/18: Given STAR booklet for education for fatgiue and documentation of walking program   Consulted and Agree with Plan of Care Patient      Patient will benefit from skilled therapeutic intervention in order to improve the following deficits and impairments:  Abnormal gait, Decreased coordination, Decreased mobility, Decreased activity tolerance, Decreased strength, Decreased balance, Difficulty walking  Visit Diagnosis: Other lack of coordination  Muscle weakness (generalized)  Unsteadiness on feet     Problem List Patient Active Problem List   Diagnosis Date Noted  . Oral candida 01/08/2017  . Goals of care, counseling/discussion   . 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 Brain tumor ([place]) 11/28/2016  . 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 Left-sided weakness 11/20/2016  . 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 HYPERCHOLESTEROLEMIA, BORDERLINE 07/28/2008  . ANEMIA, MILD 07/28/2008  . DIVERTICULOSIS OF COLON 07/28/2008  . HEMATURIA, HX OF 07/28/2008  . COLONIC POLYPS 07/27/2008  . RIGHT BUNDLE BRANCH BLOCK 07/27/2008  . PREMATURE ATRIAL CONTRACTIONS 07/27/2008  . HEMORRHOIDS 07/27/2008  . VENOUS  INSUFFICIENCY 07/27/2008  . BACK PAIN, LUMBAR 07/27/2008  . SCOLIOSIS 07/27/2008  . PALPITATIONS, HX OF 07/27/2008    Deniece Ree PT, DPT [place] [address] [place], [place], [address] Phone: [phone]   Fax:  [phone]  Name: Dyanna Seiter MRN: [id number] Date of Birth: 1940-12-09

## 2017-03-03 NOTE — Telephone Encounter (Signed)
Spoke with patient and confirmed appointment.

## 2017-03-04 ENCOUNTER — Ambulatory Visit (INDEPENDENT_AMBULATORY_CARE_PROVIDER_SITE_OTHER): Payer: Medicare Other | Admitting: Neurology

## 2017-03-04 ENCOUNTER — Encounter (HOSPITAL_COMMUNITY): Payer: Medicare Other | Admitting: Physical Therapy

## 2017-03-04 ENCOUNTER — Encounter (HOSPITAL_COMMUNITY): Payer: Medicare Other | Admitting: Specialist

## 2017-03-04 ENCOUNTER — Encounter: Payer: Self-pay | Admitting: Neurology

## 2017-03-04 VITALS — BP 128/80 | HR 66 | Ht 66.0 in | Wt 187.0 lb

## 2017-03-04 DIAGNOSIS — C719 Malignant neoplasm of brain, unspecified: Secondary | ICD-10-CM | POA: Diagnosis not present

## 2017-03-04 DIAGNOSIS — R740 Nonspecific elevation of levels of transaminase and lactic acid dehydrogenase [LDH]: Secondary | ICD-10-CM | POA: Diagnosis not present

## 2017-03-04 DIAGNOSIS — R7401 Elevation of levels of liver transaminase levels: Secondary | ICD-10-CM

## 2017-03-04 DIAGNOSIS — G250 Essential tremor: Secondary | ICD-10-CM

## 2017-03-04 NOTE — Patient Instructions (Signed)
1. Use weighted gloves. We will call you in a couple weeks to see how you are doing.

## 2017-03-05 ENCOUNTER — Ambulatory Visit (HOSPITAL_COMMUNITY): Payer: Medicare Other | Admitting: Physical Therapy

## 2017-03-05 DIAGNOSIS — R278 Other lack of coordination: Secondary | ICD-10-CM | POA: Diagnosis not present

## 2017-03-05 DIAGNOSIS — R2681 Unsteadiness on feet: Secondary | ICD-10-CM

## 2017-03-05 DIAGNOSIS — M6281 Muscle weakness (generalized): Secondary | ICD-10-CM

## 2017-03-05 NOTE — Therapy (Signed)
[place], [place], [address] Phone: [phone]   Fax:  [phone]  Physical Therapy Treatment  Patient Details  Name: Donna Valentine MRN: [id number] Date of Birth: 10/09/1941 Referring Provider: Ashok Pall   Encounter Date: 03/05/2017      PT End of Session - 03/05/17 1618    Visit Number 12   Number of Visits 21   Date for PT Re-Evaluation 03/26/17   Authorization Type [employer] Medicare (G-codes and KX); G-codes done 9th session    Authorization Time Period recert completed 8/78; 5/9-6/27   Authorization - Visit Number 12   Authorization - Number of Visits 19   PT Start Time 6767   PT Stop Time 1601   PT Time Calculation (min) 44 min   Equipment Utilized During Treatment Gait belt   Activity Tolerance Patient tolerated treatment well   Behavior During Therapy [facility] for tasks assessed/performed      Past Medical History:  Diagnosis Date  . GBM (glioblastoma multiforme) ([place])     Past Surgical History:  Procedure Laterality Date  . APPENDECTOMY  1994  . APPLICATION OF CRANIAL NAVIGATION N/A 11/28/2016   Procedure: APPLICATION OF CRANIAL NAVIGATION;  Surgeon: Ashok Pall, MD;  Location: [facility];  Service: Neurosurgery;  Laterality: N/A;  . CESAREAN SECTION     x3  . CRANIOTOMY Right 11/28/2016   Procedure: STERIOTACTIC PARIETAL CRANIOTOMY FOR RIGHT FRONTAL TUMOR RESECTION WITH BRAINLAB;  Surgeon: Ashok Pall, MD;  Location: [place];  Service: Neurosurgery;  Laterality: Right;    There were no vitals filed for this visit.      Subjective Assessment - 03/05/17 1522    Subjective Pt states she went back to the neurologist about her UE tremors and they recommended weighted gloves.  States she has had difficulty finding these.  Currently not havnig any pain.                         [facility] Adult PT Treatment/Exercise - 03/05/17 0001      Knee/Hip Exercises: Standing   Heel Raises Both;1  set;15 reps   Heel Raises Limitations heel and toe    Lateral Step Up Both;1 set;15 reps   Lateral Step Up Limitations 4 inch box    Forward Step Up Both;1 set;15 reps   Forward Step Up Limitations 4 inch box    Functional Squat 1 set;15 reps   Functional Squat Limitations facing chair for form              Balance Exercises - 03/05/17 1553      Balance Exercises: Standing   SLS Eyes open  max 11" Lt, 7" Rt no UE's   Tandem Gait 2 reps   Sidestepping 2 reps   Other Standing Exercises cone taps in semi-circle progressive combinations and difficulty (solid surface)             PT Short Term Goals - 02/25/17 1523      PT SHORT TERM GOAL #1   Title Patient to be able to identify 5/5 safety factors/concepts in order to demonstrate good safety and reduced fall risk    Time 2   Period Weeks   Status Partially Met     PT SHORT TERM GOAL #2   Title Patient to be able to ambulate over 739f in 3MWT in order to demonstrate improved general mobility and community access    Baseline 5/16- significantly reduced  Time 2   Period Weeks   Status On-going     PT SHORT TERM GOAL #3   Title Patient to be independent in correctly and consistently performing targeted HEP, to be updated as appropriate    Baseline 5/16- has not been adherent due to fatigue/malaise with cancer treatments    Time 1   Period Weeks   Status On-going     PT SHORT TERM GOAL #4   Title Patient to be performing regular progressive walking program, at least 10-15 minutes in duration and 1-2 times per day, in order to assist in addressing fatigue and functional weakness    Time 3   Period Weeks   Status New           PT Long Term Goals - 02/25/17 1525      PT LONG TERM GOAL #1   Title Patient to demonstrate functional strength 5/5 in all tested muscle groups in order to improve overall gait and balance    Baseline 5/16- significant decline    Time 4   Period Weeks   Status On-going     PT  LONG TERM GOAL #2   Title Patient to score 24 on DGI in order to show improved dynamic balance skills and overall reduced fall risk    Baseline 5/16- 17/24, signfiicant decline    Time 4   Period Weeks   Status On-going     PT LONG TERM GOAL #3   Title Patient to be participatory in regular aerobic exercise program, at least 20 minutes in duration and 4 days per week, in order to maintain functional gains and imrpove overall health status    Time 4   Period Weeks   Status On-going               Plan - 03/05/17 1620    Clinical Impression Statement Pt with less fatigue today as compared to last session. Discussed glove with OT who reports they are working on getting her one (laura E.).  Resumed balance beam activities this session with most difficulty completing tandem gait.  continues to have LE instability as determined with single leg stance without UE asssit.  Pt will require more exercises that address this.     Rehab Potential Good   Clinical Impairments Affecting Rehab Potential (+) high PLOF, very active at baseline, good recovery after craniotomy; (-) may be starting radiation/chemo soon/effects of these treatments on tolerance to rehab    PT Frequency 2x / week   PT Duration 6 weeks   PT Treatment/Interventions ADLs/Self Care Home Management;Biofeedback;Gait training;Stair training;Functional mobility training;Therapeutic activities;Therapeutic exercise;Balance training;Neuromuscular re-education;Patient/family education;Manual techniques;Energy conservation;Taping   PT Next Visit Plan continue progressing Nustep, continue progrsesing functional strength/balance. Continue to encourage regular progrsesive walking program using STAR book for tracking.    PT Home Exercise Plan Eval: sit to stand no UEs, seated clams with red and green TB, SLS, tandem stance 5/16: progressive walking program; 5/18: Given STAR booklet for education for fatgiue and documentation of walking program    Consulted and Agree with Plan of Care Patient      Patient will benefit from skilled therapeutic intervention in order to improve the following deficits and impairments:  Abnormal gait, Decreased coordination, Decreased mobility, Decreased activity tolerance, Decreased strength, Decreased balance, Difficulty walking  Visit Diagnosis: Other lack of coordination  Muscle weakness (generalized)  Unsteadiness on feet     Problem List Patient Active Problem List   Diagnosis Date Noted  .  Oral candida 01/08/2017  . Goals of care, counseling/discussion   . 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 Brain tumor ([place]) 11/28/2016  . 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 Left-sided weakness 11/20/2016  . 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 HYPERCHOLESTEROLEMIA, BORDERLINE 07/28/2008  . ANEMIA, MILD 07/28/2008  . DIVERTICULOSIS OF COLON 07/28/2008  . HEMATURIA, HX OF 07/28/2008  . COLONIC POLYPS 07/27/2008  . RIGHT BUNDLE BRANCH BLOCK 07/27/2008  . PREMATURE ATRIAL CONTRACTIONS 07/27/2008  . HEMORRHOIDS 07/27/2008  . VENOUS INSUFFICIENCY 07/27/2008  . BACK PAIN, LUMBAR 07/27/2008  . SCOLIOSIS 07/27/2008  . PALPITATIONS, HX OF 07/27/2008    Teena Irani, PTA/CLT [phone] 03/05/2017, 4:33 PM  [place] [address] [place], [place], [address] Phone: [phone]   Fax:  [phone]  Name: Donna Valentine MRN: [id number] Date of Birth: [birth year]

## 2017-03-09 ENCOUNTER — Encounter (HOSPITAL_COMMUNITY): Payer: Medicare Other

## 2017-03-10 ENCOUNTER — Ambulatory Visit: Admission: RE | Admit: 2017-03-10 | Payer: Medicare Other | Source: Ambulatory Visit | Admitting: Urology

## 2017-03-10 ENCOUNTER — Ambulatory Visit: Payer: Medicare Other | Admitting: Hematology

## 2017-03-10 ENCOUNTER — Other Ambulatory Visit: Payer: Medicare Other

## 2017-03-11 ENCOUNTER — Encounter (HOSPITAL_COMMUNITY): Payer: Medicare Other | Admitting: Specialist

## 2017-03-11 ENCOUNTER — Encounter (HOSPITAL_COMMUNITY): Payer: Medicare Other | Admitting: Physical Therapy

## 2017-03-11 ENCOUNTER — Telehealth (HOSPITAL_COMMUNITY): Payer: Self-pay | Admitting: Physical Therapy

## 2017-03-11 ENCOUNTER — Ambulatory Visit (HOSPITAL_COMMUNITY): Payer: Medicare Other | Admitting: Physical Therapy

## 2017-03-11 NOTE — Telephone Encounter (Signed)
Pt is sick from chemo and can not come today

## 2017-03-12 ENCOUNTER — Other Ambulatory Visit: Payer: Self-pay | Admitting: *Deleted

## 2017-03-12 ENCOUNTER — Encounter: Payer: Self-pay | Admitting: Hematology

## 2017-03-12 ENCOUNTER — Other Ambulatory Visit (HOSPITAL_BASED_OUTPATIENT_CLINIC_OR_DEPARTMENT_OTHER): Payer: Medicare Other

## 2017-03-12 ENCOUNTER — Telehealth: Payer: Self-pay | Admitting: Hematology

## 2017-03-12 ENCOUNTER — Ambulatory Visit (HOSPITAL_BASED_OUTPATIENT_CLINIC_OR_DEPARTMENT_OTHER): Payer: Medicare Other | Admitting: Hematology

## 2017-03-12 VITALS — BP 136/60 | HR 72 | Temp 98.6°F | Resp 18 | Ht 66.0 in | Wt 187.5 lb

## 2017-03-12 DIAGNOSIS — D696 Thrombocytopenia, unspecified: Secondary | ICD-10-CM | POA: Diagnosis not present

## 2017-03-12 DIAGNOSIS — R251 Tremor, unspecified: Secondary | ICD-10-CM | POA: Diagnosis not present

## 2017-03-12 DIAGNOSIS — C711 Malignant neoplasm of frontal lobe: Secondary | ICD-10-CM | POA: Diagnosis not present

## 2017-03-12 DIAGNOSIS — G969 Disorder of central nervous system, unspecified: Secondary | ICD-10-CM

## 2017-03-12 DIAGNOSIS — B372 Candidiasis of skin and nail: Secondary | ICD-10-CM

## 2017-03-12 DIAGNOSIS — C719 Malignant neoplasm of brain, unspecified: Secondary | ICD-10-CM

## 2017-03-12 LAB — CBC & DIFF AND RETIC
BASO%: 0.1 % (ref 0.0–2.0)
Basophils Absolute: 0 10e3/uL (ref 0.0–0.1)
EOS%: 0.3 % (ref 0.0–7.0)
Eosinophils Absolute: 0 10e3/uL (ref 0.0–0.5)
HCT: 42.8 % (ref 34.8–46.6)
HGB: 14.1 g/dL (ref 11.6–15.9)
Immature Retic Fract: 0.4 % — ABNORMAL LOW (ref 1.60–10.00)
LYMPH%: 16.2 % (ref 14.0–49.7)
MCH: 30 pg (ref 25.1–34.0)
MCHC: 32.9 g/dL (ref 31.5–36.0)
MCV: 91.1 fL (ref 79.5–101.0)
MONO#: 0.2 10e3/uL (ref 0.1–0.9)
MONO%: 2 % (ref 0.0–14.0)
NEUT#: 6 10e3/uL (ref 1.5–6.5)
NEUT%: 81.4 % — ABNORMAL HIGH (ref 38.4–76.8)
Platelets: 127 10e3/uL — ABNORMAL LOW (ref 145–400)
RBC: 4.7 10e6/uL (ref 3.70–5.45)
RDW: 13.9 % (ref 11.2–14.5)
Retic %: 1.2 % (ref 0.70–2.10)
Retic Ct Abs: 56.4 10e3/uL (ref 33.70–90.70)
WBC: 7.3 10e3/uL (ref 3.9–10.3)
lymph#: 1.2 10e3/uL (ref 0.9–3.3)

## 2017-03-12 LAB — COMPREHENSIVE METABOLIC PANEL
ALT: 53 U/L (ref 0–55)
ANION GAP: 10 meq/L (ref 3–11)
AST: 28 U/L (ref 5–34)
Albumin: 3.4 g/dL — ABNORMAL LOW (ref 3.5–5.0)
Alkaline Phosphatase: 79 U/L (ref 40–150)
BILIRUBIN TOTAL: 0.54 mg/dL (ref 0.20–1.20)
BUN: 14 mg/dL (ref 7.0–26.0)
CHLORIDE: 108 meq/L (ref 98–109)
CO2: 24 meq/L (ref 22–29)
Calcium: 9.6 mg/dL (ref 8.4–10.4)
Creatinine: 0.8 mg/dL (ref 0.6–1.1)
EGFR: 68 mL/min/{1.73_m2} — AB (ref >90)
Glucose: 176 mg/dl — ABNORMAL HIGH (ref 70–140)
Potassium: 4 mEq/L (ref 3.5–5.1)
Sodium: 142 mEq/L (ref 136–145)
TOTAL PROTEIN: 7.1 g/dL (ref 6.4–8.3)

## 2017-03-12 LAB — TSH: TSH: 0.363 m(IU)/L (ref 0.308–3.960)

## 2017-03-12 MED ORDER — TEMOZOLOMIDE 100 MG PO CAPS: 200.0000 mg/m2/d | ORAL_CAPSULE | Freq: Every day | ORAL | 0 refills | Status: DC

## 2017-03-12 MED ORDER — NYSTATIN 100000 UNIT/GM EX POWD: Freq: Three times a day (TID) | CUTANEOUS | 1 refills | Status: DC

## 2017-03-12 MED ORDER — FLUCONAZOLE 100 MG PO TABS: 100.0000 mg | ORAL_TABLET | Freq: Every day | ORAL | 0 refills | Status: DC

## 2017-03-12 NOTE — Telephone Encounter (Signed)
Appointments scheduled per 03/12/17 los. Patient was given a copy of the AVS report and appointment schedule per 03/12/17 los.

## 2017-03-12 NOTE — Patient Instructions (Signed)
Thank you for choosing [facility] to provide your oncology and hematology care.  To afford each patient quality time with our providers, please arrive 30 minutes before your scheduled appointment time.  If you arrive late for your appointment, you may be asked to reschedule.  We strive to give you quality time with our providers, and arriving late affects you and other patients whose appointments are after yours.  If you are a no show for multiple scheduled visits, you may be dismissed from the clinic at the providers discretion.   Again, thank you for choosing [facility], our hope is that these requests will decrease the amount of time that you wait before being seen by our physicians.  ______________________________________________________________________ Should you have questions after your visit to the [facility], please contact our office at [phone] between the hours of 8:30 and 4:30 p.m.    Voicemails left after 4:30p.m will not be returned until the following business day.   For prescription refill requests, please have your pharmacy contact us directly.  Please also try to allow 48 hours for prescription requests.   Please contact the scheduling department for questions regarding scheduling.  For scheduling of procedures such as PET scans, CT scans, MRI, Ultrasound, etc please contact central scheduling at [phone].   Resources For Cancer Patients and Caregivers:  American Cancer Society:  800-227-2345  Can help patients locate various types of support and financial assistance Cancer Care: 1-800-813-HOPE (4673) Provides financial assistance, online support groups, medication/co-pay assistance.   [place] DSS:  336-641-3447 Where to apply for food stamps, Medicaid, and utility assistance Medicare Rights Center: 800-333-4114 Helps people with Medicare understand their rights and benefits, navigate the Medicare system, and secure the  quality healthcare they deserve SCAT: 336-333-6589  [employer]'s shared-ride transportation service for eligible riders who have a disability that prevents them from riding the fixed route bus.   For additional information on assistance programs please contact our social worker:   Grier Hock/Abigail Elmore:  [phone] 

## 2017-03-13 ENCOUNTER — Other Ambulatory Visit: Payer: Medicare Other

## 2017-03-13 ENCOUNTER — Ambulatory Visit: Payer: Medicare Other | Admitting: Hematology

## 2017-03-13 ENCOUNTER — Ambulatory Visit (HOSPITAL_COMMUNITY): Payer: Medicare Other | Attending: Neurosurgery

## 2017-03-13 DIAGNOSIS — R2681 Unsteadiness on feet: Secondary | ICD-10-CM | POA: Diagnosis present

## 2017-03-13 DIAGNOSIS — M6281 Muscle weakness (generalized): Secondary | ICD-10-CM | POA: Diagnosis present

## 2017-03-13 DIAGNOSIS — R278 Other lack of coordination: Secondary | ICD-10-CM | POA: Diagnosis present

## 2017-03-13 LAB — T3 UPTAKE
Free Thyroxine Index: 2.2 (ref 1.2–4.9)
T3 Uptake Ratio: 27 % (ref 24–39)

## 2017-03-13 LAB — T4: T4 TOTAL: 8.3 ug/dL (ref 4.5–12.0)

## 2017-03-13 LAB — T4, FREE: FREE T4: 1.45 ng/dL (ref 0.82–1.77)

## 2017-03-13 NOTE — Addendum Note (Signed)
Addendum  created 03/13/17 1019 by Effie Berkshire, MD   Sign clinical note

## 2017-03-13 NOTE — Therapy (Signed)
[place], [place], [address] Phone: [phone]   Fax:  [phone]  Physical Therapy Treatment  Patient Details  Name: Donna Valentine MRN: [id number] Date of Birth: 02-Mar-1941 Referring Provider: Ashok Pall   Encounter Date: 03/13/2017      PT End of Session - 03/13/17 1438    Visit Number 13   Number of Visits 21   Date for PT Re-Evaluation 03/26/17   Authorization Type [employer] Medicare (G-codes and KX); G-codes done 9th session    Authorization Time Period recert completed 7/82; 5/9-6/27   Authorization - Visit Number 13   Authorization - Number of Visits 19   PT Start Time 4235   PT Stop Time 1523  8 min EOS on NUstep, no charge   PT Time Calculation (min) 49 min   Equipment Utilized During Treatment Gait belt   Activity Tolerance Patient tolerated treatment well;No increased pain   Behavior During Therapy WFL for tasks assessed/performed      Past Medical History:  Diagnosis Date  . GBM (glioblastoma multiforme) ([facility])     Past Surgical History:  Procedure Laterality Date  . APPENDECTOMY  1994  . APPLICATION OF CRANIAL NAVIGATION N/A 11/28/2016   Procedure: APPLICATION OF CRANIAL NAVIGATION;  Surgeon: Ashok Pall, MD;  Location: [facility];  Service: Neurosurgery;  Laterality: N/A;  . CESAREAN SECTION     x3  . CRANIOTOMY Right 11/28/2016   Procedure: STERIOTACTIC PARIETAL CRANIOTOMY FOR RIGHT FRONTAL TUMOR RESECTION WITH BRAINLAB;  Surgeon: Ashok Pall, MD;  Location: [facility];  Service: Neurosurgery;  Laterality: Right;    There were no vitals filed for this visit.      Subjective Assessment - 03/13/17 1433    Subjective Pt arrived wearing weighted gloves, pt. feels her UE tremors are getting worse.  Reports she has started chemo and feels that has slowed her down some.  no reports of pain today, just fatigued.     Patient Stated Goals improve strength, enhance independence    Currently in Pain?  No/denies                         [facility] Adult PT Treatment/Exercise - 03/13/17 0001      Knee/Hip Exercises: Aerobic   Nustep Nustep hills 2 level 3 x8 minutes   EOS not included in billing     Knee/Hip Exercises: Standing   Functional Squat 1 set;15 reps   Functional Squat Limitations facing chair for form    Stairs 3RT reciprocal pattern 7in step height, 1HR A     Knee/Hip Exercises: Seated   Sit to Sand 10 reps;without UE support             Balance Exercises - 03/13/17 1512      Balance Exercises: Standing   SLS Eyes open;5 reps  Lt 5" Rt 10" max of 5   Balance Beam tandem and sidestep 2RT on balance beam   Step Over Hurdles / Cones step over small/large hurdles with CGA forward and sidestep, x3 RT    Heel Raises Limitations 15x on incline slope   Toe Raise Limitations 15x on incline slope   Other Standing Exercises cone taps in semi-circle progressive combinations and difficulty (solid surface) BLE             PT Short Term Goals - 02/25/17 1523      PT SHORT TERM GOAL #1   Title Patient to be  able to identify 5/5 safety factors/concepts in order to demonstrate good safety and reduced fall risk    Time 2   Period Weeks   Status Partially Met     PT SHORT TERM GOAL #2   Title Patient to be able to ambulate over 781f in 3MWT in order to demonstrate improved general mobility and community access    Baseline 5/16- significantly reduced    Time 2   Period Weeks   Status On-going     PT SHORT TERM GOAL #3   Title Patient to be independent in correctly and consistently performing targeted HEP, to be updated as appropriate    Baseline 5/16- has not been adherent due to fatigue/malaise with cancer treatments    Time 1   Period Weeks   Status On-going     PT SHORT TERM GOAL #4   Title Patient to be performing regular progressive walking program, at least 10-15 minutes in duration and 1-2 times per day, in order to assist in addressing  fatigue and functional weakness    Time 3   Period Weeks   Status New           PT Long Term Goals - 02/25/17 1525      PT LONG TERM GOAL #1   Title Patient to demonstrate functional strength 5/5 in all tested muscle groups in order to improve overall gait and balance    Baseline 5/16- significant decline    Time 4   Period Weeks   Status On-going     PT LONG TERM GOAL #2   Title Patient to score 24 on DGI in order to show improved dynamic balance skills and overall reduced fall risk    Baseline 5/16- 17/24, signfiicant decline    Time 4   Period Weeks   Status On-going     PT LONG TERM GOAL #3   Title Patient to be participatory in regular aerobic exercise program, at least 20 minutes in duration and 4 days per week, in order to maintain functional gains and imrpove overall health status    Time 4   Period Weeks   Status On-going               Plan - 03/13/17 1444    Clinical Impression Statement Pt arrived with weighted gloves on head as suggested by MD to assist with tremors, noted tremors continue especially with Lt UE.  Discussed with OT who suggested to increase weight with glove for assistance.  Session focus on functional strengthening and balance beam activities, increased focus on SLS based activities to address LE instability, min A required for safety.  EOS on Nustep for activity tolerance and strengthening, not included in billing.  Pt will continue to benefit from functional strengthening and balance activities.   Rehab Potential Good   Clinical Impairments Affecting Rehab Potential (+) high PLOF, very active at baseline, good recovery after craniotomy; (-) may be starting radiation/chemo soon/effects of these treatments on tolerance to rehab    PT Frequency 2x / week   PT Duration 6 weeks   PT Treatment/Interventions ADLs/Self Care Home Management;Biofeedback;Gait training;Stair training;Functional mobility training;Therapeutic activities;Therapeutic  exercise;Balance training;Neuromuscular re-education;Patient/family education;Manual techniques;Energy conservation;Taping   PT Next Visit Plan continue progressing Nustep, continue progrsesing functional strength/balance. Continue to encourage regular progrsesive walking program using STAR book for tracking.   Add vector stance next session.   PT Home Exercise Plan Eval: sit to stand no UEs, seated clams with red and green TB,  SLS, tandem stance 5/16: progressive walking program; 5/18: Given STAR booklet for education for fatgiue and documentation of walking program      Patient will benefit from skilled therapeutic intervention in order to improve the following deficits and impairments:  Abnormal gait, Decreased coordination, Decreased mobility, Decreased activity tolerance, Decreased strength, Decreased balance, Difficulty walking  Visit Diagnosis: Other lack of coordination  Muscle weakness (generalized)  Unsteadiness on feet     Problem List Patient Active Problem List   Diagnosis Date Noted  . Oral candida 01/08/2017  . Goals of care, counseling/discussion   . Glioblastoma multiforme of frontal lobe ([facility]) 12/01/2016  . Brain tumor ([facility]) 11/28/2016  . 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 Left-sided weakness 11/20/2016  . 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 HYPERCHOLESTEROLEMIA, BORDERLINE 07/28/2008  . ANEMIA, MILD 07/28/2008  . DIVERTICULOSIS OF COLON 07/28/2008  . HEMATURIA, HX OF 07/28/2008  . COLONIC POLYPS 07/27/2008  . RIGHT BUNDLE BRANCH BLOCK 07/27/2008  . PREMATURE ATRIAL CONTRACTIONS 07/27/2008  . HEMORRHOIDS 07/27/2008  . VENOUS INSUFFICIENCY 07/27/2008  . BACK PAIN, LUMBAR 07/27/2008  . SCOLIOSIS 07/27/2008  . PALPITATIONS, HX OF 07/27/2008   Ihor Austin, [place]; [place]  Aldona Lento 03/13/2017, 3:22 PM  [facility] [place], [place], [address] Phone: [phone]   Fax:  [phone]  Name: Xylia Scherger MRN: [id number] Date of Birth: 1941-10-06

## 2017-03-16 ENCOUNTER — Telehealth: Payer: Self-pay | Admitting: Neurology

## 2017-03-16 ENCOUNTER — Other Ambulatory Visit: Payer: Self-pay | Admitting: *Deleted

## 2017-03-16 MED ORDER — PRIMIDONE 50 MG PO TABS: 25.0000 mg | ORAL_TABLET | Freq: Every day | ORAL | 3 refills | Status: DC

## 2017-03-16 NOTE — Telephone Encounter (Signed)
Rx sent to pharmacy   

## 2017-03-16 NOTE — Telephone Encounter (Signed)
Patient tried the gloves for the tremor and they did not work. So she would like the medication that Dr Tat was going to call in. She uses the [facility] in [address]

## 2017-03-16 NOTE — Telephone Encounter (Signed)
Primidone 25mg  at bedtime.

## 2017-03-16 NOTE — Telephone Encounter (Signed)
Dr. Doristine Devoid last note states that she would like to start a low dose of primidone if gloves do not work.  Not sure what dose.  Please advise.

## 2017-03-17 ENCOUNTER — Ambulatory Visit (HOSPITAL_COMMUNITY): Payer: Medicare Other

## 2017-03-17 DIAGNOSIS — R278 Other lack of coordination: Secondary | ICD-10-CM | POA: Diagnosis not present

## 2017-03-17 DIAGNOSIS — M6281 Muscle weakness (generalized): Secondary | ICD-10-CM

## 2017-03-17 DIAGNOSIS — R2681 Unsteadiness on feet: Secondary | ICD-10-CM

## 2017-03-17 NOTE — Therapy (Signed)
[facility] [place], [place], [address] Phone: [phone]   Fax:  [phone]  Physical Therapy Treatment  Patient Details  Name: Donna Valentine MRN: [id number] Date of Birth: [birth year] Referring Provider: Ashok Pall   Encounter Date: 03/17/2017      PT End of Session - 03/17/17 1513    Visit Number 14   Number of Visits 21   Date for PT Re-Evaluation 03/26/17   Authorization Type [employer] Medicare (G-codes and KX); G-codes done 9th session    Authorization - Visit Number 14   Authorization - Number of Visits 19   PT Start Time 7062   PT Stop Time 1521  EOS on Nustep, not included with billing   PT Time Calculation (min) 47 min   Equipment Utilized During Treatment Gait belt   Activity Tolerance Patient tolerated treatment well;No increased pain   Behavior During Therapy WFL for tasks assessed/performed      Past Medical History:  Diagnosis Date  . GBM (glioblastoma multiforme) ([facility])     Past Surgical History:  Procedure Laterality Date  . APPENDECTOMY  1994  . APPLICATION OF CRANIAL NAVIGATION N/A 11/28/2016   Procedure: APPLICATION OF CRANIAL NAVIGATION;  Surgeon: Ashok Pall, MD;  Location: [facility];  Service: Neurosurgery;  Laterality: N/A;  . CESAREAN SECTION     x3  . CRANIOTOMY Right 11/28/2016   Procedure: STERIOTACTIC PARIETAL CRANIOTOMY FOR RIGHT FRONTAL TUMOR RESECTION WITH BRAINLAB;  Surgeon: Ashok Pall, MD;  Location: [facility];  Service: Neurosurgery;  Laterality: Right;    There were no vitals filed for this visit.      Subjective Assessment - 03/17/17 1437    Subjective Pt arrived without gloves on hands, reports new medications that seems to assist with UE tremors.  Pt stated she feels a little more energy today, no reoprts of pain.   Patient Stated Goals improve strength, enhance independence    Currently in Pain? No/denies                         [facility] Adult PT  Treatment/Exercise - 03/17/17 0001      Knee/Hip Exercises: Aerobic   Nustep Nustep hills 2 level 3 x8 minutes   EOS, not included in billing     Knee/Hip Exercises: Standing   Heel Raises Both;1 set;15 reps   Heel Raises Limitations heel and toe    Functional Squat 2 sets;10 reps             Balance Exercises - 03/17/17 1503      Balance Exercises: Standing   Tandem Stance Foam/compliant surface;3 reps;30 secs   SLS Eyes open;5 reps   SLS with Vectors Solid surface;3 reps  BLE 3x 5"   Balance Beam tandem and sidestep 2RT on balance beam   Step Over Hurdles / Cones step over small/large hurdles with CGA forward and sidestep, x3 RT    Cone Rotation Foam/compliant surface;R/L   Heel Raises Limitations 15x on incline slope   Toe Raise Limitations 15x on incline slope   Other Standing Exercises cone taps in semi-circle progressive combinations and difficulty (solid surface) BLE             PT Short Term Goals - 02/25/17 1523      PT SHORT TERM GOAL #1   Title Patient to be able to identify 5/5 safety factors/concepts in order to demonstrate good safety and reduced fall risk  Time 2   Period Weeks   Status Partially Met     PT SHORT TERM GOAL #2   Title Patient to be able to ambulate over 760f in 3MWT in order to demonstrate improved general mobility and community access    Baseline 5/16- significantly reduced    Time 2   Period Weeks   Status On-going     PT SHORT TERM GOAL #3   Title Patient to be independent in correctly and consistently performing targeted HEP, to be updated as appropriate    Baseline 5/16- has not been adherent due to fatigue/malaise with cancer treatments    Time 1   Period Weeks   Status On-going     PT SHORT TERM GOAL #4   Title Patient to be performing regular progressive walking program, at least 10-15 minutes in duration and 1-2 times per day, in order to assist in addressing fatigue and functional weakness    Time 3   Period  Weeks   Status New           PT Long Term Goals - 02/25/17 1525      PT LONG TERM GOAL #1   Title Patient to demonstrate functional strength 5/5 in all tested muscle groups in order to improve overall gait and balance    Baseline 5/16- significant decline    Time 4   Period Weeks   Status On-going     PT LONG TERM GOAL #2   Title Patient to score 24 on DGI in order to show improved dynamic balance skills and overall reduced fall risk    Baseline 5/16- 17/24, signfiicant decline    Time 4   Period Weeks   Status On-going     PT LONG TERM GOAL #3   Title Patient to be participatory in regular aerobic exercise program, at least 20 minutes in duration and 4 days per week, in order to maintain functional gains and imrpove overall health status    Time 4   Period Weeks   Status On-going               Plan - 03/17/17 1515    Clinical Impression Statement Pt presents with improved activity tolerance this session, less seated rest breaks required and not as fatigued wiht activities.  Continued session focus on functional strengthening and balance activities with focus on improving SLS based activities.  Added vector stance to improve proximal strengthening and SLS, intermittent HHA was required for LOB episodes.  Pt is improving SLS with less assistance required with hurdles this session.  EOS on Nustep for strengthenig and activity tolerance, able to acheive 60 SPM.  No reports of pain through session.     Rehab Potential Good   Clinical Impairments Affecting Rehab Potential (+) high PLOF, very active at baseline, good recovery after craniotomy; (-) may be starting radiation/chemo soon/effects of these treatments on tolerance to rehab    PT Frequency 2x / week   PT Duration 6 weeks   PT Treatment/Interventions ADLs/Self Care Home Management;Biofeedback;Gait training;Stair training;Functional mobility training;Therapeutic activities;Therapeutic exercise;Balance  training;Neuromuscular re-education;Patient/family education;Manual techniques;Energy conservation;Taping   PT Next Visit Plan continue progressing Nustep, continue progrsesing functional strength/balance. Continue to encourage regular progrsesive walking program using STAR book for tracking.     PT Home Exercise Plan Eval: sit to stand no UEs, seated clams with red and green TB, SLS, tandem stance 5/16: progressive walking program; 5/18: Given STAR booklet for education for fatgiue and documentation of walking program  Patient will benefit from skilled therapeutic intervention in order to improve the following deficits and impairments:  Abnormal gait, Decreased coordination, Decreased mobility, Decreased activity tolerance, Decreased strength, Decreased balance, Difficulty walking  Visit Diagnosis: Other lack of coordination  Muscle weakness (generalized)  Unsteadiness on feet     Problem List Patient Active Problem List   Diagnosis Date Noted  . Oral candida 01/08/2017  . Goals of care, counseling/discussion   . Glioblastoma multiforme of frontal lobe ([facility]) 12/01/2016  . Brain tumor ([facility]) 11/28/2016  . 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 Left-sided weakness 11/20/2016  . 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 HYPERCHOLESTEROLEMIA, BORDERLINE 07/28/2008  . ANEMIA, MILD 07/28/2008  . DIVERTICULOSIS OF COLON 07/28/2008  . HEMATURIA, HX OF 07/28/2008  . COLONIC POLYPS 07/27/2008  . RIGHT BUNDLE BRANCH BLOCK 07/27/2008  . PREMATURE ATRIAL CONTRACTIONS 07/27/2008  . HEMORRHOIDS 07/27/2008  . VENOUS INSUFFICIENCY 07/27/2008  . BACK PAIN, LUMBAR 07/27/2008  . SCOLIOSIS 07/27/2008  . PALPITATIONS, HX OF 07/27/2008   Ihor Austin, LPTA; [place]  Aldona Lento 03/17/2017, 3:19 PM  [facility] [place], [place], [address] Phone: [phone]   Fax:   [phone]  Name: Donna Valentine MRN: [id number] Date of Birth: [birth year]

## 2017-03-18 ENCOUNTER — Ambulatory Visit (HOSPITAL_COMMUNITY): Payer: Medicare Other

## 2017-03-18 ENCOUNTER — Ambulatory Visit (HOSPITAL_COMMUNITY): Payer: Medicare Other | Admitting: Specialist

## 2017-03-18 ENCOUNTER — Encounter (HOSPITAL_COMMUNITY): Payer: Self-pay | Admitting: Specialist

## 2017-03-18 DIAGNOSIS — R278 Other lack of coordination: Secondary | ICD-10-CM

## 2017-03-18 DIAGNOSIS — R2681 Unsteadiness on feet: Secondary | ICD-10-CM

## 2017-03-18 DIAGNOSIS — M6281 Muscle weakness (generalized): Secondary | ICD-10-CM

## 2017-03-18 NOTE — Therapy (Signed)
[facility] [place], [place], [address] Phone: [phone]   Fax:  [phone]  Physical Therapy Treatment  Patient Details  Name: Donna Valentine MRN: [id number] Date of Birth: November 29, 1940 Referring Provider: Ashok Pall   Encounter Date: 03/18/2017      PT End of Session - 03/18/17 1439    Visit Number 15   Number of Visits 21   PT Start Time 5176   PT Stop Time 1523   PT Time Calculation (min) 49 min   Equipment Utilized During Treatment Gait belt   Activity Tolerance Patient tolerated treatment well;Patient limited by fatigue;No increased pain   Behavior During Therapy WFL for tasks assessed/performed      Past Medical History:  Diagnosis Date  . GBM (glioblastoma multiforme) ([facility])     Past Surgical History:  Procedure Laterality Date  . APPENDECTOMY  1994  . APPLICATION OF CRANIAL NAVIGATION N/A 11/28/2016   Procedure: APPLICATION OF CRANIAL NAVIGATION;  Surgeon: Ashok Pall, MD;  Location: [facility];  Service: Neurosurgery;  Laterality: N/A;  . CESAREAN SECTION     x3  . CRANIOTOMY Right 11/28/2016   Procedure: STERIOTACTIC PARIETAL CRANIOTOMY FOR RIGHT FRONTAL TUMOR RESECTION WITH BRAINLAB;  Surgeon: Ashok Pall, MD;  Location: [facility];  Service: Neurosurgery;  Laterality: Right;    There were no vitals filed for this visit.      Subjective Assessment - 03/18/17 1434    Subjective Pt stated she is feeling good today, no reports of pain.  Reports tremors continue but have reduced since began new medication.     Patient Stated Goals improve strength, enhance independence    Currently in Pain? No/denies              [facility] Adult PT Treatment/Exercise - 03/18/17 0001      Knee/Hip Exercises: Aerobic   Nustep Nustep hills 2 level 3 x8 minutes   EOS not included in billing     Knee/Hip Exercises: Standing   Heel Raises Both;15 reps;2 sets   Heel Raises Limitations proper lifting wiht orange bell  then heel raise overhead   Functional Squat 2 sets;10 reps   Stairs 3RT reciprocal pattern 7in step height, 1HR A             Balance Exercises - 03/18/17 1506      Balance Exercises: Standing   SLS Eyes open;5 reps  Lt 10", Rt 8"   SLS with Vectors Solid surface;3 reps  BLE 3x 5" with intermittent 1 HHA   Balance Beam tandem and sidestep 2RT on balance beam   Sidestepping 2 reps;Theraband  GTB   Step Over Hurdles / Cones step over small/large hurdles with CGA forward and sidestep, x3 RT    Heel Raises Limitations 15x on incline slope   Toe Raise Limitations 15x on incline slope   Other Standing Exercises cone taps in semi-circle progressive combinations and difficulty (solid surface) BLE             PT Short Term Goals - 02/25/17 1523      PT SHORT TERM GOAL #1   Title Patient to be able to identify 5/5 safety factors/concepts in order to demonstrate good safety and reduced fall risk    Time 2   Period Weeks   Status Partially Met     PT SHORT TERM GOAL #2   Title Patient to be able to ambulate over 741f in 3MWT in order to demonstrate improved general mobility  and community access    Baseline 5/16- significantly reduced    Time 2   Period Weeks   Status On-going     PT SHORT TERM GOAL #3   Title Patient to be independent in correctly and consistently performing targeted HEP, to be updated as appropriate    Baseline 5/16- has not been adherent due to fatigue/malaise with cancer treatments    Time 1   Period Weeks   Status On-going     PT SHORT TERM GOAL #4   Title Patient to be performing regular progressive walking program, at least 10-15 minutes in duration and 1-2 times per day, in order to assist in addressing fatigue and functional weakness    Time 3   Period Weeks   Status New           PT Long Term Goals - 02/25/17 1525      PT LONG TERM GOAL #1   Title Patient to demonstrate functional strength 5/5 in all tested muscle groups in order to  improve overall gait and balance    Baseline 5/16- significant decline    Time 4   Period Weeks   Status On-going     PT LONG TERM GOAL #2   Title Patient to score 24 on DGI in order to show improved dynamic balance skills and overall reduced fall risk    Baseline 5/16- 17/24, signfiicant decline    Time 4   Period Weeks   Status On-going     PT LONG TERM GOAL #3   Title Patient to be participatory in regular aerobic exercise program, at least 20 minutes in duration and 4 days per week, in order to maintain functional gains and imrpove overall health status    Time 4   Period Weeks   Status On-going               Plan - 03/18/17 1459    Clinical Impression Statement Continued session focus with functinal strengthening and balance training.  Pt reoprts increased LE fatigue with tasks requiring increased seated rest breaks this session.  Continued balance focus with SLS based activities wiht min A for safety, added resistance with sidestepping for glut med strengthening.  Visible fatigue, no reoprts of pain through session.  Pt does continues to have tremors Bil UE through session.     Rehab Potential Good   Clinical Impairments Affecting Rehab Potential (+) high PLOF, very active at baseline, good recovery after craniotomy; (-) may be starting radiation/chemo soon/effects of these treatments on tolerance to rehab    PT Frequency 2x / week   PT Duration 6 weeks   PT Treatment/Interventions ADLs/Self Care Home Management;Biofeedback;Gait training;Stair training;Functional mobility training;Therapeutic activities;Therapeutic exercise;Balance training;Neuromuscular re-education;Patient/family education;Manual techniques;Energy conservation;Taping   PT Next Visit Plan continue progressing Nustep, continue progrsesing functional strength/balance. Continue to encourage regular progrsesive walking program using STAR book for tracking.        Patient will benefit from skilled therapeutic  intervention in order to improve the following deficits and impairments:  Abnormal gait, Decreased coordination, Decreased mobility, Decreased activity tolerance, Decreased strength, Decreased balance, Difficulty walking  Visit Diagnosis: Other lack of coordination  Muscle weakness (generalized)  Unsteadiness on feet     Problem List Patient Active Problem List   Diagnosis Date Noted  . Oral candida 01/08/2017  . Goals of care, counseling/discussion   . Glioblastoma multiforme of frontal lobe ([facility]) 12/01/2016  . Brain tumor ([facility]) 11/28/2016  . Multifocal glioblastoma of the right  frontal lobe of brain ([facility]) 11/20/2016  . Left-sided weakness 11/20/2016  . 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 HYPERCHOLESTEROLEMIA, BORDERLINE 07/28/2008  . ANEMIA, MILD 07/28/2008  . DIVERTICULOSIS OF COLON 07/28/2008  . HEMATURIA, HX OF 07/28/2008  . COLONIC POLYPS 07/27/2008  . RIGHT BUNDLE BRANCH BLOCK 07/27/2008  . PREMATURE ATRIAL CONTRACTIONS 07/27/2008  . HEMORRHOIDS 07/27/2008  . VENOUS INSUFFICIENCY 07/27/2008  . BACK PAIN, LUMBAR 07/27/2008  . SCOLIOSIS 07/27/2008  . PALPITATIONS, HX OF 07/27/2008   Ihor Austin, [place]; [place]  Aldona Lento 03/18/2017, 3:24 PM  [facility] [address] [place], [place], [address] Phone: [phone]   Fax:  [phone]  Name: Donna Valentine MRN: [id number] Date of Birth: 1941-05-28

## 2017-03-18 NOTE — Therapy (Signed)
[place], [place], [address] Phone: [phone]   Fax:  [phone]  Occupational Therapy Treatment  Patient Details  Name: Donna Valentine MRN: [id number] Date of Birth: Jan 09, 1941 Referring Provider: Dr. Ashok Pall  Encounter Date: 03/18/2017      OT End of Session - 03/18/17 1613    Visit Number 9   Number of Visits 12   Date for OT Re-Evaluation 03/29/17   Authorization Type [employer] Medicare   Authorization Time Period before 18th visit   Authorization - Visit Number 9   Authorization - Number of Visits 18   OT Start Time 4158  arrived at 1412 for 1345 appointment   OT Stop Time 1430   OT Time Calculation (min) 15 min   Activity Tolerance Patient tolerated treatment well   Behavior During Therapy [facility] for tasks assessed/performed      Past Medical History:  Diagnosis Date  . GBM (glioblastoma multiforme) ([place])     Past Surgical History:  Procedure Laterality Date  . APPENDECTOMY  1994  . APPLICATION OF CRANIAL NAVIGATION N/A 11/28/2016   Procedure: APPLICATION OF CRANIAL NAVIGATION;  Surgeon: Ashok Pall, MD;  Location: [place];  Service: Neurosurgery;  Laterality: N/A;  . CESAREAN SECTION     x3  . CRANIOTOMY Right 11/28/2016   Procedure: STERIOTACTIC PARIETAL CRANIOTOMY FOR RIGHT FRONTAL TUMOR RESECTION WITH BRAINLAB;  Surgeon: Ashok Pall, MD;  Location: [place];  Service: Neurosurgery;  Laterality: Right;    There were no vitals filed for this visit.      Subjective Assessment - 03/18/17 1613    Subjective  S:  My hand is getting much stronger, its not back to normal but better   Currently in Pain? No/denies   Pain Score 0-No pain            [facility] OT Assessment - 03/18/17 0001      Assessment   Diagnosis LUE Weakness S/P removal of right frontal lobe glioblastoma multiforme     Precautions   Precautions Other (comment)   Precaution Comments brain tumor, hx craniotomy, on active  radiation                   OT Treatments/Exercises (OP) - 03/18/17 0001      Exercises   Exercises Hand     Hand Exercises   Theraputty Flatten;Roll;Grip   Theraputty - Flatten red    Theraputty - Roll red    Theraputty - Grip red supinated and pronated grasp   Hand Gripper with Large Beads yellow and black gripper - able to pick up 2 beads then hand fatigued   Sponges 11, 12, 9             Balance Exercises - 03/18/17 1506      Balance Exercises: Standing   SLS Eyes open;5 reps  Lt 10", Rt 8"   SLS with Vectors Solid surface;3 reps  BLE 3x 5" with intermittent 1 HHA   Balance Beam tandem and sidestep 2RT on balance beam   Sidestepping 2 reps;Theraband  GTB   Step Over Hurdles / Cones step over small/large hurdles with CGA forward and sidestep, x3 RT    Heel Raises Limitations 15x on incline slope   Toe Raise Limitations 15x on incline slope   Other Standing Exercises cone taps in semi-circle progressive combinations and difficulty (solid surface) BLE             OT Short Term  Goals - 02/27/17 1538      OT SHORT TERM GOAL #1   Title Patient will be educated and independent with an HEP for improved functional use of LUE with all daily tasks.    Time 6   Period Weeks   Status On-going     OT SHORT TERM GOAL #2   Title Patient will return to prior level of function, using her left upper extremity actively with all tasks.    Time 6   Period Weeks   Status On-going     OT SHORT TERM GOAL #3   Title Patient will improve left shoulder A/ROM to WNL for improved ability to reach into overhead cabinets.    Time 6   Period Weeks   Status On-going     OT SHORT TERM GOAL #4   Title Patient will improve left upper extremity strength to 4+/5 for improved ability to lift shopping bags.    Time 6   Period Weeks   Status Partially Met     OT SHORT TERM GOAL #5   Title Patient will improve left grip strength by 10 pounds and pinch strength by 6 pounds  or more for improved ability to open containers and maintain grip on objects.     Time 6   Period Weeks   Status On-going     OT SHORT TERM GOAL #6   Title Patient will improve fine motor coordination needed to fasten jewelery by decreasing completion time on nine hole peg test by 10 seconds.    Time 6   Period Weeks   Status On-going     OT SHORT TERM GOAL #7   Title Patient will decrease non purposeful tremors from moderate to minimal for improved abilty to complete daily tasks safely.    Baseline 5/18: Goal deferred at this time due to unknown source of tremors.   Time 6   Period Weeks   Status Deferred                  Plan - 03/18/17 1614    Clinical Impression Statement A:  treatment focused on grip strengthening.  patient had max difficulty closing yellow and black triangular gripper this date.     Plan P:  improve BUE strength and left grip strength required for resumption of participation in daily tasks.        Patient will benefit from skilled therapeutic intervention in order to improve the following deficits and impairments:     Visit Diagnosis: Other lack of coordination    Problem List Patient Active Problem List   Diagnosis Date Noted  . Oral candida 01/08/2017  . Goals of care, counseling/discussion   . Glioblastoma multiforme of frontal lobe ([place]) 12/01/2016  . Brain tumor ([place]) 11/28/2016  . 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 Left-sided weakness 11/20/2016  . 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 HYPERCHOLESTEROLEMIA, BORDERLINE 07/28/2008  . ANEMIA, MILD 07/28/2008  . DIVERTICULOSIS OF COLON 07/28/2008  . HEMATURIA, HX OF 07/28/2008  . COLONIC POLYPS 07/27/2008  . RIGHT BUNDLE BRANCH BLOCK 07/27/2008  . PREMATURE ATRIAL CONTRACTIONS 07/27/2008  . HEMORRHOIDS 07/27/2008  . VENOUS INSUFFICIENCY 07/27/2008  . BACK PAIN, LUMBAR 07/27/2008  . SCOLIOSIS 07/27/2008  . PALPITATIONS, HX OF  07/27/2008    Vangie Bicker, [place], OTR/L [phone]  03/18/2017, 4:20 PM  [facility] [address] [place], [place], [address] Phone: [phone]   Fax:  [phone]  Name: Donna Valentine MRN: [id number] Date of Birth: 27-Sep-1941

## 2017-03-19 ENCOUNTER — Ambulatory Visit (HOSPITAL_COMMUNITY): Payer: Medicare Other

## 2017-03-24 ENCOUNTER — Ambulatory Visit (HOSPITAL_COMMUNITY): Payer: Medicare Other | Admitting: Physical Therapy

## 2017-03-24 DIAGNOSIS — M6281 Muscle weakness (generalized): Secondary | ICD-10-CM

## 2017-03-24 DIAGNOSIS — R2681 Unsteadiness on feet: Secondary | ICD-10-CM

## 2017-03-24 DIAGNOSIS — R278 Other lack of coordination: Secondary | ICD-10-CM

## 2017-03-24 NOTE — Therapy (Signed)
[place], [place], [address] Phone: [phone]   Fax:  [phone]  Physical Therapy Treatment (Re-Assessment)  Patient Details  Name: Donna Valentine MRN: [id number] Date of Birth: April 30, 1941 Referring Provider: Ashok Pall   Encounter Date: 03/24/2017      PT End of Session - 03/24/17 1205    Visit Number 16   Number of Visits 24   Date for PT Re-Evaluation 04/21/17   Authorization Type UHC Medicare (G-codes and KX); G-codes done 2023-01-04 session    Authorization Time Period recert completed 9/76; 7/3-4/19; recert done 3/79   Authorization - Visit Number 15   Authorization - Number of Visits 25   PT Start Time 1118   PT Stop Time 1156   PT Time Calculation (min) 38 min   Activity Tolerance Patient tolerated treatment well   Behavior During Therapy [facility] for tasks assessed/performed      Past Medical History:  Diagnosis Date  . GBM (glioblastoma multiforme) ([place])     Past Surgical History:  Procedure Laterality Date  . APPENDECTOMY  1994  . APPLICATION OF CRANIAL NAVIGATION N/A 11/28/2016   Procedure: APPLICATION OF CRANIAL NAVIGATION;  Surgeon: Ashok Pall, MD;  Location: [facility];  Service: Neurosurgery;  Laterality: N/A;  . CESAREAN SECTION     x3  . CRANIOTOMY Right 11/28/2016   Procedure: STERIOTACTIC PARIETAL CRANIOTOMY FOR RIGHT FRONTAL TUMOR RESECTION WITH BRAINLAB;  Surgeon: Ashok Pall, MD;  Location: [place];  Service: Neurosurgery;  Laterality: Right;    There were no vitals filed for this visit.      Subjective Assessment - 03/24/17 1119    Subjective Patient states that the medicine that she has for tremors seems to be helping a little. She states things in general are going better, her legs seemed a little weak last week but she is feeling better this week. Fatigue has not been as bad the past couple of days but she has a chemo dose starting sunday.    Pertinent History glioblastoma of R  frontal lobe, craniotomy done on 11/28/16   Patient Stated Goals improve strength, enhance independence    Currently in Pain? No/denies            [facility] PT Assessment - 03/24/17 0001      Assessment   Medical Diagnosis glioblastoma    Referring Provider Ashok Pall    Onset Date/Surgical Date 11/20/16   Next MD Visit Dr. Christella Noa tomorrow    Prior Therapy she did get [facility]      Precautions   Precautions Other (comment)   Precaution Comments brain tumor, hx craniotomy, on active radiation      Balance Screen   Has the patient fallen in the past 6 months No   Has the patient had a decrease in activity level because of a fear of falling?  No   Is the patient reluctant to leave their home because of a fear of falling?  No     Prior Function   Level of Independence Independent   Vocation Retired   Leisure shopping, grandkids, church activities, eating out     Strength   Right Hip Flexion 3+/5   Right Hip Extension 2+/5   Right Hip ABduction 4-/5   Left Hip Flexion 3/5   Left Hip Extension 2+/5   Left Hip ABduction 3/5   Right Knee Flexion 4+/5   Right Knee Extension 5/5   Left Knee Flexion  4+/5   Left Knee Extension 5/5   Right Ankle Dorsiflexion 5/5   Left Ankle Dorsiflexion 5/5     6 minute walk test results    Aerobic Endurance Distance Walked 626   Endurance additional comments 3MWT      Dynamic Gait Index   Level Surface Normal   Change in Gait Speed Normal   Gait with Horizontal Head Turns Normal   Gait with Vertical Head Turns Normal   Gait and Pivot Turn Normal   Step Over Obstacle Normal   Step Around Obstacles Normal   Steps Mild Impairment   Total Score 23                             PT Education - 03/24/17 1204    Education provided Yes   Education Details discussed her extensive course of cancer/cancer treatments in relation to physical function and role of PT/graded activity/independent exercise in  progressing back to PLOF over time. POC moving forward    Person(s) Educated Patient   Methods Explanation   Comprehension Verbalized understanding          PT Short Term Goals - 03/24/17 1140      PT SHORT TERM GOAL #1   Title Patient to be able to identify 5/5 safety factors/concepts in order to demonstrate good safety and reduced fall risk    Baseline 6/12- 2/5   Time 2   Period Weeks   Status Partially Met     PT SHORT TERM GOAL #2   Title Patient to be able to ambulate over 741f in 3MWT in order to demonstrate improved general mobility and community access    Baseline 6/12- 626   Time 2   Period Weeks   Status On-going     PT SHORT TERM GOAL #3   Title Patient to be independent in correctly and consistently performing targeted HEP, to be updated as appropriate    Baseline 6/12- walks, somewhat compliant with other exercises   Period Weeks   Status Partially Met     PT SHORT TERM GOAL #4   Title Patient to be performing regular progressive walking program, at least 10-15 minutes in duration and 1-2 times per day, in order to assist in addressing fatigue and functional weakness    Baseline 6/12- has not walked this much per day, but does walk daily    Time 3   Period Weeks   Status On-going           PT Long Term Goals - 03/24/17 1143      PT LONG TERM GOAL #1   Title Patient to demonstrate functional strength 5/5 in all tested muscle groups in order to improve overall gait and balance    Baseline 6/12- proximal weakness    Time 4   Period Weeks   Status Partially Met     PT LONG TERM GOAL #2   Title Patient to score 24 on DGI in order to show improved dynamic balance skills and overall reduced fall risk    Baseline 6/12- 23/24    Time 4   Period Weeks   Status Partially Met     PT LONG TERM GOAL #3   Title Patient to be participatory in regular aerobic exercise program, at least 20 minutes in duration and 4 days per week, in order to maintain  functional gains and imrpove overall health status    Baseline 6/12- discussed  today    Time 4   Period Weeks   Status On-going               Plan - April 11, 2017 1205    Clinical Impression Statement Re-assessment performed today. Patient appears to be making objective improvement in all measured areas, however does continue to demonstrate significant deficit in functional strength- especially proximal musculature- and functional activity tolerance. She does state that her main concern is returning to her PLOF; discussed her extensive course of cancer/cancer treatments in relation to physical function and role of PT/graded activity/independent exercise in progressing back to PLOF over time. Recommend extension of skilled PT services to continue addressing functional deficits and facilitate independence in graded regular activity/exercise program.    Rehab Potential Good   Clinical Impairments Affecting Rehab Potential (+) high PLOF, very active at baseline, good recovery after craniotomy; (-) may be starting radiation/chemo soon/effects of these treatments on tolerance to rehab    PT Frequency 2x / week   PT Duration 4 weeks   PT Treatment/Interventions ADLs/Self Care Home Management;Biofeedback;Gait training;Stair training;Functional mobility training;Therapeutic activities;Therapeutic exercise;Balance training;Neuromuscular re-education;Patient/family education;Manual techniques;Energy conservation;Taping   PT Next Visit Plan Continue to progress Nustep, intorduce TM. Work on proximal strength as this is quite weak. Advanced balance. continue to encourage independent exercise program, ask daughter to come back at some point to discuss this    PT Home Exercise Plan Eval: sit to stand no UEs, seated clams with red and green TB, SLS, tandem stance 5/16: progressive walking program; 5/18: Given STAR booklet for education for fatgiue and documentation of walking program   Consulted and Agree with  Plan of Care Patient      Patient will benefit from skilled therapeutic intervention in order to improve the following deficits and impairments:  Abnormal gait, Decreased coordination, Decreased mobility, Decreased activity tolerance, Decreased strength, Decreased balance, Difficulty walking  Visit Diagnosis: Other lack of coordination - Plan: PT plan of care cert/re-cert  Muscle weakness (generalized) - Plan: PT plan of care cert/re-cert  Unsteadiness on feet - Plan: PT plan of care cert/re-cert       G-Codes - 04-11-17 01/05/1206    Functional Assessment Tool Used (Outpatient Only) Based on skilled clincial assessment of strenght, gait, dynamic balance, overall fall risk, functional activity tolerance    Functional Limitation Mobility: Walking and moving around   Mobility: Walking and Moving Around Current Status (R4854) At least 20 percent but less than 40 percent impaired, limited or restricted   Mobility: Walking and Moving Around Goal Status [phone]) At least 1 percent but less than 20 percent impaired, limited or restricted      Problem List Patient Active Problem List   Diagnosis Date Noted  . Oral candida 01/08/2017  . Goals of care, counseling/discussion   . Glioblastoma multiforme of frontal lobe ([place]) 12/01/2016  . Brain tumor ([place]) 11/28/2016  . 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 Left-sided weakness 11/20/2016  . 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 HYPERCHOLESTEROLEMIA, BORDERLINE 07/28/2008  . ANEMIA, MILD 07/28/2008  . DIVERTICULOSIS OF COLON 07/28/2008  . HEMATURIA, HX OF 07/28/2008  . COLONIC POLYPS 07/27/2008  . RIGHT BUNDLE BRANCH BLOCK 07/27/2008  . PREMATURE ATRIAL CONTRACTIONS 07/27/2008  . HEMORRHOIDS 07/27/2008  . VENOUS INSUFFICIENCY 07/27/2008  . BACK PAIN, LUMBAR 07/27/2008  . SCOLIOSIS 07/27/2008  . PALPITATIONS, HX OF 07/27/2008    Deniece Ree PT, DPT [address]  [place], [place], [address] Phone: [phone]   Fax:  [phone]  Name: Donna Valentine MRN: [id number] Date of Birth: 1941/07/01

## 2017-03-25 ENCOUNTER — Ambulatory Visit (HOSPITAL_BASED_OUTPATIENT_CLINIC_OR_DEPARTMENT_OTHER): Payer: Medicare Other | Admitting: Hematology

## 2017-03-25 ENCOUNTER — Ambulatory Visit (HOSPITAL_COMMUNITY): Payer: Medicare Other | Admitting: Occupational Therapy

## 2017-03-25 ENCOUNTER — Telehealth: Payer: Self-pay | Admitting: Hematology

## 2017-03-25 ENCOUNTER — Telehealth: Payer: Self-pay

## 2017-03-25 ENCOUNTER — Telehealth: Payer: Self-pay | Admitting: Neurology

## 2017-03-25 ENCOUNTER — Other Ambulatory Visit (HOSPITAL_BASED_OUTPATIENT_CLINIC_OR_DEPARTMENT_OTHER): Payer: Medicare Other

## 2017-03-25 ENCOUNTER — Encounter: Payer: Self-pay | Admitting: Hematology

## 2017-03-25 VITALS — BP 127/74 | HR 73 | Temp 98.6°F | Resp 20 | Ht 66.0 in | Wt 186.9 lb

## 2017-03-25 DIAGNOSIS — R251 Tremor, unspecified: Secondary | ICD-10-CM | POA: Diagnosis not present

## 2017-03-25 DIAGNOSIS — G969 Disorder of central nervous system, unspecified: Secondary | ICD-10-CM

## 2017-03-25 DIAGNOSIS — C711 Malignant neoplasm of frontal lobe: Secondary | ICD-10-CM | POA: Diagnosis not present

## 2017-03-25 DIAGNOSIS — C719 Malignant neoplasm of brain, unspecified: Secondary | ICD-10-CM

## 2017-03-25 DIAGNOSIS — I10 Essential (primary) hypertension: Secondary | ICD-10-CM

## 2017-03-25 LAB — CBC & DIFF AND RETIC
BASO%: 0.4 % (ref 0.0–2.0)
Basophils Absolute: 0 10*3/uL (ref 0.0–0.1)
EOS ABS: 0.1 10*3/uL (ref 0.0–0.5)
EOS%: 2.2 % (ref 0.0–7.0)
HCT: 43.8 % (ref 34.8–46.6)
HEMOGLOBIN: 14.4 g/dL (ref 11.6–15.9)
IMMATURE RETIC FRACT: 4 % (ref 1.60–10.00)
LYMPH#: 1.2 10*3/uL (ref 0.9–3.3)
LYMPH%: 24.8 % (ref 14.0–49.7)
MCH: 30.2 pg (ref 25.1–34.0)
MCHC: 32.9 g/dL (ref 31.5–36.0)
MCV: 91.8 fL (ref 79.5–101.0)
MONO#: 0.5 10*3/uL (ref 0.1–0.9)
MONO%: 10.9 % (ref 0.0–14.0)
NEUT%: 61.7 % (ref 38.4–76.8)
NEUTROS ABS: 3.1 10*3/uL (ref 1.5–6.5)
Platelets: 113 10*3/uL — ABNORMAL LOW (ref 145–400)
RBC: 4.77 10*6/uL (ref 3.70–5.45)
RDW: 13.5 % (ref 11.2–14.5)
RETIC %: 1.13 % (ref 0.70–2.10)
RETIC CT ABS: 53.9 10*3/uL (ref 33.70–90.70)
WBC: 5 10*3/uL (ref 3.9–10.3)

## 2017-03-25 LAB — COMPREHENSIVE METABOLIC PANEL
ALBUMIN: 3.2 g/dL — AB (ref 3.5–5.0)
ALT: 24 U/L (ref 0–55)
AST: 21 U/L (ref 5–34)
Alkaline Phosphatase: 75 U/L (ref 40–150)
Anion Gap: 9 mEq/L (ref 3–11)
BUN: 12.9 mg/dL (ref 7.0–26.0)
CO2: 28 mEq/L (ref 22–29)
Calcium: 9.4 mg/dL (ref 8.4–10.4)
Chloride: 106 mEq/L (ref 98–109)
Creatinine: 0.9 mg/dL (ref 0.6–1.1)
EGFR: 65 mL/min/{1.73_m2} — ABNORMAL LOW (ref >90)
GLUCOSE: 84 mg/dL (ref 70–140)
POTASSIUM: 3.8 meq/L (ref 3.5–5.1)
SODIUM: 143 meq/L (ref 136–145)
Total Bilirubin: 0.56 mg/dL (ref 0.20–1.20)
Total Protein: 6.9 g/dL (ref 6.4–8.3)

## 2017-03-25 NOTE — Patient Instructions (Signed)
Thank you for choosing [facility] to provide your oncology and hematology care.  To afford each patient quality time with our providers, please arrive 30 minutes before your scheduled appointment time.  If you arrive late for your appointment, you may be asked to reschedule.  We strive to give you quality time with our providers, and arriving late affects you and other patients whose appointments are after yours.  If you are a no show for multiple scheduled visits, you may be dismissed from the clinic at the providers discretion.   Again, thank you for choosing [facility], our hope is that these requests will decrease the amount of time that you wait before being seen by our physicians.  ______________________________________________________________________ Should you have questions after your visit to the [facility], please contact our office at [phone] between the hours of 8:30 and 4:30 p.m.    Voicemails left after 4:30p.m will not be returned until the following business day.   For prescription refill requests, please have your pharmacy contact us directly.  Please also try to allow 48 hours for prescription requests.   Please contact the scheduling department for questions regarding scheduling.  For scheduling of procedures such as PET scans, CT scans, MRI, Ultrasound, etc please contact central scheduling at [phone].   Resources For Cancer Patients and Caregivers:  American Cancer Society:  800-227-2345  Can help patients locate various types of support and financial assistance Cancer Care: 1-800-813-HOPE (4673) Provides financial assistance, online support groups, medication/co-pay assistance.   [place] DSS:  336-641-3447 Where to apply for food stamps, Medicaid, and utility assistance Medicare Rights Center: 800-333-4114 Helps people with Medicare understand their rights and benefits, navigate the Medicare system, and secure the  quality healthcare they deserve SCAT: 336-333-6589 [place] [employer]'s shared-ride transportation service for eligible riders who have a disability that prevents them from riding the fixed route bus.   For additional information on assistance programs please contact our social worker:   Grier Hock/Abigail Elmore:  [phone] 

## 2017-03-25 NOTE — Telephone Encounter (Signed)
Left message on machine for patient to call back.  To see how she is doing with weighted gloves. Awaiting call back to discuss.

## 2017-03-25 NOTE — Telephone Encounter (Signed)
-----   Message from Annamaria Helling, [place] sent at 03/04/2017 11:24 AM EDT ----- Call to see how doing with weighted gloves Maybe start Primidone

## 2017-03-25 NOTE — Telephone Encounter (Signed)
Scheduled appt per 6/13 los - Gave patient AVS and calender per LOS . Central radiology to contact patient with CT

## 2017-03-26 ENCOUNTER — Ambulatory Visit (HOSPITAL_COMMUNITY): Payer: Medicare Other | Admitting: Occupational Therapy

## 2017-03-26 ENCOUNTER — Ambulatory Visit (HOSPITAL_COMMUNITY): Payer: Medicare Other | Admitting: Physical Therapy

## 2017-03-26 ENCOUNTER — Encounter (HOSPITAL_COMMUNITY): Payer: Self-pay | Admitting: Occupational Therapy

## 2017-03-26 DIAGNOSIS — R278 Other lack of coordination: Secondary | ICD-10-CM | POA: Diagnosis not present

## 2017-03-26 DIAGNOSIS — M6281 Muscle weakness (generalized): Secondary | ICD-10-CM

## 2017-03-26 DIAGNOSIS — R2681 Unsteadiness on feet: Secondary | ICD-10-CM

## 2017-03-26 NOTE — Therapy (Signed)
[place], [place], [address] Phone: [phone]   Fax:  [phone]  Occupational Therapy Reassessment and Treatment (Recertification)  Patient Details  Name: Donna Valentine MRN: [id number] Date of Birth: 11/25/1940 Referring Provider: Dr. Ashok Pall  Encounter Date: 03/26/2017      OT End of Session - 03/26/17 1201    Visit Number 10   Number of Visits 18   Date for OT Re-Evaluation 04/25/17   Authorization Type [employer] Medicare   Authorization Time Period before 18th visit   Authorization - Visit Number 10   Authorization - Number of Visits 18   OT Start Time 1117   OT Stop Time 1158   OT Time Calculation (min) 41 min   Activity Tolerance Patient tolerated treatment well   Behavior During Therapy [facility] for tasks assessed/performed      Past Medical History:  Diagnosis Date  . GBM (glioblastoma multiforme) ([place])     Past Surgical History:  Procedure Laterality Date  . APPENDECTOMY  1994  . APPLICATION OF CRANIAL NAVIGATION N/A 11/28/2016   Procedure: APPLICATION OF CRANIAL NAVIGATION;  Surgeon: Ashok Pall, MD;  Location: [place];  Service: Neurosurgery;  Laterality: N/A;  . CESAREAN SECTION     x3  . CRANIOTOMY Right 11/28/2016   Procedure: STERIOTACTIC PARIETAL CRANIOTOMY FOR RIGHT FRONTAL TUMOR RESECTION WITH BRAINLAB;  Surgeon: Ashok Pall, MD;  Location: [place];  Service: Neurosurgery;  Laterality: Right;    There were no vitals filed for this visit.      Subjective Assessment - 03/26/17 1201    Subjective  S: My right hand is much better, the left still has tremors a lot.    Currently in Pain? No/denies            [facility] OT Assessment - 03/26/17 1121      Assessment   Diagnosis LUE Weakness S/P removal of right frontal lobe glioblastoma multiforme     Precautions   Precautions Other (comment)   Precaution Comments brain tumor, hx craniotomy, on active radiation      Coordination    Right 9 Hole Peg Test 29.46"  30"   Left 9 Hole Peg Test 1'35"  1'15" previous     AROM   Left Shoulder Flexion 130 Degrees  same as previous   Left Shoulder ABduction 128 Degrees  125 previous   Left Shoulder Internal Rotation 55 Degrees  same as previous   Left Shoulder External Rotation 75 Degrees  same as previous     Strength   Strength Assessment Site Shoulder;Elbow   Right/Left Shoulder Left   Right Shoulder Flexion --   Right Shoulder ABduction --   Right Shoulder Internal Rotation --   Right Shoulder External Rotation --   Left Shoulder Flexion 4-/5  same as previous   Left Shoulder ABduction 4-/5  same as previous   Left Shoulder Internal Rotation 4-/5  same as previous   Left Shoulder External Rotation 4-/5  same as previous   Left Elbow Flexion 4/5  4-/5 previous   Left Elbow Extension 4/5  4-/5 previous   Right/Left hand Right;Left   Right Hand Grip (lbs) 52  45 previous   Right Hand Lateral Pinch 14 lbs  same as previous   Right Hand 3 Point Pinch 14 lbs  15 previous   Left Hand Grip (lbs) 40  same as previous   Left Hand Lateral Pinch 8 lbs  10 previous   Left  Hand 3 Point Pinch 9 lbs  10 previous                  OT Treatments/Exercises (OP) - 03/26/17 1134      Exercises   Exercises Shoulder;Hand     Shoulder Exercises: Seated   Other Seated Exercises green 65cm therapy ball: protraction, flexion, circles both directions, 10X each      Hand Exercises   Hand Gripper with Large Beads 3/6 gripper at 29#; 3/6 gripper at 25#   Hand Gripper with Medium Beads 2 beads at 25#   Other Hand Exercises Attempted pinch tree with left hand, unable to complete due to tremors                OT Short Term Goals - 03/26/17 1133      OT SHORT TERM GOAL #1   Title Patient will be educated and independent with an HEP for improved functional use of LUE with all daily tasks.    Time 6   Period Weeks   Status On-going     OT SHORT  TERM GOAL #2   Title Patient will return to prior level of function, using her left upper extremity actively with all tasks.    Time 6   Period Weeks   Status On-going     OT SHORT TERM GOAL #3   Title Patient will improve left shoulder A/ROM to WNL for improved ability to reach into overhead cabinets.    Time 6   Period Weeks   Status On-going     OT SHORT TERM GOAL #4   Title Patient will improve left upper extremity strength to 4+/5 for improved ability to lift shopping bags.    Time 6   Period Weeks   Status Partially Met     OT SHORT TERM GOAL #5   Title Patient will improve left grip strength by 10 pounds and pinch strength by 6 pounds or more for improved ability to open containers and maintain grip on objects.     Time 6   Period Weeks   Status On-going     OT SHORT TERM GOAL #6   Title Patient will improve fine motor coordination needed to fasten jewelery by decreasing completion time on nine hole peg test by 10 seconds.    Time 6   Period Weeks   Status On-going     OT SHORT TERM GOAL #7   Title Patient will decrease non purposeful tremors from moderate to minimal for improved abilty to complete daily tasks safely.    Baseline 5/18: Goal deferred at this time due to unknown source of tremors.   Time 6   Period Weeks   Status Deferred                  Plan - 03/26/17 1201    Clinical Impression Statement A: Reassessment completed this session, pt has made minimal progress towards goals, however attendance at therapy has been infrequent due to scheduling issues. Pt reports she is able to complete tasks around the house with greater ease as her new medication has reduced her tremors, especially in the RUE. Pt has new schedule with OT/PT 2x/week and is planning to attend appointments weekly and work on HEP at home. Pt would benefit from continued to skilled OT services to focus on LUE strengthening including grip and pinch strengthening, as well as RUE  coordination tasks. Continue appointments 2x/week for 4 weeks, if no progress at next  reassessment discharge pt. Pt is in agreement and understands plan.    Plan P: Work on improving LUE strength and provide theraband HEP if pt is able to complete with good form.    Consulted and Agree with Plan of Care Patient      Patient will benefit from skilled therapeutic intervention in order to improve the following deficits and impairments:  Decreased coordination, Decreased range of motion, Decreased strength, Impaired UE functional use  Visit Diagnosis: Other lack of coordination  Muscle weakness (generalized)    Problem List Patient Active Problem List   Diagnosis Date Noted  . Oral candida 01/08/2017  . Goals of care, counseling/discussion   . Glioblastoma multiforme of frontal lobe ([place]) 12/01/2016  . Brain tumor ([place]) 11/28/2016  . 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 Left-sided weakness 11/20/2016  . 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 HYPERCHOLESTEROLEMIA, BORDERLINE 07/28/2008  . ANEMIA, MILD 07/28/2008  . DIVERTICULOSIS OF COLON 07/28/2008  . HEMATURIA, HX OF 07/28/2008  . COLONIC POLYPS 07/27/2008  . RIGHT BUNDLE BRANCH BLOCK 07/27/2008  . PREMATURE ATRIAL CONTRACTIONS 07/27/2008  . HEMORRHOIDS 07/27/2008  . VENOUS INSUFFICIENCY 07/27/2008  . BACK PAIN, LUMBAR 07/27/2008  . SCOLIOSIS 07/27/2008  . PALPITATIONS, HX OF 07/27/2008   Guadelupe Sabin, OTR/L  [phone] 03/26/2017, 12:06 PM  [facility] [address] [place], [place], [address] Phone: [phone]   Fax:  [phone]  Name: Donna Valentine MRN: [id number] Date of Birth: [birth year]

## 2017-03-26 NOTE — Telephone Encounter (Signed)
Telephone encounter.

## 2017-03-26 NOTE — Patient Instructions (Signed)
   BRIDGING  While lying on your back, tighten your lower abdominals, squeeze your buttocks and then raise your buttocks off the floor/bed as creating a "Bridge" with your body.   Hold for 3 seconds and then lower yourself back to the table.  Repeat 15 times, 1-2 times per day.    HIP ABDUCTION - SIDELYING  While lying on your side, slowly raise up your top leg to the side. Keep your knee straight and maintain your toes pointed forward the entire time. Keep your leg in-line with your body.  The bottom leg can be bent to stabilize your body.  Make sure your hips are not rolling backwards during this exercise!   Repeat 15 times each leg, 1-2 times per day.

## 2017-03-26 NOTE — Therapy (Signed)
[facility] [address] [place], [place], [address] Phone: [phone]   Fax:  [phone]  Physical Therapy Treatment  Patient Details  Name: Donna Valentine MRN: [id number] Date of Birth: 1940-10-25 Referring Provider: Ashok Pall   Encounter Date: 03/26/2017      PT End of Session - 03/26/17 1153    Visit Number 17   Number of Visits 24   Date for PT Re-Evaluation 04/21/17   Authorization Type [employer] Medicare (G-codes and KX); G-codes done 2023/01/03 session    Authorization - Visit Number 16   Authorization - Number of Visits 25   PT Start Time 8242   PT Stop Time 1105  Nustep not included in billing; patient in restroom    PT Time Calculation (min) 31 min   Activity Tolerance Patient tolerated treatment well   Behavior During Therapy [facility] for tasks assessed/performed      Past Medical History:  Diagnosis Date  . GBM (glioblastoma multiforme) ([place])     Past Surgical History:  Procedure Laterality Date  . APPENDECTOMY  1994  . APPLICATION OF CRANIAL NAVIGATION N/A 11/28/2016   Procedure: APPLICATION OF CRANIAL NAVIGATION;  Surgeon: Ashok Pall, MD;  Location: [place];  Service: Neurosurgery;  Laterality: N/A;  . CESAREAN SECTION     x3  . CRANIOTOMY Right 11/28/2016   Procedure: STERIOTACTIC PARIETAL CRANIOTOMY FOR RIGHT FRONTAL TUMOR RESECTION WITH BRAINLAB;  Surgeon: Ashok Pall, MD;  Location: [place];  Service: Neurosurgery;  Laterality: Right;    There were no vitals filed for this visit.      Subjective Assessment - 03/26/17 1036    Subjective Patient arrives with no major changes, fatigue 5/10 and she is starting another round of chemo on Sunday    Pertinent History glioblastoma of R frontal lobe, craniotomy done on 11/28/16   Patient Stated Goals improve strength, enhance independence    Currently in Pain? No/denies                         [facility] Adult PT Treatment/Exercise - 03/26/17 0001      Knee/Hip Exercises: Aerobic   Nustep Nustep level 0 high intensity x5 minutes 100SPM   not included in billing      Knee/Hip Exercises: Standing   Other Standing Knee Exercises attempted hip hikes, difficulty with form despite multimodal cues    Other Standing Knee Exercises high intensity gait intervals x4 in 16f hallway, encouragement for max effort      Knee/Hip Exercises: Supine   Bridges Limitations 3x15  first set B LEs, 2nd and 3rd sets staggered    Straight Leg Raises Both;1 set;10 reps   Straight Leg Raises Limitations eccentric lower/core squeeze      Knee/Hip Exercises: Sidelying   Hip ABduction Both;1 set;15 reps   Hip ABduction Limitations cues for form      Knee/Hip Exercises: Prone   Straight Leg Raises Both;1 set;10 reps   Straight Leg Raises Limitations cues for form              Balance Exercises - 03/26/17 1107      Balance Exercises: Standing   Tandem Stance Eyes open;Foam/compliant surface;2 reps  UE raise of bar    Other Standing Exercises standing marches on foam 1x10 with core and butt squeeze            PT Education - 03/26/17 1153    Education provided Yes   Education Details  HEP update    Person(s) Educated Patient   Methods Explanation;Handout   Comprehension Verbalized understanding;Returned demonstration;Need further instruction          PT Short Term Goals - 03/24/17 1140      PT SHORT TERM GOAL #1   Title Patient to be able to identify 5/5 safety factors/concepts in order to demonstrate good safety and reduced fall risk    Baseline 6/12- 2/5   Time 2   Period Weeks   Status Partially Met     PT SHORT TERM GOAL #2   Title Patient to be able to ambulate over 736f in 3MWT in order to demonstrate improved general mobility and community access    Baseline 6/12- 626   Time 2   Period Weeks   Status On-going     PT SHORT TERM GOAL #3   Title Patient to be independent in correctly and consistently performing targeted HEP,  to be updated as appropriate    Baseline 6/12- walks, somewhat compliant with other exercises   Period Weeks   Status Partially Met     PT SHORT TERM GOAL #4   Title Patient to be performing regular progressive walking program, at least 10-15 minutes in duration and 1-2 times per day, in order to assist in addressing fatigue and functional weakness    Baseline 6/12- has not walked this much per day, but does walk daily    Time 3   Period Weeks   Status On-going           PT Long Term Goals - 03/24/17 1143      PT LONG TERM GOAL #1   Title Patient to demonstrate functional strength 5/5 in all tested muscle groups in order to improve overall gait and balance    Baseline 6/12- proximal weakness    Time 4   Period Weeks   Status Partially Met     PT LONG TERM GOAL #2   Title Patient to score 24 on DGI in order to show improved dynamic balance skills and overall reduced fall risk    Baseline 6/12- 23/24    Time 4   Period Weeks   Status Partially Met     PT LONG TERM GOAL #3   Title Patient to be participatory in regular aerobic exercise program, at least 20 minutes in duration and 4 days per week, in order to maintain functional gains and imrpove overall health status    Baseline 6/12- discussed today    Time 4   Period Weeks   Status On-going               Plan - 03/26/17 1154    Clinical Impression Statement Patient arrives today stating she is doing well but she is starting her second round of chemo on Sunday. Focused on proximal strengthening on mat table as proximal musculature was quite weak at recent re-assessment, otherwise continued with  balance training; attempted high intensity gait intervals but patient with difficulty in attempting max effort despite cues, performed high intensity intervals on Nustep instead today (not included in billing). HEP updates provided this session.    Rehab Potential Good   Clinical Impairments Affecting Rehab Potential (+) high  PLOF, very active at baseline, good recovery after craniotomy; (-) may be starting radiation/chemo soon/effects of these treatments on tolerance to rehab    PT Frequency 2x / week   PT Duration 4 weeks   PT Treatment/Interventions ADLs/Self Care Home Management;Biofeedback;Gait training;Stair training;Functional mobility  training;Therapeutic activities;Therapeutic exercise;Balance training;Neuromuscular re-education;Patient/family education;Manual techniques;Energy conservation;Taping   PT Next Visit Plan High intensity work on [employer], introduce TM. FOcus on proximal strength, hip hikes in supine. Advanced balance. Independent exercise program, see if daughter can come sometime.    PT Home Exercise Plan Eval: sit to stand no UEs, seated clams with red and green TB, SLS, tandem stance 5/16: progressive walking program; 5/18: Given STAR booklet for education for fatgiue and documentation of walking program 6/14: bridges wth hold, hip ABD sidelying    Consulted and Agree with Plan of Care Patient      Patient will benefit from skilled therapeutic intervention in order to improve the following deficits and impairments:  Abnormal gait, Decreased coordination, Decreased mobility, Decreased activity tolerance, Decreased strength, Decreased balance, Difficulty walking  Visit Diagnosis: Other lack of coordination  Muscle weakness (generalized)  Unsteadiness on feet     Problem List Patient Active Problem List   Diagnosis Date Noted  . Oral candida 01/08/2017  . Goals of care, counseling/discussion   . Glioblastoma multiforme of frontal lobe ([place]) 12/01/2016  . Brain tumor ([place]) 11/28/2016  . 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 Left-sided weakness 11/20/2016  . 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 HYPERCHOLESTEROLEMIA, BORDERLINE 07/28/2008  . ANEMIA, MILD 07/28/2008  . DIVERTICULOSIS OF COLON 07/28/2008  . HEMATURIA, HX OF 07/28/2008  .  COLONIC POLYPS 07/27/2008  . RIGHT BUNDLE BRANCH BLOCK 07/27/2008  . PREMATURE ATRIAL CONTRACTIONS 07/27/2008  . HEMORRHOIDS 07/27/2008  . VENOUS INSUFFICIENCY 07/27/2008  . BACK PAIN, LUMBAR 07/27/2008  . SCOLIOSIS 07/27/2008  . PALPITATIONS, HX OF 07/27/2008    Deniece Ree PT, DPT [facility] [address] [place], [place], [address] Phone: [phone]   Fax:  [phone]  Name: Madiha Bambrick MRN: [id number] Date of Birth: Jan 19, 1941

## 2017-03-31 ENCOUNTER — Encounter (HOSPITAL_COMMUNITY): Payer: Self-pay

## 2017-03-31 ENCOUNTER — Ambulatory Visit (HOSPITAL_COMMUNITY): Payer: Medicare Other | Admitting: Physical Therapy

## 2017-03-31 ENCOUNTER — Ambulatory Visit (HOSPITAL_COMMUNITY): Payer: Medicare Other

## 2017-03-31 DIAGNOSIS — R2681 Unsteadiness on feet: Secondary | ICD-10-CM

## 2017-03-31 DIAGNOSIS — R278 Other lack of coordination: Secondary | ICD-10-CM

## 2017-03-31 DIAGNOSIS — M6281 Muscle weakness (generalized): Secondary | ICD-10-CM

## 2017-03-31 NOTE — Therapy (Signed)
[place], [place], [address] Phone: [phone]   Fax:  [phone]  Physical Therapy Treatment  Patient Details  Name: Donna Valentine MRN: [id number] Date of Birth: Oct 11, 1941 Referring Provider: Ashok Pall   Encounter Date: 03/31/2017      PT End of Session - 03/31/17 1512    Visit Number 18   Number of Visits 24   Date for PT Re-Evaluation 04/21/17   Authorization Type [employer] Medicare (G-codes and KX); G-codes done 12-29-22 session    Authorization Time Period recert completed 5/63; 8/7-5/64; recert done 3/32   Authorization - Visit Number 18   Authorization - Number of Visits 25   PT Start Time 1440  Nustep not included in billing    PT Stop Time 1511   PT Time Calculation (min) 31 min   Activity Tolerance Patient tolerated treatment well   Behavior During Therapy [facility] for tasks assessed/performed      Past Medical History:  Diagnosis Date  . GBM (glioblastoma multiforme) ([place])     Past Surgical History:  Procedure Laterality Date  . APPENDECTOMY  1994  . APPLICATION OF CRANIAL NAVIGATION N/A 11/28/2016   Procedure: APPLICATION OF CRANIAL NAVIGATION;  Surgeon: Ashok Pall, MD;  Location: [place];  Service: Neurosurgery;  Laterality: N/A;  . CESAREAN SECTION     x3  . CRANIOTOMY Right 11/28/2016   Procedure: STERIOTACTIC PARIETAL CRANIOTOMY FOR RIGHT FRONTAL TUMOR RESECTION WITH BRAINLAB;  Surgeon: Ashok Pall, MD;  Location: [place];  Service: Neurosurgery;  Laterality: Right;    There were no vitals filed for this visit.      Subjective Assessment - 03/31/17 1444    Subjective patient arrives after OT stating she is doing well, her fatigue is around 7/10 today and she is currently in a course of chemo    Pertinent History glioblastoma of R frontal lobe, craniotomy done on 11/28/16   Patient Stated Goals improve strength, enhance independence    Currently in Pain? No/denies                          [facility] Adult PT Treatment/Exercise - 03/31/17 1445      Knee/Hip Exercises: Aerobic   Nustep Nustep level 2 x8 minutesat least 80SPM   not included in biling      Knee/Hip Exercises: Standing   Forward Step Up Both;1 set;10 reps;Step Height: 4"   Forward Step Up Limitations 4 inch box, 5# B      Knee/Hip Exercises: Seated   Sit to Sand 10 reps;without UE support;2 sets  5# 1st set, 8# 2nd set      Knee/Hip Exercises: Supine   Bridges Limitations 2x20  staggered positions    Straight Leg Raises Both;1 set;15 reps   Straight Leg Raises Limitations eccentric lower/core squeeze      Knee/Hip Exercises: Sidelying   Hip ABduction Both;1 set;15 reps   Hip ABduction Limitations cues for form      Prone hip extensions, knee bent 1x10 B            PT Education - 03/31/17 1512    Education provided Yes   Education Details imprtance of regualar exercise/walking program during cancer treatemnts, please bring daughter soon for education/guidance on exercise    Person(s) Educated Patient   Methods Explanation   Comprehension Verbalized understanding          PT Short Term Goals - 03/24/17 1140  PT SHORT TERM GOAL #1   Title Patient to be able to identify 5/5 safety factors/concepts in order to demonstrate good safety and reduced fall risk    Baseline 6/12- 2/5   Time 2   Period Weeks   Status Partially Met     PT SHORT TERM GOAL #2   Title Patient to be able to ambulate over 790f in 3MWT in order to demonstrate improved general mobility and community access    Baseline 6/12- 626   Time 2   Period Weeks   Status On-going     PT SHORT TERM GOAL #3   Title Patient to be independent in correctly and consistently performing targeted HEP, to be updated as appropriate    Baseline 6/12- walks, somewhat compliant with other exercises   Period Weeks   Status Partially Met     PT SHORT TERM GOAL #4   Title Patient to be performing  regular progressive walking program, at least 10-15 minutes in duration and 1-2 times per day, in order to assist in addressing fatigue and functional weakness    Baseline 6/12- has not walked this much per day, but does walk daily    Time 3   Period Weeks   Status On-going           PT Long Term Goals - 03/24/17 1143      PT LONG TERM GOAL #1   Title Patient to demonstrate functional strength 5/5 in all tested muscle groups in order to improve overall gait and balance    Baseline 6/12- proximal weakness    Time 4   Period Weeks   Status Partially Met     PT LONG TERM GOAL #2   Title Patient to score 24 on DGI in order to show improved dynamic balance skills and overall reduced fall risk    Baseline 6/12- 23/24    Time 4   Period Weeks   Status Partially Met     PT LONG TERM GOAL #3   Title Patient to be participatory in regular aerobic exercise program, at least 20 minutes in duration and 4 days per week, in order to maintain functional gains and imrpove overall health status    Baseline 6/12- discussed today    Time 4   Period Weeks   Status On-going               Plan - 03/31/17 1513    Clinical Impression Statement Began session on Nustep with goal of at least 80SPM for reciprocal pattern and functional activity training;  patient reports fatigue 7/10 today and confirms she is currently on active course of chemo right now. Session intensity adjusted as needed based on high fatigue level today. Focused mostly on proximal strength as this was one of patient's biggest deficits noted at last re-assessment, also introduced loaded/weighted CKC exercise to assist in loading proximal musculature during standing tasks. Requested that patient please ask her daughter to attend an upcoming session to discuss independent exercise/educate regarding role of exercise in reducing fatigue during cancer treatments.    Rehab Potential Good   Clinical Impairments Affecting Rehab  Potential (+) high PLOF, very active at baseline, good recovery after craniotomy; (-) may be starting radiation/chemo soon/effects of these treatments on tolerance to rehab    PT Frequency 2x / week   PT Duration 4 weeks   PT Treatment/Interventions ADLs/Self Care Home Management;Biofeedback;Gait training;Stair training;Functional mobility training;Therapeutic activities;Therapeutic exercise;Balance training;Neuromuscular re-education;Patient/family education;Manual techniques;Energy conservation;Taping   PT  Next Visit Plan cointinue weighted exercise, continue proximal strength, continue high intensity Nustep. Introduce Nustep. Educate daughter.    PT Home Exercise Plan Eval: sit to stand no UEs, seated clams with red and green TB, SLS, tandem stance 5/16: progressive walking program; 5/18: Given STAR booklet for education for fatgiue and documentation of walking program 6/14: bridges wth hold, hip ABD sidelying    Consulted and Agree with Plan of Care Patient      Patient will benefit from skilled therapeutic intervention in order to improve the following deficits and impairments:  Abnormal gait, Decreased coordination, Decreased mobility, Decreased activity tolerance, Decreased strength, Decreased balance, Difficulty walking  Visit Diagnosis: Other lack of coordination  Muscle weakness (generalized)  Unsteadiness on feet     Problem List Patient Active Problem List   Diagnosis Date Noted  . Oral candida 01/08/2017  . Goals of care, counseling/discussion   . Glioblastoma multiforme of frontal lobe ([place]) 12/01/2016  . Brain tumor ([place]) 11/28/2016  . 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 Left-sided weakness 11/20/2016  . 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 HYPERCHOLESTEROLEMIA, BORDERLINE 07/28/2008  . ANEMIA, MILD 07/28/2008  . DIVERTICULOSIS OF COLON 07/28/2008  . HEMATURIA, HX OF 07/28/2008  . COLONIC POLYPS 07/27/2008  .  RIGHT BUNDLE BRANCH BLOCK 07/27/2008  . PREMATURE ATRIAL CONTRACTIONS 07/27/2008  . HEMORRHOIDS 07/27/2008  . VENOUS INSUFFICIENCY 07/27/2008  . BACK PAIN, LUMBAR 07/27/2008  . SCOLIOSIS 07/27/2008  . PALPITATIONS, HX OF 07/27/2008    Deniece Ree PT, DPT [place] [address] [place], [place], [address] Phone: [phone]   Fax:  [phone]  Name: Linsi Humann MRN: [id number] Date of Birth: 07-09-1941

## 2017-03-31 NOTE — Therapy (Signed)
[place], [place], [address] Phone: [phone]   Fax:  [phone]  Occupational Therapy Treatment  Patient Details  Name: Donna Valentine MRN: [id number] Date of Birth: [birth year] Referring Provider: Dr. Ashok Pall  Encounter Date: 03/31/2017      OT End of Session - 03/31/17 1709    Visit Number 11   Number of Visits 18   Date for OT Re-Evaluation 04/25/17   Authorization Type [employer] Medicare   Authorization Time Period before 18th visit   Authorization - Visit Number 11   Authorization - Number of Visits 18   OT Start Time 1346   OT Stop Time 1430   OT Time Calculation (min) 44 min   Activity Tolerance Patient tolerated treatment well   Behavior During Therapy [facility] for tasks assessed/performed      Past Medical History:  Diagnosis Date  . GBM (glioblastoma multiforme) ([place])     Past Surgical History:  Procedure Laterality Date  . APPENDECTOMY  1994  . APPLICATION OF CRANIAL NAVIGATION N/A 11/28/2016   Procedure: APPLICATION OF CRANIAL NAVIGATION;  Surgeon: Ashok Pall, MD;  Location: [place];  Service: Neurosurgery;  Laterality: N/A;  . CESAREAN SECTION     x3  . CRANIOTOMY Right 11/28/2016   Procedure: STERIOTACTIC PARIETAL CRANIOTOMY FOR RIGHT FRONTAL TUMOR RESECTION WITH BRAINLAB;  Surgeon: Ashok Pall, MD;  Location: [place];  Service: Neurosurgery;  Laterality: Right;    There were no vitals filed for this visit.      Subjective Assessment - 03/31/17 1412    Subjective  S: I can notice that even though my left hand is still shaking a lot that it is better.   Pain Score 0-No pain            [facility] OT Assessment - 03/31/17 1347      Assessment   Diagnosis LUE Weakness S/P removal of right frontal lobe glioblastoma multiforme     Precautions   Precautions Other (comment)   Precaution Comments brain tumor, hx craniotomy, on active radiation                   OT  Treatments/Exercises (OP) - 03/31/17 1347      Exercises   Exercises Shoulder;Hand     Shoulder Exercises: Seated   Other Seated Exercises green 65cm therapy ball: protraction, flexion, circles both directions, 10X each      Shoulder Exercises: Standing   Protraction Theraband;10 reps   Theraband Level (Shoulder Protraction) Level 2 (Red)   Horizontal ABduction Theraband;10 reps   Theraband Level (Shoulder Horizontal ABduction) Level 2 (Red)   External Rotation Theraband;10 reps   Theraband Level (Shoulder External Rotation) Level 2 (Red)   Internal Rotation Theraband;10 reps   Theraband Level (Shoulder Internal Rotation) Level 2 (Red)   Flexion Theraband;10 reps   Theraband Level (Shoulder Flexion) Level 2 (Red)   ABduction Theraband;10 reps   Theraband Level (Shoulder ABduction) Level 2 (Red)     Hand Exercises   Large Pegboard Pt completed large pegboard activity using left hand with 3/4# wrist weight following a pattern.   Other Hand Exercises Pt completed pinch tree doing all yellow with left hand and 3/4# wrist weight. Completed all blue and green with right hand and 1/2# wrist weight.                OT Education - 03/31/17 1413    Education provided No  OT Short Term Goals - 03/26/17 1133      OT SHORT TERM GOAL #1   Title Patient will be educated and independent with an HEP for improved functional use of LUE with all daily tasks.    Time 6   Period Weeks   Status On-going     OT SHORT TERM GOAL #2   Title Patient will return to prior level of function, using her left upper extremity actively with all tasks.    Time 6   Period Weeks   Status On-going     OT SHORT TERM GOAL #3   Title Patient will improve left shoulder A/ROM to WNL for improved ability to reach into overhead cabinets.    Time 6   Period Weeks   Status On-going     OT SHORT TERM GOAL #4   Title Patient will improve left upper extremity strength to 4+/5 for improved ability  to lift shopping bags.    Time 6   Period Weeks   Status Partially Met     OT SHORT TERM GOAL #5   Title Patient will improve left grip strength by 10 pounds and pinch strength by 6 pounds or more for improved ability to open containers and maintain grip on objects.     Time 6   Period Weeks   Status On-going     OT SHORT TERM GOAL #6   Title Patient will improve fine motor coordination needed to fasten jewelery by decreasing completion time on nine hole peg test by 10 seconds.    Time 6   Period Weeks   Status On-going     OT SHORT TERM GOAL #7   Title Patient will decrease non purposeful tremors from moderate to minimal for improved abilty to complete daily tasks safely.    Baseline 5/18: Goal deferred at this time due to unknown source of tremors.   Time 6   Period Weeks   Status Deferred                  Plan - 03/31/17 1710    Clinical Impression Statement A: Pt completed shoulder strengthening with theraband and therapy ball with VC needed for form and technique. Pinch tree activity was completed today with tremors present but pt was able to move slowly and control them better than before. Pt started large pegboard task and did not finish it due to time constraint but reported that she really enjoyed this activity.   Plan P: Continue with large pegboard task. Progress to adding theraband HEP after next session if pt completed with good form.      Patient will benefit from skilled therapeutic intervention in order to improve the following deficits and impairments:  Decreased coordination, Decreased range of motion, Decreased strength, Impaired UE functional use  Visit Diagnosis: Other lack of coordination  Muscle weakness (generalized)    Problem List Patient Active Problem List   Diagnosis Date Noted  . Oral candida 01/08/2017  . Goals of care, counseling/discussion   . Glioblastoma multiforme of frontal lobe ([place]) 12/01/2016  . Brain tumor ([place])  11/28/2016  . 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 Left-sided weakness 11/20/2016  . 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 HYPERCHOLESTEROLEMIA, BORDERLINE 07/28/2008  . ANEMIA, MILD 07/28/2008  . DIVERTICULOSIS OF COLON 07/28/2008  . HEMATURIA, HX OF 07/28/2008  . COLONIC POLYPS 07/27/2008  . RIGHT BUNDLE BRANCH BLOCK 07/27/2008  . PREMATURE ATRIAL CONTRACTIONS 07/27/2008  .  HEMORRHOIDS 07/27/2008  . VENOUS INSUFFICIENCY 07/27/2008  . BACK PAIN, LUMBAR 07/27/2008  . SCOLIOSIS 07/27/2008  . PALPITATIONS, HX OF 07/27/2008    Luther Hearing, OT Student [phone] 03/31/2017, 5:17 PM  [place], [place], [address] Phone: [phone]   Fax:  [phone]  Name: Donna Valentine MRN: [id number] Date of Birth: June 16, 1941      This qualified practitioner was present in the room guiding the student in service delivery. Therapy student was participating in the provision of services, and the practitioner was not engaged in treating another patient or doing other tasks at the same time.  [name], OTR/L,CBIS  [phone]

## 2017-04-01 ENCOUNTER — Other Ambulatory Visit: Payer: Self-pay | Admitting: Hematology

## 2017-04-01 NOTE — Progress Notes (Signed)
HEMATOLOGY/ONCOLOGY CLINIC NOTE  Date of Service: .03/25/2017  Patient Care Team: Sharilyn Sites, MD as PCP - General (Family Medicine) Ledon Snare MD (Radiation Oncology) Ashok Pall MD (Neurosurgery)  CHIEF COMPLAINTS/PURPOSE OF CONSULTATION:  f/u GBM  HISTORY OF PRESENTING ILLNESS:   Donna Valentine is a wonderful 76 y.o. female who has been referred to [place] by Dr Ashok Pall, MD  for evaluation and management of newly diagnosed Glioblastoma Multiforme.  Patient has a h/o HLD, PAC's and borderline HTN presented to the ED on 11/20/2016 with sudden onset of left arm and some leg weakness and on CT head R frontoparietal lesion of 2.2 cm w/ vasogenic edema which was further evaluated with MRI. 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 No findings concerning for bleed or infarct.  Patient was treated with high dose steroids and keppra for SZ prophylaxis. Patient subsequently had an uncomplicated right frontal stereotactic craniotomy for tumor resection of the dominant rt frontoparietal mass but not all the satellite lesions (2 were apparently clinically evident as per surgery notes). She has had improvement in her left sided strength since her surgery and he cranial wound has been dry and clean. She is ambulating with walker and eating well. She has been setup for home Physical therapy.  I met with the patient as she was readied to be discharged home. She was seen with several family members at bedside. We discussed the diagnosis , prognosis and standard of care treatment options available and option to go to [facility] for consideration of possible clinical trials. NCCN guidelines were provided and discussed.  Patient has not been evaluated by radiation oncology yet.  Patient notes that she did not have any significant health limitation prior to the  diagnosis of her GBM.  INTERVAL HISTORY  Patient is here with several of her family members for follow-up of her GBM prior to her 2nd cycle of adjuvant temodar. She has been started on primidone and notes that her essential tremors are much improved. She will be starting temodar @ 200ng/m2 in a couple of days. Labs stable. No concerning symptoms for infection. The yeast infection in her breast folds noted on her last clinic visit and treated with nystatin poweder and fluconazole has now resolved.  MEDICAL HISTORY:      Past Medical History:  Diagnosis Date  . Cancer [facility])     SURGICAL HISTORY:      Past Surgical History:  Procedure Laterality Date  . APPENDECTOMY  1994  . APPLICATION OF CRANIAL NAVIGATION N/A 11/28/2016   Procedure: APPLICATION OF CRANIAL NAVIGATION;  Surgeon: Ashok Pall, MD;  Location: [place];  Service: Neurosurgery;  Laterality: N/A;  . CESAREAN SECTION     x3  . CRANIOTOMY Right 11/28/2016   Procedure: STERIOTACTIC PARIETAL CRANIOTOMY FOR RIGHT FRONTAL TUMOR RESECTION WITH BRAINLAB;  Surgeon: Ashok Pall, MD;  Location: [place];  Service: Neurosurgery;  Laterality: Right;    SOCIAL HISTORY: Social History        Social History  . Marital status: Married    Spouse name: N/A  . Number of children: N/A  . Years of education: N/A      Occupational History  . Not on file.        Social History Main Topics  . Smoking status: Former Smoker    Quit date: 11/26/1991  . Smokeless tobacco: Never Used  . Alcohol  use No  . Drug use: No  . Sexual activity: Not on file       Other Topics Concern  . Not on file      Social History Narrative  . No narrative on file    FAMILY HISTORY: History reviewed. No pertinent family history.  ALLERGIES:  is allergic to penicillins and food.  MEDICATIONS: . Current Outpatient Prescriptions on File Prior to Visit  Medication Sig Dispense Refill  . dexamethasone (DECADRON) 4 MG tablet Take  by mouth.    . fluconazole (DIFLUCAN) 100 MG tablet Take 1 tablet (100 mg total) by mouth daily. 7 tablet 0  . levETIRAcetam (KEPPRA) 500 MG tablet Take 1 tablet (500 mg total) by mouth 2 (two) times daily. 60 tablet 5  . nystatin (MYCOSTATIN/NYSTOP) powder Apply topically 3 (three) times daily. Apply powder under the breast skin fold over the rash 30 g 1  . ondansetron (ZOFRAN) 8 MG tablet Take 1 tablet (8 mg total) by mouth every 8 (eight) hours as needed for nausea. 30 tablet 3  . primidone (MYSOLINE) 50 MG tablet Take 0.5 tablets (25 mg total) by mouth at bedtime. 15 tablet 3   No current facility-administered medications on file prior to visit.       REVIEW OF SYSTEMS:    10 Point review of Systems was done is negative except as noted above.  PHYSICAL EXAMINATION: ECOG PERFORMANCE STATUS: 1 - Symptomatic but completely ambulatory .BP 127/74 (BP Location: Left Arm, Patient Position: Sitting)   Pulse 73   Temp 98.6 F (37 C) (Oral)   Resp 20   Ht _0  (1.676 m)   Wt 186 lb 14.4 oz (84.8 kg)   SpO2 100%   BMI 30.17 kg/m   GENERAL:alert, in no acute distress and comfortable SKIN: skin color, texture, turgor are normal, no rashes or significant lesions EYES: normal, conjunctiva are pink and non-injected, sclera clear OROPHARYNX:no exudate, no erythema and lips, buccal mucosa, and tongue normal  NECK: supple, no JVD, thyroid normal size, non-tender, without nodularity LYMPH:  no palpable lymphadenopathy in the cervical, axillary or inguinal LUNGS: clear to auscultation with normal respiratory effort HEART: regular rate & rhythm,  no murmurs and no lower extremity edema ABDOMEN: abdomen soft, non-tender, normoactive bowel sounds  Musculoskeletal: no cyanosis of digits and no clubbing  PSYCH: alert & oriented x 3 with fluent speech NEURO:4/5 LUE strength, 4+/5 LLE strength, 5/5 on rt side UE and LE  LABORATORY DATA:  I have reviewed the data as listed  . CBC Latest  Ref Rng & Units 03/25/2017 03/12/2017 02/17/2017  WBC 3.9 - 10.3 10e3/uL 5.0 7.3 5.1  Hemoglobin 11.6 - 15.9 g/dL 14.4 14.1 13.8  Hematocrit 34.8 - 46.6 % 43.8 42.8 42.8  Platelets 145 - 400 10e3/uL 113(L) 127(L) 170   . CMP Latest Ref Rng & Units 03/25/2017 03/12/2017 02/17/2017  Glucose 70 - 140 mg/dl 84 176(H) 142(H)  BUN 7.0 - 26.0 mg/dL 12.9 14.0 10.7  Creatinine 0.6 - 1.1 mg/dL 0.9 0.8 1.0  Sodium 136 - 145 mEq/L 143 142 143  Potassium 3.5 - 5.1 mEq/L 3.8 4.0 3.9  Chloride 101 - 111 mmol/L - - -  CO2 22 - 29 mEq/L _1 Calcium 8.4 - 10.4 mg/dL 9.4 9.6 9.3  Total Protein 6.4 - 8.3 g/dL 6.9 7.1 7.1  Total Bilirubin 0.20 - 1.20 mg/dL 0.56 0.54 0.58  Alkaline Phos 40 - 150 U/L 75 79 62  AST 5 -  34 U/L 21 28 69(H)  ALT 0 - 55 U/L 24 53 70(H)         RADIOGRAPHIC STUDIES: I have personally reviewed the radiological images as listed and agreed with the findings in the report.  .No results found.  ImagingResults  Dg Chest 2 View  Result Date: 11/19/2016 CLINICAL DATA:  Productive cough for the past 2 weeks. Former smoker. EXAM: CHEST  2 VIEW COMPARISON:  Chest x-ray of July 10, 2010 FINDINGS: The lungs are well-expanded. There is no focal infiltrate. There is a pectus excavatum type chest contour. The heart and pulmonary vascularity are normal. The mediastinum is normal in width. There is no pleural effusion. There is moderate dextrocurvature centered in the lower thoracic spine which is stable. IMPRESSION: Mild chronic bronchitic changes, stable. No pneumonia, pulmonary edema, nor other acute cardiopulmonary abnormality. Electronically Signed   By: David  Martinique M.D.   On: 11/19/2016 14:26   Ct Head Wo Contrast  Result Date: 11/20/2016 CLINICAL DATA:  Left arm weakness. EXAM: CT HEAD WITHOUT CONTRAST TECHNIQUE: Contiguous axial images were obtained from the base of the skull through the vertex without intravenous contrast. COMPARISON:  None. FINDINGS: Brain: Large amount  of edema is identified in the high right frontal parietal region. Appearance is more suggestive of vasogenic edema than cytotoxic edema on this study. Features may be related to a potential mass lesion identified in the high right parietal region measuring 2.2 cm (image 25 series 201). Despite the edema, no subfalcine midline shift is evident. No evidence for hydrocephalus. Vascular: No hyperdense vessel or unexpected calcification. Skull: Normal. Negative for fracture or focal lesion. Sinuses/Orbits: No acute finding. Other: None. IMPRESSION: 1. Relatively large volume of apparent vasogenic edema in the high right frontoparietal region, likely secondary to a 2.2 cm high frontal parietal lobe lesion at the gray-white junction. MRI without and with contrast would be the study of choice to further evaluate. 2. No substantial mass-effect or midline shift. I discussed these findings by telephone with the triage nurse Valarie Merino) at [place] hours on 05/24/2017. Electronically Signed   By: Misty Stanley M.D.   On: 11/20/2016 18:12   Ct Head W Contrast  Result Date: 11/21/2016 CLINICAL DATA:  76 year old female. Study for stereotactic surgical planning. 2.2 cm enhancing right superior frontal lobe mass and surrounding edema vs. nonenhancing tumor. Initial encounter. EXAM: CT HEAD WITH CONTRAST TECHNIQUE: Contiguous axial images were obtained from the base of the skull through the vertex with intravenous contrast. CONTRAST:  75 mL Isovue-300 COMPARISON:  Brain MRI without and with contrast and noncontrast head CT 11/20/2016 FINDINGS: Brain: The roughly 2 cm round enhancing mass at the right superior frontal gyrus is seen on series 3, image 138. The more faint nodular enhancement anterior to the lesion is not evident by CT. The surrounding white matter hypodensity which on MRI most resembled vasogenic edema is stable. Mild regional mass effect with no midline shift. No ventriculomegaly. No loss basilar cisterns. Elsewhere  gray-white matter differentiation is within normal limits. No acute intracranial hemorrhage identified. No other abnormal intracranial enhancement. Vascular: Major intracranial vascular structures are enhancing. Skull: No osseous abnormality identified. Sinuses/Orbits: Visualized paranasal sinuses and mastoids are stable and well pneumatized. Other: Negative orbit and scalp soft tissues. IMPRESSION: 1. Study for stereotactic surgical planning. 2. Enhancing mass in the right superior frontal gyrus is on series 3, image 138. Surrounding white matter hypodensity most suggestive of vasogenic edema is stable with mild regional mass effect but no midline shift.  3. No new intracranial abnormality. Electronically Signed   By: Genevie Ann M.D.   On: 11/21/2016 13:52   Ct Head W Wo Contrast  Result Date: 11/29/2016 CLINICAL DATA:  76 year old female status post resection of right superior frontal lobe tumor, postop day 1 Initial encounter. EXAM: CT HEAD WITHOUT AND WITH CONTRAST TECHNIQUE: Contiguous axial images were obtained from the base of the skull through the vertex without and with intravenous contrast CONTRAST:  42m ISOVUE-300 IOPAMIDOL (ISOVUE-300) INJECTION 61% COMPARISON:  Preoperative stereotactic CT 11/21/2016 and earlier. FINDINGS: Brain: Small volume postoperative pneumocephalus. Small volume of gas and blood products in the right superior frontal gyrus resection cavity (series 2, image 23). Following contrast, no mass like enhancement is evident. Surrounding white matter hypodensity with mild mass effect tracking into the more inferior right frontal lobe is stable. There is trace leftward midline shift and mild mass effect on the right lateral ventricle now. No ventriculomegaly. No other intracranial hemorrhage identified. Basilar cisterns remain patent. No other abnormal intracranial enhancement. Vascular: Major intracranial vascular structures appear to be normally enhancing. Skull: Sequelae of right  frontoparietal craniotomy. No other acute osseous abnormality. Sinuses/Orbits: Visualized paranasal sinuses and mastoids are stable and well pneumatized. Other: Mild right scalp postoperative changes from craniotomy. Skin staples in place. No other acute orbit or scalp soft tissue finding. IMPRESSION: 1. Status post right superior frontal gyrus tumor resection with no adverse features. Stable right frontal lobe edema or nonenhancing tumor. No residual masslike enhancement is evident by post-contrast CT. 2. Mild postoperative intracranial mass effect with trace leftward midline shift. Electronically Signed   By: HGenevie AnnM.D.   On: 11/29/2016 16:44   Mr BJeri CosWVEContrast  Result Date: 11/20/2016 CLINICAL DATA:  LEFT-sided weakness for 1 day, assess for stroke. EXAM: MRI HEAD WITHOUT AND WITH CONTRAST TECHNIQUE: Multiplanar, multiecho pulse sequences of the brain and surrounding structures were obtained without and with intravenous contrast. CONTRAST:  16mMULTIHANCE GADOBENATE DIMEGLUMINE 529 MG/ML IV SOLN COMPARISON:  CT HEAD November 20, 2016 at 1752 hours FINDINGS: INTRACRANIAL CONTENTS: Ring-enhancing RIGHT frontal lobe convexity intraparenchymal 2.2 x 1.6 x 1.9 cm mass extending to the dura, central subcentimeter susceptibility artifact which could reflect mineralization or hemosiderin. Corresponding reduced diffusion and low ADC values. At least 4 additional subcentimeter satellite nodules anterior to the dominant lesion in the RIGHT frontal lobe juxta cortical white matter. Extensive surrounding T2 bright vasogenic edema and regional sulcal effacement. No midline shift. Ventricles and sulci are normal for patient's age. Patchy supratentorial white matter FLAIR T2 hyperintensities exclusive of the aforementioned abnormality compatible with mild to moderate chronic small vessel ischemic disease. No abnormal extra-axial enhancement. VASCULAR: Normal major intracranial vascular flow voids present at skull  base. SKULL AND UPPER CERVICAL SPINE: No abnormal sellar expansion. No suspicious calvarial bone marrow signal. Craniocervical junction maintained. SINUSES/ORBITS: The mastoid air-cells and included paranasal sinuses are well-aerated.The included ocular globes and orbital contents are non-suspicious. OTHER: None. IMPRESSION: Dominant 2.2 x 1.6 x 1.9 cm RIGHT frontal lobe mass with subcentimeter satellite nodules within extensive vasogenic edema. 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 No infarct.  Mild chronic small vessel ischemic disease. Acute findings discussed with and reconfirmed by Dr.ROBERT LOCKWOOD on 11/20/2016 at 8:25 pm. Electronically Signed   By: CoElon Alas.D.   On: 11/20/2016 20:28     ASSESSMENT & PLAN:   [id number] previously healthy caucasian female with no significant Chronic medical co-mrobidities  with   1) Newly diagnosed Rt fronto-parietal GBM with a few satellite lesions. 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 (Neurosurgery - Dr Christella Noa). Patient has completed concurrent chemo-radiation on 02/06/2017 and tolerated it well. Blood counts stable -MRI brain results from 02/13/2017 were discussed in details -- some increased enhancement at the site of treatment is thought to represent treatment effect presenting as pseudo-progression. PLan -labs are stable and patient feels well  -she is appropriate to start 2nd cycle of temodar to 238m/m2  -on keppra for SZ prophylaxis. -wean of dexamethasone as per [facility] team -- it was reintroduced by them. -MRI brain scheduled for mid July.  2)candida intertrigo under breast -- resolved with fluconazole and nystatin powder.  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 ?Worsened by anxiety and treatment. Now off steroids. Thyroid function tests WNL Plan -much improved on Primidone started by Dr PPosey Pronto -ativan prn  4) Abnormal LFts - likely from temodar. Resolved  Plz schedule MRI brain as per LOS placed last visit mid July RTC with Dr KIrene Limbowith labs and MRI brain  on 7/17 9  -All of the patients questions were answered with apparent satisfaction. The patient knows to call the clinic with any problems, questions or concerns.  I spent 20 minutes counseling the patient face to face. The total time spent in the appointment was 25 minutes and more than 50% was on counseling and direct patient cares    GSullivan LoneMD MOzoraAAHIVMS SAdvocate Christ Hospital & Medical CenterCPenn Medicine At Radnor Endoscopy FacilityHematology/Oncology Physician CJones [facility] (Office):       [phone](Work cell):  [phone](Fax):           [phone]

## 2017-04-01 NOTE — Progress Notes (Signed)
HEMATOLOGY/ONCOLOGY CLINIC NOTE  Date of Service: .03/12/2017  Patient Care Team: Sharilyn Sites, MD as PCP - General (Family Medicine) Ledon Snare MD (Radiation Oncology) Ashok Pall MD (Neurosurgery)  CHIEF COMPLAINTS/PURPOSE OF CONSULTATION:  f/u GBM  HISTORY OF PRESENTING ILLNESS:   Donna Valentine is a wonderful 76 y.o. female who has been referred to [place] by Dr Ashok Pall, MD  for evaluation and management of newly diagnosed Glioblastoma Multiforme.  Patient has a h/o HLD, PAC's and borderline HTN presented to the ED on 11/20/2016 with sudden onset of left arm and some leg weakness and on CT head R frontoparietal lesion of 2.2 cm w/ vasogenic edema which was further evaluated with MRI. 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 No findings concerning for bleed or infarct.  Patient was treated with high dose steroids and keppra for SZ prophylaxis. Patient subsequently had an uncomplicated right frontal stereotactic craniotomy for tumor resection of the dominant rt frontoparietal mass but not all the satellite lesions (2 were apparently clinically evident as per surgery notes). She has had improvement in her left sided strength since her surgery and he cranial wound has been dry and clean. She is ambulating with walker and eating well. She has been setup for home Physical therapy.  I met with the patient as she was readied to be discharged home. She was seen with several family members at bedside. We discussed the diagnosis , prognosis and standard of care treatment options available and option to go to [facility] for consideration of possible clinical trials. NCCN guidelines were provided and discussed.  Patient has not been evaluated by radiation oncology yet.  Patient notes that she did not have any significant health limitation prior to the  diagnosis of her GBM.  INTERVAL HISTORY  Patient is here with several of her family members for follow-up of her GBM. She has started Adjuvant temodar and is s/p 1st cycle of treatment and is here for a toxicity check. She notes that given her eseential tremors she was placed back on some dexamethasone as per her neuro-oncologist at [facility]. Notes that she has seen neurology - Dr Narda Amber and is exploring other treatment options. Labs today are stable expect mild thrombocytopenia with platelets of 127k. We discussed that she is need rpt labs prior to 2nd cycle of treatment with temodar.   MEDICAL HISTORY:      Past Medical History:  Diagnosis Date  . Cancer [facility])     SURGICAL HISTORY:      Past Surgical History:  Procedure Laterality Date  . APPENDECTOMY  1994  . APPLICATION OF CRANIAL NAVIGATION N/A 11/28/2016   Procedure: APPLICATION OF CRANIAL NAVIGATION;  Surgeon: Ashok Pall, MD;  Location: [place];  Service: Neurosurgery;  Laterality: N/A;  . CESAREAN SECTION     x3  . CRANIOTOMY Right 11/28/2016   Procedure: STERIOTACTIC PARIETAL CRANIOTOMY FOR RIGHT FRONTAL TUMOR RESECTION WITH BRAINLAB;  Surgeon: Ashok Pall, MD;  Location: [place];  Service: Neurosurgery;  Laterality: Right;    SOCIAL HISTORY: Social History        Social History  . Marital status: Married    Spouse name: N/A  . Number of children: N/A  . Years of education: N/A      Occupational History  . Not on file.        Social History Main Topics  . Smoking  status: Former Smoker    Quit date: 11/26/1991  . Smokeless tobacco: Never Used  . Alcohol use No  . Drug use: No  . Sexual activity: Not on file       Other Topics Concern  . Not on file      Social History Narrative  . No narrative on file    FAMILY HISTORY: History reviewed. No pertinent family history.  ALLERGIES:  is allergic to penicillins and food.  MEDICATIONS: . Current Outpatient Prescriptions on  File Prior to Visit  Medication Sig Dispense Refill  . dexamethasone (DECADRON) 4 MG tablet Take by mouth.    . levETIRAcetam (KEPPRA) 500 MG tablet Take 1 tablet (500 mg total) by mouth 2 (two) times daily. 60 tablet 5  . ondansetron (ZOFRAN) 8 MG tablet Take 1 tablet (8 mg total) by mouth every 8 (eight) hours as needed for nausea. 30 tablet 3   No current facility-administered medications on file prior to visit.       REVIEW OF SYSTEMS:    10 Point review of Systems was done is negative except as noted above.  PHYSICAL EXAMINATION: ECOG PERFORMANCE STATUS: 1 - Symptomatic but completely ambulatory .BP 136/60 (BP Location: Left Arm, Patient Position: Sitting)   Pulse 72   Temp 98.6 F (37 C) (Oral)   Resp 18   Ht _0  (1.676 m)   Wt 187 lb 8 oz (85 kg)   SpO2 98%   BMI 30.26 kg/m   GENERAL:alert, in no acute distress and comfortable SKIN: skin color, texture, turgor are normal, no rashes or significant lesions EYES: normal, conjunctiva are pink and non-injected, sclera clear OROPHARYNX:no exudate, no erythema and lips, buccal mucosa, and tongue normal  NECK: supple, no JVD, thyroid normal size, non-tender, without nodularity LYMPH:  no palpable lymphadenopathy in the cervical, axillary or inguinal LUNGS: clear to auscultation with normal respiratory effort HEART: regular rate & rhythm,  no murmurs and no lower extremity edema ABDOMEN: abdomen soft, non-tender, normoactive bowel sounds  Musculoskeletal: no cyanosis of digits and no clubbing  PSYCH: alert & oriented x 3 with fluent speech NEURO:4/5 LUE strength, 4+/5 LLE strength, 5/5 on rt side UE and LE  LABORATORY DATA:  I have reviewed the data as listed  . CBC Latest Ref Rng & Units 03/12/2017 02/17/2017  WBC 3.9 - 10.3 10e3/uL 7.3 5.1  Hemoglobin 11.6 - 15.9 g/dL 14.1 13.8  Hematocrit 34.8 - 46.6 % 42.8 42.8  Platelets 145 - 400 10e3/uL 127(L) 170   . CMP Latest Ref Rng & Units 03/12/2017 02/17/2017    Glucose 70 - 140 mg/dl 176(H) 142(H)  BUN 7.0 - 26.0 mg/dL 14.0 10.7  Creatinine 0.6 - 1.1 mg/dL 0.8 1.0  Sodium 136 - 145 mEq/L 142 143  Potassium 3.5 - 5.1 mEq/L 4.0 3.9  Chloride 101 - 111 mmol/L - -  CO2 22 - 29 mEq/L 24 27  Calcium 8.4 - 10.4 mg/dL 9.6 9.3  Total Protein 6.4 - 8.3 g/dL 7.1 7.1  Total Bilirubin 0.20 - 1.20 mg/dL 0.54 0.58  Alkaline Phos 40 - 150 U/L 79 62  AST 5 - 34 U/L 28 69(H)  ALT 0 - 55 U/L 53 70(H)        RADIOGRAPHIC STUDIES: I have personally reviewed the radiological images as listed and agreed with the findings in the report.  .No results found.  ImagingResults  Dg Chest 2 View  Result Date: 11/19/2016 CLINICAL DATA:  Productive cough for the past  2 weeks. Former smoker. EXAM: CHEST  2 VIEW COMPARISON:  Chest x-ray of July 10, 2010 FINDINGS: The lungs are well-expanded. There is no focal infiltrate. There is a pectus excavatum type chest contour. The heart and pulmonary vascularity are normal. The mediastinum is normal in width. There is no pleural effusion. There is moderate dextrocurvature centered in the lower thoracic spine which is stable. IMPRESSION: Mild chronic bronchitic changes, stable. No pneumonia, pulmonary edema, nor other acute cardiopulmonary abnormality. Electronically Signed   By: David  Martinique M.D.   On: 11/19/2016 14:26   Ct Head Wo Contrast  Result Date: 11/20/2016 CLINICAL DATA:  Left arm weakness. EXAM: CT HEAD WITHOUT CONTRAST TECHNIQUE: Contiguous axial images were obtained from the base of the skull through the vertex without intravenous contrast. COMPARISON:  None. FINDINGS: Brain: Large amount of edema is identified in the high right frontal parietal region. Appearance is more suggestive of vasogenic edema than cytotoxic edema on this study. Features may be related to a potential mass lesion identified in the high right parietal region measuring 2.2 cm (image 25 series 201). Despite the edema, no subfalcine midline  shift is evident. No evidence for hydrocephalus. Vascular: No hyperdense vessel or unexpected calcification. Skull: Normal. Negative for fracture or focal lesion. Sinuses/Orbits: No acute finding. Other: None. IMPRESSION: 1. Relatively large volume of apparent vasogenic edema in the high right frontoparietal region, likely secondary to a 2.2 cm high frontal parietal lobe lesion at the gray-white junction. MRI without and with contrast would be the study of choice to further evaluate. 2. No substantial mass-effect or midline shift. I discussed these findings by telephone with the triage nurse Valarie Merino) at [place] hours on 05/24/2017. Electronically Signed   By: Misty Stanley M.D.   On: 11/20/2016 18:12   Ct Head W Contrast  Result Date: 11/21/2016 CLINICAL DATA:  76 year old female. Study for stereotactic surgical planning. 2.2 cm enhancing right superior frontal lobe mass and surrounding edema vs. nonenhancing tumor. Initial encounter. EXAM: CT HEAD WITH CONTRAST TECHNIQUE: Contiguous axial images were obtained from the base of the skull through the vertex with intravenous contrast. CONTRAST:  75 mL Isovue-300 COMPARISON:  Brain MRI without and with contrast and noncontrast head CT 11/20/2016 FINDINGS: Brain: The roughly 2 cm round enhancing mass at the right superior frontal gyrus is seen on series 3, image 138. The more faint nodular enhancement anterior to the lesion is not evident by CT. The surrounding white matter hypodensity which on MRI most resembled vasogenic edema is stable. Mild regional mass effect with no midline shift. No ventriculomegaly. No loss basilar cisterns. Elsewhere gray-white matter differentiation is within normal limits. No acute intracranial hemorrhage identified. No other abnormal intracranial enhancement. Vascular: Major intracranial vascular structures are enhancing. Skull: No osseous abnormality identified. Sinuses/Orbits: Visualized paranasal sinuses and mastoids are stable and  well pneumatized. Other: Negative orbit and scalp soft tissues. IMPRESSION: 1. Study for stereotactic surgical planning. 2. Enhancing mass in the right superior frontal gyrus is on series 3, image 138. Surrounding white matter hypodensity most suggestive of vasogenic edema is stable with mild regional mass effect but no midline shift. 3. No new intracranial abnormality. Electronically Signed   By: Genevie Ann M.D.   On: 11/21/2016 13:52   Ct Head W Wo Contrast  Result Date: 11/29/2016 CLINICAL DATA:  76 year old female status post resection of right superior frontal lobe tumor, postop day 1 Initial encounter. EXAM: CT HEAD WITHOUT AND WITH CONTRAST TECHNIQUE: Contiguous axial images were obtained from the  base of the skull through the vertex without and with intravenous contrast CONTRAST:  49m ISOVUE-300 IOPAMIDOL (ISOVUE-300) INJECTION 61% COMPARISON:  Preoperative stereotactic CT 11/21/2016 and earlier. FINDINGS: Brain: Small volume postoperative pneumocephalus. Small volume of gas and blood products in the right superior frontal gyrus resection cavity (series 2, image 23). Following contrast, no mass like enhancement is evident. Surrounding white matter hypodensity with mild mass effect tracking into the more inferior right frontal lobe is stable. There is trace leftward midline shift and mild mass effect on the right lateral ventricle now. No ventriculomegaly. No other intracranial hemorrhage identified. Basilar cisterns remain patent. No other abnormal intracranial enhancement. Vascular: Major intracranial vascular structures appear to be normally enhancing. Skull: Sequelae of right frontoparietal craniotomy. No other acute osseous abnormality. Sinuses/Orbits: Visualized paranasal sinuses and mastoids are stable and well pneumatized. Other: Mild right scalp postoperative changes from craniotomy. Skin staples in place. No other acute orbit or scalp soft tissue finding. IMPRESSION: 1. Status post right  superior frontal gyrus tumor resection with no adverse features. Stable right frontal lobe edema or nonenhancing tumor. No residual masslike enhancement is evident by post-contrast CT. 2. Mild postoperative intracranial mass effect with trace leftward midline shift. Electronically Signed   By: HGenevie AnnM.D.   On: 11/29/2016 16:44   Mr BJeri CosWTUContrast  Result Date: 11/20/2016 CLINICAL DATA:  LEFT-sided weakness for 1 day, assess for stroke. EXAM: MRI HEAD WITHOUT AND WITH CONTRAST TECHNIQUE: Multiplanar, multiecho pulse sequences of the brain and surrounding structures were obtained without and with intravenous contrast. CONTRAST:  163mMULTIHANCE GADOBENATE DIMEGLUMINE 529 MG/ML IV SOLN COMPARISON:  CT HEAD November 20, 2016 at 1752 hours FINDINGS: INTRACRANIAL CONTENTS: Ring-enhancing RIGHT frontal lobe convexity intraparenchymal 2.2 x 1.6 x 1.9 cm mass extending to the dura, central subcentimeter susceptibility artifact which could reflect mineralization or hemosiderin. Corresponding reduced diffusion and low ADC values. At least 4 additional subcentimeter satellite nodules anterior to the dominant lesion in the RIGHT frontal lobe juxta cortical white matter. Extensive surrounding T2 bright vasogenic edema and regional sulcal effacement. No midline shift. Ventricles and sulci are normal for patient's age. Patchy supratentorial white matter FLAIR T2 hyperintensities exclusive of the aforementioned abnormality compatible with mild to moderate chronic small vessel ischemic disease. No abnormal extra-axial enhancement. VASCULAR: Normal major intracranial vascular flow voids present at skull base. SKULL AND UPPER CERVICAL SPINE: No abnormal sellar expansion. No suspicious calvarial bone marrow signal. Craniocervical junction maintained. SINUSES/ORBITS: The mastoid air-cells and included paranasal sinuses are well-aerated.The included ocular globes and orbital contents are non-suspicious. OTHER: None. IMPRESSION:  Dominant 2.2 x 1.6 x 1.9 cm RIGHT frontal lobe mass with subcentimeter satellite nodules within extensive vasogenic edema. 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 No infarct.  Mild chronic small vessel ischemic disease. Acute findings discussed with and reconfirmed by Dr.ROBERT LOCKWOOD on 11/20/2016 at 8:25 pm. Electronically Signed   By: CoElon Alas.D.   On: 11/20/2016 20:28     ASSESSMENT & PLAN:   [age]o previously healthy caucasian female with no significant Chronic medical co-mrobidities with   1) Newly diagnosed Rt fronto-parietal GBM with a few satellite lesions. 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 (Neurosurgery - Dr CaChristella Noa Patient has completed concurrent chemo-radiation on 02/06/2017 and tolerated it well. Blood counts stable -MRI brain results from 02/13/2017 were discussed in details --  some increased enhancement at the site of treatment is thought to represent treatment effect presenting as pseudo-progression. PLan -no clinical trials recommended at this time by [facility] -patient has completed her 1st cycle of Adjuvant temodar with no prohibitive toxicities. -mild thrombocytopenia which will need to be monitored -we will have her rpt labs and f/u in clinic prior to next cycle of temodar to ensure labs stability. -will plan to increased dose of temodar to 270m/m2 if labs and clinical status stable. -on keppra for SZ prophylaxis.  2) left eye lower eyelid hordeolum -  resolved  3) b/l upper extremity tremors -- it appears patient had some essential tremors even prior to GBM diagnosis but now more prominent and bothersome. ?Worsened by anxiety and treatment. Now off steroids. Thyroid function tests WNL Plan -ativan prn -patient has been evaluated by neurology - Dr DNarda Amber She  was recommended f/u to consider treatment options since this is affecting her QOL significantly. -ativan prn -?Primidone.  4) Abnormal LFts - likely from temodar. Resolved  RTC with Dr KIrene Limbowith labs in 2 weeks with labs MRI brain in 5 weeks (first week of July 2018)   -All of the patients questions were answered with apparent satisfaction. The patient knows to call the clinic with any problems, questions or concerns.  I spent 20 minutes counseling the patient face to face. The total time spent in the appointment was 25 minutes and more than 50% was on counseling and direct patient cares    GSullivan LoneMD MMustangAAHIVMS SKaiser Fnd Hosp - AnaheimCDrexel Town Square Surgery CenterHematology/Oncology Physician CLake [facility] (Office):       [phone](Work cell):  [phone](Fax):           [phone]

## 2017-04-02 ENCOUNTER — Ambulatory Visit (HOSPITAL_COMMUNITY): Payer: Medicare Other | Admitting: Physical Therapy

## 2017-04-02 ENCOUNTER — Telehealth (HOSPITAL_COMMUNITY): Payer: Self-pay | Admitting: Physical Therapy

## 2017-04-02 ENCOUNTER — Ambulatory Visit (HOSPITAL_COMMUNITY): Payer: Medicare Other | Admitting: Occupational Therapy

## 2017-04-02 ENCOUNTER — Telehealth (HOSPITAL_COMMUNITY): Payer: Self-pay | Admitting: Family Medicine

## 2017-04-02 NOTE — Telephone Encounter (Signed)
Patient a no-show for today's PT session. Called and left message detailing no-show as well as OT appt at 1:45 today as well upcoming appointments next week.  Deniece Ree PT, DPT [phone]

## 2017-04-02 NOTE — Telephone Encounter (Signed)
04/02/17 daughter left a message to cx this appt but no reason was given

## 2017-04-03 ENCOUNTER — Telehealth: Payer: Self-pay | Admitting: Neurology

## 2017-04-03 MED ORDER — PRIMIDONE 50 MG PO TABS: 50.0000 mg | ORAL_TABLET | Freq: Every day | ORAL | 3 refills | Status: DC

## 2017-04-03 NOTE — Telephone Encounter (Signed)
New Prescription sent in saying 1 tablet but they will still do the 1/2. While she has been on the Chemo pill she has been taking a whole tablet due to the tremors. Now she is 6 days short. Her daughter called to say they will not fill it. Can a new prescription be sent to [facility] in [place]? Please call Daughter so she will know what to do. Thanks

## 2017-04-03 NOTE — Telephone Encounter (Signed)
Dr. Carles Collet will usually start patients at Primidone 50 mg - 1/2 tablet for four nights then increase to one tablet at bedtime. I sent in an RX for Primidone 50 mg to be able to take one tablet at bedtime. Made daughter aware [facility]) that I had done this and to call with any questions/problems.

## 2017-04-07 ENCOUNTER — Encounter (HOSPITAL_COMMUNITY): Payer: Self-pay | Admitting: Occupational Therapy

## 2017-04-07 ENCOUNTER — Ambulatory Visit (HOSPITAL_COMMUNITY): Payer: Medicare Other | Admitting: Occupational Therapy

## 2017-04-07 ENCOUNTER — Ambulatory Visit (HOSPITAL_COMMUNITY): Payer: Medicare Other | Admitting: Physical Therapy

## 2017-04-07 DIAGNOSIS — R2681 Unsteadiness on feet: Secondary | ICD-10-CM

## 2017-04-07 DIAGNOSIS — M6281 Muscle weakness (generalized): Secondary | ICD-10-CM

## 2017-04-07 DIAGNOSIS — R278 Other lack of coordination: Secondary | ICD-10-CM

## 2017-04-07 NOTE — Therapy (Signed)
[place], [place], [address] Phone: [phone]   Fax:  [phone]  Physical Therapy Treatment  Patient Details  Name: Donna Valentine MRN: [id number] Date of Birth: 03-17-1941 Referring Provider: Ashok Pall   Encounter Date: 04/07/2017      PT End of Session - 04/07/17 1428    Visit Number 19   Number of Visits 24   Date for PT Re-Evaluation 04/21/17   Authorization Type U[employer] Medicare (G-codes and KX); G-codes done 11/29/2022 session    Authorization Time Period recert completed 3/22; 0/2-5/42; recert done 7/06   Authorization - Visit Number 19   Authorization - Number of Visits 25   PT Start Time 1357  Nustep/patient in bathroom    PT Stop Time 1427   PT Time Calculation (min) 30 min   Activity Tolerance Patient tolerated treatment well   Behavior During Therapy [facility] for tasks assessed/performed      Past Medical History:  Diagnosis Date  . GBM (glioblastoma multiforme) ([place])     Past Surgical History:  Procedure Laterality Date  . APPENDECTOMY  1994  . APPLICATION OF CRANIAL NAVIGATION N/A Nov 29, 2016   Procedure: APPLICATION OF CRANIAL NAVIGATION;  Surgeon: Ashok Pall, MD;  Location: [place];  Service: Neurosurgery;  Laterality: N/A;  . CESAREAN SECTION     x3  . CRANIOTOMY Right Nov 29, 2016   Procedure: STERIOTACTIC PARIETAL CRANIOTOMY FOR RIGHT FRONTAL TUMOR RESECTION WITH BRAINLAB;  Surgeon: Ashok Pall, MD;  Location: [place];  Service: Neurosurgery;  Laterality: Right;    There were no vitals filed for this visit.      Subjective Assessment - 04/07/17 1400    Subjective Paitent states she is doing well, requests more attention on proximal musculature as she feels this helped. She is looking for lighter shoes as her current ones feel heavy.    Pertinent History glioblastoma of R frontal lobe, craniotomy done on 2016/11/29   Patient Stated Goals improve strength, enhance independence    Currently  in Pain? No/denies                         [facility] Adult PT Treatment/Exercise - 04/07/17 1401      Knee/Hip Exercises: Standing   Heel Raises Both;1 set;15 reps   Heel Raises Limitations heel raise only    Forward Step Up Both;1 set;10 reps   Forward Step Up Limitations 6 inch box   8#    Other Standing Knee Exercises farmers carry with 8# 41f x2      Knee/Hip Exercises: Seated   Sit to Sand 15 reps;without UE support;1 set  8#      Knee/Hip Exercises: Supine   Bridges Limitations 2x20   3 second holds    Straight Leg Raises Both;1 set;15 reps   Straight Leg Raises Limitations eccentric lower/core squeeze                 PT Education - 04/07/17 1427    Education provided Yes   Education Details minimalist shoe wear- examples Nike FlyKnit and Vibram 5 finger shoes; encouraged her to please bring daughter for education/guidance on exercise    Person(s) Educated Patient   Methods Explanation   Comprehension Verbalized understanding          PT Short Term Goals - 03/24/17 1140      PT SHORT TERM GOAL #1   Title Patient to be able to identify 5/5 safety factors/concepts  in order to demonstrate good safety and reduced fall risk    Baseline 6/12- 2/5   Time 2   Period Weeks   Status Partially Met     PT SHORT TERM GOAL #2   Title Patient to be able to ambulate over 762f in 3MWT in order to demonstrate improved general mobility and community access    Baseline 6/12- 626   Time 2   Period Weeks   Status On-going     PT SHORT TERM GOAL #3   Title Patient to be independent in correctly and consistently performing targeted HEP, to be updated as appropriate    Baseline 6/12- walks, somewhat compliant with other exercises   Period Weeks   Status Partially Met     PT SHORT TERM GOAL #4   Title Patient to be performing regular progressive walking program, at least 10-15 minutes in duration and 1-2 times per day, in order to assist in addressing  fatigue and functional weakness    Baseline 6/12- has not walked this much per day, but does walk daily    Time 3   Period Weeks   Status On-going           PT Long Term Goals - 03/24/17 1143      PT LONG TERM GOAL #1   Title Patient to demonstrate functional strength 5/5 in all tested muscle groups in order to improve overall gait and balance    Baseline 6/12- proximal weakness    Time 4   Period Weeks   Status Partially Met     PT LONG TERM GOAL #2   Title Patient to score 24 on DGI in order to show improved dynamic balance skills and overall reduced fall risk    Baseline 6/12- 23/24    Time 4   Period Weeks   Status Partially Met     PT LONG TERM GOAL #3   Title Patient to be participatory in regular aerobic exercise program, at least 20 minutes in duration and 4 days per week, in order to maintain functional gains and imrpove overall health status    Baseline 6/12- discussed today    Time 4   Period Weeks   Status On-going               Plan - 04/07/17 1428    Clinical Impression Statement Patient arrives in good spirits and requesting ongoing focus on hip musculature as she felt strong focus on this helped last session. Continued Nustep for functional activity tolerance (not included in billing), and continued with proximal strength per patient request. Education provided regarding shoe/foot wear options that are light but appropriate for exercise, IE Nike FlyKnits and similar build shoes.    Rehab Potential Good   Clinical Impairments Affecting Rehab Potential (+) high PLOF, very active at baseline, good recovery after craniotomy; (-) may be starting radiation/chemo soon/effects of these treatments on tolerance to rehab    PT Frequency 2x / week   PT Duration 4 weeks   PT Treatment/Interventions ADLs/Self Care Home Management;Biofeedback;Gait training;Stair training;Functional mobility training;Therapeutic activities;Therapeutic exercise;Balance  training;Neuromuscular re-education;Patient/family education;Manual techniques;Energy conservation;Taping   PT Next Visit Plan continue [facility] weighted exercise, daughter education. Continue to discuss independent program    Consulted and Agree with Plan of Care Patient      Patient will benefit from skilled therapeutic intervention in order to improve the following deficits and impairments:  Abnormal gait, Decreased coordination, Decreased mobility, Decreased activity tolerance, Decreased strength, Decreased balance,  Difficulty walking  Visit Diagnosis: Other lack of coordination  Muscle weakness (generalized)  Unsteadiness on feet     Problem List Patient Active Problem List   Diagnosis Date Noted  . Oral candida 01/08/2017  . Goals of care, counseling/discussion   . Glioblastoma multiforme of frontal lobe ([place]) 12/01/2016  . Brain tumor ([place]) 11/28/2016  . 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 Left-sided weakness 11/20/2016  . 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 HYPERCHOLESTEROLEMIA, BORDERLINE 07/28/2008  . ANEMIA, MILD 07/28/2008  . DIVERTICULOSIS OF COLON 07/28/2008  . HEMATURIA, HX OF 07/28/2008  . COLONIC POLYPS 07/27/2008  . RIGHT BUNDLE BRANCH BLOCK 07/27/2008  . PREMATURE ATRIAL CONTRACTIONS 07/27/2008  . HEMORRHOIDS 07/27/2008  . VENOUS INSUFFICIENCY 07/27/2008  . BACK PAIN, LUMBAR 07/27/2008  . SCOLIOSIS 07/27/2008  . PALPITATIONS, HX OF 07/27/2008    Deniece Ree PT, DPT [facility] [address] [place], [place], [address] Phone: [phone]   Fax:  [phone]  Name: Marian Meneely MRN: [id number] Date of Birth: January 26, 1941

## 2017-04-07 NOTE — Therapy (Signed)
[place], [place], [address] Phone: [phone]   Fax:  [phone]  Occupational Therapy Treatment  Patient Details  Name: Donna Valentine MRN: [id number] Date of Birth: 04/16/1941 Referring Provider: Dr. Ashok Pall  Encounter Date: 04/07/2017      OT End of Session - 04/07/17 1410    Visit Number 12   Number of Visits 18   Date for OT Re-Evaluation 04/25/17   Authorization Type UHC Medicare   Authorization Time Period before 18th visit   Authorization - Visit Number 12   Authorization - Number of Visits 18   OT Start Time 1301   OT Stop Time 1342   OT Time Calculation (min) 41 min   Activity Tolerance Patient tolerated treatment well   Behavior During Therapy [facility] for tasks assessed/performed      Past Medical History:  Diagnosis Date  . GBM (glioblastoma multiforme) ([place])     Past Surgical History:  Procedure Laterality Date  . APPENDECTOMY  1994  . APPLICATION OF CRANIAL NAVIGATION N/A 11/28/2016   Procedure: APPLICATION OF CRANIAL NAVIGATION;  Surgeon: Ashok Pall, MD;  Location: [place];  Service: Neurosurgery;  Laterality: N/A;  . CESAREAN SECTION     x3  . CRANIOTOMY Right 11/28/2016   Procedure: STERIOTACTIC PARIETAL CRANIOTOMY FOR RIGHT FRONTAL TUMOR RESECTION WITH BRAINLAB;  Surgeon: Ashok Pall, MD;  Location: [place];  Service: Neurosurgery;  Laterality: Right;    There were no vitals filed for this visit.      Subjective Assessment - 04/07/17 1300    Subjective  S: I'm feeling pretty good today.    Currently in Pain? No/denies            [facility] OT Assessment - 04/07/17 1300      Assessment   Diagnosis LUE Weakness S/P removal of right frontal lobe glioblastoma multiforme     Precautions   Precautions Other (comment)   Precaution Comments brain tumor, hx craniotomy, on active radiation                   OT Treatments/Exercises (OP) - 04/07/17 1313      Exercises   Exercises Shoulder;Hand     Shoulder Exercises: Standing   Protraction Theraband;10 reps   Theraband Level (Shoulder Protraction) Level 2 (Red)   Horizontal ABduction Theraband;10 reps   Theraband Level (Shoulder Horizontal ABduction) Level 2 (Red)   External Rotation Theraband;10 reps   Theraband Level (Shoulder External Rotation) Level 2 (Red)   Internal Rotation Theraband;10 reps   Theraband Level (Shoulder Internal Rotation) Level 2 (Red)   Flexion Theraband;10 reps   Theraband Level (Shoulder Flexion) Level 2 (Red)   ABduction Theraband;10 reps   Theraband Level (Shoulder ABduction) Level 2 (Red)   Extension Theraband;10 reps   Theraband Level (Shoulder Extension) Level 2 (Red)   Row Theraband;10 reps   Theraband Level (Shoulder Row) Level 2 (Red)     Hand Exercises   Hand Gripper with Large Beads all beads gripper at 25#, 3/4# wrist weight   Hand Gripper with Medium Beads all beads gripper at 25#, 3/4# wrist weight   Large Pegboard Pt completed large pegboard activity using left hand with 3/4# wrist weight following a pattern.   Small Pegboard Attempted small pegboard using 3/4# wrist weight, pt able to place 2 pegs, however tremors became too intense to continue  OT Short Term Goals - 03/26/17 1133      OT SHORT TERM GOAL #1   Title Patient will be educated and independent with an HEP for improved functional use of LUE with all daily tasks.    Time 6   Period Weeks   Status On-going     OT SHORT TERM GOAL #2   Title Patient will return to prior level of function, using her left upper extremity actively with all tasks.    Time 6   Period Weeks   Status On-going     OT SHORT TERM GOAL #3   Title Patient will improve left shoulder A/ROM to WNL for improved ability to reach into overhead cabinets.    Time 6   Period Weeks   Status On-going     OT SHORT TERM GOAL #4   Title Patient will improve left upper extremity strength to  4+/5 for improved ability to lift shopping bags.    Time 6   Period Weeks   Status Partially Met     OT SHORT TERM GOAL #5   Title Patient will improve left grip strength by 10 pounds and pinch strength by 6 pounds or more for improved ability to open containers and maintain grip on objects.     Time 6   Period Weeks   Status On-going     OT SHORT TERM GOAL #6   Title Patient will improve fine motor coordination needed to fasten jewelery by decreasing completion time on nine hole peg test by 10 seconds.    Time 6   Period Weeks   Status On-going     OT SHORT TERM GOAL #7   Title Patient will decrease non purposeful tremors from moderate to minimal for improved abilty to complete daily tasks safely.    Baseline 5/18: Goal deferred at this time due to unknown source of tremors.   Time 6   Period Weeks   Status Deferred                  Plan - 04/07/17 1410    Clinical Impression Statement A: Continued with shoulder strengthening, pegboard fine motor task, and grip strengthening this session. Pt utilized 3/4# wrist weight on LUE for improved tremor control. Did not provide red theraband strengthening HEP due to pt requiring consistent verbal cuing this session for form and technique.    Plan P: continue with shoulder strengthening with theraband, providing HEP when appropriate. Increase gripper resistance for grip strengthening   Consulted and Agree with Plan of Care Patient      Patient will benefit from skilled therapeutic intervention in order to improve the following deficits and impairments:  Decreased coordination, Decreased range of motion, Decreased strength, Impaired UE functional use  Visit Diagnosis: Other lack of coordination  Muscle weakness (generalized)    Problem List Patient Active Problem List   Diagnosis Date Noted  . Oral candida 01/08/2017  . Goals of care, counseling/discussion   . Glioblastoma multiforme of frontal lobe ([place]) 12/01/2016  .  Brain tumor ([place]) 11/28/2016  . 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 Left-sided weakness 11/20/2016  . 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 HYPERCHOLESTEROLEMIA, BORDERLINE 07/28/2008  . ANEMIA, MILD 07/28/2008  . DIVERTICULOSIS OF COLON 07/28/2008  . HEMATURIA, HX OF 07/28/2008  . COLONIC POLYPS 07/27/2008  . RIGHT BUNDLE BRANCH BLOCK 07/27/2008  . PREMATURE ATRIAL CONTRACTIONS 07/27/2008  . HEMORRHOIDS 07/27/2008  . VENOUS INSUFFICIENCY 07/27/2008  .  BACK PAIN, LUMBAR 07/27/2008  . SCOLIOSIS 07/27/2008  . PALPITATIONS, HX OF 07/27/2008    Guadelupe Sabin, OTR/L  [phone] 04/07/2017, 2:13 PM  [facility] [address] [place], [place], [address] Phone: [phone]   Fax:  [phone]  Name: Donna Valentine MRN: [id number] Date of Birth: 1941/07/06

## 2017-04-09 ENCOUNTER — Ambulatory Visit (HOSPITAL_COMMUNITY): Payer: Medicare Other | Admitting: Physical Therapy

## 2017-04-09 ENCOUNTER — Ambulatory Visit (HOSPITAL_COMMUNITY): Payer: Medicare Other | Admitting: Occupational Therapy

## 2017-04-09 ENCOUNTER — Encounter (HOSPITAL_COMMUNITY): Payer: Self-pay | Admitting: Occupational Therapy

## 2017-04-09 DIAGNOSIS — R278 Other lack of coordination: Secondary | ICD-10-CM

## 2017-04-09 DIAGNOSIS — M6281 Muscle weakness (generalized): Secondary | ICD-10-CM

## 2017-04-09 DIAGNOSIS — R2681 Unsteadiness on feet: Secondary | ICD-10-CM

## 2017-04-09 NOTE — Therapy (Signed)
[place], [place], [address] Phone: [phone]   Fax:  [phone]  Occupational Therapy Treatment  Patient Details  Name: Donna Valentine MRN: [id number] Date of Birth: [birth year] Referring Provider: Dr. Ashok Pall  Encounter Date: 04/09/2017      OT End of Session - 04/09/17 1629    Visit Number 13   Number of Visits 18   Date for OT Re-Evaluation 04/25/17   Authorization Type UHC Medicare   Authorization Time Period before 18th visit   Authorization - Visit Number 13   Authorization - Number of Visits 18   OT Start Time 1304   OT Stop Time 1344   OT Time Calculation (min) 40 min   Activity Tolerance Patient tolerated treatment well   Behavior During Therapy [facility] for tasks assessed/performed      Past Medical History:  Diagnosis Date  . GBM (glioblastoma multiforme) ([place])     Past Surgical History:  Procedure Laterality Date  . APPENDECTOMY  1994  . APPLICATION OF CRANIAL NAVIGATION N/A 11/28/2016   Procedure: APPLICATION OF CRANIAL NAVIGATION;  Surgeon: Ashok Pall, MD;  Location: [place];  Service: Neurosurgery;  Laterality: N/A;  . CESAREAN SECTION     x3  . CRANIOTOMY Right 11/28/2016   Procedure: STERIOTACTIC PARIETAL CRANIOTOMY FOR RIGHT FRONTAL TUMOR RESECTION WITH BRAINLAB;  Surgeon: Ashok Pall, MD;  Location: [place];  Service: Neurosurgery;  Laterality: Right;    There were no vitals filed for this visit.      Subjective Assessment - 04/09/17 1306    Subjective  S: This hand is bad today.    Currently in Pain? No/denies            [facility] OT Assessment - 04/09/17 1305      Assessment   Diagnosis LUE Weakness S/P removal of right frontal lobe glioblastoma multiforme     Precautions   Precautions Other (comment)   Precaution Comments brain tumor, hx craniotomy, on active radiation                   OT Treatments/Exercises (OP) - 04/09/17 1305      Exercises    Exercises Shoulder;Hand     Shoulder Exercises: Standing   Protraction Theraband;12 reps   Theraband Level (Shoulder Protraction) Level 2 (Red)   Horizontal ABduction Theraband;10 reps   Theraband Level (Shoulder Horizontal ABduction) Level 2 (Red)   External Rotation Theraband;10 reps   Theraband Level (Shoulder External Rotation) Level 2 (Red)   Internal Rotation Theraband;10 reps   Theraband Level (Shoulder Internal Rotation) Level 2 (Red)   Flexion Theraband;12 reps   Theraband Level (Shoulder Flexion) Level 2 (Red)   ABduction Theraband;10 reps   Theraband Level (Shoulder ABduction) Level 2 (Red)     Hand Exercises   Theraputty Flatten;Roll;Grip;Pinch;Locate Pegs   Theraputty - Flatten red    Theraputty - Roll red    Theraputty - Grip red supinated and pronated grasp   Theraputty - Pinch red - 3 point   Hand Gripper with Large Beads all beads gripper at 25#, 3/4# wrist weight   Hand Gripper with Medium Beads unable to complete this session due to increased tremors   Other Hand Exercises Pt used pvc pipe to cut circles in red theraputty with left hand. Mod difficulty cutting to table due to tremors                  OT Short Term Goals -  03/26/17 1133      OT SHORT TERM GOAL #1   Title Patient will be educated and independent with an HEP for improved functional use of LUE with all daily tasks.    Time 6   Period Weeks   Status On-going     OT SHORT TERM GOAL #2   Title Patient will return to prior level of function, using her left upper extremity actively with all tasks.    Time 6   Period Weeks   Status On-going     OT SHORT TERM GOAL #3   Title Patient will improve left shoulder A/ROM to WNL for improved ability to reach into overhead cabinets.    Time 6   Period Weeks   Status On-going     OT SHORT TERM GOAL #4   Title Patient will improve left upper extremity strength to 4+/5 for improved ability to lift shopping bags.    Time 6   Period Weeks    Status Partially Met     OT SHORT TERM GOAL #5   Title Patient will improve left grip strength by 10 pounds and pinch strength by 6 pounds or more for improved ability to open containers and maintain grip on objects.     Time 6   Period Weeks   Status On-going     OT SHORT TERM GOAL #6   Title Patient will improve fine motor coordination needed to fasten jewelery by decreasing completion time on nine hole peg test by 10 seconds.    Time 6   Period Weeks   Status On-going     OT SHORT TERM GOAL #7   Title Patient will decrease non purposeful tremors from moderate to minimal for improved abilty to complete daily tasks safely.    Baseline 5/18: Goal deferred at this time due to unknown source of tremors.   Time 6   Period Weeks   Status Deferred                  Plan - 04/09/17 1629    Clinical Impression Statement A: Continued with theraband strengthening this session, 1 rest break for fatigue. Also continued with grip strengthening. Pt when shopping for several hours yesterday, and presents with increased tremors today in BUE, limiting pinch and coordination task completion.    Plan P: continue with theraband shoulder strengthening, provide HEP. Resume coordination tasks if pt able to complete   Consulted and Agree with Plan of Care Patient      Patient will benefit from skilled therapeutic intervention in order to improve the following deficits and impairments:  Decreased coordination, Decreased range of motion, Decreased strength, Impaired UE functional use  Visit Diagnosis: Other lack of coordination  Muscle weakness (generalized)    Problem List Patient Active Problem List   Diagnosis Date Noted  . Oral candida 01/08/2017  . Goals of care, counseling/discussion   . Glioblastoma multiforme of frontal lobe ([facility]) 12/01/2016  . Brain tumor ([facility]) 11/28/2016  . 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 Left-sided weakness  11/20/2016  . 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 HYPERCHOLESTEROLEMIA, BORDERLINE 07/28/2008  . ANEMIA, MILD 07/28/2008  . DIVERTICULOSIS OF COLON 07/28/2008  . HEMATURIA, HX OF 07/28/2008  . COLONIC POLYPS 07/27/2008  . RIGHT BUNDLE BRANCH BLOCK 07/27/2008  . PREMATURE ATRIAL CONTRACTIONS 07/27/2008  . HEMORRHOIDS 07/27/2008  . VENOUS INSUFFICIENCY 07/27/2008  . BACK PAIN, LUMBAR 07/27/2008  . SCOLIOSIS 07/27/2008  . PALPITATIONS,  HX OF 07/27/2008   Guadelupe Sabin, OTR/L  [phone] 04/09/2017, 4:33 PM  [facility] [address] [place], [place], [address] Phone: [phone]   Fax:  [phone]  Name: Donna Valentine MRN: [id number] Date of Birth: 10/12/1941

## 2017-04-09 NOTE — Patient Instructions (Signed)
   BRIDGING  While lying on your back, tighten your lower abdominals, squeeze your buttocks, and bring your left leg close to your rear/put your right leg further away. Then raise your buttocks off the floor/bed as creating a "Bridge" with your body.  Repeat 15-20 times, then reverse your legs.   Repeat  twice a day.     ELASTIC BAND - SIDELYING CLAM-   While lying on your side with your knees bent and an elastic band wrapped around your knees, draw up the top knee while keeping contact of your feet together as shown.   Do not let your pelvis roll back during the lifting movement.   Repeat 10-15 times each side, twice a day.

## 2017-04-09 NOTE — Therapy (Signed)
[place], [place], [address] Phone: [phone]   Fax:  [phone]  Physical Therapy Treatment  Patient Details  Name: Donna Valentine MRN: [id number] Date of Birth: [birth year] Referring Provider: Ashok Pall   Encounter Date: 04/09/2017      PT End of Session - 04/09/17 1432    Visit Number 20   Number of Visits 24   Date for PT Re-Evaluation 04/21/17   Authorization Type [employer] Medicare (G-codes and KX); G-codes done 12-Dec-2022 session    Authorization Time Period recert completed 4/91; 7/9-1/50; recert done 5/69   Authorization - Visit Number 20   Authorization - Number of Visits 25   PT Start Time 1402  Nustep and patient in restroom    PT Stop Time 1429   PT Time Calculation (min) 27 min   Activity Tolerance Patient tolerated treatment well   Behavior During Therapy [facility] for tasks assessed/performed      Past Medical History:  Diagnosis Date  . GBM (glioblastoma multiforme) ([place])     Past Surgical History:  Procedure Laterality Date  . APPENDECTOMY  1994  . APPLICATION OF CRANIAL NAVIGATION N/A 2016/12/12   Procedure: APPLICATION OF CRANIAL NAVIGATION;  Surgeon: Ashok Pall, MD;  Location: [place];  Service: Neurosurgery;  Laterality: N/A;  . CESAREAN SECTION     x3  . CRANIOTOMY Right 12-Dec-2016   Procedure: STERIOTACTIC PARIETAL CRANIOTOMY FOR RIGHT FRONTAL TUMOR RESECTION WITH BRAINLAB;  Surgeon: Ashok Pall, MD;  Location: [place];  Service: Neurosurgery;  Laterality: Right;    There were no vitals filed for this visit.      Subjective Assessment - 04/09/17 1352    Subjective Patient is doing well but noticed some swelling in her R ankle this morning, she did not trip or injure her ankle and is not sure where this is from, it is not painful. Her daughter was unable to come today but they will try again soon.    Pertinent History glioblastoma of R frontal lobe, craniotomy done on Dec 12, 2016   Patient  Stated Goals improve strength, enhance independence    Currently in Pain? No/denies                         [facility] Adult PT Treatment/Exercise - 04/09/17 1413      Therapeutic Activites    Therapeutic Activities Other Therapeutic Activities   Other Therapeutic Activities technique for supine to sit/sit to supine with increased ease      Knee/Hip Exercises: Aerobic   Nustep Nustep level 3 x8 minutesat least 80SPM   not included in billing      Knee/Hip Exercises: Supine   Bridges Limitations 2x20   staggered stance      Knee/Hip Exercises: Sidelying   Clams 1x10 red TB      Manual Therapy   Manual Therapy Edema management   Manual therapy comments separate from rest of session    Edema Management L LE elevated, retrograde masssage                 PT Education - 04/09/17 1431    Education provided Yes   Education Details bedmobility technique, continued to encourage to bring daughter; contact MD if edema gets worse    Person(s) Educated Patient   Methods Explanation   Comprehension Verbalized understanding          PT Short Term Goals - 03/24/17 1140  PT SHORT TERM GOAL #1   Title Patient to be able to identify 5/5 safety factors/concepts in order to demonstrate good safety and reduced fall risk    Baseline 6/12- 2/5   Time 2   Period Weeks   Status Partially Met     PT SHORT TERM GOAL #2   Title Patient to be able to ambulate over 744f in 3MWT in order to demonstrate improved general mobility and community access    Baseline 6/12- 626   Time 2   Period Weeks   Status On-going     PT SHORT TERM GOAL #3   Title Patient to be independent in correctly and consistently performing targeted HEP, to be updated as appropriate    Baseline 6/12- walks, somewhat compliant with other exercises   Period Weeks   Status Partially Met     PT SHORT TERM GOAL #4   Title Patient to be performing regular progressive walking program, at least 10-15  minutes in duration and 1-2 times per day, in order to assist in addressing fatigue and functional weakness    Baseline 6/12- has not walked this much per day, but does walk daily    Time 3   Period Weeks   Status On-going           PT Long Term Goals - 03/24/17 1143      PT LONG TERM GOAL #1   Title Patient to demonstrate functional strength 5/5 in all tested muscle groups in order to improve overall gait and balance    Baseline 6/12- proximal weakness    Time 4   Period Weeks   Status Partially Met     PT LONG TERM GOAL #2   Title Patient to score 24 on DGI in order to show improved dynamic balance skills and overall reduced fall risk    Baseline 6/12- 23/24    Time 4   Period Weeks   Status Partially Met     PT LONG TERM GOAL #3   Title Patient to be participatory in regular aerobic exercise program, at least 20 minutes in duration and 4 days per week, in order to maintain functional gains and imrpove overall health status    Baseline 6/12- discussed today    Time 4   Period Weeks   Status On-going               Plan - 04/09/17 1432    Clinical Impression Statement Began session on Nustep with progressed settings, not included in billing. Performed retrograde massage to L ankle today due to patient complaints of ankle edema. Also worked on functional bed mobility, specifically sit to supine/supine to sit with no assist, as patient has been having trouble with this recently. Otherwise continued functional exercises as time allowed this session, continued to encourage patient to bring in her daughter for education as well.    Rehab Potential Good   Clinical Impairments Affecting Rehab Potential (+) high PLOF, very active at baseline, good recovery after craniotomy; (-) may be starting radiation/chemo soon/effects of these treatments on tolerance to rehab    PT Frequency 2x / week   PT Duration 4 weeks   PT Treatment/Interventions ADLs/Self Care Home  Management;Biofeedback;Gait training;Stair training;Functional mobility training;Therapeutic activities;Therapeutic exercise;Balance training;Neuromuscular re-education;Patient/family education;Manual techniques;Energy conservation;Taping   PT Next Visit Plan continue CKC weighted exercise, daughter education. Continue to discuss independent program. MOnitior ankle edema. Consider DC soon.    PT Home Exercise Plan Eval: sit to stand no  UEs, seated clams with red and green TB, SLS, tandem stance 5/16: progressive walking program; 5/18: Given STAR booklet for education for fatgiue and documentation of walking program 6/14: bridges wth hold, hip ABD sidelying    Consulted and Agree with Plan of Care Patient      Patient will benefit from skilled therapeutic intervention in order to improve the following deficits and impairments:  Abnormal gait, Decreased coordination, Decreased mobility, Decreased activity tolerance, Decreased strength, Decreased balance, Difficulty walking  Visit Diagnosis: Other lack of coordination  Muscle weakness (generalized)  Unsteadiness on feet     Problem List Patient Active Problem List   Diagnosis Date Noted  . Oral candida 01/08/2017  . Goals of care, counseling/discussion   . Glioblastoma multiforme of frontal lobe ([place]) 12/01/2016  . Brain tumor ([place]) 11/28/2016  . 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 Left-sided weakness 11/20/2016  . 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 HYPERCHOLESTEROLEMIA, BORDERLINE 07/28/2008  . ANEMIA, MILD 07/28/2008  . DIVERTICULOSIS OF COLON 07/28/2008  . HEMATURIA, HX OF 07/28/2008  . COLONIC POLYPS 07/27/2008  . RIGHT BUNDLE BRANCH BLOCK 07/27/2008  . PREMATURE ATRIAL CONTRACTIONS 07/27/2008  . HEMORRHOIDS 07/27/2008  . VENOUS INSUFFICIENCY 07/27/2008  . BACK PAIN, LUMBAR 07/27/2008  . SCOLIOSIS 07/27/2008  . PALPITATIONS, HX OF 07/27/2008    Deniece Ree PT,  DPT [place] [address] [place], [place], [address] Phone: [phone]   Fax:  [phone]  Name: Donna Valentine MRN: [id number] Date of Birth: 21-Apr-1941

## 2017-04-14 ENCOUNTER — Ambulatory Visit (HOSPITAL_COMMUNITY): Payer: Medicare Other | Attending: Neurosurgery

## 2017-04-14 ENCOUNTER — Encounter (HOSPITAL_COMMUNITY): Payer: Self-pay

## 2017-04-14 ENCOUNTER — Ambulatory Visit (HOSPITAL_COMMUNITY): Payer: Medicare Other | Admitting: Physical Therapy

## 2017-04-14 DIAGNOSIS — M6281 Muscle weakness (generalized): Secondary | ICD-10-CM | POA: Diagnosis present

## 2017-04-14 DIAGNOSIS — R2681 Unsteadiness on feet: Secondary | ICD-10-CM

## 2017-04-14 DIAGNOSIS — R278 Other lack of coordination: Secondary | ICD-10-CM | POA: Diagnosis not present

## 2017-04-14 NOTE — Therapy (Signed)
[place], [place], [address] Phone: [phone]   Fax:  [phone]  Physical Therapy Treatment  Patient Details  Name: Donna Valentine MRN: [id number] Date of Birth: 1941/10/05 Referring Provider: Ashok Pall   Encounter Date: 04/14/2017      PT End of Session - 04/14/17 1556    Visit Number 21   Number of Visits 24   Date for PT Re-Evaluation 04/21/17   Authorization Type [employer] Medicare (G-codes and KX); G-codes done 01/16/2023 session    Authorization Time Period recert completed 1/06; 2/6-9/48; recert done 5/46   Authorization - Visit Number 21   Authorization - Number of Visits 25   PT Start Time 2703   PT Stop Time 1430   PT Time Calculation (min) 45 min   Activity Tolerance Patient tolerated treatment well;No increased pain   Behavior During Therapy [facility] for tasks assessed/performed      Past Medical History:  Diagnosis Date  . GBM (glioblastoma multiforme) ([place])     Past Surgical History:  Procedure Laterality Date  . APPENDECTOMY  1994  . APPLICATION OF CRANIAL NAVIGATION N/A 11/28/2016   Procedure: APPLICATION OF CRANIAL NAVIGATION;  Surgeon: Ashok Pall, MD;  Location: [facility];  Service: Neurosurgery;  Laterality: N/A;  . CESAREAN SECTION     x3  . CRANIOTOMY Right 11/28/2016   Procedure: STERIOTACTIC PARIETAL CRANIOTOMY FOR RIGHT FRONTAL TUMOR RESECTION WITH BRAINLAB;  Surgeon: Ashok Pall, MD;  Location: [facility];  Service: Neurosurgery;  Laterality: Right;    There were no vitals filed for this visit.      Subjective Assessment - 04/14/17 1553    Subjective Pt and her daughter report things are going well. Pt states that she is unable to complete her exercises at home sometimes when she is having rough days.    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 No/denies              [facility] Adult PT Treatment/Exercise - 04/14/17 1416      Knee/Hip Exercises: Aerobic   Nustep Nustep L1, x5 min above 80 RPM, increased to L3 x5 min, above 60 RPM     Knee/Hip Exercises: Seated   Long Arc Quad Both;1 set;10 reps   Long Arc Quad Limitations red TB   Ball Squeeze 10x3 sec, HEP demo    Clamshell with TheraBand Red  x10 reps, HEP demo    Other Seated Knee/Hip Exercises heel/toe raises x15 reps, HEP demo   Hamstring Curl Both;1 set;5 reps   Hamstring Limitations red TB, HEP demo             PT Education - 04/14/17 1554    Education provided Yes   Education Details importance of adhering to HEP and implementing daily/weekly walking program to avoid any chance of decline in strength and mobility; implemented and reviewed HEP for pt to perform on days that she is most impacted by her treatments.    Person(s) Educated Patient;Child(ren)   Methods Explanation;Verbal cues;Handout   Comprehension Verbalized understanding;Returned demonstration          PT Short Term Goals - 03/24/17 1140      PT SHORT TERM GOAL #1   Title Patient to be able to identify 5/5 safety factors/concepts in order to demonstrate good safety and reduced fall risk    Baseline 6/12- 2/5  Time 2   Period Weeks   Status Partially Met     PT SHORT TERM GOAL #2   Title Patient to be able to ambulate over 755f in 3MWT in order to demonstrate improved general mobility and community access    Baseline 6/12- 626   Time 2   Period Weeks   Status On-going     PT SHORT TERM GOAL #3   Title Patient to be independent in correctly and consistently performing targeted HEP, to be updated as appropriate    Baseline 6/12- walks, somewhat compliant with other exercises   Period Weeks   Status Partially Met     PT SHORT TERM GOAL #4   Title Patient to be performing regular progressive walking program, at least 10-15 minutes in duration and 1-2 times per day, in order to assist in  addressing fatigue and functional weakness    Baseline 6/12- has not walked this much per day, but does walk daily    Time 3   Period Weeks   Status On-going           PT Long Term Goals - 03/24/17 1143      PT LONG TERM GOAL #1   Title Patient to demonstrate functional strength 5/5 in all tested muscle groups in order to improve overall gait and balance    Baseline 6/12- proximal weakness    Time 4   Period Weeks   Status Partially Met     PT LONG TERM GOAL #2   Title Patient to score 24 on DGI in order to show improved dynamic balance skills and overall reduced fall risk    Baseline 6/12- 23/24    Time 4   Period Weeks   Status Partially Met     PT LONG TERM GOAL #3   Title Patient to be participatory in regular aerobic exercise program, at least 20 minutes in duration and 4 days per week, in order to maintain functional gains and imrpove overall health status    Baseline 6/12- discussed today    Time 4   Period Weeks   Status On-going               Plan - 04/14/17 1556    Clinical Impression Statement Today's session focused heavily on educating pt on the importance of increased HEP adherence and daily walking to decrease her risk of declining function/mobility/strength in the future. Therapist reviewed a HEP for the pt to complete on days where she is most fatigued secondary to her chemo treatments. Both the pt and her daughter verbalized understanding of new exercises. Spoke with evaluating therapist who will plan to updated pt's advanced HEP at next session.   Rehab Potential Good   Clinical Impairments Affecting Rehab Potential (+) high PLOF, very active at baseline, good recovery after craniotomy; (-) may be starting radiation/chemo soon/effects of these treatments on tolerance to rehab    PT Frequency 2x / week   PT Duration 4 weeks   PT Treatment/Interventions ADLs/Self Care Home Management;Biofeedback;Gait training;Stair training;Functional mobility  training;Therapeutic activities;Therapeutic exercise;Balance training;Neuromuscular re-education;Patient/family education;Manual techniques;Energy conservation;Taping   PT Next Visit Plan provide HEP for general use ("good days"); continue CKC weighted exercise, Monitior ankle edema. Consider DC soon.    PT Home Exercise Plan off days: Seated hip adduction squeezes x10, seated clamshells x10 with red TB, seated knee extension/flexion with red TB x10, seated or standing heel/toe raises x20   Consulted and Agree with Plan of Care Patient  Patient will benefit from skilled therapeutic intervention in order to improve the following deficits and impairments:  Abnormal gait, Decreased coordination, Decreased mobility, Decreased activity tolerance, Decreased strength, Decreased balance, Difficulty walking  Visit Diagnosis: Other lack of coordination  Muscle weakness (generalized)  Unsteadiness on feet     Problem List Patient Active Problem List   Diagnosis Date Noted  . Oral candida 01/08/2017  . Goals of care, counseling/discussion   . Glioblastoma multiforme of frontal lobe ([place]) 12/01/2016  . Brain tumor ([place]) 11/28/2016  . 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 Left-sided weakness 11/20/2016  . 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 HYPERCHOLESTEROLEMIA, BORDERLINE 07/28/2008  . ANEMIA, MILD 07/28/2008  . DIVERTICULOSIS OF COLON 07/28/2008  . HEMATURIA, HX OF 07/28/2008  . COLONIC POLYPS 07/27/2008  . RIGHT BUNDLE BRANCH BLOCK 07/27/2008  . PREMATURE ATRIAL CONTRACTIONS 07/27/2008  . HEMORRHOIDS 07/27/2008  . VENOUS INSUFFICIENCY 07/27/2008  . BACK PAIN, LUMBAR 07/27/2008  . SCOLIOSIS 07/27/2008  . PALPITATIONS, HX OF 07/27/2008    5:51 PM,04/14/17 Elly Modena PT, DPT [facility] [place] [address] [place], [place],  [address] Phone: [phone]   Fax:  [phone]  Name: Donna Valentine MRN: [id number] Date of Birth: [birth year]

## 2017-04-14 NOTE — Patient Instructions (Signed)
Retraction    Facing anchor, hold hands and elbow at shoulder height, with elbow bent.  Pull arms back to squeeze shoulder blades together. Repeat 10-15 times.    Row   Facing anchor, arms reaching forward, pull hands toward stomach, keeping elbows bent and at your sides and pinching shoulder blades together. Repeat 10-15 times.    Extension   Face anchor, pull arms back, keeping elbow straight, and squeze shoulder blades together. Repeat 10-15 times.    SHOULDER FLEXION WITH THERABAND  While standing with back to the door, holding Theraband at hand level, raise arm in front of you.  Keep elbow straight through entire movement.        Protraction  Punch out 10 times, slow and controlled      Horizontal Abduction   Start by holding the theraband out in front of the body with the elbow straight. The hand and elbow should be directly aligned with the top of the shoulder. Then, pull the therband out and allow the arm to move to the side of the body while still maintaining a straight elbow. Squeeze the shoulder blade and hold for 2 seconds, then return to the starting position.          Shoulder Abduction - Theraband  Place one end of theraband under foot and one in hand.  Keeping elbow straight, raise the arm out to the side.        Internal Rotation  Using a theraband that is anchored, bend your elbow and place a towel roll between your elbow and ribcage.  Then rotate your arm towards your stomach.   Make sure your shoulder blades are squeezed back.        Shoulder External Rotation  Standing with your arm at 90 degrees by your side, using a towel to keep your arm at your side.  Grasp a theraband, pull your shoulder blades back, then proceed to rotate just the forearm out to the side and control the movement back to start.  Repeat.

## 2017-04-14 NOTE — Therapy (Signed)
[place], [place], [address] Phone: [phone]   Fax:  [phone]  Occupational Therapy Treatment  Patient Details  Name: Donna Valentine MRN: [id number] Date of Birth: 1941-09-16 Referring Provider: Dr. Ashok Pall  Encounter Date: 04/14/2017      OT End of Session - 04/14/17 1340    Visit Number 14   Number of Visits 18   Date for OT Re-Evaluation 04/25/17   Authorization Type UHC Medicare   Authorization Time Period before 18th visit   Authorization - Visit Number 14   Authorization - Number of Visits 18   OT Start Time 1301   OT Stop Time 1344   OT Time Calculation (min) 43 min   Activity Tolerance Patient tolerated treatment well   Behavior During Therapy [facility] for tasks assessed/performed      Past Medical History:  Diagnosis Date  . GBM (glioblastoma multiforme) ([place])     Past Surgical History:  Procedure Laterality Date  . APPENDECTOMY  1994  . APPLICATION OF CRANIAL NAVIGATION N/A 11/28/2016   Procedure: APPLICATION OF CRANIAL NAVIGATION;  Surgeon: Ashok Pall, MD;  Location: [place];  Service: Neurosurgery;  Laterality: N/A;  . CESAREAN SECTION     x3  . CRANIOTOMY Right 11/28/2016   Procedure: STERIOTACTIC PARIETAL CRANIOTOMY FOR RIGHT FRONTAL TUMOR RESECTION WITH BRAINLAB;  Surgeon: Ashok Pall, MD;  Location: [place];  Service: Neurosurgery;  Laterality: Right;    There were no vitals filed for this visit.      Subjective Assessment - 04/14/17 1326    Subjective  S: My hands are really shakey today for some reason.   Patient is accompained by: Family member  Daughter   Currently in Pain? No/denies            [facility] OT Assessment - 04/14/17 1327      Assessment   Diagnosis LUE Weakness S/P removal of right frontal lobe glioblastoma multiforme     Precautions   Precautions Other (comment)   Precaution Comments brain tumor, hx craniotomy, on active radiation                    OT Treatments/Exercises (OP) - 04/14/17 1306      Exercises   Exercises Shoulder;Hand     Shoulder Exercises: Standing   Protraction Theraband;12 reps   Theraband Level (Shoulder Protraction) Level 2 (Red)   Horizontal ABduction Theraband;12 reps   Theraband Level (Shoulder Horizontal ABduction) Level 2 (Red)   External Rotation Theraband;12 reps   Theraband Level (Shoulder External Rotation) Level 2 (Red)   Internal Rotation Theraband;12 reps   Theraband Level (Shoulder Internal Rotation) Level 2 (Red)   Flexion Theraband;12 reps   Theraband Level (Shoulder Flexion) Level 2 (Red)   ABduction Theraband;12 reps   Theraband Level (Shoulder ABduction) Level 2 (Red)   Extension Theraband;12 reps   Theraband Level (Shoulder Extension) Level 2 (Red)   Row Theraband;12 reps   Theraband Level (Shoulder Row) Level 2 (Red)     Hand Exercises   Theraputty Flatten;Roll;Grip;Pinch   Theraputty - Flatten red   Theraputty - Roll red   Theraputty - Grip red   Theraputty - Pinch red   Hand Gripper with Large Beads all beads gripper at 25#, 3/4# wrist weight   Hand Gripper with Medium Beads all beads gripper at 25#, 3/4# wrist weight                OT Education -  04/14/17 1325    Education provided Yes   Education Details provided pt with theraband shoulder strengthening for HEP   Person(s) Educated Patient;Child(ren)   Methods Explanation;Demonstration   Comprehension Verbalized understanding;Returned demonstration          OT Short Term Goals - 03/26/17 1133      OT SHORT TERM GOAL #1   Title Patient will be educated and independent with an HEP for improved functional use of LUE with all daily tasks.    Time 6   Period Weeks   Status On-going     OT SHORT TERM GOAL #2   Title Patient will return to prior level of function, using her left upper extremity actively with all tasks.    Time 6   Period Weeks   Status On-going     OT SHORT TERM  GOAL #3   Title Patient will improve left shoulder A/ROM to WNL for improved ability to reach into overhead cabinets.    Time 6   Period Weeks   Status On-going     OT SHORT TERM GOAL #4   Title Patient will improve left upper extremity strength to 4+/5 for improved ability to lift shopping bags.    Time 6   Period Weeks   Status Partially Met     OT SHORT TERM GOAL #5   Title Patient will improve left grip strength by 10 pounds and pinch strength by 6 pounds or more for improved ability to open containers and maintain grip on objects.     Time 6   Period Weeks   Status On-going     OT SHORT TERM GOAL #6   Title Patient will improve fine motor coordination needed to fasten jewelery by decreasing completion time on nine hole peg test by 10 seconds.    Time 6   Period Weeks   Status On-going     OT SHORT TERM GOAL #7   Title Patient will decrease non purposeful tremors from moderate to minimal for improved abilty to complete daily tasks safely.    Baseline 5/18: Goal deferred at this time due to unknown source of tremors.   Time 6   Period Weeks   Status Deferred                  Plan - 04/14/17 1358    Clinical Impression Statement A: Completed theraband shoulder exercises and added to HEP. Hand gripper activity completed with multiple rest breaks needed, presented as max difficulty for medium beads. Theraputty used for grip and pinch strengthening. VC needed for form and technique.   Plan P: Follow up on theraband shoulder exercises eith HEP. Focus on coordination tasks.      Patient will benefit from skilled therapeutic intervention in order to improve the following deficits and impairments:  Decreased coordination, Decreased range of motion, Decreased strength, Impaired UE functional use  Visit Diagnosis: Other lack of coordination    Problem List Patient Active Problem List   Diagnosis Date Noted  . Oral candida 01/08/2017  . Goals of care,  counseling/discussion   . Glioblastoma multiforme of frontal lobe ([place]) 12/01/2016  . Brain tumor ([place]) 11/28/2016  . 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 Left-sided weakness 11/20/2016  . 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 HYPERCHOLESTEROLEMIA, BORDERLINE 07/28/2008  . ANEMIA, MILD 07/28/2008  . DIVERTICULOSIS OF COLON 07/28/2008  . HEMATURIA, HX OF 07/28/2008  . COLONIC POLYPS 07/27/2008  . RIGHT BUNDLE  BRANCH BLOCK 07/27/2008  . PREMATURE ATRIAL CONTRACTIONS 07/27/2008  . HEMORRHOIDS 07/27/2008  . VENOUS INSUFFICIENCY 07/27/2008  . BACK PAIN, LUMBAR 07/27/2008  . SCOLIOSIS 07/27/2008  . PALPITATIONS, HX OF 07/27/2008    Luther Hearing, OT Student [phone] 04/14/2017, 3:05 PM  [place], [place], [address] Phone: [phone]   Fax:  [phone]  Name: Doriana Mazurkiewicz MRN: [id number] Date of Birth: Aug 20, 1941     This qualified practitioner was present in the room guiding the student in service delivery. Therapy student was participating in the provision of services, and the practitioner was not engaged in treating another patient or doing other tasks at the same time.  [name], OTR/L,CBIS  [phone]

## 2017-04-16 ENCOUNTER — Ambulatory Visit (HOSPITAL_COMMUNITY): Payer: Medicare Other | Admitting: Physical Therapy

## 2017-04-16 ENCOUNTER — Ambulatory Visit (HOSPITAL_COMMUNITY): Payer: Medicare Other | Admitting: Occupational Therapy

## 2017-04-16 ENCOUNTER — Encounter (HOSPITAL_COMMUNITY): Payer: Self-pay | Admitting: Occupational Therapy

## 2017-04-16 DIAGNOSIS — R278 Other lack of coordination: Secondary | ICD-10-CM

## 2017-04-16 DIAGNOSIS — M6281 Muscle weakness (generalized): Secondary | ICD-10-CM

## 2017-04-16 DIAGNOSIS — R2681 Unsteadiness on feet: Secondary | ICD-10-CM

## 2017-04-16 NOTE — Therapy (Signed)
[place], [place], [address] Phone: [phone]   Fax:  [phone]  Occupational Therapy Treatment  Patient Details  Name: Donna Valentine MRN: [id number] Date of Birth: Dec 12, 1940 Referring Provider: Dr. Ashok Pall  Encounter Date: 04/16/2017      OT End of Session - 04/16/17 1514    Visit Number 15   Number of Visits 18   Date for OT Re-Evaluation 04/25/17   Authorization Type UHC Medicare   Authorization Time Period before 18th visit   Authorization - Visit Number 15   Authorization - Number of Visits 18   OT Start Time 1435   OT Stop Time 1515   OT Time Calculation (min) 40 min   Activity Tolerance Patient tolerated treatment well   Behavior During Therapy [facility] for tasks assessed/performed      Past Medical History:  Diagnosis Date  . GBM (glioblastoma multiforme) ([place])     Past Surgical History:  Procedure Laterality Date  . APPENDECTOMY  1994  . APPLICATION OF CRANIAL NAVIGATION N/A 11/28/2016   Procedure: APPLICATION OF CRANIAL NAVIGATION;  Surgeon: Ashok Pall, MD;  Location: [place];  Service: Neurosurgery;  Laterality: N/A;  . CESAREAN SECTION     x3  . CRANIOTOMY Right 11/28/2016   Procedure: STERIOTACTIC PARIETAL CRANIOTOMY FOR RIGHT FRONTAL TUMOR RESECTION WITH BRAINLAB;  Surgeon: Ashok Pall, MD;  Location: [place];  Service: Neurosurgery;  Laterality: Right;    There were no vitals filed for this visit.      Subjective Assessment - 04/16/17 1427    Subjective  S: The right one isn't too bad today, the left is just ok. (tremors)   Currently in Pain? No/denies            [facility] OT Assessment - 04/16/17 1427      Assessment   Diagnosis LUE Weakness S/P removal of right frontal lobe glioblastoma multiforme     Precautions   Precautions Other (comment)   Precaution Comments brain tumor, hx craniotomy, on active radiation                   OT Treatments/Exercises  (OP) - 04/16/17 1436      Exercises   Exercises Shoulder;Hand     Hand Exercises   Theraputty Flatten;Pinch   Theraputty - Flatten red   Theraputty - Pinch red   Small Pegboard Pt able to place pegs with right hand with min difficulty. Able to place 17 pegs with left hand with peg set-up from right hand and with 1# wrist weight, increased time required     Fine Motor Coordination   Other Fine Motor Exercises Pt stacked high resistance sponges, able to stack sponges 3 wide and 3 high                  OT Short Term Goals - 03/26/17 1133      OT SHORT TERM GOAL #1   Title Patient will be educated and independent with an HEP for improved functional use of LUE with all daily tasks.    Time 6   Period Weeks   Status On-going     OT SHORT TERM GOAL #2   Title Patient will return to prior level of function, using her left upper extremity actively with all tasks.    Time 6   Period Weeks   Status On-going     OT SHORT TERM GOAL #3   Title Patient will improve left  shoulder A/ROM to WNL for improved ability to reach into overhead cabinets.    Time 6   Period Weeks   Status On-going     OT SHORT TERM GOAL #4   Title Patient will improve left upper extremity strength to 4+/5 for improved ability to lift shopping bags.    Time 6   Period Weeks   Status Partially Met     OT SHORT TERM GOAL #5   Title Patient will improve left grip strength by 10 pounds and pinch strength by 6 pounds or more for improved ability to open containers and maintain grip on objects.     Time 6   Period Weeks   Status On-going     OT SHORT TERM GOAL #6   Title Patient will improve fine motor coordination needed to fasten jewelery by decreasing completion time on nine hole peg test by 10 seconds.    Time 6   Period Weeks   Status On-going     OT SHORT TERM GOAL #7   Title Patient will decrease non purposeful tremors from moderate to minimal for improved abilty to complete daily tasks safely.     Baseline 5/18: Goal deferred at this time due to unknown source of tremors.   Time 6   Period Weeks   Status Deferred                  Plan - 04/16/17 1514    Clinical Impression Statement A: Session focusing on fine motor coordination exercises, pt with moderate tremors this session, able to complete small pegboard using LUE with right hand setting up pegs and frequent rest breaks. Also attempted sponge stacking task, mod difficulty. Pt reports she has not attempted her HEP yet but is planning to begin this week.    Plan P: Follow up on theraband HEP, continue with coordination and pinch strengthening tasks   Consulted and Agree with Plan of Care Patient      Patient will benefit from skilled therapeutic intervention in order to improve the following deficits and impairments:  Decreased coordination, Decreased range of motion, Decreased strength, Impaired UE functional use  Visit Diagnosis: Other lack of coordination  Muscle weakness (generalized)    Problem List Patient Active Problem List   Diagnosis Date Noted  . Oral candida 01/08/2017  . Goals of care, counseling/discussion   . Glioblastoma multiforme of frontal lobe ([place]) 12/01/2016  . Brain tumor ([place]) 11/28/2016  . 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 Left-sided weakness 11/20/2016  . 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 HYPERCHOLESTEROLEMIA, BORDERLINE 07/28/2008  . ANEMIA, MILD 07/28/2008  . DIVERTICULOSIS OF COLON 07/28/2008  . HEMATURIA, HX OF 07/28/2008  . COLONIC POLYPS 07/27/2008  . RIGHT BUNDLE BRANCH BLOCK 07/27/2008  . PREMATURE ATRIAL CONTRACTIONS 07/27/2008  . HEMORRHOIDS 07/27/2008  . VENOUS INSUFFICIENCY 07/27/2008  . BACK PAIN, LUMBAR 07/27/2008  . SCOLIOSIS 07/27/2008  . PALPITATIONS, HX OF 07/27/2008   Guadelupe Sabin, OTR/L  [phone] 04/16/2017, 3:16 PM  [place], [place], [address] Phone: [phone]   Fax:  [phone]  Name: Donna Valentine MRN: [id number] Date of Birth: 05-02-1941

## 2017-04-16 NOTE — Therapy (Signed)
[place], [place], [address] Phone: [phone]   Fax:  [phone]  Physical Therapy Treatment (Re-Assessment)  Patient Details  Name: Donna Valentine MRN: [id number] Date of Birth: 03/02/1941 Referring Provider: Ashok Pall   Encounter Date: 04/16/2017      PT End of Session - 04/16/17 1439    Visit Number 22   Number of Visits 25   Date for PT Re-Evaluation 05/07/17   Authorization Type UHC Medicare (G-codes and KX); G-codes done 07-Dec-2022 session    Authorization Time Period recert completed 2/63; 3/3-5/45; recert done 7/5   Authorization - Visit Number 60   Authorization - Number of Visits 32   PT Start Time 1347   PT Stop Time 1426   PT Time Calculation (min) 39 min   Equipment Utilized During Treatment Gait belt   Activity Tolerance Patient tolerated treatment well;Patient limited by fatigue   Behavior During Therapy [facility] for tasks assessed/performed      Past Medical History:  Diagnosis Date  . GBM (glioblastoma multiforme) ([place])     Past Surgical History:  Procedure Laterality Date  . APPENDECTOMY  1994  . APPLICATION OF CRANIAL NAVIGATION N/A 11/28/2016   Procedure: APPLICATION OF CRANIAL NAVIGATION;  Surgeon: Ashok Pall, MD;  Location: [place];  Service: Neurosurgery;  Laterality: N/A;  . CESAREAN SECTION     x3  . CRANIOTOMY Right 11/28/2016   Procedure: STERIOTACTIC PARIETAL CRANIOTOMY FOR RIGHT FRONTAL TUMOR RESECTION WITH BRAINLAB;  Surgeon: Ashok Pall, MD;  Location: [place];  Service: Neurosurgery;  Laterality: Right;    There were no vitals filed for this visit.      Subjective Assessment - 04/16/17 1349    Subjective Patient arrives stating she is doing well, she is still having issues with her ankles swelling however. She continues to feel that her balance is not the best. Her new shoes should be coming soon, they ahd to order her size.    Pertinent History glioblastoma of R frontal  lobe, craniotomy done on 11/28/16   Patient Stated Goals improve strength, enhance independence    Currently in Pain? No/denies            [facility] PT Assessment - 04/16/17 0001      Assessment   Medical Diagnosis glioblastoma    Referring Provider Ashok Pall    Onset Date/Surgical Date 11/20/16   Next MD Visit Dr. Christella Noa upcoming      Precautions   Precautions Other (comment)   Precaution Comments brain tumor, hx craniotomy, on active chemo       Balance Screen   Has the patient fallen in the past 6 months No   Has the patient had a decrease in activity level because of a fear of falling?  No   Is the patient reluctant to leave their home because of a fear of falling?  No     Prior Function   Level of Independence Independent   Vocation Retired   Leisure shopping, grandkids, church activities, eating out     Strength   Right Hip Flexion 4-/5   Right Hip ABduction 3/5   Left Hip Flexion 3+/5   Left Hip ABduction 3-/5   Right Knee Flexion 4+/5   Right Knee Extension 5/5   Left Knee Flexion 4+/5   Left Knee Extension 4+/5   Right Ankle Dorsiflexion 5/5   Left Ankle Dorsiflexion 5/5     6 minute walk test results  Aerobic Endurance Distance Walked 548   Endurance additional comments 3MWT      Dynamic Gait Index   Level Surface Normal   Change in Gait Speed Normal   Gait with Horizontal Head Turns Mild Impairment   Gait with Vertical Head Turns Normal   Gait and Pivot Turn Mild Impairment   Step Over Obstacle Normal   Step Around Obstacles Normal   Steps Mild Impairment   Total Score 21                             PT Education - 04/16/17 1438    Education provided Yes   Education Details importance of adhering to HEP and regular walking program; role of exercise/activity in managing fatigue/preventing dramatic strength/balance/function loss in face of active cancer treatments; insurance limitations; importance of developing independent  activity program she will adhere to during course of chemo; POC moving forward    Person(s) Educated Patient   Methods Explanation   Comprehension Verbalized understanding          PT Short Term Goals - 04/16/17 1412      PT SHORT TERM GOAL #1   Title Patient to be able to identify 5/5 safety factors/concepts in order to demonstrate good safety and reduced fall risk    Baseline 7/5- 3/5   Time 2   Period Weeks   Status Partially Met     PT SHORT TERM GOAL #2   Title Patient to be able to ambulate over 755f in 3MWT in order to demonstrate improved general mobility and community access    Baseline 7/5- 526   Time 2   Period Weeks   Status On-going     PT SHORT TERM GOAL #3   Title Patient to be independent in correctly and consistently performing targeted HEP, to be updated as appropriate    Baseline 7/5- has not been doing a lot of HEP due to fatigue    Time 1   Period Weeks   Status On-going     PT SHORT TERM GOAL #4   Title Patient to be performing regular progressive walking program, at least 10-15 minutes in duration and 1-2 times per day, in order to assist in addressing fatigue and functional weakness    Baseline 7/5- has been very busy and then plans to start walking after she gets her new shoes alter this week    Time 3   Period Weeks   Status On-going           PT Long Term Goals - 04/16/17 1417      PT LONG TERM GOAL #1   Title Patient to demonstrate functional strength 5/5 in all tested muscle groups in order to improve overall gait and balance    Baseline 7/5- weak hips and core    Time 4   Period Weeks   Status Partially Met     PT LONG TERM GOAL #2   Title Patient to score 24 on DGI in order to show improved dynamic balance skills and overall reduced fall risk    Baseline 7/5- 21/24   Time 4   Period Weeks   Status Partially Met     PT LONG TERM GOAL #3   Title Patient to be participatory in regular aerobic exercise program, at least 20  minutes in duration and 4 days per week, in order to maintain functional gains and imrpove overall health status  Baseline 05/10/23- further discussed today    Time 4   Period Weeks   Status On-going               Plan - May 09, 2017 1441    Clinical Impression Statement Re-assessment performed today. Patient is currently taking an active monthly/progressive dosage course of chemo; she states that the most recent round of chemo seems to have very much affected her, and this is confirmed by her objective measures obtained today. She shows just general maintenance of functional strength however shows reduced gait speed/distance/tolerance as well as a small reduction in score on DGI today. Strongly suspect that this is due to physical effects/side-effects of active chemo. Recommend short extension of skilled PT services, with strong focus on development of appropriate independent exercise/activity program before likely DC at the end of this month.    Rehab Potential Good   Clinical Impairments Affecting Rehab Potential (+) high PLOF, very active at baseline, good recovery after craniotomy; (-) may be starting radiation/chemo soon/effects of these treatments on tolerance to rehab    PT Frequency 1x / week   PT Duration 3 weeks   PT Next Visit Plan provide HEP for general use ("good days"), further discuss and develop extensive independent program. CKC weighted exercise, balance. Likely DC at the end of the month.    PT Home Exercise Plan off days: Seated hip adduction squeezes x10, seated clamshells x10 with red TB, seated knee extension/flexion with red TB x10, seated or standing heel/toe raises x20   Consulted and Agree with Plan of Care Patient      Patient will benefit from skilled therapeutic intervention in order to improve the following deficits and impairments:  Abnormal gait, Decreased coordination, Decreased mobility, Decreased activity tolerance, Decreased strength, Decreased balance,  Difficulty walking  Visit Diagnosis: Other lack of coordination - Plan: PT plan of care cert/re-cert  Muscle weakness (generalized) - Plan: PT plan of care cert/re-cert  Unsteadiness on feet - Plan: PT plan of care cert/re-cert       G-Codes - 2017-05-09 1442    Functional Assessment Tool Used (Outpatient Only) Based on skilled clincial assessment of strenght, gait, dynamic balance, overall fall risk, functional activity tolerance    Functional Limitation Mobility: Walking and moving around   Mobility: Walking and Moving Around Current Status (N4627) At least 40 percent but less than 60 percent impaired, limited or restricted   Mobility: Walking and Moving Around Goal Status (O3500) At least 1 percent but less than 20 percent impaired, limited or restricted      Problem List Patient Active Problem List   Diagnosis Date Noted  . Oral candida 01/08/2017  . Goals of care, counseling/discussion   . Glioblastoma multiforme of frontal lobe ([place]) 12/01/2016  . Brain tumor ([place]) 11/28/2016  . 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 Left-sided weakness 11/20/2016  . 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 HYPERCHOLESTEROLEMIA, BORDERLINE 07/28/2008  . ANEMIA, MILD 07/28/2008  . DIVERTICULOSIS OF COLON 07/28/2008  . HEMATURIA, HX OF 07/28/2008  . COLONIC POLYPS 07/27/2008  . RIGHT BUNDLE BRANCH BLOCK 07/27/2008  . PREMATURE ATRIAL CONTRACTIONS 07/27/2008  . HEMORRHOIDS 07/27/2008  . VENOUS INSUFFICIENCY 07/27/2008  . BACK PAIN, LUMBAR 07/27/2008  . SCOLIOSIS 07/27/2008  . PALPITATIONS, HX OF 07/27/2008    Deniece Ree PT, DPT [place] [address] [place], [place], [address] Phone: [phone]   Fax:  [phone]  Name: Donna Valentine MRN: [id number] Date of Birth: 29-Dec-1940

## 2017-04-17 ENCOUNTER — Telehealth: Payer: Self-pay | Admitting: Neurology

## 2017-04-17 NOTE — Telephone Encounter (Signed)
Patient daughter states that the 1/2 pill is not working any longer and wants to know if she could do a whole pill please call

## 2017-04-17 NOTE — Telephone Encounter (Signed)
Spoke with daughter and made her aware okay to take Primidone 50 mg -1 tablet.

## 2017-04-21 ENCOUNTER — Encounter (HOSPITAL_COMMUNITY): Payer: Self-pay | Admitting: Occupational Therapy

## 2017-04-21 ENCOUNTER — Ambulatory Visit (HOSPITAL_COMMUNITY): Payer: Medicare Other | Admitting: Occupational Therapy

## 2017-04-21 ENCOUNTER — Ambulatory Visit (HOSPITAL_COMMUNITY): Payer: Medicare Other

## 2017-04-21 DIAGNOSIS — R278 Other lack of coordination: Secondary | ICD-10-CM

## 2017-04-21 NOTE — Therapy (Addendum)
[place], [place], [address] Phone: [phone]   Fax:  [phone]  Occupational Therapy Treatment  Patient Details  Name: Donna Valentine MRN: [id number] Date of Birth: [birth year] Referring Provider: Dr. Ashok Pall  Encounter Date: 04/21/2017      OT End of Session - 04/21/17 1439    Visit Number 16   Number of Visits 18   Date for OT Re-Evaluation 04/25/17   Authorization Type UHC Medicare   Authorization Time Period before 18th visit   Authorization - Visit Number 16   Authorization - Number of Visits 18   OT Start Time 6734   OT Stop Time 1432   OT Time Calculation (min) 45 min   Activity Tolerance Patient tolerated treatment well   Behavior During Therapy [facility] for tasks assessed/performed      Past Medical History:  Diagnosis Date  . GBM (glioblastoma multiforme) ([place])     Past Surgical History:  Procedure Laterality Date  . APPENDECTOMY  1994  . APPLICATION OF CRANIAL NAVIGATION N/A 11/28/2016   Procedure: APPLICATION OF CRANIAL NAVIGATION;  Surgeon: Ashok Pall, MD;  Location: [place];  Service: Neurosurgery;  Laterality: N/A;  . CESAREAN SECTION     x3  . CRANIOTOMY Right 11/28/2016   Procedure: STERIOTACTIC PARIETAL CRANIOTOMY FOR RIGHT FRONTAL TUMOR RESECTION WITH BRAINLAB;  Surgeon: Ashok Pall, MD;  Location: [place];  Service: Neurosurgery;  Laterality: Right;    There were no vitals filed for this visit.      Subjective Assessment - 04/21/17 1357    Subjective  S: I haven't been able to eat like normal since Sunday becasue of my shaking.   Currently in Pain? No/denies            [facility] OT Assessment - 04/21/17 0001      Assessment   Diagnosis LUE Weakness S/P removal of right frontal lobe glioblastoma multiforme     Precautions   Precautions Other (comment)   Precaution Comments brain tumor, hx craniotomy, on active radiation                   OT  Treatments/Exercises (OP) - 04/21/17 1359      ADLs   Eating Pt completed functional eating task with weighted spoon and swivel spoon scopping corn kernals and wet pom-poms both simulating foods she would eat and putting them into a bowl. Pt also scopped them near her mouth and dropped them into bowl below chin. Swivel spoon deemed more successful for pt.  Pt used 3/4# wrist weight during activity.     Exercises   Exercises Hand     Hand Exercises   Hand Gripper with Large Beads all beads gripper at 25#, 3/4# wrist weight   Hand Gripper with Medium Beads all beads gripper at 25#, 3/4# wrist weight   Sponges Pt stacked hard sponges with hand (wrist weight 3/4#) with max difficulty. Pt then used tongs to complete stcaking task and presented with mod difficulty using therapist assistance to hold stacked sponges. Pt picked up one soft sponge at a time to see how many she could grip at once: 11, 13.                OT Education - 04/21/17 1429    Education provided Yes   Education Details Provided pt with handout of where to find swivel spoon to help with shakiness when eating   Person(s) Educated Patient   Methods Explanation;Demonstration;Handout;Verbal  cues   Comprehension Verbalized understanding;Returned demonstration          OT Short Term Goals - 03/26/17 1133      OT SHORT TERM GOAL #1   Title Patient will be educated and independent with an HEP for improved functional use of LUE with all daily tasks.    Time 6   Period Weeks   Status On-going     OT SHORT TERM GOAL #2   Title Patient will return to prior level of function, using her left upper extremity actively with all tasks.    Time 6   Period Weeks   Status On-going     OT SHORT TERM GOAL #3   Title Patient will improve left shoulder A/ROM to WNL for improved ability to reach into overhead cabinets.    Time 6   Period Weeks   Status On-going     OT SHORT TERM GOAL #4   Title Patient will improve left  upper extremity strength to 4+/5 for improved ability to lift shopping bags.    Time 6   Period Weeks   Status Partially Met     OT SHORT TERM GOAL #5   Title Patient will improve left grip strength by 10 pounds and pinch strength by 6 pounds or more for improved ability to open containers and maintain grip on objects.     Time 6   Period Weeks   Status On-going     OT SHORT TERM GOAL #6   Title Patient will improve fine motor coordination needed to fasten jewelery by decreasing completion time on nine hole peg test by 10 seconds.    Time 6   Period Weeks   Status On-going     OT SHORT TERM GOAL #7   Title Patient will decrease non purposeful tremors from moderate to minimal for improved abilty to complete daily tasks safely.    Baseline 5/18: Goal deferred at this time due to unknown source of tremors.   Time 6   Period Weeks   Status Deferred                  Plan - 04/21/17 1531    Clinical Impression Statement A: Pt requested wrist weights at start of today's session. Completed stacking spoonges and hand gripper activities with VC needed for form and technique. Pt reports having trouble eating due to tremors. Encouraged pt to purchase swivel spoon and completed eating simulation activity using this spoon. Swivel spoon presented as successful for pt.    Plan P: Follow up on swivel spoon at home. Complete re-evaluation. Pt is to bring hand weights from home to try during session.      Patient will benefit from skilled therapeutic intervention in order to improve the following deficits and impairments:  Decreased coordination, Decreased range of motion, Decreased strength, Impaired UE functional use  Visit Diagnosis: Other lack of coordination    Problem List Patient Active Problem List   Diagnosis Date Noted  . Oral candida 01/08/2017  . Goals of care, counseling/discussion   . Glioblastoma multiforme of frontal lobe ([place]) 12/01/2016  . Brain tumor ([place])  11/28/2016  . 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 Left-sided weakness 11/20/2016  . 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 HYPERCHOLESTEROLEMIA, BORDERLINE 07/28/2008  . ANEMIA, MILD 07/28/2008  . DIVERTICULOSIS OF COLON 07/28/2008  . HEMATURIA, HX OF 07/28/2008  . COLONIC POLYPS 07/27/2008  . RIGHT BUNDLE BRANCH BLOCK 07/27/2008  .  PREMATURE ATRIAL CONTRACTIONS 07/27/2008  . HEMORRHOIDS 07/27/2008  . VENOUS INSUFFICIENCY 07/27/2008  . BACK PAIN, LUMBAR 07/27/2008  . SCOLIOSIS 07/27/2008  . PALPITATIONS, HX OF 07/27/2008    Luther Hearing, OT Student [phone] 04/21/2017, 4:18 PM  [facility] [address] [place], [place], [address] Phone: [phone]   Fax:  [phone]  Name: Donna Valentine MRN: [id number] Date of Birth: October 14, 1940    This qualified practitioner was present in the room guiding the student in service delivery. Therapy student was participating in the provision of services, and the practitioner was not engaged in treating another patient or doing other tasks at the same time.  Guadelupe Sabin, OTR/L  [phone] 04/21/2017

## 2017-04-22 ENCOUNTER — Telehealth: Payer: Self-pay

## 2017-04-22 NOTE — Telephone Encounter (Signed)
Pt daughter called to see if pt should take temodar this Sunday or not. Discussed with Dr. Irene Limbo at appt, and he said a few days wait would be okay. Labs and doctor visit scheduled for 7/17. Pt will hold chemo until after doctor visit to discuss plan. Pt's daughter verbalized understanding.  Called [place] with oral chemo to discuss. Agreed to this plan.

## 2017-04-23 ENCOUNTER — Ambulatory Visit (HOSPITAL_COMMUNITY): Payer: Medicare Other | Admitting: Physical Therapy

## 2017-04-23 ENCOUNTER — Ambulatory Visit (HOSPITAL_COMMUNITY): Payer: Medicare Other | Admitting: Occupational Therapy

## 2017-04-23 ENCOUNTER — Encounter (HOSPITAL_COMMUNITY): Payer: Self-pay | Admitting: Occupational Therapy

## 2017-04-23 DIAGNOSIS — R278 Other lack of coordination: Secondary | ICD-10-CM

## 2017-04-23 DIAGNOSIS — R2681 Unsteadiness on feet: Secondary | ICD-10-CM

## 2017-04-23 DIAGNOSIS — M6281 Muscle weakness (generalized): Secondary | ICD-10-CM

## 2017-04-23 NOTE — Therapy (Addendum)
[facility], [place], [address] Phone: [phone]   Fax:  [phone]  Occupational Therapy Reassessment, Treatment, and Discharge Summary  Patient Details  Name: Donna Valentine MRN: [id number] Date of Birth: 03-24-1941 Referring Provider: Dr. Ashok Pall  Encounter Date: 04/23/2017      OT End of Session - 04/23/17 1353    Visit Number 17   Number of Visits 18   Authorization Type UHC Medicare   Authorization Time Period before 18th visit   Authorization - Visit Number 17   Authorization - Number of Visits 18   OT Start Time 1301   OT Stop Time 1342   OT Time Calculation (min) 41 min   Activity Tolerance Patient tolerated treatment well   Behavior During Therapy [facility] for tasks assessed/performed      Past Medical History:  Diagnosis Date  . GBM (glioblastoma multiforme) ([place])     Past Surgical History:  Procedure Laterality Date  . APPENDECTOMY  1994  . APPLICATION OF CRANIAL NAVIGATION N/A 11/28/2016   Procedure: APPLICATION OF CRANIAL NAVIGATION;  Surgeon: Ashok Pall, MD;  Location: [place];  Service: Neurosurgery;  Laterality: N/A;  . CESAREAN SECTION     x3  . CRANIOTOMY Right 11/28/2016   Procedure: STERIOTACTIC PARIETAL CRANIOTOMY FOR RIGHT FRONTAL TUMOR RESECTION WITH BRAINLAB;  Surgeon: Ashok Pall, MD;  Location: [place];  Service: Neurosurgery;  Laterality: Right;    There were no vitals filed for this visit.      Subjective Assessment - 04/23/17 1346    Subjective  S: My tremors are just still so bad and I think it's because of my steroids.   Currently in Pain? No/denies            [facility] OT Assessment - 04/23/17 1303      Assessment   Diagnosis LUE Weakness S/P removal of right frontal lobe glioblastoma multiforme     Precautions   Precautions Other (comment)   Precaution Comments brain tumor, hx craniotomy, on active radiation      Coordination   9 Hole Peg Test Left    Left 9 Hole Peg Test [id number]"  previous: 1'35"   Tremors present and worsen when rying to do any activities     ROM / Strength   AROM / PROM / Strength AROM;Strength     AROM   Overall AROM  Within functional limits for tasks performed   AROM Assessment Site Shoulder   Right/Left Shoulder Left   Left Shoulder Flexion 155 Degrees  previous: 130   Left Shoulder ABduction 145 Degrees  previous: 128   Left Shoulder Internal Rotation 90 Degrees  previous: 55   Left Shoulder External Rotation 90 Degrees  previous:75     Strength   Overall Strength Within functional limits for tasks performed   Strength Assessment Site Shoulder;Elbow;Wrist   Right/Left Shoulder Left   Left Shoulder Flexion 4+/5  previous: 4-/5   Left Shoulder ABduction 4+/5  previous: 4-/5   Left Shoulder Internal Rotation 5/5  previous: 4-/5   Left Shoulder External Rotation 5/5  previous: 4-/5   Right/Left Elbow Left   Left Elbow Flexion 5/5  previous: 4/5   Left Elbow Extension 5/5  previous: 4/5   Right/Left Wrist Left   Left Wrist Flexion 5/5  previous: 5/5   Left Wrist Extension 5/5  previous: 5/5   Right/Left hand Left   Left Hand Grip (lbs) 50  previous: 40   Left Hand  Lateral Pinch 12 lbs  previous: 8   Left Hand 3 Point Pinch 11 lbs  previous: 9                          OT Education - 05-23-17 1352    Education provided Yes   Education Details educated pt on achieved goals and discussed D/C from therapy; encouraged pt to continue HEP exercises previously given; provided handout for energy conservation   Person(s) Educated Patient   Methods Explanation;Handout   Comprehension Verbalized understanding          OT Short Term Goals - 05-23-2017 1323      OT SHORT TERM GOAL #1   Title Patient will be educated and independent with an HEP for improved functional use of LUE with all daily tasks.    Time 6   Period Weeks   Status Achieved     OT SHORT TERM GOAL #2   Title  Patient will return to prior level of function, using her left upper extremity actively with all tasks.    Time 6   Period Weeks   Status Achieved     OT SHORT TERM GOAL #3   Title Patient will improve left shoulder A/ROM to WNL for improved ability to reach into overhead cabinets.    Time 6   Period Weeks   Status Partially Met     OT SHORT TERM GOAL #4   Title Patient will improve left upper extremity strength to 4+/5 for improved ability to lift shopping bags.    Time 6   Period Weeks   Status Achieved     OT SHORT TERM GOAL #5   Title Patient will improve left grip strength by 10 pounds and pinch strength by 6 pounds or more for improved ability to open containers and maintain grip on objects.     Time 6   Period Weeks   Status Partially Met     OT SHORT TERM GOAL #6   Title Patient will improve fine motor coordination needed to fasten jewelery by decreasing completion time on nine hole peg test by 10 seconds.    Time 6   Period Weeks   Status Deferred     OT SHORT TERM GOAL #7   Title Patient will decrease non purposeful tremors from moderate to minimal for improved abilty to complete daily tasks safely.    Baseline 5/18: Goal deferred at this time due to unknown source of tremors.   Time 6   Period Weeks   Status Deferred                  Plan - 23-May-2017 1354    Clinical Impression Statement A: Session focused on reassessment. Pt achieved three short term goals with two partially met and one deferred. Pt increased grip strength, LUE strength and LUE A/ROM to her normal range. Pt still needs to improve pinch strength. Goal was deferred due to tremors increasing over course of therapy due to medications. Pt is to be discharged after today's session with HEP to continue outside of therapy.   Plan P: D/C pt after today's session.      Patient will benefit from skilled therapeutic intervention in order to improve the following deficits and impairments:   Decreased coordination, Decreased range of motion, Decreased strength, Impaired UE functional use  Visit Diagnosis: Other lack of coordination      G-Codes - 05-23-17 1406  Functional Assessment Tool Used (Outpatient only) clinical judgement/observation   Functional Limitation Carrying, moving and handling objects   Carrying, Moving and Handling Objects Goal Status (T9787) At least 1 percent but less than 20 percent impaired, limited or restricted   Carrying, Moving and Handling Objects Discharge Status [phone]) At least 40 percent but less than 60 percent impaired, limited or restricted      Problem List Patient Active Problem List   Diagnosis Date Noted  . Oral candida 01/08/2017  . Goals of care, counseling/discussion   . Glioblastoma multiforme of frontal lobe ([place]) 12/01/2016  . Brain tumor ([place]) 11/28/2016  . 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 Left-sided weakness 11/20/2016  . 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 HYPERCHOLESTEROLEMIA, BORDERLINE 07/28/2008  . ANEMIA, MILD 07/28/2008  . DIVERTICULOSIS OF COLON 07/28/2008  . HEMATURIA, HX OF 07/28/2008  . COLONIC POLYPS 07/27/2008  . RIGHT BUNDLE BRANCH BLOCK 07/27/2008  . PREMATURE ATRIAL CONTRACTIONS 07/27/2008  . HEMORRHOIDS 07/27/2008  . VENOUS INSUFFICIENCY 07/27/2008  . BACK PAIN, LUMBAR 07/27/2008  . SCOLIOSIS 07/27/2008  . PALPITATIONS, HX OF 07/27/2008    Luther Hearing, OT Student [phone] 04/23/2017, 2:07 PM  [facility] [address] [place], [place], [address] Phone: [phone]   Fax:  [phone]  Name: Donna Valentine MRN: [id number] Date of Birth: Feb 23, 1941   This qualified practitioner was present in the room guiding the student in service delivery. Therapy student was participating in the provision of services, and the practitioner was not engaged in treating another patient or  doing other tasks at the same time.  Guadelupe Sabin, OTR/L  [phone] 04/23/2017  OCCUPATIONAL THERAPY DISCHARGE SUMMARY  Visits from Start of Care: 17 visits   Current functional level related to goals / functional outcomes: See above-pt has improved her grip strength, LUE A/ROM and strength which has increased her functional reaching independence and ability to hold and grasp objects with left hand. Pt is implementing compensatory strategies with success.   Remaining deficits: Tremors continue to limit fine motor tasks.   Education / Equipment: Provided pt with energy conservation booklet and discussed adaptive equipment for eating.  Plan: Patient agrees to discharge.  Patient goals were met. Patient is being discharged due to meeting the stated rehab goals.  ?????

## 2017-04-23 NOTE — Therapy (Signed)
[facility] [address] [place], [place], [address] Phone: [phone]   Fax:  [phone]  Physical Therapy Treatment  Patient Details  Name: Donna Valentine MRN: [id number] Date of Birth: 29-Oct-1940 Referring Provider: Coletta Memos   Encounter Date: 04/23/2017      PT End of Session - 04/23/17 1654    Visit Number 23   Number of Visits 25   Date for PT Re-Evaluation 05/07/17   Authorization Type UHC Medicare (G-codes and KX); G-codes done Mar 29, 2023 session    Authorization Time Period recert completed 5/16; 5/9-6/27; recert done 7/5   Authorization - Visit Number 23   Authorization - Number of Visits 32   PT Start Time 1353  Nustep not included in billing    PT Stop Time 1426   PT Time Calculation (min) 33 min   Activity Tolerance Patient tolerated treatment well;Patient limited by fatigue   Behavior During Therapy [facility] for tasks assessed/performed      Past Medical History:  Diagnosis Date  . GBM (glioblastoma multiforme) ([facility])     Past Surgical History:  Procedure Laterality Date  . APPENDECTOMY  1994  . APPLICATION OF CRANIAL NAVIGATION N/A 11/28/2016   Procedure: APPLICATION OF CRANIAL NAVIGATION;  Surgeon: Coletta Memos, MD;  [facility];  Service: Neurosurgery;  Laterality: N/A;  . CESAREAN SECTION     x3  . CRANIOTOMY Right 11/28/2016   Procedure: STERIOTACTIC PARIETAL CRANIOTOMY FOR RIGHT FRONTAL TUMOR RESECTION WITH BRAINLAB;  Surgeon: Coletta Memos, MD;  Location: [facility] OR;  Service: Neurosurgery;  Laterality: Right;    There were no vitals filed for this visit.      Subjective Assessment - 04/23/17 1355    Subjective Patient arrives stating she has fatigue 2/10,  everything else is good. Her ankles continue to swell, she is going to pick up her new shoes soon.    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 No/denies                         [facility] Adult PT Treatment/Exercise - 04/23/17 0001      Knee/Hip Exercises: Aerobic   Nustep Nustep L2 x8 minutes [place] 4 80SPM   not included in billing     Knee/Hip Exercises: Standing   Lateral Step Up Both;1 set;15 reps   Lateral Step Up Limitations 4 inch box U HHA    Forward Step Up Both;15 reps   Forward Step Up Limitations 4 inch box U HHA    Rocker Board 2 minutes   Rocker Board Limitations AP and lateral U HHA      Knee/Hip Exercises: Seated   Sit to Sand 15 reps;without UE support             Balance Exercises - 04/23/17 1421      Balance Exercises: Standing   Tandem Stance Eyes open;Foam/compliant surface;3 reps;15 secs           PT Education - 04/23/17 1653    Education provided Yes   Education Details HEp updates for "on" days; importance of compliance with HEP/regular exercise and walking program    Person(s) Educated Patient   Methods Explanation;Handout   Comprehension Verbalized understanding;Returned demonstration          PT Short Term Goals - 04/16/17 1412      PT SHORT TERM  GOAL #1   Title Patient to be able to identify 5/5 safety factors/concepts in order to demonstrate good safety and reduced fall risk    Baseline 7/5- 3/5   Time 2   Period Weeks   Status Partially Met     PT SHORT TERM GOAL #2   Title Patient to be able to ambulate over 726f in 3MWT in order to demonstrate improved general mobility and community access    Baseline 7/5- 526   Time 2   Period Weeks   Status On-going     PT SHORT TERM GOAL #3   Title Patient to be independent in correctly and consistently performing targeted HEP, to be updated as appropriate    Baseline 7/5- has not been doing a lot of HEP due to fatigue    Time 1   Period Weeks   Status On-going     PT SHORT TERM GOAL #4   Title Patient to be performing regular progressive walking program, at least 10-15 minutes in  duration and 1-2 times per day, in order to assist in addressing fatigue and functional weakness    Baseline 7/5- has been very busy and then plans to start walking after she gets her new shoes alter this week    Time 3   Period Weeks   Status On-going           PT Long Term Goals - 04/16/17 1417      PT LONG TERM GOAL #1   Title Patient to demonstrate functional strength 5/5 in all tested muscle groups in order to improve overall gait and balance    Baseline 7/5- weak hips and core    Time 4   Period Weeks   Status Partially Met     PT LONG TERM GOAL #2   Title Patient to score 24 on DGI in order to show improved dynamic balance skills and overall reduced fall risk    Baseline 7/5- 21/24   Time 4   Period Weeks   Status Partially Met     PT LONG TERM GOAL #3   Title Patient to be participatory in regular aerobic exercise program, at least 20 minutes in duration and 4 days per week, in order to maintain functional gains and imrpove overall health status    Baseline 7/5- further discussed today    Time 4   Period Weeks   Status On-going               Plan - 04/23/17 1656    Clinical Impression Statement Patient arrives after OT, very pleasant and without significant fatigue today. Began session on Nustep (not included in billing) and continued to educate regarding importance of consistent walking program and participation with correct form of HEP. Otherwise focused on CKC exercises this session including practice of exercises/activities that were assigned as part of "on" program. Plan to further build extensive collection of exercises for "on" days moving forward. Continue to recommend DC from PT in 2 sessions.    Rehab Potential Good   Clinical Impairments Affecting Rehab Potential (+) high PLOF, very active at baseline, good recovery after craniotomy; (-) may be starting radiation/chemo soon/effects of these treatments on tolerance to rehab    PT Frequency 1x / week    PT Duration 3 weeks   PT Treatment/Interventions ADLs/Self Care Home Management;Biofeedback;Gait training;Stair training;Functional mobility training;Therapeutic activities;Therapeutic exercise;Balance training;Neuromuscular re-education;Patient/family education;Manual techniques;Energy conservation;Taping   PT Next Visit Plan DC in 2 more sessions. Continue to  HEP for general use ("good days"), further discuss and develop extensive independent program. CKC weighted exercise, balance. Likely DC at the end of the month.    PT Home Exercise Plan off days: Seated hip adduction squeezes x10, seated clamshells x10 with red TB, seated knee extension/flexion with red TB x10, seated or standing heel/toe raises x20; "on" days: forward and lateral step ups, sit to stands no UEs, peddler bike    Consulted and Agree with Plan of Care Patient      Patient will benefit from skilled therapeutic intervention in order to improve the following deficits and impairments:  Abnormal gait, Decreased coordination, Decreased mobility, Decreased activity tolerance, Decreased strength, Decreased balance, Difficulty walking  Visit Diagnosis: Other lack of coordination  Muscle weakness (generalized)  Unsteadiness on feet     Problem List Patient Active Problem List   Diagnosis Date Noted  . Oral candida 01/08/2017  . Goals of care, counseling/discussion   . Glioblastoma multiforme of frontal lobe ([place]) 12/01/2016  . Brain tumor ([employer] [employer]) 11/28/2016  . 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 Left-sided weakness 11/20/2016  . 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 HYPERCHOLESTEROLEMIA, BORDERLINE 07/28/2008  . ANEMIA, MILD 07/28/2008  . DIVERTICULOSIS OF COLON 07/28/2008  . HEMATURIA, HX OF 07/28/2008  . COLONIC POLYPS 07/27/2008  . RIGHT BUNDLE BRANCH BLOCK 07/27/2008  . PREMATURE ATRIAL CONTRACTIONS 07/27/2008  . HEMORRHOIDS 07/27/2008  . VENOUS INSUFFICIENCY  07/27/2008  . BACK PAIN, LUMBAR 07/27/2008  . SCOLIOSIS 07/27/2008  . PALPITATIONS, HX OF 07/27/2008     Deniece Ree PT, DPT [facility] [address] [place], [place], [address] Phone: [phone]   Fax:  [phone]  Name: Ritisha Deitrick MRN: [id number] Date of Birth: 01-18-1941

## 2017-04-23 NOTE — Patient Instructions (Signed)
Forward    Facing step, place one leg on step, flexed at hip. You may hold on with one hand. Step up slowly, bringing hips in line with knee and shoulder. Bring other foot onto step and stand up tall. Reverse process to step back down. Repeat with other leg. Do _10-15___ repetitions, _1-2___ sets.  http://bt.exer.us/155   Copyright  VHI. All rights reserved.   Lateral Step Up    Stand to side of step. Step up leading with left leg. Step back down to where you started. You may hold onto the railing with one hand. Perform _15__ reps, 1-2 times per day.  Copyright  VHI. All rights reserved.    SIT TO STAND - NO SUPPORT  Start by scooting close to the front of the chair.  Next, lean forward at your trunk and reach forward with your arms and rise to standing without using your hands to push off from the chair or other object.   Use your arms as a counter-balance by reaching forward when in sitting and lower them as you approach standing.   Repeat 15 times, 1-2 times per day.

## 2017-04-27 ENCOUNTER — Ambulatory Visit (HOSPITAL_COMMUNITY): Payer: Medicare Other | Admitting: Physical Therapy

## 2017-04-27 ENCOUNTER — Ambulatory Visit (HOSPITAL_COMMUNITY): Payer: Medicare Other

## 2017-04-28 ENCOUNTER — Ambulatory Visit (HOSPITAL_BASED_OUTPATIENT_CLINIC_OR_DEPARTMENT_OTHER): Payer: Medicare Other | Admitting: Hematology

## 2017-04-28 ENCOUNTER — Telehealth: Payer: Self-pay | Admitting: Hematology

## 2017-04-28 ENCOUNTER — Encounter: Payer: Self-pay | Admitting: Hematology

## 2017-04-28 ENCOUNTER — Other Ambulatory Visit (HOSPITAL_BASED_OUTPATIENT_CLINIC_OR_DEPARTMENT_OTHER): Payer: Medicare Other

## 2017-04-28 ENCOUNTER — Ambulatory Visit (HOSPITAL_COMMUNITY)
Admission: RE | Admit: 2017-04-28 | Discharge: 2017-04-28 | Disposition: A | Discharge status: Home / Self Care | Payer: Medicare Other | Source: Ambulatory Visit | Attending: Hematology | Admitting: Hematology

## 2017-04-28 ENCOUNTER — Ambulatory Visit (HOSPITAL_COMMUNITY): Admission: RE | Admit: 2017-04-28 | Payer: Medicare Other | Source: Ambulatory Visit

## 2017-04-28 VITALS — BP 138/68 | HR 72 | Temp 98.0°F | Resp 16 | Ht 66.0 in | Wt 185.9 lb

## 2017-04-28 DIAGNOSIS — R609 Edema, unspecified: Secondary | ICD-10-CM | POA: Insufficient documentation

## 2017-04-28 DIAGNOSIS — R251 Tremor, unspecified: Secondary | ICD-10-CM

## 2017-04-28 DIAGNOSIS — C711 Malignant neoplasm of frontal lobe: Secondary | ICD-10-CM

## 2017-04-28 DIAGNOSIS — C719 Malignant neoplasm of brain, unspecified: Secondary | ICD-10-CM

## 2017-04-28 DIAGNOSIS — R300 Dysuria: Secondary | ICD-10-CM

## 2017-04-28 DIAGNOSIS — D6959 Other secondary thrombocytopenia: Secondary | ICD-10-CM | POA: Diagnosis not present

## 2017-04-28 LAB — CBC & DIFF AND RETIC
BASO%: 0.2 % (ref 0.0–2.0)
Basophils Absolute: 0 10*3/uL (ref 0.0–0.1)
EOS%: 4.2 % (ref 0.0–7.0)
Eosinophils Absolute: 0.2 10*3/uL (ref 0.0–0.5)
HEMATOCRIT: 40.2 % (ref 34.8–46.6)
HGB: 13.4 g/dL (ref 11.6–15.9)
Immature Retic Fract: 6.7 % (ref 1.60–10.00)
LYMPH#: 0.5 10*3/uL — AB (ref 0.9–3.3)
LYMPH%: 9.6 % — ABNORMAL LOW (ref 14.0–49.7)
MCH: 30.2 pg (ref 25.1–34.0)
MCHC: 33.3 g/dL (ref 31.5–36.0)
MCV: 90.5 fL (ref 79.5–101.0)
MONO#: 0.4 10*3/uL (ref 0.1–0.9)
MONO%: 8.5 % (ref 0.0–14.0)
NEUT%: 77.5 % — ABNORMAL HIGH (ref 38.4–76.8)
NEUTROS ABS: 3.7 10*3/uL (ref 1.5–6.5)
PLATELETS: 64 10*3/uL — AB (ref 145–400)
RBC: 4.44 10*6/uL (ref 3.70–5.45)
RDW: 14.3 % (ref 11.2–14.5)
RETIC %: 1.24 % (ref 0.70–2.10)
RETIC CT ABS: 55.06 10*3/uL (ref 33.70–90.70)
WBC: 4.7 10*3/uL (ref 3.9–10.3)
nRBC: 0 % (ref 0–0)

## 2017-04-28 LAB — COMPREHENSIVE METABOLIC PANEL
ALT: 16 U/L (ref 0–55)
ANION GAP: 11 meq/L (ref 3–11)
AST: 19 U/L (ref 5–34)
Albumin: 3.1 g/dL — ABNORMAL LOW (ref 3.5–5.0)
Alkaline Phosphatase: 68 U/L (ref 40–150)
BUN: 17.4 mg/dL (ref 7.0–26.0)
CALCIUM: 9.5 mg/dL (ref 8.4–10.4)
CHLORIDE: 106 meq/L (ref 98–109)
CO2: 25 meq/L (ref 22–29)
CREATININE: 0.9 mg/dL (ref 0.6–1.1)
EGFR: 64 mL/min/{1.73_m2} — ABNORMAL LOW (ref >90)
Glucose: 84 mg/dl (ref 70–140)
POTASSIUM: 3.6 meq/L (ref 3.5–5.1)
Sodium: 142 mEq/L (ref 136–145)
Total Bilirubin: 0.38 mg/dL (ref 0.20–1.20)
Total Protein: 7.2 g/dL (ref 6.4–8.3)

## 2017-04-28 LAB — URINALYSIS, MICROSCOPIC - CHCC
Bilirubin (Urine): NEGATIVE
GLUCOSE UR CHCC: NEGATIVE mg/dL
Ketones: NEGATIVE mg/dL
LEUKOCYTE ESTERASE: NEGATIVE
Nitrite: NEGATIVE
PH: 6 (ref 4.6–8.0)
PROTEIN: NEGATIVE mg/dL
Specific Gravity, Urine: 1.02 (ref 1.003–1.035)
UROBILINOGEN UR: 0.2 mg/dL (ref 0.2–1)

## 2017-04-28 MED ORDER — GADOBENATE DIMEGLUMINE 529 MG/ML IV SOLN
18.0000 mL | Freq: Once | INTRAVENOUS | Status: AC | PRN
  Administered 2017-04-28: 18 mL via INTRAVENOUS

## 2017-04-28 NOTE — Telephone Encounter (Signed)
Gave patient avs report and appointments for July and August.  °

## 2017-04-28 NOTE — Progress Notes (Signed)
LVM with pt regarding urinalysis collected today.  Per Dr. Irene Limbo, no abx needed.  Pt instructed to increase fluid intake and inform clinic if symptoms worsen.  Call back number provided.

## 2017-04-28 NOTE — Patient Instructions (Addendum)
Patient Instructions  -Hold Temodar at this time -we shall rpt labs in 7-10 days and decide further chemotherapy accordingly -MRI brain today -f/u with [facility] as per appointment on Thursday -continue f/u with Dr Posey Pronto to optimize treatment of essential tremors.   Thank you for choosing [place] to provide your oncology and hematology care.  To afford each patient quality time with our providers, please arrive 30 minutes before your scheduled appointment time.  If you arrive late for your appointment, you may be asked to reschedule.  We strive to give you quality time with our providers, and arriving late affects you and other patients whose appointments are after yours.   If you are a no show for multiple scheduled visits, you may be dismissed from the clinic at the providers discretion.    Again, thank you for choosing [facility], our hope is that these requests will decrease the amount of time that you wait before being seen by our physicians.  ______________________________________________________________________  Should you have questions after your visit to the [facility], please contact our office at [phone] between the hours of 8:30 and 4:30 p.m.    Voicemails left after 4:30p.m will not be returned until the following business day.    For prescription refill requests, please have your pharmacy contact us directly.  Please also try to allow 48 hours for prescription requests.    Please contact the scheduling department for questions regarding scheduling.  For scheduling of procedures such as PET scans, CT scans, MRI, Ultrasound, etc please contact central scheduling at [phone].    Resources For Cancer Patients and Caregivers:   Oncolink.org:  A wonderful resource for patients and healthcare providers for information regarding your disease, ways to tract your treatment, what to expect, etc.     [place]:   930-566-3160  Can help patients locate various types of support and financial assistance  Cancer Care: 1-800-813-HOPE (770) 064-1649) Provides financial assistance, online support groups, medication/co-pay assistance.    [place]:  (773)434-0351 Where to apply for food stamps, Medicaid, and utility assistance  Medicare Rights Center: 910 852 8578 Helps people with Medicare understand their rights and benefits, navigate the Medicare system, and secure the quality healthcare they deserve  [employer]: [place] [employer]'s shared-ride transportation service for eligible riders who have a disability that prevents them from riding the fixed route bus.    For additional information on assistance programs please contact our social worker:   Sh[place]:  [phone]

## 2017-04-29 ENCOUNTER — Telehealth (HOSPITAL_COMMUNITY): Payer: Self-pay | Admitting: Physical Therapy

## 2017-04-29 ENCOUNTER — Ambulatory Visit (HOSPITAL_COMMUNITY): Payer: Medicare Other | Admitting: Occupational Therapy

## 2017-04-29 ENCOUNTER — Ambulatory Visit (HOSPITAL_COMMUNITY): Payer: Medicare Other | Admitting: Physical Therapy

## 2017-04-29 NOTE — Telephone Encounter (Signed)
She had blood work done and her palets are down and a MRI she is not able to come today. NF

## 2017-04-30 LAB — URINE CULTURE

## 2017-04-30 NOTE — Progress Notes (Signed)
HEMATOLOGY/ONCOLOGY CLINIC NOTE  Date of Service: .04/28/2017  Patient Care Team: Sharilyn Sites, MD as PCP - General (Family Medicine) Ledon Snare MD (Radiation Oncology) Ashok Pall MD (Neurosurgery)  CHIEF COMPLAINTS/PURPOSE OF CONSULTATION:  f/u GBM  HISTORY OF PRESENTING ILLNESS:   Donna Valentine is a wonderful 76 y.o. female who has been referred to [place] by Dr Ashok Pall, MD  for evaluation and management of newly diagnosed Glioblastoma Multiforme.  Patient has a h/o HLD, PAC's and borderline HTN presented to the ED on 11/20/2016 with sudden onset of left arm and some leg weakness and on CT head R frontoparietal lesion of 2.2 cm w/ vasogenic edema which was further evaluated with MRI. 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 No findings concerning for bleed or infarct.  Patient was treated with high dose steroids and keppra for SZ prophylaxis. Patient subsequently had an uncomplicated right frontal stereotactic craniotomy for tumor resection of the dominant rt frontoparietal mass but not all the satellite lesions (2 were apparently clinically evident as per surgery notes). She has had improvement in her left sided strength since her surgery and he cranial wound has been dry and clean. She is ambulating with walker and eating well. She has been setup for home Physical therapy.  I met with the patient as she was readied to be discharged home. She was seen with several family members at bedside. We discussed the diagnosis , prognosis and standard of care treatment options available and option to go to [facility] for consideration of possible clinical trials. NCCN guidelines were provided and discussed.  Patient has not been evaluated by radiation oncology yet.  Patient notes that she did not have any significant health limitation prior to the  diagnosis of her GBM.  INTERVAL HISTORY  Patient is here with several of her family members for follow-up of her GBM prior to her 3rd cycle of adjuvant temodar. She notes that her primidone dose has been increased to 50 mg once daily for essential tremors. Dexamethasone dose has been reduced to 2 mg every other day and is being treated by her [facility] oncology team    She received temodar @ 200ng/m2 in C2 and was recommended not to start the next cycle of treatment and she had repeated labs today in clinic. She started chemotherapy and are for the last 2 days prior to labs and was noted today to have thrombocytopenic with platelet count of 64k and her Temodar was held. We discussed that we would have to monitor platelets and make sure they are uptrending prior to resuming her temodar. She is scheduled for MRI of the brain later today and has a follow-up at [facility] on Thursday.  MEDICAL HISTORY:      Past Medical History:  Diagnosis Date  . Cancer ([place])   GBM  SURGICAL HISTORY:      Past Surgical History:  Procedure Laterality Date  . APPENDECTOMY  1994  . APPLICATION OF CRANIAL NAVIGATION N/A 11/28/2016   Procedure: APPLICATION OF CRANIAL NAVIGATION;  Surgeon: Ashok Pall, MD;  Location: [place];  Service: Neurosurgery;  Laterality: N/A;  . CESAREAN SECTION     x3  . CRANIOTOMY Right 11/28/2016   Procedure: STERIOTACTIC PARIETAL CRANIOTOMY FOR RIGHT FRONTAL TUMOR RESECTION WITH BRAINLAB;  Surgeon: Ashok Pall, MD;  Location: [place];  Service: Neurosurgery;  Laterality: Right;    SOCIAL HISTORY: Social  History        Social History  . Marital status: Married    Spouse name: N/A  . Number of children: N/A  . Years of education: N/A      Occupational History  . Not on file.        Social History Main Topics  . Smoking status: Former Smoker    Quit date: 11/26/1991  . Smokeless tobacco: Never Used  . Alcohol use No  . Drug use: No  . Sexual activity: Not on  file       Other Topics Concern  . Not on file      Social History Narrative  . No narrative on file    FAMILY HISTORY: History reviewed. No pertinent family history.  ALLERGIES:  is allergic to penicillins and food.  MEDICATIONS: . Current Outpatient Prescriptions on File Prior to Visit  Medication Sig Dispense Refill  . dexamethasone (DECADRON) 4 MG tablet Take by mouth.    . fluconazole (DIFLUCAN) 100 MG tablet Take 1 tablet (100 mg total) by mouth daily. 7 tablet 0  . levETIRAcetam (KEPPRA) 500 MG tablet Take 1 tablet (500 mg total) by mouth 2 (two) times daily. 60 tablet 5  . nystatin (MYCOSTATIN/NYSTOP) powder Apply topically 3 (three) times daily. Apply powder under the breast skin fold over the rash 30 g 1  . ondansetron (ZOFRAN) 8 MG tablet Take 1 tablet (8 mg total) by mouth every 8 (eight) hours as needed for nausea. 30 tablet 3  . primidone (MYSOLINE) 50 MG tablet Take 1 tablet (50 mg total) by mouth at bedtime. 30 tablet 3  . temozolomide (TEMODAR) 100 MG capsule TAKE 4 CAPSULES ('400MG'$ ) BY MOUTH DAILY FOR 5 DAYS EVERY 28 DAYS 20 capsule 0   No current facility-administered medications on file prior to visit.       REVIEW OF SYSTEMS:    10 Point review of Systems was done is negative except as noted above.  PHYSICAL EXAMINATION: ECOG PERFORMANCE STATUS: 1 - Symptomatic but completely ambulatory .BP 138/68 (BP Location: Left Arm, Patient Position: Sitting)   Pulse 72   Temp 98 F (36.7 C) (Oral)   Resp 16   Ht '5\' 6"'$  (1.676 m)   Wt 185 lb 14.4 oz (84.3 kg)   SpO2 98%   BMI 30.01 kg/m   GENERAL:alert, in no acute distress and comfortable SKIN: skin color, texture, turgor are normal, no rashes or significant lesions EYES: normal, conjunctiva are pink and non-injected, sclera clear OROPHARYNX:no exudate, no erythema and lips, buccal mucosa, and tongue normal  NECK: supple, no JVD, thyroid normal size, non-tender, without nodularity LYMPH:  no  palpable lymphadenopathy in the cervical, axillary or inguinal LUNGS: clear to auscultation with normal respiratory effort HEART: regular rate & rhythm,  no murmurs and no lower extremity edema ABDOMEN: abdomen soft, non-tender, normoactive bowel sounds  Musculoskeletal: no cyanosis of digits and no clubbing  PSYCH: alert & oriented x 3 with fluent speech NEURO:4/5 LUE strength, 4+/5 LLE strength, 5/5 on rt side UE and LE  LABORATORY DATA:  I have reviewed the data as listed  . CBC Latest Ref Rng & Units 04/28/2017 03/25/2017 03/12/2017  WBC 3.9 - 10.3 10e3/uL 4.7 5.0 7.3  Hemoglobin 11.6 - 15.9 g/dL 13.4 14.4 14.1  Hematocrit 34.8 - 46.6 % 40.2 43.8 42.8  Platelets 145 - 400 10e3/uL 64(L) 113(L) 127(L)   . CMP Latest Ref Rng & Units 04/28/2017 03/25/2017 03/12/2017  Glucose 70 - 140 mg/dl  84 84 176(H)  BUN 7.0 - 26.0 mg/dL 17.4 12.9 14.0  Creatinine 0.6 - 1.1 mg/dL 0.9 0.9 0.8  Sodium 136 - 145 mEq/L 142 143 142  Potassium 3.5 - 5.1 mEq/L 3.6 3.8 4.0  Chloride 101 - 111 mmol/L - - -  CO2 22 - 29 mEq/L '25 28 24  '$ Calcium 8.4 - 10.4 mg/dL 9.5 9.4 9.6  Total Protein 6.4 - 8.3 g/dL 7.2 6.9 7.1  Total Bilirubin 0.20 - 1.20 mg/dL 0.38 0.56 0.54  Alkaline Phos 40 - 150 U/L 68 75 79  AST 5 - 34 U/L '19 21 28  '$ ALT 0 - 55 U/L 16 24 53         RADIOGRAPHIC STUDIES: I have personally reviewed the radiological images as listed and agreed with the findings in the report.  .Mr Jeri Cos Wo Contrast  Result Date: 04/29/2017 CLINICAL DATA:  Right frontal lobe glioblastoma status post resection followed by concurrent chemoradiation completed on 02/06/2017. Ongoing Temodar therapy. EXAM: MRI HEAD WITHOUT AND WITH CONTRAST TECHNIQUE: Multiplanar, multiecho pulse sequences of the brain and surrounding structures were obtained without and with intravenous contrast. CONTRAST:  6m MULTIHANCE GADOBENATE DIMEGLUMINE 529 MG/ML IV SOLN COMPARISON:  02/13/2017 FINDINGS: Brain: Sequelae of right  frontoparietal craniotomy are again identified. A small amount of chronic blood products are again seen in the underlying resection site. A very small extra-axial fluid collection is again seen subjacent to the craniotomy. Dural enhancement is again seen at the craniotomy with increased size/ amount of thick, nodular enhancement in the posterior right frontal lobe now measuring 2.5 x 1.8 x 2.6 cm (previously 2.0 x 1.3 x 2.5 cm). No new separate foci of enhancement are identified. There is extensive edema throughout the high right frontal lobe which has greatly increased from the prior study and mildly extends into the parietal lobe, operculum, and external capsule region. There is slight mass effect on the right lateral ventricle. There is no midline shift. Abnormal subarachnoid FLAIR signal within a few right frontoparietal sulci on the prior MRI is no longer apparent. Patchy to confluent T2 hyperintensities throughout the left cerebral hemispheric white matter and pons are stable to mildly increased and may reflect a combination of chronic small vessel ischemia and post treatment changes. A subcentimeter focus of mildly nodular cortical/ subcortical T2/FLAIR hyperintensity in the posteromedial left frontal lobe (series 6, image 27) is unchanged and without associated enhancement. Vascular: Major intracranial vascular flow voids are preserved. Skull and upper cervical spine: No suspicious marrow lesion. Sinuses/Orbits: Unremarkable orbits.  No significant sinus disease. Other: None. IMPRESSION: 1. Increasing nodular enhancement in the posterior right frontal lobe with greatly increased vasogenic edema, concerning for progressive tumor although progressive post treatment changes are also possible. 2. Resolved subarachnoid FLAIR signal abnormality. Electronically Signed   By: ALogan BoresM.D.   On: 04/29/2017 08:51   ASSESSMENT & PLAN:   76yo previously healthy caucasian female with no significant Chronic  medical co-mrobidities with   1) Newly diagnosed Rt fronto-parietal GBM with a few satellite lesions. 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 (Neurosurgery - Dr CChristella Noa. Patient has completed concurrent chemo-radiation on 02/06/2017 and tolerated it well. Blood counts stable -MRI brain results from 02/13/2017 were discussed in details -- some increased enhancement at the site of treatment is thought to represent treatment effect presenting as pseudo-progression.  Patient has completed 2 cycles of adjuvant Temodar.  2) Thrombocytopenia related to temodar [facility] -Patient has no clinical evidence of GBM progression at this time -MRI brain done after clinic today showed some increase in enhancing nodularity in the posterior right frontal lobe with greatly increased vasogenic edema, concerning for progressive tumor although progressive post treatment changes are also possible.  -She is down to dexamethasone 2 mg every other day as per [facility] and this might need to be bumped up to help with the vasogenic edema. -She will need to have her MRI reviewed at [facility] to determine if this is pseudo-progression versus more likely versus true progression. -Consideration of adding Avastin to reduce vasogenic edema and improve tumor response if needed. -She was recommended not to start her third cycle of Temodar until she had had labs today but started 2 days ago. -She has been recommended to hold Temodar and get weekly labs to monitor her platelets. -If platelets are close at 100k we'll plan to start her third cycle of Temodar at '150mg'$ /m -on keppra for SZ prophylaxis. -is to be seen at [facility] on 7/19 - will look out for their recommendation as well  3) b/l upper extremity tremors -- it appears patient had some essential tremors even prior to GBM diagnosis but now more prominent and bothersome. ?Worsened by  anxiety and treatment. Thyroid function tests WNL Plan -improved on Primidone started by Dr Posey Pronto. Nw on Primidone [age]-year-old treated mg po daily. -ativan prn  4) Abnormal LFts - likely from temodar. Resolved  Labs in 7 days RTC with Dr Irene Limbo in 3 weeks with labs  -All of the patients questions were answered with apparent satisfaction. The patient knows to call the clinic with any problems, questions or concerns.  I spent 20 minutes counseling the patient face to face. The total time spent in the appointment was 25 minutes and more than 50% was on counseling and direct patient cares    Sullivan Lone MD [place] AAHIVMS [facility] Hematology/Oncology Physician [facility]  (Office):       [phone] (Work cell):  [phone] (Fax):           [phone]

## 2017-05-05 ENCOUNTER — Ambulatory Visit (HOSPITAL_COMMUNITY): Payer: Medicare Other | Admitting: Physical Therapy

## 2017-05-05 ENCOUNTER — Other Ambulatory Visit (HOSPITAL_BASED_OUTPATIENT_CLINIC_OR_DEPARTMENT_OTHER): Payer: Medicare Other

## 2017-05-05 ENCOUNTER — Ambulatory Visit (HOSPITAL_COMMUNITY): Payer: Medicare Other | Admitting: Occupational Therapy

## 2017-05-05 DIAGNOSIS — C711 Malignant neoplasm of frontal lobe: Secondary | ICD-10-CM

## 2017-05-05 LAB — CBC & DIFF AND RETIC
BASO%: 0.3 % (ref 0.0–2.0)
BASOS ABS: 0 10*3/uL (ref 0.0–0.1)
EOS ABS: 0.2 10*3/uL (ref 0.0–0.5)
EOS%: 4.8 % (ref 0.0–7.0)
HEMATOCRIT: 39.5 % (ref 34.8–46.6)
HEMOGLOBIN: 13.2 g/dL (ref 11.6–15.9)
Immature Retic Fract: 7.9 % (ref 1.60–10.00)
LYMPH#: 0.7 10*3/uL — AB (ref 0.9–3.3)
LYMPH%: 18.5 % (ref 14.0–49.7)
MCH: 30.2 pg (ref 25.1–34.0)
MCHC: 33.4 g/dL (ref 31.5–36.0)
MCV: 90.4 fL (ref 79.5–101.0)
MONO#: 0.5 10*3/uL (ref 0.1–0.9)
MONO%: 13.2 % (ref 0.0–14.0)
NEUT%: 63.2 % (ref 38.4–76.8)
NEUTROS ABS: 2.4 10*3/uL (ref 1.5–6.5)
Platelets: 99 10*3/uL — ABNORMAL LOW (ref 145–400)
RBC: 4.37 10*6/uL (ref 3.70–5.45)
RDW: 14.5 % (ref 11.2–14.5)
RETIC %: 1.38 % (ref 0.70–2.10)
RETIC CT ABS: 60.31 10*3/uL (ref 33.70–90.70)
WBC: 3.8 10*3/uL — AB (ref 3.9–10.3)

## 2017-05-07 ENCOUNTER — Ambulatory Visit (HOSPITAL_COMMUNITY): Payer: Medicare Other | Admitting: Physical Therapy

## 2017-05-07 ENCOUNTER — Other Ambulatory Visit: Payer: Self-pay | Admitting: Hematology

## 2017-05-07 ENCOUNTER — Ambulatory Visit (HOSPITAL_COMMUNITY): Payer: Medicare Other

## 2017-05-07 DIAGNOSIS — R278 Other lack of coordination: Secondary | ICD-10-CM

## 2017-05-07 DIAGNOSIS — M6281 Muscle weakness (generalized): Secondary | ICD-10-CM

## 2017-05-07 DIAGNOSIS — R2681 Unsteadiness on feet: Secondary | ICD-10-CM

## 2017-05-07 NOTE — Therapy (Signed)
[place], [place], [address] Phone: [phone]   Fax:  [phone]  Physical Therapy Treatment/Discharge  Patient Details  Name: Donna Valentine MRN: [id number] Date of Birth: 04/17/1941 Referring Provider: Ashok Pall, MD   Encounter Date: 05/07/2017      PT End of Session - 05/07/17 1421    Visit Number 24   Number of Visits 25   Date for PT Re-Evaluation 05/07/17   Authorization Type [employer] Medicare (G-codes and KX); G-codes done 01/09/2023 session    Authorization Time Period recert completed 7/61; 9/5-0/93; recert done 7/5   Authorization - Visit Number 24   Authorization - Number of Visits 32   PT Start Time 1350   PT Stop Time 1430  10 min not included in billing   PT Time Calculation (min) 40 min   Equipment Utilized During Treatment Gait belt   Activity Tolerance Patient tolerated treatment well;Patient limited by fatigue   Behavior During Therapy [facility] for tasks assessed/performed      Past Medical History:  Diagnosis Date  . GBM (glioblastoma multiforme) ([place])     Past Surgical History:  Procedure Laterality Date  . APPENDECTOMY  1994  . APPLICATION OF CRANIAL NAVIGATION N/A 11/28/2016   Procedure: APPLICATION OF CRANIAL NAVIGATION;  Surgeon: Ashok Pall, MD;  Location: [place];  Service: Neurosurgery;  Laterality: N/A;  . CESAREAN SECTION     x3  . CRANIOTOMY Right 11/28/2016   Procedure: STERIOTACTIC PARIETAL CRANIOTOMY FOR RIGHT FRONTAL TUMOR RESECTION WITH BRAINLAB;  Surgeon: Ashok Pall, MD;  Location: [place];  Service: Neurosurgery;  Laterality: Right;    There were no vitals filed for this visit.      Subjective Assessment - 05/07/17 1352    Subjective Pt reports that things are going ok today. 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 Once she gets into the swing of things, she has to start back on another treatment. She has been working on some of her  exercises and got new tennis shoes so that she can start walking.    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 No/denies            [facility] PT Assessment - 05/07/17 0001      Assessment   Medical Diagnosis glioblastoma    Referring Provider Ashok Pall, MD    Onset Date/Surgical Date 11/20/16   Next MD Visit unsure     Precautions   Precautions Other (comment)   Precaution Comments brain tumor, hx craniotomy, on active chemo       Prior Function   Level of Independence Independent   Vocation Retired   Leisure shopping, grandkids, church activities, eating out     Strength   Right Hip Flexion 3+/5   Right Hip ABduction 2+/5   Left Hip Flexion 3+/5   Left Hip ABduction 2+/5   Right Knee Flexion 4+/5   Right Knee Extension 5/5   Left Knee Flexion 4+/5   Left Knee Extension 5/5   Right Ankle Dorsiflexion 5/5   Left Ankle Dorsiflexion 5/5     6 minute walk test results    Aerobic Endurance Distance Walked 460   Endurance additional comments 3MWT      Dynamic Gait Index   Level Surface Normal   Change in Gait Speed Normal   Gait with  Horizontal Head Turns Moderate Impairment   Gait with Vertical Head Turns Mild Impairment   Gait and Pivot Turn Mild Impairment   Step Over Obstacle Mild Impairment   Step Around Obstacles Normal   Steps Mild Impairment   Total Score 18                     [facility] Adult PT Treatment/Exercise - 05/07/17 0001      Knee/Hip Exercises: Aerobic   Nustep Nustep, L2 between 50 SPM and 60 SPM                 PT Education - 05/07/17 1420    Education provided Yes   Education Details Discussed goals and plateau in progress; reviewed tips for home to decrease risk of falling; encouraged pt to continue with light exercise and walking program to maintain level of fitness    Person(s) Educated Patient    Methods Explanation   Comprehension Verbalized understanding          PT Short Term Goals - 05/07/17 1411      PT SHORT TERM GOAL #1   Title Patient to be able to identify 5/5 safety factors/concepts in order to demonstrate good safety and reduced fall risk    Baseline 7/5- 3/5   Time 2   Period Weeks   Status Partially Met     PT SHORT TERM GOAL #2   Title Patient to be able to ambulate over 786ft in in order to demonstrate improved general mobility and community access    Baseline 450 ft    Time 2   Period Weeks   Status Not Met     PT SHORT TERM GOAL #3   Title Patient to be independent in correctly and consistently performing targeted HEP, to be updated as appropriate    Baseline 7/5- has not been doing a lot of HEP due to fatigue    Time 1   Period Weeks   Status Achieved     PT SHORT TERM GOAL #4   Title Patient to be performing regular progressive walking program, at least 10-15 minutes in duration and 1-2 times per day, in order to assist in addressing fatigue and functional weakness    Baseline planning to start this now that she has her shoes    Time 3   Period Weeks   Status Achieved           PT Long Term Goals - 05/07/17 1413      PT LONG TERM GOAL #1   Title Patient to demonstrate functional strength 5/5 in all tested muscle groups in order to improve overall gait and balance    Time 4   Period Weeks   Status Partially Met     PT LONG TERM GOAL #2   Title Patient to score 24 on DGI in order to show improved dynamic balance skills and overall reduced fall risk    Baseline 05/07/17: 18/24    Time 4   Period Weeks   Status Not Met     PT LONG TERM GOAL #3   Title Patient to be participatory in regular aerobic exercise program, at least 20 minutes in duration and 4 days per week, in order to maintain functional gains and imrpove overall health status    Baseline discussed today. Pt planning to see how her appointment goes at [facility]    Time 4    Period Weeks   Status Not Met  Plan - 15-May-2017 1424    Clinical Impression Statement Pt was discharged this visit having demonstrated overall progress since the start of rehab, however therapist noting there has been a plateau in her progress since last reassessment. She has improved in several areas including LE strength and balance. She is reportedly trying to complete her HEP at home and is planning to begin a daily walking routine as well. Although pt's score on DGI places her at higher risk of falling, pt has verbalized changes made to her home environment to decrease her risk of tripping/falling. Currently, she is waiting for her appointment at [facility] to see what her upcoming treatment strategies will be. Pt's HEP has been updated and reviewed with her over several occasions and pt is in agreement with d/c to allow her to continue with these programs independently at home.    Rehab Potential Good   Clinical Impairments Affecting Rehab Potential (+) high PLOF, very active at baseline, good recovery after craniotomy; (-) may be starting radiation/chemo soon/effects of these treatments on tolerance to rehab    PT Frequency 1x / week   PT Duration 3 weeks   PT Treatment/Interventions ADLs/Self Care Home Management;Biofeedback;Gait training;Stair training;Functional mobility training;Therapeutic activities;Therapeutic exercise;Balance training;Neuromuscular re-education;Patient/family education;Manual techniques;Energy conservation;Taping   PT Next Visit Plan d/c home with HEP   PT Home Exercise Plan off days: Seated hip adduction squeezes x10, seated clamshells x10 with red TB, seated knee extension/flexion with red TB x10, seated or standing heel/toe raises x20   Consulted and Agree with Plan of Care Patient      Patient will benefit from skilled therapeutic intervention in order to improve the following deficits and impairments:  Abnormal gait, Decreased coordination,  Decreased mobility, Decreased activity tolerance, Decreased strength, Decreased balance, Difficulty walking  Visit Diagnosis: Other lack of coordination  Muscle weakness (generalized)  Unsteadiness on feet       G-Codes - May 15, 2017 1554    Functional Assessment Tool Used (Outpatient Only) Based on skilled clincial assessment of strenght, gait, dynamic balance, overall fall risk, functional activity tolerance    Functional Limitation Mobility: Walking and moving around   Mobility: Walking and Moving Around Goal Status [phone]) At least 1 percent but less than 20 percent impaired, limited or restricted   Mobility: Walking and Moving Around Discharge Status [phone]) At least 40 percent but less than 60 percent impaired, limited or restricted      Problem List Patient Active Problem List   Diagnosis Date Noted  . Oral candida 01/08/2017  . Goals of care, counseling/discussion   . Glioblastoma multiforme of frontal lobe ([place]) 12/01/2016  . Brain tumor ([place]) 11/28/2016  . Multifocal glioblastoma of the right frontal lobe of brain ([place]) 11/20/2016  . Left-sided weakness 11/20/2016  . 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 HYPERCHOLESTEROLEMIA, BORDERLINE 07/28/2008  . ANEMIA, MILD 07/28/2008  . DIVERTICULOSIS OF COLON 07/28/2008  . HEMATURIA, HX OF 07/28/2008  . COLONIC POLYPS 07/27/2008  . RIGHT BUNDLE BRANCH BLOCK 07/27/2008  . PREMATURE ATRIAL CONTRACTIONS 07/27/2008  . HEMORRHOIDS 07/27/2008  . VENOUS INSUFFICIENCY 07/27/2008  . BACK PAIN, LUMBAR 07/27/2008  . SCOLIOSIS 07/27/2008  . PALPITATIONS, HX OF 07/27/2008       [facility] [address] [place], [place], [address] Phone: [phone]   Fax:  [phone]  Name: Donna Valentine MRN: [id number] Date of Birth: 1941-09-22  PHYSICAL THERAPY DISCHARGE SUMMARY  Visits from Start of Care: 24  Current functional level related to goals /  functional outcomes: See  above for more details    Remaining deficits: See above for more details    Education / Equipment: See above for more details   Plan: Patient agrees to discharge.  Patient goals were partially met. Patient is being discharged due to lack of progress.  ?????    3:56 PM,05/07/17 Elly Modena PT, [facility] [phone]

## 2017-05-08 NOTE — Telephone Encounter (Signed)
Prescription renewal

## 2017-05-19 ENCOUNTER — Ambulatory Visit (HOSPITAL_BASED_OUTPATIENT_CLINIC_OR_DEPARTMENT_OTHER): Payer: Medicare Other | Admitting: Hematology

## 2017-05-19 ENCOUNTER — Other Ambulatory Visit (HOSPITAL_BASED_OUTPATIENT_CLINIC_OR_DEPARTMENT_OTHER): Payer: Medicare Other

## 2017-05-19 VITALS — BP 132/58 | HR 77 | Temp 98.8°F | Resp 18 | Ht 66.0 in | Wt 184.5 lb

## 2017-05-19 DIAGNOSIS — D696 Thrombocytopenia, unspecified: Secondary | ICD-10-CM

## 2017-05-19 DIAGNOSIS — C711 Malignant neoplasm of frontal lobe: Secondary | ICD-10-CM

## 2017-05-19 DIAGNOSIS — R251 Tremor, unspecified: Secondary | ICD-10-CM

## 2017-05-19 LAB — CBC & DIFF AND RETIC
BASO%: 0.4 % (ref 0.0–2.0)
Basophils Absolute: 0 10*3/uL (ref 0.0–0.1)
EOS%: 1.9 % (ref 0.0–7.0)
Eosinophils Absolute: 0.1 10*3/uL (ref 0.0–0.5)
HCT: 40.1 % (ref 34.8–46.6)
HGB: 13.3 g/dL (ref 11.6–15.9)
IMMATURE RETIC FRACT: 8 % (ref 1.60–10.00)
LYMPH%: 16.4 % (ref 14.0–49.7)
MCH: 30.4 pg (ref 25.1–34.0)
MCHC: 33.2 g/dL (ref 31.5–36.0)
MCV: 91.8 fL (ref 79.5–101.0)
MONO#: 0.7 10*3/uL (ref 0.1–0.9)
MONO%: 11.8 % (ref 0.0–14.0)
NEUT%: 69.5 % (ref 38.4–76.8)
NEUTROS ABS: 3.9 10*3/uL (ref 1.5–6.5)
PLATELETS: 135 10*3/uL — AB (ref 145–400)
RBC: 4.37 10*6/uL (ref 3.70–5.45)
RDW: 14.7 % — ABNORMAL HIGH (ref 11.2–14.5)
Retic %: 1.81 % (ref 0.70–2.10)
Retic Ct Abs: 79.1 10*3/uL (ref 33.70–90.70)
WBC: 5.7 10*3/uL (ref 3.9–10.3)
lymph#: 0.9 10*3/uL (ref 0.9–3.3)

## 2017-05-19 LAB — COMPREHENSIVE METABOLIC PANEL
ALBUMIN: 3 g/dL — AB (ref 3.5–5.0)
ALK PHOS: 78 U/L (ref 40–150)
ALT: 12 U/L (ref 0–55)
ANION GAP: 8 meq/L (ref 3–11)
AST: 15 U/L (ref 5–34)
BILIRUBIN TOTAL: 0.36 mg/dL (ref 0.20–1.20)
BUN: 18.4 mg/dL (ref 7.0–26.0)
CO2: 28 meq/L (ref 22–29)
Calcium: 9.4 mg/dL (ref 8.4–10.4)
Chloride: 107 mEq/L (ref 98–109)
Creatinine: 0.9 mg/dL (ref 0.6–1.1)
EGFR: 66 mL/min/{1.73_m2} — AB (ref >90)
Glucose: 110 mg/dl (ref 70–140)
POTASSIUM: 3.8 meq/L (ref 3.5–5.1)
Sodium: 143 mEq/L (ref 136–145)
TOTAL PROTEIN: 7.2 g/dL (ref 6.4–8.3)

## 2017-05-19 NOTE — Progress Notes (Signed)
HEMATOLOGY/ONCOLOGY CLINIC NOTE  Date of Service: .05/19/2017  Patient Care Team: Sharilyn Sites, MD as PCP - General (Family Medicine) Ledon Snare MD (Radiation Oncology) Ashok Pall MD (Neurosurgery)  CHIEF COMPLAINTS/PURPOSE OF CONSULTATION:  f/u GBM  HISTORY OF PRESENTING ILLNESS:   Donna Valentine is a wonderful 76 y.o. female who has been referred to [place] by Dr Ashok Pall, MD  for evaluation and management of newly diagnosed Glioblastoma Multiforme.  Patient has a h/o HLD, PAC's and borderline HTN presented to the ED on 11/20/2016 with sudden onset of left arm and some leg weakness and on CT head R frontoparietal lesion of 2.2 cm w/ vasogenic edema which was further evaluated with MRI. 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 No findings concerning for bleed or infarct.  Patient was treated with high dose steroids and keppra for SZ prophylaxis. Patient subsequently had an uncomplicated right frontal stereotactic craniotomy for tumor resection of the dominant rt frontoparietal mass but not all the satellite lesions (2 were apparently clinically evident as per surgery notes). She has had improvement in her left sided strength since her surgery and he cranial wound has been dry and clean. She is ambulating with walker and eating well. She has been setup for home Physical therapy.  I met with the patient as she was readied to be discharged home. She was seen with several family members at bedside. We discussed the diagnosis , prognosis and standard of care treatment options available and option to go to [facility] for consideration of possible clinical trials. NCCN guidelines were provided and discussed.  Patient has not been evaluated by radiation oncology yet.  Patient notes that she did not have any significant health limitation prior to the  diagnosis of her GBM.  INTERVAL HISTORY  Patient is here with several of her family members for follow-up of her GBM . Last MRI from 7/17 showed Increasing nodular enhancement in the posterior right frontal lobe with greatly increased vasogenic edema, concerning for progressive tumor although progressive post treatment changes are also possible.  Patient was evaluated at [facility] and was recommended to hold further Temodar. She was told that there might be some mild progression and they're considering further clinical trials.  She continues to be on dexamethasone 2 mg every other day and is being treated by her [facility] oncology team  Thrombocytopenia as resolving off the Temodar. Her platelets are progressively up from 64k to 135k  No other acute new concerns.  MEDICAL HISTORY:      Past Medical History:  Diagnosis Date  . Cancer ([place])   GBM  SURGICAL HISTORY:      Past Surgical History:  Procedure Laterality Date  . APPENDECTOMY  1994  . APPLICATION OF CRANIAL NAVIGATION N/A 11/28/2016   Procedure: APPLICATION OF CRANIAL NAVIGATION;  Surgeon: Ashok Pall, MD;  Location: [place];  Service: Neurosurgery;  Laterality: N/A;  . CESAREAN SECTION     x3  . CRANIOTOMY Right 11/28/2016   Procedure: STERIOTACTIC PARIETAL CRANIOTOMY FOR RIGHT FRONTAL TUMOR RESECTION WITH BRAINLAB;  Surgeon: Ashok Pall, MD;  Location: [place];  Service: Neurosurgery;  Laterality: Right;    SOCIAL HISTORY: Social History        Social History  . Marital status: Married    Spouse name: N/A  . Number of children: N/A  . Years of education: N/A  Occupational History  . Not on file.        Social History Main Topics  . Smoking status: Former Smoker    Quit date: 11/26/1991  . Smokeless tobacco: Never Used  . Alcohol use No  . Drug use: No  . Sexual activity: Not on file       Other Topics Concern  . Not on file      Social History Narrative  . No narrative on file     FAMILY HISTORY: History reviewed. No pertinent family history.  ALLERGIES:  is allergic to penicillins and food.  MEDICATIONS: . Current Outpatient Prescriptions on File Prior to Visit  Medication Sig Dispense Refill  . dexamethasone (DECADRON) 4 MG tablet Take by mouth.    . fluconazole (DIFLUCAN) 100 MG tablet Take 1 tablet (100 mg total) by mouth daily. 7 tablet 0  . levETIRAcetam (KEPPRA) 500 MG tablet Take 1 tablet (500 mg total) by mouth 2 (two) times daily. 60 tablet 5  . nystatin (MYCOSTATIN/NYSTOP) powder Apply topically 3 (three) times daily. Apply powder under the breast skin fold over the rash 30 g 1  . ondansetron (ZOFRAN) 8 MG tablet Take 1 tablet (8 mg total) by mouth every 8 (eight) hours as needed for nausea. 30 tablet 3  . primidone (MYSOLINE) 50 MG tablet Take 1 tablet (50 mg total) by mouth at bedtime. 30 tablet 3  . temozolomide (TEMODAR) 100 MG capsule TAKE 4 CAPSULES BY MOUTH EVERY DAY FOR 5 DAYS EVERY 28 DAYS 20 capsule 0   No current facility-administered medications on file prior to visit.       REVIEW OF SYSTEMS:    10 Point review of Systems was done is negative except as noted above.  PHYSICAL EXAMINATION: ECOG PERFORMANCE STATUS: 1 - Symptomatic but completely ambulatory .BP (!) 132/58 (BP Location: Left Arm, Patient Position: Sitting)   Pulse 77   Temp 98.8 F (37.1 C) (Oral)   Resp 18   Ht '5\' 6"'$  (1.676 m)   Wt 184 lb 8 oz (83.7 kg)   SpO2 100%   BMI 29.78 kg/m   GENERAL:alert, in no acute distress and comfortable SKIN: skin color, texture, turgor are normal, no rashes or significant lesions EYES: normal, conjunctiva are pink and non-injected, sclera clear OROPHARYNX:no exudate, no erythema and lips, buccal mucosa, and tongue normal  NECK: supple, no JVD, thyroid normal size, non-tender, without nodularity LYMPH:  no palpable lymphadenopathy in the cervical, axillary or inguinal LUNGS: clear to auscultation with normal  respiratory effort HEART: regular rate & rhythm,  no murmurs and no lower extremity edema ABDOMEN: abdomen soft, non-tender, normoactive bowel sounds  Musculoskeletal: no cyanosis of digits and no clubbing  PSYCH: alert & oriented x 3 with fluent speech NEURO:4/5 LUE strength, 4+/5 LLE strength, 5/5 on rt side UE and LE  LABORATORY DATA:  I have reviewed the data as listed  . CBC Latest Ref Rng & Units 05/19/2017 05/05/2017 04/28/2017  WBC 3.9 - 10.3 10e3/uL 5.7 3.8(L) 4.7  Hemoglobin 11.6 - 15.9 g/dL 13.3 13.2 13.4  Hematocrit 34.8 - 46.6 % 40.1 39.5 40.2  Platelets 145 - 400 10e3/uL 135(L) 99(L) 64(L)   . CMP Latest Ref Rng & Units 04/28/2017 03/25/2017 03/12/2017  Glucose 70 - 140 mg/dl 84 84 176(H)  BUN 7.0 - 26.0 mg/dL 17.4 12.9 14.0  Creatinine 0.6 - 1.1 mg/dL 0.9 0.9 0.8  Sodium 136 - 145 mEq/L 142 143 142  Potassium 3.5 - 5.1 mEq/L 3.6  3.8 4.0  Chloride 101 - 111 mmol/L - - -  CO2 22 - 29 mEq/L '25 28 24  '$ Calcium 8.4 - 10.4 mg/dL 9.5 9.4 9.6  Total Protein 6.4 - 8.3 g/dL 7.2 6.9 7.1  Total Bilirubin 0.20 - 1.20 mg/dL 0.38 0.56 0.54  Alkaline Phos 40 - 150 U/L 68 75 79  AST 5 - 34 U/L '19 21 28  '$ ALT 0 - 55 U/L 16 24 53         RADIOGRAPHIC STUDIES: I have personally reviewed the radiological images as listed and agreed with the findings in the report.  .Mr Jeri Cos Wo Contrast  Result Date: 04/29/2017 CLINICAL DATA:  Right frontal lobe glioblastoma status post resection followed by concurrent chemoradiation completed on 02/06/2017. Ongoing Temodar therapy. EXAM: MRI HEAD WITHOUT AND WITH CONTRAST TECHNIQUE: Multiplanar, multiecho pulse sequences of the brain and surrounding structures were obtained without and with intravenous contrast. CONTRAST:  53m MULTIHANCE GADOBENATE DIMEGLUMINE 529 MG/ML IV SOLN COMPARISON:  02/13/2017 FINDINGS: Brain: Sequelae of right frontoparietal craniotomy are again identified. A small amount of chronic blood products are again seen in the  underlying resection site. A very small extra-axial fluid collection is again seen subjacent to the craniotomy. Dural enhancement is again seen at the craniotomy with increased size/ amount of thick, nodular enhancement in the posterior right frontal lobe now measuring 2.5 x 1.8 x 2.6 cm (previously 2.0 x 1.3 x 2.5 cm). No new separate foci of enhancement are identified. There is extensive edema throughout the high right frontal lobe which has greatly increased from the prior study and mildly extends into the parietal lobe, operculum, and external capsule region. There is slight mass effect on the right lateral ventricle. There is no midline shift. Abnormal subarachnoid FLAIR signal within a few right frontoparietal sulci on the prior MRI is no longer apparent. Patchy to confluent T2 hyperintensities throughout the left cerebral hemispheric white matter and pons are stable to mildly increased and may reflect a combination of chronic small vessel ischemia and post treatment changes. A subcentimeter focus of mildly nodular cortical/ subcortical T2/FLAIR hyperintensity in the posteromedial left frontal lobe (series 6, image 27) is unchanged and without associated enhancement. Vascular: Major intracranial vascular flow voids are preserved. Skull and upper cervical spine: No suspicious marrow lesion. Sinuses/Orbits: Unremarkable orbits.  No significant sinus disease. Other: None. IMPRESSION: 1. Increasing nodular enhancement in the posterior right frontal lobe with greatly increased vasogenic edema, concerning for progressive tumor although progressive post treatment changes are also possible. 2. Resolved subarachnoid FLAIR signal abnormality. Electronically Signed   By: ALogan BoresM.D.   On: 04/29/2017 08:51   ASSESSMENT & PLAN:   76yo previously healthy caucasian female with no significant Chronic medical co-mrobidities with   1) Newly diagnosed Rt fronto-parietal GBM with a few satellite lesions. 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 (Neurosurgery - Dr CChristella Noa. Patient has completed concurrent chemo-radiation on 02/06/2017 and tolerated it well. Blood counts stable -MRI brain results from 02/13/2017 were discussed in details -- some increased enhancement at the site of treatment is thought to represent treatment effect presenting as pseudo-progression. -MRI brain 04/28/2017 - Increasing nodular enhancement in the posterior right frontal lobe with greatly increased vasogenic edema, concerning for progressive tumor although progressive post treatment changes are also possible.  Patient has completed 2 cycles of adjuvant Temodar. 3rd cycle only received 2 days (held  for thrombocytopenia) Patient was seen and [facility] in follow-up after her last MRI and was thought to have possible disease progression. They recommended repeat MRI in 4 weeks and are evaluating the patient for possible clinical trials.  2) Thrombocytopenia related to temodar PLT at 64k -now improved to 135k. Plan  - I have messaged Dr Mickeal Skinner our new neuro oncologist to determine if he would be interested in taking over the care of this wonderful patient -We shall hold Temodar at this point, -Repeat MRI of the brain and a 3 Tesla machine on 05/29/2017. -Has a follow-up at [facility] and 06/04/2017 due to the MRI and consider patient for further clinical trials. -Continue dexamethasone 2 mg every other day as per [facility] and this might need to be bumped up to help with the vasogenic edema-we'll defer this to [facility] in the options of new focal symptoms at this time. -Consideration of adding Avastin to reduce vasogenic edema and improve tumor response if needed if no other clinical trials offered at [facility]. -on keppra for SZ prophylaxis.  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 ?Worsened by anxiety and treatment. Thyroid function tests WNL Plan -improved on Primidone started by Dr Posey Pronto. Nw on Primidone [age]-year-old treated mg po daily. -ativan prn  4) Abnormal LFts - likely from temodar. Resolved  MRI Brain on 05/29/2017 RTC with Dr Irene Limbo in 4 weeks with labs  -All of the patients questions were answered with apparent satisfaction. The patient knows to call the clinic with any problems, questions or concerns.  I spent 20 minutes counseling the patient face to face. The total time spent in the appointment was 25 minutes and more than 50% was on counseling and direct patient cares    Sullivan Lone MD [place] AAHIVMS [facility] Hematology/Oncology Physician [facility]  (Office):       [phone] (Work cell):  [phone] (Fax):           [phone]

## 2017-05-21 ENCOUNTER — Telehealth: Payer: Self-pay | Admitting: Neurology

## 2017-05-21 NOTE — Telephone Encounter (Signed)
Patient's daughter called and she said that her mom had went to see her Oncologist and he noticed that she was shaking and the daughter said it seems to be more. He was asking could the medication be increased for her shaking? Please Advise. Thanks

## 2017-05-21 NOTE — Telephone Encounter (Signed)
She can go to 50 mg bid but she needs a follow up appointment made as well.

## 2017-05-25 MED ORDER — PRIMIDONE 50 MG PO TABS: 50.0000 mg | ORAL_TABLET | Freq: Two times a day (BID) | ORAL | 2 refills | Status: DC

## 2017-05-25 NOTE — Telephone Encounter (Signed)
Spoke with patient's daughter and made her aware.  RX sent to pharmacy. They will have to call back to schedule- but aware they will need to call soon b/c Dr. Carles Collet is booked out til October. She expressed understanding.

## 2017-05-26 ENCOUNTER — Telehealth: Payer: Self-pay

## 2017-05-26 NOTE — Telephone Encounter (Signed)
Daughter called asking why MRI brain not scheduled yet. PA needs to be done. inbasket sent to [facility] [employer] (b/c Darlena out of office) cc to pod 4.

## 2017-05-28 ENCOUNTER — Telehealth: Payer: Self-pay

## 2017-05-28 NOTE — Telephone Encounter (Signed)
Left voice message with upcoming appointment for 9/4.

## 2017-05-28 NOTE — Telephone Encounter (Signed)
Spoke with [facility] representative and cancelled future communication regarding temozolomide because pt is no longer being treated with this therapy per Dr. Irene Limbo.

## 2017-06-02 ENCOUNTER — Ambulatory Visit (HOSPITAL_COMMUNITY)
Admission: RE | Admit: 2017-06-02 | Discharge: 2017-06-02 | Disposition: A | Discharge status: Home / Self Care | Payer: Medicare Other | Source: Ambulatory Visit | Attending: Hematology | Admitting: Hematology

## 2017-06-02 DIAGNOSIS — Z923 Personal history of irradiation: Secondary | ICD-10-CM | POA: Insufficient documentation

## 2017-06-02 DIAGNOSIS — Z9889 Other specified postprocedural states: Secondary | ICD-10-CM | POA: Diagnosis not present

## 2017-06-02 DIAGNOSIS — Z9221 Personal history of antineoplastic chemotherapy: Secondary | ICD-10-CM | POA: Insufficient documentation

## 2017-06-02 DIAGNOSIS — C711 Malignant neoplasm of frontal lobe: Secondary | ICD-10-CM | POA: Insufficient documentation

## 2017-06-02 MED ORDER — GADOBENATE DIMEGLUMINE 529 MG/ML IV SOLN
18.0000 mL | Freq: Once | INTRAVENOUS | Status: AC | PRN
  Administered 2017-06-02: 18 mL via INTRAVENOUS

## 2017-06-16 ENCOUNTER — Telehealth: Payer: Self-pay | Admitting: Internal Medicine

## 2017-06-16 ENCOUNTER — Telehealth: Payer: Self-pay | Admitting: *Deleted

## 2017-06-16 ENCOUNTER — Ambulatory Visit (HOSPITAL_BASED_OUTPATIENT_CLINIC_OR_DEPARTMENT_OTHER): Payer: Medicare Other | Admitting: Hematology

## 2017-06-16 ENCOUNTER — Telehealth: Payer: Self-pay | Admitting: Hematology

## 2017-06-16 ENCOUNTER — Telehealth: Payer: Self-pay

## 2017-06-16 ENCOUNTER — Encounter: Payer: Self-pay | Admitting: Hematology

## 2017-06-16 ENCOUNTER — Other Ambulatory Visit (HOSPITAL_BASED_OUTPATIENT_CLINIC_OR_DEPARTMENT_OTHER): Payer: Medicare Other

## 2017-06-16 VITALS — BP 139/63 | HR 65 | Temp 97.7°F | Resp 17 | Ht 66.0 in | Wt 187.4 lb

## 2017-06-16 DIAGNOSIS — C719 Malignant neoplasm of brain, unspecified: Secondary | ICD-10-CM

## 2017-06-16 DIAGNOSIS — B372 Candidiasis of skin and nail: Secondary | ICD-10-CM

## 2017-06-16 DIAGNOSIS — C711 Malignant neoplasm of frontal lobe: Secondary | ICD-10-CM

## 2017-06-16 DIAGNOSIS — D696 Thrombocytopenia, unspecified: Secondary | ICD-10-CM | POA: Diagnosis not present

## 2017-06-16 DIAGNOSIS — R251 Tremor, unspecified: Secondary | ICD-10-CM | POA: Diagnosis not present

## 2017-06-16 LAB — CBC & DIFF AND RETIC
BASO%: 0.3 % (ref 0.0–2.0)
Basophils Absolute: 0 10*3/uL (ref 0.0–0.1)
EOS ABS: 0.1 10*3/uL (ref 0.0–0.5)
EOS%: 1.3 % (ref 0.0–7.0)
HCT: 40.8 % (ref 34.8–46.6)
HEMOGLOBIN: 13.3 g/dL (ref 11.6–15.9)
IMMATURE RETIC FRACT: 6.7 % (ref 1.60–10.00)
LYMPH%: 20 % (ref 14.0–49.7)
MCH: 30.4 pg (ref 25.1–34.0)
MCHC: 32.6 g/dL (ref 31.5–36.0)
MCV: 93.2 fL (ref 79.5–101.0)
MONO#: 0.4 10*3/uL (ref 0.1–0.9)
MONO%: 7.2 % (ref 0.0–14.0)
NEUT%: 71.2 % (ref 38.4–76.8)
NEUTROS ABS: 4.2 10*3/uL (ref 1.5–6.5)
Platelets: 109 10*3/uL — ABNORMAL LOW (ref 145–400)
RBC: 4.38 10*6/uL (ref 3.70–5.45)
RDW: 14.8 % — ABNORMAL HIGH (ref 11.2–14.5)
RETIC %: 2.1 % (ref 0.70–2.10)
Retic Ct Abs: 91.98 10*3/uL — ABNORMAL HIGH (ref 33.70–90.70)
WBC: 6 10*3/uL (ref 3.9–10.3)
lymph#: 1.2 10*3/uL (ref 0.9–3.3)

## 2017-06-16 LAB — COMPREHENSIVE METABOLIC PANEL
ALBUMIN: 3 g/dL — AB (ref 3.5–5.0)
ALK PHOS: 74 U/L (ref 40–150)
ALT: 13 U/L (ref 0–55)
AST: 15 U/L (ref 5–34)
Anion Gap: 8 mEq/L (ref 3–11)
BUN: 16.1 mg/dL (ref 7.0–26.0)
CO2: 29 meq/L (ref 22–29)
Calcium: 9.3 mg/dL (ref 8.4–10.4)
Chloride: 106 mEq/L (ref 98–109)
Creatinine: 0.9 mg/dL (ref 0.6–1.1)
EGFR: 67 mL/min/{1.73_m2} — ABNORMAL LOW (ref >90)
GLUCOSE: 98 mg/dL (ref 70–140)
POTASSIUM: 3.5 meq/L (ref 3.5–5.1)
SODIUM: 143 meq/L (ref 136–145)
Total Bilirubin: 0.44 mg/dL (ref 0.20–1.20)
Total Protein: 6.8 g/dL (ref 6.4–8.3)

## 2017-06-16 MED ORDER — NYSTATIN 100000 UNIT/GM EX POWD: Freq: Three times a day (TID) | CUTANEOUS | 1 refills | Status: DC

## 2017-06-16 NOTE — Telephone Encounter (Signed)
If Dr. Mickeal Skinner is credentialed, Dr. Irene Limbo would like for him to begin following pt. Donna Iron, RN to pass message to Dr. Mickeal Skinner when he arrives.

## 2017-06-16 NOTE — Telephone Encounter (Signed)
Gave patient avs and calendar for 11/5

## 2017-06-16 NOTE — Telephone Encounter (Signed)
Confirmed transfer of care to Dr. Mickeal Skinner per inbasket communication.

## 2017-06-16 NOTE — Telephone Encounter (Signed)
At the request of Dr. Irene Limbo & approval from Dr. Mickeal Skinner, care is being transferred to Dr. Mickeal Skinner (Neuro Oncologist). Scheduling request placed. Called patient to advise that they should expect this change and that the scheduling department will call to advise once request is completed.

## 2017-06-16 NOTE — Patient Instructions (Signed)
We/ll send prescription for antifungal powder to your pharmacy. We will see you back here in 2 months. Continue to follow up with [facility] for phase II clinical trial for ongoing management of GBM.

## 2017-06-16 NOTE — Progress Notes (Signed)
HEMATOLOGY/ONCOLOGY CLINIC NOTE  Date of Service: 06/16/17  Patient Care Team: Sharilyn Sites, MD as PCP - General (Family Medicine) Ledon Snare MD (Radiation Oncology) Ashok Pall MD (Neurosurgery)  CHIEF COMPLAINTS/PURPOSE OF CONSULTATION:  f/u GBM  HISTORY OF PRESENTING ILLNESS:   Donna Valentine is a wonderful 76 y.o. female who has been referred to [place] by Dr Ashok Pall, MD  for evaluation and management of newly diagnosed Glioblastoma Multiforme.  Patient has a h/o HLD, PAC's and borderline HTN presented to the ED on 11/20/2016 with sudden onset of left arm and some leg weakness and on CT head R frontoparietal lesion of 2.2 cm w/ vasogenic edema which was further evaluated with MRI. 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 No findings concerning for bleed or infarct.  Patient was treated with high dose steroids and keppra for SZ prophylaxis. Patient subsequently had an uncomplicated right frontal stereotactic craniotomy for tumor resection of the dominant rt frontoparietal mass but not all the satellite lesions (2 were apparently clinically evident as per surgery notes). She has had improvement in her left sided strength since her surgery and her cranial wound has been dry and clean. She is ambulating with walker and eating well. She has been setup for home Physical therapy.  I met with the patient as she was readied to be discharged home. She was seen with several family members at bedside. We discussed the diagnosis , prognosis and standard of care treatment options available and option to go to [facility] for consideration of possible clinical trials. NCCN guidelines were provided and discussed.  Patient has not been evaluated by radiation oncology yet.  Patient notes that she did not have any significant health limitation prior to the  diagnosis of her GBM.  INTERVAL HISTORY  Patient is here with her husband for follow-up of her GBM. She was recently re-evaluated by [facility] by Dr Caryl Pina for potential candidacy for their clinical trial. Last MRI from 08/21 showed increased size of contrast-enhancing tumor in the right frontal lobe with associated increased surrounding hyperintense T2 weighted signal. From her f/u at [facility] she was offered several options for treatment including a clinical trial and off study option. She did decide to pursue forward with this trial. This trial is currently in phase II with delivery of their drug named [id number]. Otherwise, she has not had any new symptoms. She reports that her tremors to her bilateral hands has been mostly stayed the same and she is still taking Primidone '50mg'$  BID for control over this. Her oral thrush has improved since her last visit. Her candidal rash to her bilateral inferior breast has been worsened and she is requesting a refill of Clotrimazole powder which I previously prescribed. She has a small amount of this left which has helped with this at home.   Her dexamethasone was previously halted and she has not been on oral steroids since. She is still currently on Keppra for seizure prophylaxis. She denies any recent seizures, headaches, mouth sores, or any other associated new complaints.   MEDICAL HISTORY:      Past Medical History:  Diagnosis Date   Cancer ([place])   GBM  SURGICAL HISTORY:      Past Surgical History:  Procedure Laterality Date   APPENDECTOMY  5631   APPLICATION OF CRANIAL NAVIGATION N/A 11/28/2016   Procedure: APPLICATION OF CRANIAL NAVIGATION;  Surgeon: Ashok Pall, MD;  Location: [facility];  Service: Neurosurgery;  Laterality: N/A;   CESAREAN SECTION     x3   CRANIOTOMY Right 11/28/2016   Procedure: STERIOTACTIC PARIETAL CRANIOTOMY FOR RIGHT FRONTAL TUMOR RESECTION WITH BRAINLAB;  Surgeon: Ashok Pall, MD;  Location: [facility];  Service:  Neurosurgery;  Laterality: Right;    SOCIAL HISTORY: Social History        Social History   Marital status: Married    Spouse name: N/A   Number of children: N/A   Years of education: N/A      Occupational History   Not on file.        Social History Main Topics   Smoking status: Former Smoker    Quit date: 11/26/1991   Smokeless tobacco: Never Used   Alcohol use No   Drug use: No   Sexual activity: Not on file       Other Topics Concern   Not on file      Social History Narrative   No narrative on file    FAMILY HISTORY: History reviewed. No pertinent family history.  ALLERGIES:  is allergic to penicillins and food.  MEDICATIONS: . Current Outpatient Prescriptions on File Prior to Visit  Medication Sig Dispense Refill   dexamethasone (DECADRON) 4 MG tablet Take by mouth.     fluconazole (DIFLUCAN) 100 MG tablet Take 1 tablet (100 mg total) by mouth daily. 7 tablet 0   levETIRAcetam (KEPPRA) 500 MG tablet Take 1 tablet (500 mg total) by mouth 2 (two) times daily. 60 tablet 5   nystatin (MYCOSTATIN/NYSTOP) powder Apply topically 3 (three) times daily. Apply powder under the breast skin fold over the rash 30 g 1   ondansetron (ZOFRAN) 8 MG tablet Take 1 tablet (8 mg total) by mouth every 8 (eight) hours as needed for nausea. 30 tablet 3   primidone (MYSOLINE) 50 MG tablet Take 1 tablet (50 mg total) by mouth 2 (two) times daily. 60 tablet 2   temozolomide (TEMODAR) 100 MG capsule TAKE 4 CAPSULES BY MOUTH EVERY DAY FOR 5 DAYS EVERY 28 DAYS 20 capsule 0   No current facility-administered medications on file prior to visit.       REVIEW OF SYSTEMS:    10 Point review of Systems was done is negative except as noted above.  PHYSICAL EXAMINATION:  ECOG PERFORMANCE STATUS: 1 - Symptomatic but completely ambulatory BP 139/63 (BP Location: Left Arm, Patient Position: Sitting)    Pulse 65    Temp 97.7 F (36.5 C) (Oral)    Resp  17    Ht '5\' 6"'$  (1.676 m)    Wt 187 lb 6.4 oz (85 kg)    SpO2 97%    BMI 30.25 kg/m   GENERAL:alert, in no acute distress and comfortable SKIN: skin color, texture, turgor are normal, no rashes or significant lesions EYES: normal, conjunctiva are pink and non-injected, sclera clear OROPHARYNX:no exudate, no erythema and lips, buccal mucosa, and tongue normal. No thrush.  NECK: supple, no JVD, thyroid normal size, non-tender, without nodularity LYMPH:  no palpable lymphadenopathy in the cervical, axillary or inguinal LUNGS: clear to auscultation with normal respiratory effort HEART: regular rate & rhythm,  no murmurs and no lower extremity edema CHEST: Resolving candidal rash under both breast folds.  ABDOMEN: abdomen soft, non-tender, normoactive bowel sounds  Musculoskeletal: no cyanosis of digits and no clubbing  PSYCH: alert & oriented x 3 with fluent speech NEURO:4/5 LUE strength, 4+/5 LLE strength,  5/5 on rt side UE and LE  LABORATORY DATA:  I have reviewed the data as listed  . CBC Latest Ref Rng & Units 06/16/2017 05/19/2017 05/05/2017  WBC 3.9 - 10.3 10e3/uL 6.0 5.7 3.8(L)  Hemoglobin 11.6 - 15.9 g/dL 13.3 13.3 13.2  Hematocrit 34.8 - 46.6 % 40.8 40.1 39.5  Platelets 145 - 400 10e3/uL 109(L) 135(L) 99(L)   . CMP Latest Ref Rng & Units 06/16/2017 05/19/2017 04/28/2017  Glucose 70 - 140 mg/dl 98 110 84  BUN 7.0 - 26.0 mg/dL 16.1 18.4 17.4  Creatinine 0.6 - 1.1 mg/dL 0.9 0.9 0.9  Sodium 136 - 145 mEq/L 143 143 142  Potassium 3.5 - 5.1 mEq/L 3.5 3.8 3.6  Chloride 101 - 111 mmol/L - - -  CO2 22 - 29 mEq/L '29 28 25  '$ Calcium 8.4 - 10.4 mg/dL 9.3 9.4 9.5  Total Protein 6.4 - 8.3 g/dL 6.8 7.2 7.2  Total Bilirubin 0.20 - 1.20 mg/dL 0.44 0.36 0.38  Alkaline Phos 40 - 150 U/L 74 78 68  AST 5 - 34 U/L '15 15 19  '$ ALT 0 - 55 U/L '13 12 16         '$ RADIOGRAPHIC STUDIES: I have personally reviewed the radiological images as listed and agreed with the findings in the report.  .Mr  Jeri Cos Wo Contrast  Result Date: 06/02/2017 CLINICAL DATA:  Right frontal lobe glioblastoma status post resection followed by concurrent chemoradiation completed on 02/06/2017. Ongoing Temodar therapy. EXAM: MRI HEAD WITHOUT AND WITH CONTRAST TECHNIQUE: Multiplanar, multiecho pulse sequences of the brain and surrounding structures were obtained without and with intravenous contrast. CONTRAST:  30m MULTIHANCE GADOBENATE DIMEGLUMINE 529 MG/ML IV SOLN COMPARISON:  Brain MRI 04/28/2017 FINDINGS: Brain: The midline structures are normal. There is no focal diffusion restriction to indicate acute infarct. Confluent contrast-enhancement in the superior right frontal lobe has increased from the prior study and now measures 2.5 x 1.6 x 2.6 cm, previously 2.2 x 1.4 x 2.3 cm (AP x Transverse x CC). The surrounding T2 weighted signal hyperintensity has also increased, with slightly greater mass effect. There is minimal leftward midline bulging without herniation. No remote contrast-enhancement. Contralateral T2 hyperintensity is unchanged. Vascular: Major intracranial arterial and venous sinus flow voids are preserved. Skull and upper cervical spine: Remote right frontoparietal craniotomy. Sinuses/Orbits: No fluid levels or advanced mucosal thickening. No mastoid or middle ear effusion. Normal orbits. IMPRESSION: 1. Increased size of contrast-enhancing tumor in the right frontal lobe with associated increased surrounding hyperintense T2 weighted signal. The confluence of the contrast enhancement has also increased, consistent with disease progression. 2. No acute abnormality. Electronically Signed   By: KUlyses JarredM.D.   On: 06/02/2017 22:11   ASSESSMENT & PLAN:   76yo previously healthy caucasian female with no significant Chronic medical co-mrobidities with   1) Rt fronto-parietal GBM with a few satellite lesions. MGMT gene promoter methylation - not detected Patient has had complete resection of the dominant  lesions but not the visible daughter lesions. (Neurosurgery - Dr CChristella Noa. Patient has completed concurrent chemo-radiation on 02/06/2017 and tolerated it well. Blood counts stable. -MRI brain results from 02/13/2017 were discussed in details -- some increased enhancement at the site of treatment is thought to represent treatment effect presenting as pseudo-progression. -MRI brain 04/28/2017 - Increasing nodular enhancement in the posterior right frontal lobe with greatly increased vasogenic edema, concerning for progressive tumor although progressive post treatment changes are also possible. -MRI brain 06/02/17 - Increased size of  contrast-enhancing tumor in the right frontal lobe with associated increased surrounding hyperintense T2 weighted signal. The confluence of the contrast enhancement has also increased, consistent with disease progression. No other acute abnormality.   Patient has completed 2 cycles of adjuvant Temodar. 3rd cycle only received 2 days (held for thrombocytopenia) Patient was seen and [facility] in follow-up after her last MRI and was thought to have possible disease progression.  -They recommended repeat MRI which was performed on 06/02/2017 and showed disease progression.  -She was offered off trial treatment (lomustine + Avastin -BELOB trial or candidacy for a phase II clinical trial.  -She has been enrolled to proceed with the [facility] Phase 2 clinical trial at this time.  -We did discuss these options in great detail, but ultimately I advised her that she should direct all questions about this trial with her care team at [facility] as they would be able to best answer her questions.  -We will have her f/u with labs in 57moto assess her progression and consider further options as she is able to tolerate it at that time.  -I also discussed possibly switching her care to Dr VMickeal Skinner our new neuro-oncologist once he is able to start seeing new patients. They are interested and will consider this  option.   2) Thrombocytopenia related to temodar PLT at 64k -now improved to 135k. Today her platelets are stable at 109K (06/16/17).  Plan -We will again speak with Dr VMickeal Skinnerabout possibly switched over her care of this wonderful patient.  -Continue to have her f/u with [facility] for ongoing management during this clinical phase 2 trial  -Oral steroid therapy was halted with [facility] at this time.  -F/u w/ labs in 266moo assess her progress within her clinical trial.  -on keppra for SZ prophylaxis.  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 ?Worsened by anxiety and treatment. Thyroid function tests WNL Plan -improved on Primidone started by Dr PaPosey ProntoNow on Primidone 50 mg po BID -ativan prn  4) Abnormal LFts - likely from temodar. Resolved.  5) Bilateral inferior breast candidal rash -had previously resolved with nystatin anti-fungal powder, will re-prescribe this.   RTC with Dr kaIrene Limbon 40m49moth labs  -All of the patients questions were answered with apparent satisfaction. The patient knows to call the clinic with any problems, questions or concerns.  I spent 20 minutes counseling the patient face to face. The total time spent in the appointment was 25 minutes and more than 50% was on counseling and direct patient cares    GauSullivan Lone MS [place]HIVMS SCHHuntington Ambulatory Surgery CenterHLos Angeles Ambulatory Care Centermatology/Oncology Physician ConSouth BethlehemOffice):       [phone] cell):  [phone]):           [phone] document serves as a record of services personally performed by GauSullivan LoneD. It was created on his behalf by WilReola Mosher trained medical scribe. The creation of this record is based on the scribe's personal observations and the provider's statements to them. This document has been checked and approved by the attending provider.

## 2017-06-16 NOTE — Telephone Encounter (Signed)
Spoke with patient regarding the changes in her appt on 11/5.   Per 9/4 sch msg she is transferring her care to Dr.Vaslow from Dr.Kale so I changed her appt.

## 2017-06-17 ENCOUNTER — Telehealth: Payer: Self-pay

## 2017-06-17 NOTE — Telephone Encounter (Signed)
Talked to pt's daughter for f/u of question yesterday. Question had already been answered, but wondering about plt count of 109. Told her that typically we are not concerned unless plt count is less than 100. Previous Plt was 135, but two months prior were 99 and 64. Pt improved from those recent lab values.

## 2017-07-01 ENCOUNTER — Inpatient Hospital Stay
Admission: RE | Admit: 2017-07-01 | Discharge: 2017-07-12 | Disposition: A | Discharge status: Nursing Home (Includes ICF) | Payer: Medicare Other | Source: Ambulatory Visit | Attending: Internal Medicine | Admitting: Internal Medicine

## 2017-07-02 ENCOUNTER — Non-Acute Institutional Stay (SKILLED_NURSING_FACILITY): Payer: Medicare Other | Admitting: Internal Medicine

## 2017-07-02 ENCOUNTER — Encounter: Payer: Self-pay | Admitting: Internal Medicine

## 2017-07-02 DIAGNOSIS — R531 Weakness: Secondary | ICD-10-CM

## 2017-07-02 DIAGNOSIS — C711 Malignant neoplasm of frontal lobe: Secondary | ICD-10-CM | POA: Diagnosis not present

## 2017-07-02 NOTE — Progress Notes (Signed)
Provider:  Veleta Miners Location:    [place] Room Number: 126/P Place of Service:  SNF (31)  PCP: Sharilyn Sites, MD Patient Care Team: Sharilyn Sites, MD as PCP - General [facility])  Extended Emergency Contact Information Primary Emergency Contact: Dixon,Davis Address: [address]          [place],  [address] [place] Phone: [phone] Mobile Phone: [phone] Relation: Spouse Secondary Emergency Contact: Edson Snowball [place] Phone: [phone] Relation: Daughter  Code Status: DNR Goals of Care: Advanced Directive information Advanced Directives 07/02/2017  Does Patient Have a Medical Advance Directive? Yes  Type of Advance Directive (No Data)  Does patient want to make changes to medical advance directive? No - Patient declined  Would patient like information on creating a medical advance directive? -      Chief Complaint  Patient presents with  . New Admit To SNF    New Admission Visit    HPI: Patient is a 76 y.o. female seen today for admission to SNF for therapy. Patient with no significant past history was diagnosed with Right Frontal Glioblastoma. In 02/18 after she came to hospital for Left sided weakness. After undergoing craniotomy she received radiation therapy and Temodar Cycle. Her last dose was in 07/18. She had presented with worsening weakness of Left side and MRI revealed disease progression She was admitted to [facility] for Medicenna Trial.And had intratumoral cathter placed with Medicenna infusion on 06/25/17. She tolerated procedure well. And had stable Post op course. She is now in SNF  Per her daughter patient was very active before the diagnosis. But since then she has been walking with the walker and needing support at home. She lives with her husband and has 2 daughters who live close by and help her. Patient denies any pain , Nausea , Vomiting, Headache,fever oir  chills. Her appetite is good. Her spirits are good. She did c/o tremors in her hand   Past Medical History:  Diagnosis Date  . GBM (glioblastoma multiforme) ([place])    Past Surgical History:  Procedure Laterality Date  . APPENDECTOMY  1994  . APPLICATION OF CRANIAL NAVIGATION N/A 11/28/2016   Procedure: APPLICATION OF CRANIAL NAVIGATION;  Surgeon: Ashok Pall, MD;  Location: [place];  Service: Neurosurgery;  Laterality: N/A;  . CESAREAN SECTION     x3  . CRANIOTOMY Right 11/28/2016   Procedure: STERIOTACTIC PARIETAL CRANIOTOMY FOR RIGHT FRONTAL TUMOR RESECTION WITH BRAINLAB;  Surgeon: Ashok Pall, MD;  Location: [place];  Service: Neurosurgery;  Laterality: Right;    reports that she quit smoking about 25 years ago. She has never used smokeless tobacco. She reports that she does not drink alcohol or use drugs. Social History   Social History  . Marital status: Married    Spouse name: N/A  . Number of children: N/A  . Years of education: N/A   Occupational History  . Not on file.   Social History Main Topics  . Smoking status: Former Smoker    Quit date: 11/26/1991  . Smokeless tobacco: Never Used  . Alcohol use No  . Drug use: No  . Sexual activity: Not Currently   Other Topics Concern  . Not on file   Social History Narrative  . No narrative on file    Functional Status Survey:    Family History  Problem Relation Age of Onset  . Heart disease Mother   . Alzheimer's disease Brother   .  Cancer Neg Hx     Health Maintenance  Topic Date Due  . TETANUS/TDAP  09/17/1961  . PNA vac Low Risk Adult (2 of 2 - PCV13) 07/28/2009  . COLONOSCOPY  09/03/2014  . MAMMOGRAM  01/28/2015  . INFLUENZA VACCINE  05/13/2017  . DEXA SCAN  Completed    Allergies  Allergen Reactions  . Penicillins Other (See Comments)    Hallucination Has patient had a PCN reaction causing immediate rash, facial/tongue/throat swelling, SOB or lightheadedness with hypotension: No Has patient had  a PCN reaction causing severe rash involving mucus membranes or skin necrosis: No Has patient had a PCN reaction that required hospitalization No Has patient had a PCN reaction occurring within the last 10 years: No If all of the above answers are "NO", then may proceed with Cephalosporin use.   . Food     Pineapple-Nausea/vomiting    Outpatient Encounter Prescriptions as of 07/02/2017  Medication Sig  . acetaminophen (TYLENOL) 325 MG tablet Take 650 mg by mouth 4 (four) times daily. Until 07/04/2017 then  Take 3 tablets (975 mg) by mouth every 6 hours prn for pain for up to 10 days  . dexamethasone (DECADRON) 1 MG tablet Take 2 mg by mouth twice a day until 07/02/2017. Then  take 1 mg by mouth twice a day. Then take 1 mg by mouth once a day until F/U with MD.  . levETIRAcetam (KEPPRA) 500 MG tablet Take 1 tablet (500 mg total) by mouth 2 (two) times daily.  Marland Kitchen nystatin (NYSTATIN) powder Apply 100,000 g topically daily as needed.  . ondansetron (ZOFRAN) 8 MG tablet Take 1 tablet (8 mg total) by mouth every 8 (eight) hours as needed for nausea.  . pantoprazole (PROTONIX) 40 MG tablet Take 40 mg by mouth daily.  . polyethylene glycol (MIRALAX / GLYCOLAX) packet Take 17 g by mouth daily.  . primidone (MYSOLINE) 50 MG tablet Take 1 tablet (50 mg total) by mouth 2 (two) times daily.  Marland Kitchen senna (SENOKOT) 8.6 MG tablet Take 2 tablets by mouth 2 (two) times daily.  . [DISCONTINUED] nystatin (MYCOSTATIN/NYSTOP) powder Apply topically 3 (three) times daily. Apply powder under the breast skin fold over the rash   No facility-administered encounter medications on file as of 07/02/2017.      Review of Systems  Review of Systems  Constitutional: Negative for activity change, appetite change, chills, diaphoresis, fatigue and fever.  HENT: Negative for mouth sores, postnasal drip, rhinorrhea, sinus pain and sore throat.   Respiratory: Negative for apnea, cough, chest tightness, shortness of breath and  wheezing.   Cardiovascular: Negative for chest pain, palpitations and leg swelling.  Gastrointestinal: Negative for abdominal distention, abdominal pain, constipation, diarrhea, nausea and vomiting.  Genitourinary: Negative for dysuria and frequency.  Musculoskeletal: Negative for arthralgias, joint swelling and myalgias.  Skin: Negative for rash.  Neurological: Negative for dizziness, syncope, weakness, light-headedness and numbness.  Psychiatric/Behavioral: Negative for behavioral problems, confusion and sleep disturbance.     Vitals:   07/02/17 1106  BP: 121/74  Pulse: 71  Resp: 20  Temp: (!) 97.4 F (36.3 C)  TempSrc: Oral  SpO2: 94%   There is no height or weight on file to calculate BMI. Physical Exam  Constitutional: She is oriented to person, place, and time. She appears well-developed and well-nourished.  HENT:  Head: Normocephalic.  Mouth/Throat: Oropharynx is clear and moist.  Eyes: Pupils are equal, round, and reactive to light.  Neck: Neck supple.  Cardiovascular: Normal rate and  normal heart sounds.   No murmur heard. Pulmonary/Chest: Effort normal and breath sounds normal.  Few crackles bilateral  Abdominal: Soft. Bowel sounds are normal. She exhibits no distension. There is no tenderness. There is no rebound.  Musculoskeletal: She exhibits no edema.  Neurological: She is alert and oriented to person, place, and time.  Has good strength in RUE an RLE. 3-4/5 in Left UE 4/5 in Left LE  Skin: Skin is warm and dry.  Psychiatric: She has a normal mood and affect. Her behavior is normal. Judgment and thought content normal.    Labs reviewed: Basic Metabolic Panel:  Recent Labs  11/20/16 1652 11/20/16 1706  04/28/17 0838 05/19/17 0900 06/16/17 0917  NA 139 143  < > 142 143 143  K 3.6 3.6  < > 3.6 3.8 3.5  CL 103 104  --   --   --   --   CO2 26  --   < > 25 28 29   GLUCOSE 111* 111*  < > 84 110 98  BUN 9 11  < > 17.4 18.4 16.1  CREATININE 0.82 0.80  <  > 0.9 0.9 0.9  CALCIUM 9.3  --   < > 9.5 9.4 9.3  < > = values in this interval not displayed. Liver Function Tests:  Recent Labs  04/28/17 0838 05/19/17 0900 06/16/17 0917  AST 19 15 15   ALT 16 12 13   ALKPHOS 68 78 74  BILITOT 0.38 0.36 0.44  PROT 7.2 7.2 6.8  ALBUMIN 3.1* 3.0* 3.0*   No results for input(s): LIPASE, AMYLASE in the last 8760 hours. No results for input(s): AMMONIA in the last 8760 hours. CBC:  Recent Labs  05/05/17 1146 05/19/17 0900 06/16/17 0917  WBC 3.8* 5.7 6.0  NEUTROABS 2.4 3.9 4.2  HGB 13.2 13.3 13.3  HCT 39.5 40.1 40.8  MCV 90.4 91.8 93.2  PLT 99* 135* 109*   Cardiac Enzymes: No results for input(s): CKTOTAL, CKMB, CKMBINDEX, TROPONINI in the last 8760 hours. BNP: Invalid input(s): POCBNP No results found for: HGBA1C Lab Results  Component Value Date   TSH 0.363 03/12/2017   No results found for: VITAMINB12 No results found for: FOLATE No results found for: IRON, TIBC, FERRITIN  Imaging and Procedures obtained prior to SNF admission: No results found.  Assessment/Plan  Glioblastoma multiforme of frontal lobe  With left sided weakness Patient enrolled in trial in [place]. She is stable and will work with therapy in SNF. Will follow labs in 1 week. But she does have extensive follow up with Oncology She is on seizure Prophylaxis Also On tapering dose of dexamethasone She is on Primidone for tremors. Continue on Protonix. Plan for patient to go home with family when more stable.     Family/ staff Communication:   Labs/tests ordered: BMP, CBC 1 week.

## 2017-07-08 ENCOUNTER — Other Ambulatory Visit: Payer: Self-pay | Admitting: Radiation Therapy

## 2017-07-08 DIAGNOSIS — C711 Malignant neoplasm of frontal lobe: Secondary | ICD-10-CM

## 2017-07-09 ENCOUNTER — Encounter (HOSPITAL_COMMUNITY)
Admission: RE | Admit: 2017-07-09 | Discharge: 2017-07-09 | Disposition: A | Discharge status: Home / Self Care | Payer: Medicare Other | Source: Skilled Nursing Facility | Attending: Internal Medicine | Admitting: Internal Medicine

## 2017-07-09 DIAGNOSIS — E876 Hypokalemia: Secondary | ICD-10-CM | POA: Insufficient documentation

## 2017-07-09 DIAGNOSIS — Z48811 Encounter for surgical aftercare following surgery on the nervous system: Secondary | ICD-10-CM | POA: Insufficient documentation

## 2017-07-09 DIAGNOSIS — N390 Urinary tract infection, site not specified: Secondary | ICD-10-CM | POA: Insufficient documentation

## 2017-07-09 LAB — BASIC METABOLIC PANEL
ANION GAP: 8 (ref 5–15)
BUN: 18 mg/dL (ref 6–20)
CALCIUM: 8.7 mg/dL — AB (ref 8.9–10.3)
CO2: 29 mmol/L (ref 22–32)
Chloride: 104 mmol/L (ref 101–111)
Creatinine, Ser: 0.64 mg/dL (ref 0.44–1.00)
GFR calc Af Amer: >60 mL/min (ref >60)
GLUCOSE: 100 mg/dL — AB (ref 65–99)
Potassium: 3.7 mmol/L (ref 3.5–5.1)
SODIUM: 141 mmol/L (ref 135–145)

## 2017-07-09 LAB — CBC
HCT: 38.3 % (ref 36.0–46.0)
Hemoglobin: 12.3 g/dL (ref 12.0–15.0)
MCH: 31 pg (ref 26.0–34.0)
MCHC: 32.1 g/dL (ref 30.0–36.0)
MCV: 96.5 fL (ref 78.0–100.0)
PLATELETS: 134 10*3/uL — AB (ref 150–400)
RBC: 3.97 MIL/uL (ref 3.87–5.11)
RDW: 13.6 % (ref 11.5–15.5)
WBC: 6.1 10*3/uL (ref 4.0–10.5)

## 2017-07-11 ENCOUNTER — Encounter (HOSPITAL_COMMUNITY)
Admission: RE | Admit: 2017-07-11 | Discharge: 2017-07-11 | Disposition: A | Discharge status: Home / Self Care | Payer: Medicare Other

## 2017-07-11 ENCOUNTER — Encounter (HOSPITAL_COMMUNITY)
Admission: RE | Admit: 2017-07-11 | Discharge: 2017-07-11 | Disposition: A | Discharge status: Home / Self Care | Payer: Medicare Other | Source: Skilled Nursing Facility | Attending: *Deleted | Admitting: *Deleted

## 2017-07-11 DIAGNOSIS — Z48811 Encounter for surgical aftercare following surgery on the nervous system: Secondary | ICD-10-CM | POA: Diagnosis present

## 2017-07-11 DIAGNOSIS — E876 Hypokalemia: Secondary | ICD-10-CM | POA: Diagnosis present

## 2017-07-11 DIAGNOSIS — N390 Urinary tract infection, site not specified: Secondary | ICD-10-CM | POA: Diagnosis not present

## 2017-07-11 LAB — URINALYSIS, ROUTINE W REFLEX MICROSCOPIC
BILIRUBIN URINE: NEGATIVE
Glucose, UA: NEGATIVE mg/dL
KETONES UR: NEGATIVE mg/dL
NITRITE: NEGATIVE
PROTEIN: NEGATIVE mg/dL
Specific Gravity, Urine: 1.004 — ABNORMAL LOW (ref 1.005–1.030)
pH: 6 (ref 5.0–8.0)

## 2017-07-11 LAB — COMPREHENSIVE METABOLIC PANEL
ALBUMIN: 3.2 g/dL — AB (ref 3.5–5.0)
ALK PHOS: 52 U/L (ref 38–126)
ALT: 25 U/L (ref 14–54)
ANION GAP: 10 (ref 5–15)
AST: 21 U/L (ref 15–41)
BUN: 13 mg/dL (ref 6–20)
CALCIUM: 8.7 mg/dL — AB (ref 8.9–10.3)
CHLORIDE: 104 mmol/L (ref 101–111)
CO2: 29 mmol/L (ref 22–32)
Creatinine, Ser: 0.86 mg/dL (ref 0.44–1.00)
GFR calc non Af Amer: >60 mL/min (ref >60)
GLUCOSE: 140 mg/dL — AB (ref 65–99)
POTASSIUM: 3.8 mmol/L (ref 3.5–5.1)
SODIUM: 143 mmol/L (ref 135–145)
Total Bilirubin: 0.4 mg/dL (ref 0.3–1.2)
Total Protein: 6.7 g/dL (ref 6.5–8.1)

## 2017-07-11 LAB — CBC WITH DIFFERENTIAL/PLATELET
BASOS PCT: 0 %
Basophils Absolute: 0 10*3/uL (ref 0.0–0.1)
EOS ABS: 0 10*3/uL (ref 0.0–0.7)
EOS PCT: 1 %
HCT: 39.8 % (ref 36.0–46.0)
HEMOGLOBIN: 13 g/dL (ref 12.0–15.0)
LYMPHS ABS: 1 10*3/uL (ref 0.7–4.0)
Lymphocytes Relative: 16 %
MCH: 31.1 pg (ref 26.0–34.0)
MCHC: 32.7 g/dL (ref 30.0–36.0)
MCV: 95.2 fL (ref 78.0–100.0)
Monocytes Absolute: 0.6 10*3/uL (ref 0.1–1.0)
Monocytes Relative: 10 %
NEUTROS PCT: 73 %
Neutro Abs: 4.7 10*3/uL (ref 1.7–7.7)
PLATELETS: 120 10*3/uL — AB (ref 150–400)
RBC: 4.18 MIL/uL (ref 3.87–5.11)
RDW: 13.5 % (ref 11.5–15.5)
WBC: 6.3 10*3/uL (ref 4.0–10.5)

## 2017-07-11 LAB — TSH: TSH: 1.007 u[IU]/mL (ref 0.350–4.500)

## 2017-07-11 LAB — AMMONIA: AMMONIA: 12 umol/L (ref 9–35)

## 2017-07-12 ENCOUNTER — Encounter (HOSPITAL_COMMUNITY): Payer: Self-pay | Admitting: Emergency Medicine

## 2017-07-12 ENCOUNTER — Inpatient Hospital Stay
Admission: RE | Admit: 2017-07-12 | Discharge: 2017-08-29 | Disposition: A | Discharge status: Home / Self Care | Payer: Medicare Other | Source: Ambulatory Visit | Attending: Internal Medicine | Admitting: Internal Medicine

## 2017-07-12 ENCOUNTER — Emergency Department (HOSPITAL_COMMUNITY)
Admission: EM | Admit: 2017-07-12 | Discharge: 2017-07-12 | Disposition: A | Discharge status: Skilled Nursing Facility | Payer: Medicare Other | Attending: Emergency Medicine | Admitting: Emergency Medicine

## 2017-07-12 DIAGNOSIS — N390 Urinary tract infection, site not specified: Secondary | ICD-10-CM | POA: Diagnosis not present

## 2017-07-12 DIAGNOSIS — B965 Pseudomonas (aeruginosa) (mallei) (pseudomallei) as the cause of diseases classified elsewhere: Secondary | ICD-10-CM | POA: Insufficient documentation

## 2017-07-12 DIAGNOSIS — C719 Malignant neoplasm of brain, unspecified: Secondary | ICD-10-CM | POA: Insufficient documentation

## 2017-07-12 DIAGNOSIS — R609 Edema, unspecified: Secondary | ICD-10-CM

## 2017-07-12 DIAGNOSIS — Z79899 Other long term (current) drug therapy: Secondary | ICD-10-CM | POA: Insufficient documentation

## 2017-07-12 DIAGNOSIS — Z87891 Personal history of nicotine dependence: Secondary | ICD-10-CM | POA: Diagnosis not present

## 2017-07-12 DIAGNOSIS — B9620 Unspecified Escherichia coli [E. coli] as the cause of diseases classified elsewhere: Secondary | ICD-10-CM | POA: Diagnosis not present

## 2017-07-12 DIAGNOSIS — R4182 Altered mental status, unspecified: Secondary | ICD-10-CM | POA: Diagnosis present

## 2017-07-12 LAB — CBC
HEMATOCRIT: 39.5 % (ref 36.0–46.0)
Hemoglobin: 13 g/dL (ref 12.0–15.0)
MCH: 30.8 pg (ref 26.0–34.0)
MCHC: 32.9 g/dL (ref 30.0–36.0)
MCV: 93.6 fL (ref 78.0–100.0)
Platelets: 114 10*3/uL — ABNORMAL LOW (ref 150–400)
RBC: 4.22 MIL/uL (ref 3.87–5.11)
RDW: 13.6 % (ref 11.5–15.5)
WBC: 6.6 10*3/uL (ref 4.0–10.5)

## 2017-07-12 LAB — BASIC METABOLIC PANEL
Anion gap: 8 (ref 5–15)
BUN: 16 mg/dL (ref 6–20)
CHLORIDE: 107 mmol/L (ref 101–111)
CO2: 27 mmol/L (ref 22–32)
CREATININE: 0.73 mg/dL (ref 0.44–1.00)
Calcium: 8.8 mg/dL — ABNORMAL LOW (ref 8.9–10.3)
Glucose, Bld: 140 mg/dL — ABNORMAL HIGH (ref 65–99)
POTASSIUM: 4 mmol/L (ref 3.5–5.1)
SODIUM: 142 mmol/L (ref 135–145)

## 2017-07-12 LAB — URINALYSIS, ROUTINE W REFLEX MICROSCOPIC
BILIRUBIN URINE: NEGATIVE
GLUCOSE, UA: NEGATIVE mg/dL
KETONES UR: NEGATIVE mg/dL
NITRITE: NEGATIVE
PH: 6 (ref 5.0–8.0)
PROTEIN: NEGATIVE mg/dL
Specific Gravity, Urine: 1.011 (ref 1.005–1.030)

## 2017-07-12 MED ORDER — DEXTROSE 5 % IV SOLN
1.0000 g | Freq: Once | INTRAVENOUS | Status: AC
  Administered 2017-07-12: 1 g via INTRAVENOUS
  Filled 2017-07-12: qty 10

## 2017-07-12 MED ORDER — CEPHALEXIN 500 MG PO CAPS: 500.0000 mg | ORAL_CAPSULE | Freq: Four times a day (QID) | ORAL | 0 refills | Status: DC

## 2017-07-12 NOTE — ED Notes (Signed)
Have attempted multiple times to contact [facility] to let them know pt would be returning to them. Unable to get an answer. Notified house supervisor, Octavia Bruckner who gave me a couple of other numbers to try but still no answer. Pt's family member to [facility] to get them to call us.

## 2017-07-12 NOTE — ED Notes (Signed)
Pt given [name] to drink. Tolerating well.

## 2017-07-12 NOTE — ED Triage Notes (Signed)
Pt sent to ED from [facility] for increasing confusion and weakness since yesterday. Pt family also reports frequent urination.   Pt is currently in a clinical trial at [facility] being treated for Glioblastoma.

## 2017-07-12 NOTE — ED Provider Notes (Signed)
[place] DEPT Provider Note   CSN: [id number] Arrival date & time: 07/12/17  1846     History   Chief Complaint Chief Complaint  Patient presents with  . Altered Mental Status    HPI Donna Valentine is a 76 y.o. female.  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  Pt is on tapering dose decadron, now just .5.  Patient with generalized weakness, increased urine frequency, general malaise for the past few days. Pt is eating and drinking. No vomiting. Denies worsening or severe headaches. No new neurologic symptoms, no change in speech or vision, no new weakness. No fever or chills.    The history is provided by the patient.  Altered Mental Status   Pertinent negatives include no confusion.    Past Medical History:  Diagnosis Date  . GBM (glioblastoma multiforme) [facility])     Patient Active Problem List   Diagnosis Date Noted  . Oral candida 01/08/2017  . Goals of care, counseling/discussion   . 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 Left-sided weakness 11/20/2016  . 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 HYPERCHOLESTEROLEMIA, BORDERLINE 07/28/2008  . ANEMIA, MILD 07/28/2008  . DIVERTICULOSIS OF COLON 07/28/2008  . HEMATURIA, HX OF 07/28/2008  . COLONIC POLYPS 07/27/2008  . RIGHT BUNDLE BRANCH BLOCK 07/27/2008  . PREMATURE ATRIAL CONTRACTIONS 07/27/2008  . HEMORRHOIDS 07/27/2008  . VENOUS INSUFFICIENCY 07/27/2008  . BACK PAIN, LUMBAR 07/27/2008  . SCOLIOSIS 07/27/2008  . PALPITATIONS, HX OF 07/27/2008    Past Surgical History:  Procedure Laterality Date  . APPENDECTOMY  1994  . APPLICATION OF CRANIAL NAVIGATION N/A 11/28/2016   Procedure: APPLICATION OF CRANIAL NAVIGATION;  Surgeon: Ashok Pall, MD;  Location: [place];  Service: Neurosurgery;  Laterality: N/A;  .  CESAREAN SECTION     x3  . CRANIOTOMY Right 11/28/2016   Procedure: STERIOTACTIC PARIETAL CRANIOTOMY FOR RIGHT FRONTAL TUMOR RESECTION WITH BRAINLAB;  Surgeon: Ashok Pall, MD;  Location: [place];  Service: Neurosurgery;  Laterality: Right;    OB History    No data available       Home Medications    Prior to Admission medications   Medication Sig Start Date End Date Taking? Authorizing Provider  acetaminophen (TYLENOL) 325 MG tablet Take 650 mg by mouth 4 (four) times daily. Until 07/04/2017 then  Take 3 tablets (975 mg) by mouth every 6 hours prn for pain for up to 10 days    [provider]  dexamethasone (DECADRON) 1 MG tablet Take 2 mg by mouth twice a day until 07/02/2017. Then  take 1 mg by mouth twice a day. Then take 1 mg by mouth once a day until F/U with MD.    [provider]  levETIRAcetam (KEPPRA) 500 MG tablet Take 1 tablet (500 mg total) by mouth 2 (two) times daily. 01/26/17   Tyler Pita, MD  nystatin (NYSTATIN) powder Apply 100,000 g topically daily as needed.    [provider]  ondansetron (ZOFRAN) 8 MG tablet Take 1 tablet (8 mg total) by mouth every 8 (eight) hours as needed for nausea. 12/15/16   Brunetta Genera, MD  pantoprazole (PROTONIX) 40 MG tablet Take 40 mg by mouth daily.    [provider]  polyethylene glycol (MIRALAX / GLYCOLAX) packet Take 17 g by mouth daily.    [provider]  primidone (MYSOLINE) 50 MG tablet Take 1 tablet (50 mg total) by mouth 2 (two) times daily. 05/25/17   Tat, Eustace Quail, DO  senna (SENOKOT) 8.6 MG tablet Take 2 tablets by mouth 2 (two) times daily.    [provider]    Family History Family History  Problem Relation Age of Onset  . Heart disease Mother   . Alzheimer's disease Brother   . Cancer Neg Hx     Social History Social History  Substance Use Topics  . Smoking status: Former Smoker    Quit date: 11/26/1991  . Smokeless tobacco: Never Used  . Alcohol use  No     Allergies   Penicillins and Food   Review of Systems Review of Systems  Constitutional: Negative for fever.  HENT: Negative for trouble swallowing.   Eyes: Negative for visual disturbance.  Respiratory: Negative for cough and shortness of breath.   Cardiovascular: Negative for chest pain.  Gastrointestinal: Negative for abdominal pain, diarrhea and vomiting.  Genitourinary: Positive for frequency. Negative for dysuria and flank pain.  Musculoskeletal: Negative for back pain.  Skin: Negative for rash.  Neurological: Negative for speech difficulty.  Hematological: Does not bruise/bleed easily.  Psychiatric/Behavioral: Negative for confusion.     Physical Exam Updated Vital Signs BP 123/76   Pulse 87   Temp 98.7 F (37.1 C)   Resp 18   Ht 1.664 m (5' 5.5")   Wt 83 kg (183 lb)   SpO2 95%   BMI 29.99 kg/m   Physical Exam  Constitutional: She appears well-developed and well-nourished. No distress.  HENT:  Mouth/Throat: Oropharynx is clear and moist.  Eyes: Pupils are equal, round, and reactive to light. Conjunctivae are normal. No scleral icterus.  Neck: Neck supple. No tracheal deviation present.  Cardiovascular: Normal rate, regular rhythm and intact distal pulses.  Exam reveals no gallop and no friction rub.   No murmur heard. Pulmonary/Chest: Effort normal and breath sounds normal. No respiratory distress.  Abdominal: Soft. Normal appearance and bowel sounds are normal. She exhibits no distension. There is no tenderness.  Genitourinary:  Genitourinary Comments: No cva tenderness  Musculoskeletal: She exhibits no edema.  Neurological: She is alert.  Left weakness (baseline per pt).   Skin: Skin is warm and dry. No rash noted. She is not diaphoretic.  Psychiatric: She has a normal mood and affect.  Nursing note and vitals reviewed.    ED Treatments / Results  Labs (all labs ordered are listed, but only abnormal results are displayed) Results for orders  placed or performed during the hospital encounter of 33/29/51  Basic metabolic panel  Result Value Ref Range   Sodium 142 135 - 145 mmol/L   Potassium 4.0 3.5 - 5.1 mmol/L   Chloride 107 101 - 111 mmol/L   CO2 27 22 - 32 mmol/L   Glucose, Bld 140 (H) 65 - 99 mg/dL   BUN 16 6 - 20 mg/dL   Creatinine, Ser 0.73 0.44 - 1.00 mg/dL   Calcium 8.8 (L) 8.9 - 10.3 mg/dL   GFR calc non Af Amer >60 >60 mL/min   GFR calc Af Amer >60 >60 mL/min   Anion gap 8 5 - 15  CBC  Result Value Ref Range   WBC 6.6 4.0 - 10.5 K/uL   RBC 4.22 3.87 - 5.11 MIL/uL   Hemoglobin 13.0 12.0 - 15.0 g/dL   HCT 39.5 36.0 - 46.0 %   MCV 93.6 78.0 - 100.0 fL   MCH  30.8 26.0 - 34.0 pg   MCHC 32.9 30.0 - 36.0 g/dL   RDW 13.6 11.5 - 15.5 %   Platelets 114 (L) 150 - 400 K/uL  Urinalysis, Routine w reflex microscopic  Result Value Ref Range   Color, Urine YELLOW YELLOW   APPearance HAZY (A) CLEAR   Specific Gravity, Urine 1.011 1.005 - 1.030   pH 6.0 5.0 - 8.0   Glucose, UA NEGATIVE NEGATIVE mg/dL   Hgb urine dipstick MODERATE (A) NEGATIVE   Bilirubin Urine NEGATIVE NEGATIVE   Ketones, ur NEGATIVE NEGATIVE mg/dL   Protein, ur NEGATIVE NEGATIVE mg/dL   Nitrite NEGATIVE NEGATIVE   Leukocytes, UA LARGE (A) NEGATIVE   RBC / HPF 0-5 0 - 5 RBC/hpf   WBC, UA TOO NUMEROUS TO COUNT 0 - 5 WBC/hpf   Bacteria, UA MANY (A) NONE SEEN   Squamous Epithelial / LPF 0-5 (A) NONE SEEN    EKG  EKG Interpretation  Date/Time:  Sunday July 12 2017 19:07:20 EDT Ventricular Rate:  84 PR Interval:    QRS Duration: 127 QT Interval:  392 QTC Calculation: 464 R Axis:   13 Text Interpretation:  Sinus rhythm Atrial premature complex Right bundle branch block No significant change since last tracing Confirmed by Lajean Saver [phone]) on 07/12/2017 8:59:46 PM       Radiology No results found.  Procedures Procedures (including critical care time)  Medications Ordered in ED Medications - No data to display   Initial  Impression / Assessment and Plan / ED Course  I have reviewed the triage vital signs and the nursing notes.  Pertinent labs & imaging results that were available during my care of the patient were reviewed by me and considered in my medical decision making (see chart for details).  Labs.   Reviewed nursing notes and prior charts for additional history.   ?whether symptoms related to recent infusion therapy, in combination with now lower/tapering dose decadron, ?new/inc edema.   uti on labs. u cx sent. Rocephin iv.  Patient appears stable for d/c.     Final Clinical Impressions(s) / ED Diagnoses   Final diagnoses:  None    New Prescriptions New Prescriptions   No medications on file     Lajean Saver, MD 07/12/17 2153

## 2017-07-12 NOTE — Discharge Instructions (Signed)
It was our pleasure to provide your ER care today - we hope that you feel better.  The lab tests show a urine infection.    Take antibiotic as prescribed.  Drink adequate fluids.  Follow up with your primary care doctor.   Return to ER if worse, new symptoms, persistent vomiting, trouble breathing, other concern.

## 2017-07-13 ENCOUNTER — Non-Acute Institutional Stay (SKILLED_NURSING_FACILITY): Payer: Medicare Other | Admitting: Internal Medicine

## 2017-07-13 ENCOUNTER — Encounter (HOSPITAL_COMMUNITY)
Admission: RE | Admit: 2017-07-13 | Discharge: 2017-07-13 | Disposition: A | Discharge status: Home / Self Care | Payer: Medicare Other | Source: Skilled Nursing Facility | Attending: Internal Medicine | Admitting: Internal Medicine

## 2017-07-13 ENCOUNTER — Encounter: Payer: Self-pay | Admitting: Internal Medicine

## 2017-07-13 DIAGNOSIS — N390 Urinary tract infection, site not specified: Secondary | ICD-10-CM | POA: Insufficient documentation

## 2017-07-13 DIAGNOSIS — R531 Weakness: Secondary | ICD-10-CM

## 2017-07-13 DIAGNOSIS — R410 Disorientation, unspecified: Secondary | ICD-10-CM

## 2017-07-13 DIAGNOSIS — E876 Hypokalemia: Secondary | ICD-10-CM | POA: Insufficient documentation

## 2017-07-13 DIAGNOSIS — Z48811 Encounter for surgical aftercare following surgery on the nervous system: Secondary | ICD-10-CM | POA: Insufficient documentation

## 2017-07-13 LAB — BASIC METABOLIC PANEL
Anion gap: 8 (ref 5–15)
BUN: 15 mg/dL (ref 6–20)
CALCIUM: 8.9 mg/dL (ref 8.9–10.3)
CO2: 30 mmol/L (ref 22–32)
Chloride: 103 mmol/L (ref 101–111)
Creatinine, Ser: 0.76 mg/dL (ref 0.44–1.00)
GFR calc Af Amer: >60 mL/min (ref >60)
GFR calc non Af Amer: >60 mL/min (ref >60)
GLUCOSE: 100 mg/dL — AB (ref 65–99)
Potassium: 3.6 mmol/L (ref 3.5–5.1)
Sodium: 141 mmol/L (ref 135–145)

## 2017-07-13 NOTE — Progress Notes (Signed)
Location:   [facility] Room Number: 126/P Place of Service:  SNF (31) Provider:  Dakota Vanwart,Urania Pearlman  Patient, No Pcp Per  Patient Care Team: Patient, No Pcp Per as PCP - General (General Practice)  Extended Emergency Contact Information Primary Emergency Contact: Dixon,Davis Address: [address]          [place], [place] [address] [place] Phone: [phone] Mobile Phone: [phone] Relation: Spouse Secondary Emergency Contact: Edson Snowball [place] Phone: [phone] Relation: Daughter  Code Status:  Full Code Goals of care: Advanced Directive information Advanced Directives 07/13/2017  Does Patient Have a Medical Advance Directive? Yes  Type of Advance Directive (No Data)  Does patient want to make changes to medical advance directive? No - Patient declined  Would patient like information on creating a medical advance directive? -     Chief Complaint  Patient presents with  . Acute Visit    F/U fromED for confusion and UTI    HPI:  Pt is a 76 y.o. female seen today for an acute visit for  Follow up from ED visit for confusion.  Patient with no significant past history was diagnosed with Right Frontal Glioblastoma. In 02/18 After undergoing craniotomy she received radiation therapy and Temodar Cycle. Her last dose was in 07/18. She had presented with worsening weakness of Left side and MRI revealed disease progression She was admitted to [facility] for Medicenna Trial.And had intratumoral cathter placed with Medicenna infusion on 06/25/17. She tolerated procedure well. And had stable Post op course. She is now in SNF  For therapy.  Patient was doing well with therapy but according to family had been feeling weak recently. She also had c/o Urinary frequency. No fever or chills. No dysuria. She was mildly Confused yesterday and was sent to the ED for evaluation.'She was found to have UTI and was started on  Keflex. She says she feels better. Confusion is better. But continues to be weak.     Past Medical History:  Diagnosis Date  . GBM (glioblastoma multiforme) ([place])    Past Surgical History:  Procedure Laterality Date  . APPENDECTOMY  1994  . APPLICATION OF CRANIAL NAVIGATION N/A 11/28/2016   Procedure: APPLICATION OF CRANIAL NAVIGATION;  Surgeon: Ashok Pall, MD;  Location: [facility];  Service: Neurosurgery;  Laterality: N/A;  . CESAREAN SECTION     x3  . CRANIOTOMY Right 11/28/2016   Procedure: STERIOTACTIC PARIETAL CRANIOTOMY FOR RIGHT FRONTAL TUMOR RESECTION WITH BRAINLAB;  Surgeon: Ashok Pall, MD;  Location: [facility];  Service: Neurosurgery;  Laterality: Right;    Allergies  Allergen Reactions  . Penicillins Other (See Comments)    Hallucination Has patient had a PCN reaction causing immediate rash, facial/tongue/throat swelling, SOB or lightheadedness with hypotension: No Has patient had a PCN reaction causing severe rash involving mucus membranes or skin necrosis: No Has patient had a PCN reaction that required hospitalization No Has patient had a PCN reaction occurring within the last 10 years: No If all of the above answers are "NO", then may proceed with Cephalosporin use.   . Food     Pineapple-Nausea/vomiting    Allergies as of 07/13/2017      Reactions   Penicillins Other (See Comments)   Hallucination Has patient had a PCN reaction causing immediate rash, facial/tongue/throat swelling, SOB or lightheadedness with hypotension: No Has patient had a PCN reaction causing severe rash involving mucus membranes or skin necrosis: No Has patient  had a PCN reaction that required hospitalization No Has patient had a PCN reaction occurring within the last 10 years: No If all of the above answers are "NO", then may proceed with Cephalosporin use.   Food    Pineapple-Nausea/vomiting      Medication List    Notice   This visit is during an admission. Changes to the med list  made in this visit will be reflected in the After Visit Summary of the admission.     Review of Systems  Constitutional: Positive for activity change.  Genitourinary: Positive for frequency and urgency.  All other systems reviewed and are negative.   Immunization History  Administered Date(s) Administered  . Influenza Whole 07/25/2009  . Pneumococcal Polysaccharide-23 07/28/2008   Pertinent  Health Maintenance Due  Topic Date Due  . MAMMOGRAM  07/31/2017 (Originally 01/28/2015)  . COLONOSCOPY  07/31/2017 (Originally 09/03/2014)  . PNA vac Low Risk Adult (2 of 2 - PCV13) 07/31/2017 (Originally 07/28/2009)  . INFLUENZA VACCINE  09/12/2017 (Originally 05/13/2017)  . DEXA SCAN  Completed   Fall Risk  03/04/2017 12/08/2016  Falls in the past year? No No   Functional Status Survey:    Vitals:   07/13/17 1003  BP: 112/72  Pulse: 76  Resp: 20  Temp: 97.6 F (36.4 C)  TempSrc: Oral   There is no height or weight on file to calculate BMI. Physical Exam  Constitutional: She is oriented to person, place, and time. She appears well-developed and well-nourished.  HENT:  Head: Normocephalic.  Mouth/Throat: Oropharynx is clear and moist.  Eyes: Pupils are equal, round, and reactive to light.  Neck: Neck supple.  Cardiovascular: Normal rate and normal heart sounds.   Pulmonary/Chest: Effort normal and breath sounds normal. No respiratory distress. She has no wheezes. She has no rales.  Abdominal: Soft. Bowel sounds are normal. She exhibits no distension. There is no tenderness. There is no rebound.  Musculoskeletal:  Mild edema Bilateral.  Neurological: She is oriented to person, place, and time.  Skin: Skin is warm and dry.  Psychiatric: She has a normal mood and affect. Her behavior is normal.    Labs reviewed:  Recent Labs  07/11/17 1500 07/12/17 1952 07/13/17 0700  NA 143 142 141  K 3.8 4.0 3.6  CL 104 107 103  CO2 29 27 30   GLUCOSE 140* 140* 100*  BUN 13 16 15    CREATININE 0.86 0.73 0.76  CALCIUM 8.7* 8.8* 8.9    Recent Labs  05/19/17 0900 06/16/17 0917 07/11/17 1500  AST 15 15 21   ALT 12 13 25   ALKPHOS 78 74 52  BILITOT 0.36 0.44 0.4  PROT 7.2 6.8 6.7  ALBUMIN 3.0* 3.0* 3.2*    Recent Labs  05/19/17 0900 06/16/17 0917 07/09/17 0500 07/11/17 1500 07/12/17 1952  WBC 5.7 6.0 6.1 6.3 6.6  NEUTROABS 3.9 4.2  --  4.7  --   HGB 13.3 13.3 12.3 13.0 13.0  HCT 40.1 40.8 38.3 39.8 39.5  MCV 91.8 93.2 96.5 95.2 93.6  PLT 135* 109* 134* 120* 114*   Lab Results  Component Value Date   TSH 1.007 07/11/2017   No results found for: HGBA1C Lab Results  Component Value Date   CHOL 195 07/10/2010   HDL 54.80 07/10/2010   LDLCALC 122 (H) 07/10/2010   TRIG 92.0 07/10/2010   CHOLHDL 4 07/10/2010    Significant Diagnostic Results in last 30 days:  No results found.  Assessment/Plan  Weakness with Confusion and  urinary frequency Patient is on Keflex. Her Urine grew only 40,000 Colonies of Pseudomonas sensitives are pending. D/W daughter will continue Keflex and continue to monitor. Not sure if UTI is cause of her confusion and weakness. She did not have Chest Xray in hospital but she is completely asymptomatic.  Glioblastoma multiforme of frontal lobe  With left sided weakness Was seen by [facility]. Plan is to follow with them in 2 weeks. She is on Tapering dose of Dexamethasone. Continue Seizure Prophylaxis.  Family/ staff Communication:   Labs/tests ordered:   Total time spent in this patient care encounter was 25_ minutes; greater than 50% of the visit spent counseling patient, reviewing records , Labs and coordinating care for problems addressed at this encounter.

## 2017-07-14 LAB — URINE CULTURE

## 2017-07-16 LAB — URINE CULTURE: Culture: >=100000 — AB

## 2017-08-11 ENCOUNTER — Encounter: Payer: Self-pay | Admitting: Internal Medicine

## 2017-08-11 ENCOUNTER — Non-Acute Institutional Stay (SKILLED_NURSING_FACILITY): Payer: Medicare Other | Admitting: Internal Medicine

## 2017-08-11 DIAGNOSIS — K5900 Constipation, unspecified: Secondary | ICD-10-CM | POA: Diagnosis not present

## 2017-08-11 DIAGNOSIS — C711 Malignant neoplasm of frontal lobe: Secondary | ICD-10-CM | POA: Diagnosis not present

## 2017-08-11 DIAGNOSIS — N390 Urinary tract infection, site not specified: Secondary | ICD-10-CM | POA: Diagnosis not present

## 2017-08-11 DIAGNOSIS — R251 Tremor, unspecified: Secondary | ICD-10-CM | POA: Diagnosis not present

## 2017-08-11 NOTE — Progress Notes (Signed)
Location:   [facility] Room Number: 126/P Place of Service:  SNF (31) Provider:  Tremane Spurgeon,Davisha Linthicum  Patient, No Pcp Per  Patient Care Team: Patient, No Pcp Per as PCP - General (General Practice)  Extended Emergency Contact Information Primary Emergency Contact: Dixon,Davis Address: [address]          [place], [place] [address] [place] Phone: [phone] Mobile Phone: [phone] Relation: Spouse Secondary Emergency Contact: Edson Snowball [place] of [place] Phone: [phone] Relation: Daughter  Code Status:  Full Code Goals of care: Advanced Directive information Advanced Directives 08/11/2017  Does Patient Have a Medical Advance Directive? Yes  Type of Advance Directive (No Data)  Does patient want to make changes to medical advance directive? No - Patient declined  Would patient like information on creating a medical advance directive? No - Patient declined     Chief complaint-Donna Valentine visit for medical management of chronic medical conditions including history of glioblastoma-constipation-tremors-    HPI:  Pt is a 76 y.o. female seen today for medical management of chronic diseases.  As noted above. Patient is here for rehabilitation.  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  She did undergo a craniotomy and received radiation therapy and Temodar cycle-her last dose was in July.  She had presented with worsening weakness on the left side MRI revealed disease progression and was admitted to due for Gonvick trial---she had contusion on 06/25/2017 tolerated procedure well and is stable postop course.  Earlier this month she had increased weakness syncopal confusion and complaining urinary frequency and was treated for Pseudomonas UTI.  Currently she has no complaints she apparently has done quite well with therapy he has gained strength she is actually ambulating in a wheelchair  with her daughter in the hallway earlier today.  Vital signs are stable.  She is on Keppra for seizure prophylaxis apparently this has not been an issue during her stay here.  Her weight is stable according in nursing staff her appetite remains good.  She does have some history of constipation she is on senna she says her last bowel movement was yesterday.  She does have Zofran as needed for nausea and vomiting but apparently this is been under good control.  She continues to receive  Bevacizumab i injections at [facility] prescribed Macrobid recently for suspected UTI she will be on this through November 4 she is not really complaining of overt dysuria fever or chills today.      Past Medical History:  Diagnosis Date  . GBM (glioblastoma multiforme) ([facility] [facility])    Past Surgical History:  Procedure Laterality Date  . APPENDECTOMY  1994  . APPLICATION OF CRANIAL NAVIGATION N/A 11/28/2016   Procedure: APPLICATION OF CRANIAL NAVIGATION;  Surgeon: Ashok Pall, MD;  Location: [facility];  Service: Neurosurgery;  Laterality: N/A;  . CESAREAN SECTION     x3  . CRANIOTOMY Right 11/28/2016   Procedure: STERIOTACTIC PARIETAL CRANIOTOMY FOR RIGHT FRONTAL TUMOR RESECTION WITH BRAINLAB;  Surgeon: Ashok Pall, MD;  Location: [facility];  Service: Neurosurgery;  Laterality: Right;    Allergies  Allergen Reactions  . Penicillins Other (See Comments)    Hallucination Has patient had a PCN reaction causing immediate rash, facial/tongue/throat swelling, SOB or lightheadedness with hypotension: No Has patient had a PCN reaction causing severe rash involving mucus membranes or skin necrosis: No Has patient had a PCN reaction that required hospitalization No Has patient had a  PCN reaction occurring within the last 10 years: No If all of the above answers are "NO", then may proceed with Cephalosporin use.   . Food     Pineapple-Nausea/vomiting    Outpatient Encounter Prescriptions as of 08/11/2017    Medication Sig  . dexamethasone (DECADRON) 1 MG tablet Take 2 mg by mouth 2 (two) times daily with a meal.   . levETIRAcetam (KEPPRA) 500 MG tablet Take 1 tablet (500 mg total) by mouth 2 (two) times daily.  . nitrofurantoin, macrocrystal-monohydrate, (MACROBID) 100 MG capsule Take 100 mg by mouth 2 (two) times daily.  Marland Kitchen nystatin (NYSTATIN) powder Apply 100,000 g topically daily as needed.  . ondansetron (ZOFRAN) 8 MG tablet Take 1 tablet (8 mg total) by mouth every 8 (eight) hours as needed for nausea.  . pantoprazole (PROTONIX) 40 MG tablet Take 40 mg by mouth daily.  . primidone (MYSOLINE) 50 MG tablet Take 50 mg by mouth at bedtime.  . senna (SENOKOT) 8.6 MG TABS tablet Take 1 tablet by mouth 2 (two) times daily.  . [DISCONTINUED] acetaminophen (TYLENOL) 325 MG tablet From 07/05/2017-07/14/2017   Take 3 tablets (975 mg) by mouth every 6 hours prn for pain for up to 10 days  . [DISCONTINUED] cephALEXin (KEFLEX) 500 MG capsule Take 1 capsule (500 mg total) by mouth 4 (four) times daily.  . [DISCONTINUED] primidone (MYSOLINE) 50 MG tablet Take 1 tablet (50 mg total) by mouth 2 (two) times daily. (Patient taking differently: Take 50 mg by mouth at bedtime. )   No facility-administered encounter medications on file as of 08/11/2017.      Review of Systems  In general does not complaining of any fever or chills weight has been stable.  Skin does not complain of rashes or itching or diaphoresis.  Head ears eyes nose mouth and throat no complaints of visual changes or sore throat or difficulty swallowing.  Respiratory denies shortness breath or cough.  Cardiac denies chest pain does not really have significant lower extremity edema.  GI is not complaining at this time of nausea vomiting diarrhea or constipation has had some constipation in the past says her last bowel movement was yesterday.  GU is being treated for UTI but does not complain of overt dysuria.  Musculoskeletal does not  complain of joint pain continued to have some left-sided weakness compared to the right.  Neurologic as noted above does not complain of dizziness headache or syncope.  Psych does not complain of depression or anxiety appears to be in good spirits nursing staff does not report any behaviors   Immunization History  Administered Date(s) Administered  . Influenza Whole 07/25/2009  . Influenza-Unspecified 07/30/2017  . Pneumococcal Conjugate-13 07/30/2017  . Pneumococcal Polysaccharide-23 07/28/2008   Pertinent  Health Maintenance Due  Topic Date Due  . MAMMOGRAM  09/11/2017 (Originally 01/28/2015)  . COLONOSCOPY  09/11/2017 (Originally 09/03/2014)  . INFLUENZA VACCINE  Completed  . DEXA SCAN  Completed  . PNA vac Low Risk Adult  Completed   Fall Risk  03/04/2017 12/08/2016  Falls in the past year? No No   Functional Status Survey:    Vitals:   08/11/17 1347  BP: 128/80  Pulse: 69  Resp: 18  Temp: 98.5 F (36.9 C)  TempSrc: Oral  SpO2: 94%  Weight: 182 lb 9.6 oz (82.8 kg)  Height: 5\' 5"  (1.651 m)   Body mass index is 30.39 kg/m. Physical Exam In general this is a very pleasant elderly female in no distress  lying comfortably in bed.  She had been up the wheelchair earlier today.  Her skin is warm and dry.  Eyes pupils appear reactive light sclera and Clear visual acuity is intact.  Oropharynx is clear mucous membranes moist.  Chest is clear to auscultation there is no labored breathing.  Heart is regular rate and rhythm without murmur gallop or rub she has minimal lower extremity edema.  Abdomen is soft somewhat obese nontender with active bowel sounds.  Musculoskeletal is able to move all extremities 4 has somewhat reduced strength on the left side more prominent in her left upper extremity compared to the right -- strength in left upper extremity is 3-4 out of 5  Neurologic as noted above has some mild left-sided weakness or speech is clear.  Psych she is  alert and oriented. Pleasant and appropriate   Labs reviewed:  Recent Labs  07/11/17 1500 07/12/17 1952 07/13/17 0700  NA 143 142 141  K 3.8 4.0 3.6  CL 104 107 103  CO2 29 27 30   GLUCOSE 140* 140* 100*  BUN 13 16 15   CREATININE 0.86 0.73 0.76  CALCIUM 8.7* 8.8* 8.9    Recent Labs  05/19/17 0900 06/16/17 0917 07/11/17 1500  AST 15 15 21   ALT 12 13 25   ALKPHOS 78 74 52  BILITOT 0.36 0.44 0.4  PROT 7.2 6.8 6.7  ALBUMIN 3.0* 3.0* 3.2*    Recent Labs  05/19/17 0900 06/16/17 0917 07/09/17 0500 07/11/17 1500 07/12/17 1952  WBC 5.7 6.0 6.1 6.3 6.6  NEUTROABS 3.9 4.2  --  4.7  --   HGB 13.3 13.3 12.3 13.0 13.0  HCT 40.1 40.8 38.3 39.8 39.5  MCV 91.8 93.2 96.5 95.2 93.6  PLT 135* 109* 134* 120* 114*   Lab Results  Component Value Date   TSH 1.007 07/11/2017   No results found for: HGBA1C Lab Results  Component Value Date   CHOL 195 07/10/2010   HDL 54.80 07/10/2010   LDLCALC 122 (H) 07/10/2010   TRIG 92.0 07/10/2010   CHOLHDL 4 07/10/2010    Significant Diagnostic Results in last 30 days:  No results found.  Assessment/Plan   #1-history of glioblastoma-status post craniotomy she continues on dexamethasone-she is again receiving periodic injections    At [facility]  as well -- as noted above clinically she appears to be doing well with supportive care    #2 history of tremors this appears stabilized on primidone.  #3 seizure prophylaxis continues on Keppra this has been stable.for extended period time.  #4 history of constipation while she is  Senna  #5 UTI again this has been followed apparently at [facility] she was prescribed Macrobid through November 4 appears to be stable at this point does not appear overtly symptomatic     [id number]

## 2017-08-13 ENCOUNTER — Other Ambulatory Visit: Payer: Self-pay | Admitting: Radiation Therapy

## 2017-08-13 ENCOUNTER — Inpatient Hospital Stay
Admit: 2017-08-13 | Discharge: 2017-08-13 | Disposition: A | Discharge status: Home / Self Care | Payer: Self-pay | Attending: Internal Medicine | Admitting: Internal Medicine

## 2017-08-13 DIAGNOSIS — C711 Malignant neoplasm of frontal lobe: Secondary | ICD-10-CM

## 2017-08-14 ENCOUNTER — Ambulatory Visit (HOSPITAL_COMMUNITY)
Admit: 2017-08-14 | Discharge: 2017-08-14 | Disposition: A | Discharge status: Home / Self Care | Payer: Medicare Other | Attending: Internal Medicine | Admitting: Internal Medicine

## 2017-08-14 ENCOUNTER — Encounter: Payer: Self-pay | Admitting: Internal Medicine

## 2017-08-14 ENCOUNTER — Non-Acute Institutional Stay (SKILLED_NURSING_FACILITY): Payer: Medicare Other | Admitting: Internal Medicine

## 2017-08-14 DIAGNOSIS — M7989 Other specified soft tissue disorders: Secondary | ICD-10-CM | POA: Insufficient documentation

## 2017-08-14 DIAGNOSIS — I824Z3 Acute embolism and thrombosis of unspecified deep veins of distal lower extremity, bilateral: Secondary | ICD-10-CM | POA: Insufficient documentation

## 2017-08-14 NOTE — Progress Notes (Signed)
Location:   [place] Room Number: 126/P Place of Service:  SNF (31) Provider:  Granville Lewis  Patient, No Pcp Per  Patient Care Team: Patient, No Pcp Per as PCP - General (General Practice)  Extended Emergency Contact Information Primary Emergency Contact: Dixon,Davis Address: [address]          [place], [place] [address] [place] Phone: [phone] Mobile Phone: [phone] Relation: Spouse Secondary Emergency Contact: Edson Snowball [place] Phone: [phone] Relation: Daughter  Code Status:  Full Code Goals of care: Advanced Directive information Advanced Directives 08/14/2017  Does Patient Have a Medical Advance Directive? Yes  Type of Advance Directive (No Data)  Does patient want to make changes to medical advance directive? No - Patient declined  Would patient like information on creating a medical advance directive? No - Patient declined     Chief Complaint  Patient presents with  . Acute Visit    Left leg edema    HPI:  Pt is a 76 y.o. female seen today for an acute visit for Increase left leg edema.  Apparently this was noted by family today-patient is complaining of tightness in the left calf-she does not complain any fever or chills shortness of breath-vital signs appear to be stable.  Patient is here for rehabilitation after hospitalization for right frontal glioblastoma diagnosed in February 2018.  She is status post craniotomy and has received radiation therapy and Temodor cycle.  She presented with worsening weakness on the left side MRI did reveal disease progression and she was admitted for a Medicenna trial-which she tolerated well.  She's also been treated for a pseudomonas UTI because she had increased confusion and weakness recently.  She continues on dexamethasone and is followed closely by physicians at [facility] where she receives Bevacizumab injections-she is also  being treated for UTI with Macrobid which was prescribed by the physicians.  Again e today she was noted to have some increased edema of her left leg she is complaining of tightness and I'm following up on this.       Past Medical History:  Diagnosis Date  . GBM (glioblastoma multiforme) ([place])    Past Surgical History:  Procedure Laterality Date  . APPENDECTOMY  1994  . APPLICATION OF CRANIAL NAVIGATION N/A 11/28/2016   Procedure: APPLICATION OF CRANIAL NAVIGATION;  Surgeon: Ashok Pall, MD;  Location: [place];  Service: Neurosurgery;  Laterality: N/A;  . CESAREAN SECTION     x3  . CRANIOTOMY Right 11/28/2016   Procedure: STERIOTACTIC PARIETAL CRANIOTOMY FOR RIGHT FRONTAL TUMOR RESECTION WITH BRAINLAB;  Surgeon: Ashok Pall, MD;  Location: [place];  Service: Neurosurgery;  Laterality: Right;    Allergies  Allergen Reactions  . Penicillins Other (See Comments)    Hallucination Has patient had a PCN reaction causing immediate rash, facial/tongue/throat swelling, SOB or lightheadedness with hypotension: No Has patient had a PCN reaction causing severe rash involving mucus membranes or skin necrosis: No Has patient had a PCN reaction that required hospitalization No Has patient had a PCN reaction occurring within the last 10 years: No If all of the above answers are "NO", then may proceed with Cephalosporin use.   . Food     Pineapple-Nausea/vomiting    Outpatient Encounter Prescriptions as of 08/14/2017  Medication Sig  . dexamethasone (DECADRON) 1 MG tablet Take 2 mg by mouth 2 (two) times daily with a meal.   . levETIRAcetam (KEPPRA) 500  MG tablet Take 1 tablet (500 mg total) by mouth 2 (two) times daily.  . nitrofurantoin, macrocrystal-monohydrate, (MACROBID) 100 MG capsule Take 100 mg by mouth 2 (two) times daily.  Marland Kitchen nystatin (NYSTATIN) powder Apply 100,000 g topically daily as needed.  . ondansetron (ZOFRAN) 8 MG tablet Take 1 tablet (8 mg total) by mouth every 8 (eight)  hours as needed for nausea.  . pantoprazole (PROTONIX) 40 MG tablet Take 40 mg by mouth daily.  . primidone (MYSOLINE) 50 MG tablet Take 50 mg by mouth at bedtime.  . senna (SENOKOT) 8.6 MG TABS tablet Take 1 tablet by mouth 2 (two) times daily.   No facility-administered encounter medications on file as of 08/14/2017.     Review of Systems   In general she does not complaining any fever or chills or shortness of breath.  Weight has been relatively stable.  Skin does not complain of rashes or itching or increased erythema.  Head ears eyes nose mouth is not complaining of any sore throat or visual changes.  Respiratory denies shortness of breath or cough.  Cardiac denies chest pain has had some increased edema most prominently of her left leg.  GI is not complaining of any abdominal pain nausea vomiting diarrhea or constipation-.  GU is completing treatment for UTI does not really complain of dysuria at this time  Musculoskeletal is not complaining of joint pain currently does have left-sided weakness.  Neurologic is mobile continues to have some left-sided weakness is not complaining of dizziness or headache syncope at this time.  Psych does not complain of overt depression or anxiety continues to be in good spirits with positive attitude  Immunization History  Administered Date(s) Administered  . Influenza Whole 07/25/2009  . Influenza-Unspecified 07/30/2017  . Pneumococcal Conjugate-13 07/30/2017  . Pneumococcal Polysaccharide-23 07/28/2008   Pertinent  Health Maintenance Due  Topic Date Due  . MAMMOGRAM  09/11/2017 (Originally 01/28/2015)  . COLONOSCOPY  09/11/2017 (Originally 09/03/2014)  . INFLUENZA VACCINE  Completed  . DEXA SCAN  Completed  . PNA vac Low Risk Adult  Completed   Fall Risk  03/04/2017 12/08/2016  Falls in the past year? No No   Functional Status Survey:    She is afebrile pulse is 80 respirations of 18 blood pressure 140/80  Physical Exam  In  general this is a pleasant elderly female in no distress sitting comfortably in her wheelchair.  Her skin is warm and dry do not note increased erythema of her legs.  Eyes visual acuity appears grossly intact sclera and conjunctiva are clear.  Oropharynx clear mucous membranes moist.  Chest is clear to auscultation there is no labored breathing.  Heart is regular rate and rhythm without murmur gallop or rub she has increased edema of her left leg I would say one plus somewhat less on the right leg she does have some tightness of her left calf with slight tenderness there is no erythema or increased warmth.  Abdomen is soft nontender with positive bowel sounds.  Musculoskeletal is able to move all extremities 4 with some reduced strength on the left side especially her left arm which is not new.  Neurologic as noted above cranial nerves appear to be intact her speech is clear.  Psych she is alert and oriented pleasant and appropriate  Labs reviewed:  Recent Labs  07/11/17 1500 07/12/17 1952 07/13/17 0700  NA 143 142 141  K 3.8 4.0 3.6  CL 104 107 103  CO2 29 27 30  GLUCOSE 140* 140* 100*  BUN 13 16 15   CREATININE 0.86 0.73 0.76  CALCIUM 8.7* 8.8* 8.9    Recent Labs  05/19/17 0900 06/16/17 0917 07/11/17 1500  AST 15 15 21   ALT 12 13 25   ALKPHOS 78 74 52  BILITOT 0.36 0.44 0.4  PROT 7.2 6.8 6.7  ALBUMIN 3.0* 3.0* 3.2*    Recent Labs  05/19/17 0900 06/16/17 0917 07/09/17 0500 07/11/17 1500 07/12/17 1952  WBC 5.7 6.0 6.1 6.3 6.6  NEUTROABS 3.9 4.2  --  4.7  --   HGB 13.3 13.3 12.3 13.0 13.0  HCT 40.1 40.8 38.3 39.8 39.5  MCV 91.8 93.2 96.5 95.2 93.6  PLT 135* 109* 134* 120* 114*   Lab Results  Component Value Date   TSH 1.007 07/11/2017   No results found for: HGBA1C Lab Results  Component Value Date   CHOL 195 07/10/2010   HDL 54.80 07/10/2010   LDLCALC 122 (H) 07/10/2010   TRIG 92.0 07/10/2010   CHOLHDL 4 07/10/2010    Significant Diagnostic  Results in last 30 days:  No results found.  Assessment/Plan  #1-history of increased edema especially left leg will order venous Dopplers of both legs to rule out DVT.   #2 History of glioblastoma she is status post craniotomy continues on dexamethasone and again is receiving injections as well at [facility] this point appears to be stable supportive care.  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 Lyndel Safe via phone and will start her on Eliquis 10 mg twice a day for 7 days and then reduce down to 5 mg twice a day.  Also with her history of mild thrombocytopenia will update labs tomorrow this will need to be monitored periodically.  Clinically she appears to be stable and is not showing any sign of acute pain or respiratory issues but will have to be watched.  Her family also has contacted the physicians at [facility] for their input.  .  [phone]

## 2017-08-15 ENCOUNTER — Encounter (HOSPITAL_COMMUNITY)
Admission: RE | Admit: 2017-08-15 | Discharge: 2017-08-15 | Disposition: A | Discharge status: Home / Self Care | Payer: Medicare Other | Source: Skilled Nursing Facility | Attending: Internal Medicine | Admitting: Internal Medicine

## 2017-08-15 DIAGNOSIS — Z48811 Encounter for surgical aftercare following surgery on the nervous system: Secondary | ICD-10-CM | POA: Insufficient documentation

## 2017-08-15 DIAGNOSIS — N390 Urinary tract infection, site not specified: Secondary | ICD-10-CM | POA: Insufficient documentation

## 2017-08-15 DIAGNOSIS — E876 Hypokalemia: Secondary | ICD-10-CM | POA: Insufficient documentation

## 2017-08-15 LAB — BASIC METABOLIC PANEL
ANION GAP: 9 (ref 5–15)
BUN: 23 mg/dL — ABNORMAL HIGH (ref 6–20)
CO2: 27 mmol/L (ref 22–32)
Calcium: 8.8 mg/dL — ABNORMAL LOW (ref 8.9–10.3)
Chloride: 104 mmol/L (ref 101–111)
Creatinine, Ser: 0.67 mg/dL (ref 0.44–1.00)
GFR calc non Af Amer: >60 mL/min (ref >60)
Glucose, Bld: 150 mg/dL — ABNORMAL HIGH (ref 65–99)
POTASSIUM: 4.2 mmol/L (ref 3.5–5.1)
Sodium: 140 mmol/L (ref 135–145)

## 2017-08-15 LAB — CBC WITH DIFFERENTIAL/PLATELET
Basophils Absolute: 0 10*3/uL (ref 0.0–0.1)
Basophils Relative: 0 %
Eosinophils Absolute: 0 10*3/uL (ref 0.0–0.7)
Eosinophils Relative: 0 %
HEMATOCRIT: 45.1 % (ref 36.0–46.0)
HEMOGLOBIN: 15.1 g/dL — AB (ref 12.0–15.0)
LYMPHS ABS: 1 10*3/uL (ref 0.7–4.0)
Lymphocytes Relative: 13 %
MCH: 31.7 pg (ref 26.0–34.0)
MCHC: 33.5 g/dL (ref 30.0–36.0)
MCV: 94.7 fL (ref 78.0–100.0)
MONOS PCT: 4 %
Monocytes Absolute: 0.3 10*3/uL (ref 0.1–1.0)
NEUTROS ABS: 6.9 10*3/uL (ref 1.7–7.7)
NEUTROS PCT: 84 %
Platelets: 95 10*3/uL — ABNORMAL LOW (ref 150–400)
RBC: 4.76 MIL/uL (ref 3.87–5.11)
RDW: 14.1 % (ref 11.5–15.5)
WBC: 8.2 10*3/uL (ref 4.0–10.5)

## 2017-08-16 ENCOUNTER — Encounter (HOSPITAL_COMMUNITY)
Admission: RE | Admit: 2017-08-16 | Discharge: 2017-08-16 | Disposition: A | Discharge status: Home / Self Care | Payer: Medicare Other

## 2017-08-16 LAB — URINALYSIS, ROUTINE W REFLEX MICROSCOPIC
BACTERIA UA: NONE SEEN
Bilirubin Urine: NEGATIVE
GLUCOSE, UA: 150 mg/dL — AB
Ketones, ur: NEGATIVE mg/dL
Nitrite: NEGATIVE
PROTEIN: NEGATIVE mg/dL
Specific Gravity, Urine: 1.006 (ref 1.005–1.030)
pH: 5 (ref 5.0–8.0)

## 2017-08-17 ENCOUNTER — Non-Acute Institutional Stay (SKILLED_NURSING_FACILITY): Payer: Medicare Other | Admitting: Internal Medicine

## 2017-08-17 ENCOUNTER — Other Ambulatory Visit: Payer: Medicare Other

## 2017-08-17 ENCOUNTER — Telehealth: Payer: Self-pay | Admitting: Internal Medicine

## 2017-08-17 ENCOUNTER — Ambulatory Visit: Payer: Medicare Other | Admitting: Internal Medicine

## 2017-08-17 ENCOUNTER — Ambulatory Visit: Payer: Medicare Other | Admitting: Hematology

## 2017-08-17 ENCOUNTER — Encounter: Payer: Self-pay | Admitting: Internal Medicine

## 2017-08-17 DIAGNOSIS — I824Z3 Acute embolism and thrombosis of unspecified deep veins of distal lower extremity, bilateral: Secondary | ICD-10-CM | POA: Diagnosis not present

## 2017-08-17 DIAGNOSIS — R609 Edema, unspecified: Secondary | ICD-10-CM

## 2017-08-17 DIAGNOSIS — R251 Tremor, unspecified: Secondary | ICD-10-CM | POA: Diagnosis not present

## 2017-08-17 DIAGNOSIS — D696 Thrombocytopenia, unspecified: Secondary | ICD-10-CM | POA: Diagnosis not present

## 2017-08-17 NOTE — Telephone Encounter (Signed)
Left voicemail for patient regarding appt added per 11/5 sch msg.

## 2017-08-17 NOTE — Progress Notes (Signed)
Location:   [place] Room Number: 126/P Place of Service:  SNF (31) Provider:  Granville Lewis  Patient, No Pcp Per  Patient Care Team: Patient, No Pcp Per as PCP - General (General Practice)  Extended Emergency Contact Information Primary Emergency Contact: Dixon,Davis Address: [address]          [place], [place] [address] [place] Phone: [phone] Mobile Phone: [phone] Relation: Spouse Secondary Emergency Contact: Edson Snowball [place] Phone: [phone] Relation: Daughter  Code Status:  Full Code Goals of care: Advanced Directive information Advanced Directives 08/17/2017  Does Patient Have a Medical Advance Directive? Yes  Type of Advance Directive Out of facility DNR (pink MOST or yellow form)  Does patient want to make changes to medical advance directive? No - Patient declined  Would patient like information on creating a medical advance directive? No - Patient declined     Chief Complaint  Patient presents with  . Acute Visit    F/U DVT and WT. Gain    HPI:  Pt is a 76 y.o. 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  She was noted to have left-sided weakness MRI showed disease progressihe was admitted for a  Medicenna trial -again she has been  Here for rehabilitation.  Last Friday she was noted to have some increased edema more so over left leg we ordered bilateral Dopplers which actually showedbilateral calf vein DVT at the level of the posterior tibial --peroneal and anterior tibial veins  She has been started on Eliquis  --10 mg twice a dayand then down to 5 mg twice a day.  Nursing today noted that she has gained about 5 pounds in the past  week.  I am following up on this.  I did discuss this with her daughter who states that at [facility] they did a cardiac echo that apparently was unremarkable.  She is noton a diuretic-she is  Not complaining of any increased shortness of breath.  She appears to be participat-but continues to have some increased lower extremity edema.         Past Medical History:  Diagnosis Date  . GBM (glioblastoma multiforme) ([place])    Past Surgical History:  Procedure Laterality Date  . APPENDECTOMY  1994  . CESAREAN SECTION     x3    Allergies  Allergen Reactions  . Penicillins Other (See Comments)    Hallucination Has patient had a PCN reaction causing immediate rash, facial/tongue/throat swelling, SOB or lightheadedness with hypotension: No Has patient had a PCN reaction causing severe rash involving mucus membranes or skin necrosis: No Has patient had a PCN reaction that required hospitalization No Has patient had a PCN reaction occurring within the last 10 years: No If all of the above answers are "NO", then may proceed with Cephalosporin use.   . Food     Pineapple-Nausea/vomiting    Outpatient Encounter Medications as of 08/17/2017  Medication Sig  . apixaban (ELIQUIS) 5 MG TABS tablet Take 2 tablets 10 mg by mouth twice a day for  10 days then take 1 tablet  mg by mouth twice a day  . dexamethasone (DECADRON) 1 MG tablet Take 2 mg by mouth 2 (two) times  daily with a meal.   . levETIRAcetam (KEPPRA) 500 MG tablet Take 1 tablet (500 mg total) by mouth 2 (two) times daily.  Marland Kitchen nystatin (NYSTATIN) powder Apply 100,000 g topically daily as needed.  . ondansetron (ZOFRAN) 8 MG tablet Take 1 tablet (8 mg total) by mouth every 8 (eight) hours as needed for nausea.  . pantoprazole (PROTONIX) 40 MG tablet Take 40 mg by mouth daily.  . primidone (MYSOLINE) 50 MG tablet Take 50 mg by mouth at bedtime.  . senna (SENOKOT) 8.6 MG TABS tablet Take 1 tablet by mouth 2 (two) times daily.  .  [DISCONTINUED] nitrofurantoin, macrocrystal-monohydrate, (MACROBID) 100 MG capsule Take 100 mg by mouth 2 (two) times daily.   No facility-administered encounter medications on file as of 08/17/2017.     Review of Systems   In general she is not complaining of any fever or chills has gained some weight per scales.  Skin does not complain of rashtching or diaphoresis.  Head ears eyes nose mout throat doest complaining of sore throat or visual changes  Respiratory denies shortness of breathnor cough   cardiac-denies chest pain has some lower extremity edema.  GI continues to have a good appetite denies abdominal painnausea vomiting diarrhea constipation.  gU had complain of somepossible dysuria yesterday urine culture is pending.  Musculoskeletal is not complaining of joint pain currently.  Neurologic is not complaining of dizziness headache or numbness.  Psychcontinues to be in good  spirits does not complaining of anxiety or depression      Immunization History  Administered Date(s) Administered  . Influenza Whole 07/25/2009  . Influenza-Unspecified 07/30/2017  . Pneumococcal Conjugate-13 07/30/2017  . Pneumococcal Polysaccharide-23 07/28/2008   Pertinent  Health Maintenance Due  Topic Date Due  . MAMMOGRAM  09/11/2017 (Originally 01/28/2015)  . COLONOSCOPY  09/11/2017 (Originally 09/03/2014)  . INFLUENZA VACCINE  Completed  . DEXA SCAN  Completed  . PNA vac Low Risk Adult  Completed   Fall Risk  03/04/2017 12/08/2016  Falls in the past year? No No   Functional Status Survey:    She is afebrile pulse of 80 respirations o blood pressure80 manually weight 187 today  Physical Exam  In general  This continues to be a very pleasant elderly female in no distress sitting comfortably in her wheelchair  Her skin is warm and dry.  Eyes visual acuity appears grossly intact sclera and conjunctiva are clear.  Chest is clear to auscultation there is no labored  breathing.  Heart is regular rate an rhythm withur gallop orshe continues to havased edema of legs1+ I would say.  Pedal pulses are intact edema is coolnontender flesh-colored.  Abdomen is sth positive bowel sounds.  Musculoskeletalhe is able to move all extrem 4 has a history of some left-sided deficits more so her left arm.  Neurologic as noted a cranial nerves appear to be intact her speech is clear. She has a resting tremor of her left arm today  Psych she is alerr  oriented very pleasant and appropriate   Labs reviewed: Recent Labs    07/12/17 1952 07/13/17 0700 08/15/17 1340  NA 142 141 140  K 4.0 3.6 4.2  CL 107 103 104  CO2 27 30 27   GLUCOSE 140* 100* 150*  BUN 16 15 23*  CREATININE 0.73 0.76 0.67  CALCIUM 8.8* 8.9 8.8*   Recent Labs    05/19/17 0900 06/16/17 0917 07/11/17 1500  AST 15 15 21   ALT 12 13 25   ALKPHOS  78 74 52  BILITOT 0.36 0.44 0.4  PROT 7.2 6.8 6.7  ALBUMIN 3.0* 3.0* 3.2*   Recent Labs    06/16/17 0917  07/11/17 1500 07/12/17 1952 08/15/17 1340  WBC 6.0   < > 6.3 6.6 8.2  NEUTROABS 4.2  --  4.7  --  6.9  HGB 13.3   < > 13.0 13.0 15.1*  HCT 40.8   < > 39.8 39.5 45.1  MCV 93.2   < > 95.2 93.6 94.7  PLT 109*   < > 120* 114* 95*   < > = values in this interval not displayed.   Lab Results  Component Value Date   TSH 1.007 07/11/2017   No results found for: HGBA1C Lab Results  Component Value Date   CHOL 195 07/10/2010   HDL 54.80 07/10/2010   LDLCALC 122 (H) 07/10/2010   TRIG 92.0 07/10/2010   CHOLHDL 4 07/10/2010    Significant Diagnostic Results in last 30 days:  US Venous Img Lower Bilateral  Result Date: 08/14/2017 CLINICAL DATA:  Bilateral lower extremity edema. EXAM: BILATERAL LOWER EXTREMITY VENOUS DOPPLER ULTRASOUND TECHNIQUE: Gray-scale sonography with graded compression, as well as color Doppler and duplex ultrasound were performed to evaluate the lower extremity deep venous systems from the level of the common  femoral vein and including the common femoral, femoral, profunda femoral, popliteal and calf veins including the posterior tibial, peroneal and gastrocnemius veins when visible. The superficial great saphenous vein was also interrogated. Spectral Doppler was utilized to evaluate flow at rest and with distal augmentation maneuvers in the common femoral, femoral and popliteal veins. COMPARISON:  None. FINDINGS: RIGHT LOWER EXTREMITY Common Femoral Vein: No evidence of thrombus. Normal compressibility, respiratory phasicity and response to augmentation. Saphenofemoral Junction: No evidence of thrombus. Normal compressibility and flow on color Doppler imaging. Profunda Femoral Vein: No evidence of thrombus. Normal compressibility and flow on color Doppler imaging. Femoral Vein: No evidence of thrombus. Normal compressibility, respiratory phasicity and response to augmentation. Popliteal Vein: No evidence of thrombus. Normal compressibility, respiratory phasicity and response to augmentation. Calf Veins: Occlusive thrombus is identified in the posterior tibial vein, peroneal vein and anterior tibial vein. Superficial Great Saphenous Vein: No evidence of thrombus. Normal compressibility. Venous Reflux:  None. Other Findings: No evidence of superficial thrombophlebitis or abnormal fluid collection. LEFT LOWER EXTREMITY Common Femoral Vein: No evidence of thrombus. Normal compressibility, respiratory phasicity and response to augmentation. Saphenofemoral Junction: No evidence of thrombus. Normal compressibility and flow on color Doppler imaging. Profunda Femoral Vein: No evidence of thrombus. Normal compressibility and flow on color Doppler imaging. Femoral Vein: No evidence of thrombus. Normal compressibility, respiratory phasicity and response to augmentation. Popliteal Vein: No evidence of thrombus. Normal compressibility, respiratory phasicity and response to augmentation. Calf Veins: Occlusive thrombus is identified in  the posterior tibial vein, peroneal vein and anterior tibial vein. Superficial Great Saphenous Vein: No evidence of thrombus. Normal compressibility. Venous Reflux:  None. Other Findings: No evidence of superficial thrombophlebitis or abnormal fluid collection. IMPRESSION: Bilateral calf vein DVT at the level of posterior tibial, peroneal and anterior tibial veins. Electronically Signed   By: Aletta Edouard M.D.   On: 08/14/2017 16:27    Assessment/Plan  #1-ncreased edemawith weight gain-at this point we'll continue to monitor and update a BNP tomorrow-per discussion with daughter cardiac echo was unremarkable at [facility] ---but will await BNP Before any other studies--also willupdate albumin--ws 3.2 in September 2019  Also leg elevation will be encouraged  This was discussed  with Dr. 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   3 history of tremors continues on Primidone--this appears stable she has left arm tremor today according to patient and family this is intermittent and has not worsened   [id number]-of note greater than 25 minutes spent assessing patient-discussing her status with nursing staff as well as with her daughter  And husband at bedside-as well as with Dr. Corrinne Eagle formulating a plan of care-of note greater than 50% of time spent coordinating plan of care with input as noted above

## 2017-08-18 ENCOUNTER — Other Ambulatory Visit: Payer: Medicare Other

## 2017-08-18 ENCOUNTER — Encounter (HOSPITAL_COMMUNITY)
Admission: RE | Admit: 2017-08-18 | Discharge: 2017-08-18 | Disposition: A | Discharge status: Home / Self Care | Payer: Medicare Other | Source: Skilled Nursing Facility | Attending: Internal Medicine | Admitting: Internal Medicine

## 2017-08-18 ENCOUNTER — Ambulatory Visit: Payer: Medicare Other | Admitting: Internal Medicine

## 2017-08-18 DIAGNOSIS — Z48811 Encounter for surgical aftercare following surgery on the nervous system: Secondary | ICD-10-CM | POA: Insufficient documentation

## 2017-08-18 DIAGNOSIS — C711 Malignant neoplasm of frontal lobe: Secondary | ICD-10-CM | POA: Insufficient documentation

## 2017-08-18 DIAGNOSIS — B965 Pseudomonas (aeruginosa) (mallei) (pseudomallei) as the cause of diseases classified elsewhere: Secondary | ICD-10-CM | POA: Insufficient documentation

## 2017-08-18 LAB — CBC WITH DIFFERENTIAL/PLATELET
BASOS ABS: 0 10*3/uL (ref 0.0–0.1)
BASOS PCT: 0 %
EOS ABS: 0 10*3/uL (ref 0.0–0.7)
Eosinophils Relative: 0 %
HCT: 43 % (ref 36.0–46.0)
HEMOGLOBIN: 13.9 g/dL (ref 12.0–15.0)
Lymphocytes Relative: 14 %
Lymphs Abs: 0.9 10*3/uL (ref 0.7–4.0)
MCH: 31 pg (ref 26.0–34.0)
MCHC: 32.3 g/dL (ref 30.0–36.0)
MCV: 96 fL (ref 78.0–100.0)
MONO ABS: 0.4 10*3/uL (ref 0.1–1.0)
MONOS PCT: 6 %
NEUTROS ABS: 4.8 10*3/uL (ref 1.7–7.7)
NEUTROS PCT: 79 %
Platelets: 92 10*3/uL — ABNORMAL LOW (ref 150–400)
RBC: 4.48 MIL/uL (ref 3.87–5.11)
RDW: 14.4 % (ref 11.5–15.5)
WBC: 6.1 10*3/uL (ref 4.0–10.5)

## 2017-08-18 LAB — ALBUMIN: ALBUMIN: 3.2 g/dL — AB (ref 3.5–5.0)

## 2017-08-18 LAB — URINE CULTURE

## 2017-08-18 LAB — BRAIN NATRIURETIC PEPTIDE: B NATRIURETIC PEPTIDE 5: 84 pg/mL (ref 0.0–100.0)

## 2017-08-28 ENCOUNTER — Non-Acute Institutional Stay (SKILLED_NURSING_FACILITY): Payer: Medicare Other | Admitting: Internal Medicine

## 2017-08-28 ENCOUNTER — Encounter (HOSPITAL_COMMUNITY)
Admission: RE | Admit: 2017-08-28 | Discharge: 2017-08-28 | Disposition: A | Discharge status: Home / Self Care | Payer: Medicare Other | Source: Skilled Nursing Facility | Attending: *Deleted | Admitting: *Deleted

## 2017-08-28 DIAGNOSIS — N390 Urinary tract infection, site not specified: Secondary | ICD-10-CM | POA: Diagnosis not present

## 2017-08-28 DIAGNOSIS — Z86718 Personal history of other venous thrombosis and embolism: Secondary | ICD-10-CM | POA: Diagnosis not present

## 2017-08-28 DIAGNOSIS — C711 Malignant neoplasm of frontal lobe: Secondary | ICD-10-CM | POA: Diagnosis not present

## 2017-08-28 DIAGNOSIS — R609 Edema, unspecified: Secondary | ICD-10-CM | POA: Diagnosis not present

## 2017-08-28 DIAGNOSIS — R251 Tremor, unspecified: Secondary | ICD-10-CM | POA: Diagnosis not present

## 2017-08-28 LAB — CBC WITH DIFFERENTIAL/PLATELET
Basophils Absolute: 0 10*3/uL (ref 0.0–0.1)
Basophils Relative: 0 %
EOS ABS: 0 10*3/uL (ref 0.0–0.7)
EOS PCT: 0 %
HCT: 45.8 % (ref 36.0–46.0)
Hemoglobin: 14.8 g/dL (ref 12.0–15.0)
LYMPHS ABS: 0.8 10*3/uL (ref 0.7–4.0)
LYMPHS PCT: 13 %
MCH: 30.8 pg (ref 26.0–34.0)
MCHC: 32.3 g/dL (ref 30.0–36.0)
MCV: 95.2 fL (ref 78.0–100.0)
MONO ABS: 0.3 10*3/uL (ref 0.1–1.0)
MONOS PCT: 5 %
Neutro Abs: 5 10*3/uL (ref 1.7–7.7)
Neutrophils Relative %: 83 %
PLATELETS: 121 10*3/uL — AB (ref 150–400)
RBC: 4.81 MIL/uL (ref 3.87–5.11)
RDW: 14.1 % (ref 11.5–15.5)
WBC: 6 10*3/uL (ref 4.0–10.5)

## 2017-08-28 LAB — BASIC METABOLIC PANEL
Anion gap: 7 (ref 5–15)
BUN: 22 mg/dL — ABNORMAL HIGH (ref 6–20)
CHLORIDE: 105 mmol/L (ref 101–111)
CO2: 28 mmol/L (ref 22–32)
CREATININE: 0.64 mg/dL (ref 0.44–1.00)
Calcium: 8.7 mg/dL — ABNORMAL LOW (ref 8.9–10.3)
GFR calc Af Amer: >60 mL/min (ref >60)
GFR calc non Af Amer: >60 mL/min (ref >60)
GLUCOSE: 161 mg/dL — AB (ref 65–99)
POTASSIUM: 3.7 mmol/L (ref 3.5–5.1)
SODIUM: 140 mmol/L (ref 135–145)

## 2017-08-28 NOTE — Progress Notes (Signed)
This is a discharge note.  Level of CARE skilled.  Facility is [facility].  Chief complaint discharge note  History of present illness.  Patient is a pleasant 76 year old female--seen today for discharge from facility tomorrow.  She was here for short-term rehab.  Recently she was diagnosed with a right frontal glioblastoma in February 2018- she underwent a craniotomy and received radiation therapy and Temodar cycle at [facility] she then presented with worsening weakness on the left side MRI did show disease progression she was admitted for a Medicenna  Trial--she received an infusion on June 25, 2017 tolerated procedure well and was stable postop.  Is also been treated recently for Pseudomonas UTI after complaining of weakness syncope and confusion.  Her stay here has been complicated by increased leg swelling she was found to have bilateral calf DVTs--level of the posterior tibial-peroneal and anterior tibial veins --she was started on Eliquis 10 mg twice daily this  has been reduced to 5 mg twice daily.  Otherwise her stay here has been fairly unremarkable she has made some progress with therapy but will need continued PT and OT at home as well as nursing support and CNA to help with her activities of daily living.  At one point she had been placed on Macrobid apparently for a possible UTI-this was prescribed at [facility]- she did complete a course of this and we did do a urine culture back on November 4 which did not grow out anything significant.  She has completed another round of Macrobid she was prescribed by [facility]-this was discontinued a couple days ago--and will recheck a urine culture she does not appear to be overtly symptomatic.  She continues to have a history of tremor she is on primidone though dose was reduced secondary to interaction with the Eliquis.  She also continues on Keppra for seizure prophylaxis but this is not really been an issue during her stay here.  She  does have a history of thrombocytopenia this appears relatively stable with platelet ranging from around 90,000 up to the low 100s- it was 92,000 on lab done 10 days ago we will update this before discharge as well especially since she is on Eliquis.  She continues to have some lower extremity edema-we did do lab work including a BNP which did not appear to be remarkable-she is not complaining of any shortness of breath cough or chest pain- we have encouraged leg elevation the--weight appears to be relatively stable the past couple weeks in the high 180s-1  With currently she has no complaints vital signs are stable she is eating lunch with his family she is looking forward to going home she will be at home with her spouse.  She will need a bedside commode which has already been delivered-will update again her blood work before discharge as well    Past Medical History:  Diagnosis Date  . GBM (glioblastoma multiforme) ([place])         Past Surgical History:  Procedure Laterality Date  . APPENDECTOMY  1994  . CESAREAN SECTION     x3         Allergies  Allergen Reactions  . Penicillins Other (See Comments)    Hallucination Has patient had a PCN reaction causing immediate rash, facial/tongue/throat swelling, SOB or lightheadedness with hypotension: No Has patient had a PCN reaction causing severe rash involving mucus membranes or skin necrosis: No Has patient had a PCN reaction that required hospitalization No Has patient had a PCN  reaction occurring within the last 10 years: No If all of the above answers are "NO", then may proceed with Cephalosporin use.   . Food     Pineapple-Nausea/vomiting           Medication Sig  . apixaban (ELIQUIS) 5 MG TABS tablet Take 1 tablet 5 mg by mouth twice a day   . dexamethasone (DECADRON) 1 MG tablet Take 2 mg by mouth 2 (two) times daily with a meal.   . levETIRAcetam (KEPPRA) 500 MG tablet Take 1 tablet (500 mg total) by mouth  2 (two) times daily.  Marland Kitchen nystatin (NYSTATIN) powder Apply 100,000 g topically daily as needed.  . ondansetron (ZOFRAN) 8 MG tablet Take 1 tablet (8 mg total) by mouth every 8 (eight) hours as needed for nausea.  . pantoprazole (PROTONIX) 40 MG tablet Take 40 mg by mouth daily.  . primidone (MYSOLINE) 50 MG tablet Take 50 mg by mouth at bedtime.  . senna (SENOKOT) 8.6 MG TABS tablet Take 1 tablet by mouth 2 (two) times daily.  .   T     Review of systems.  General she is not complaining of any fever chills weight appears to be relatively stabilized  at 188 pounds this is up about 4 pounds from admission her skin but is stabilized over the last couple weeks-she appears to be eating well  Skin does not complain of rashes itching or diaphoresis.  Head ears eyes nose mouth and throat is not complaining of any sore throat or difficulty swallowing or visual changes.  Respiratory denies shortness of breath or cough.  Cardiac denies chest pain has some continued lower edema appears baseline with previous exam.  GI does not complain of any abdominal discomfort nausea vomiting diarrhea constipation-appears to have a good appetite.  GU Does not complain of dysuria.  Musculoskeletal does not complain of joint pain continues to have lower extremity weakness.  Neurologic does not complain of dizziness headache syncope continues to have upper extremity tremors at times.  Psych-continues to have a positive  attitude and good spirits does not overtly complain of any depression or anxiety   Physical exam.  Temperature is 96.8 pulse 69 respirations 20 blood pressure 142/78.  Weight is 188 pounds.   In general this is a pleasant elderly female in no distress sitting comfortably in her wheelchair currently eating lunch.  Her skin is warm and dry.  Eyes visual acuity appears grossly intact she has prescription lenses sclera and conjunctive are clear.  Oropharynx is clear mucous membranes  moist.  Chest is clear to auscultation there is no labored breathing.  Heart is regular rate and rhythm without murmur gallop or rub she continues to have 1+ edema of her legs bilaterally pedal pulses are palpable but reduced.  Abdomen is soft nontender with positive bowel sounds.  Musculoskeletal is able to move all extremities x4 with some history of mild left-sided weakness especially left arm.  Neurologic is grossly intact cranial nerves appear to be intact her speech is clear-she does have a tremor of her upper extremities bilaterally this appears to be intermittent.  Psych she is alert and oriented pleasant and appropriate.  Labs.  August 18, 2017.  Albumin 3.2.  BNP 84.  WBC 6.1 hemoglobin 13.9 platelets 92.  August 15, 2017.  Sodium 140 potassium 4.2 BUN 23 creatinine 0.67.  Assessment and plan.  #1 history of glioblastoma she is status post craniotomy she is on dexamethasone-and does receive periodic  Bevacizumab injections  at [facility]- she is followed closely the at [facility]  #2- history of tremors she is on primidone again this was redosed secondary to interaction with Eliquis-her daughter will follow up on this with physicians at [facility].  3.  History of bilateral calf DVTs again she is on Eliquis now 5 mg twice daily will need to follow-up with primary care provider about length of therapy- she appears to be stable in this regards continues to have edema.  4.-History of edema-again BNP was unremarkable at 84-albumin was 3.2- leg elevation will have to be encouraged-per daughter cardiac echo at [facility] was unremarkable- this will warrant follow-up by primary care provider her weight appears to be relatively stabilized-she is not complaining of any increased chest pain or shortness of breath.  5.-History of thrombocytopenia suspect this may be medication related with her medications for the glioblastoma- platelet count was 92,000 on lab done November 6 we will update this before  discharge.  6.-Constipation she continues on senna at this appears to be stabilized.  7.  History of seizure prophylaxis-she is on Keppra-this has been quite stable during her stay here.  8.  History of hyperglycemia?  Her blood sugars here been in the mid 100s generally occasionally spike slightly over 200 but this is not persistent-will defer any aggressive follow-up to primary care provider.  9.  History of UTIs-she has completed a couple courses of Macrobid-- culture we did on November 4 was unremarkable- she did complete another course of Macrobid couple days ago will update a urine culture--if positive will need follow-up with primary care provider.   #10 Hypertension?  Systolic is mildly elevated today at 142 at this point would monitor previous systolics have been in the 120-1 1517 30 range-diastolics in the 61Y-07P generally   Clinically she appears to be doing well he has continued weakness and will need   PT   And OT for further strengthening as well as CNA to help with her activities of daily living-also nursing to help with medical issues.   also will need a bedside commode secondary to weakness -she will be going home with her spouse who is very supportive-daughter also is extremely supportive.  Again will update a CBC and metabolic before discharge. --And obtain an updated urine culture  CPT- 99316-of note greater than 30 minutes spent on this discharge summary-greater than 50% spent coordinating a plan of care for numerous diagnoses   Addendum-we have obtained  the updated lab work which is unremarkable platelet count actually up to 121,000--hemoglobin is stable at 14.8-white count is normal at 6.0  Metabolic panel is relatively unchanged sodium 140 potassium 3.7 BUN of 22 creatinine 0.64    .

## 2017-08-29 ENCOUNTER — Encounter (HOSPITAL_COMMUNITY)
Admission: RE | Admit: 2017-08-29 | Discharge: 2017-08-29 | Disposition: A | Discharge status: Home / Self Care | Payer: Medicare Other | Source: Skilled Nursing Facility | Attending: *Deleted | Admitting: *Deleted

## 2017-08-29 LAB — URINALYSIS, ROUTINE W REFLEX MICROSCOPIC
BILIRUBIN URINE: NEGATIVE
Glucose, UA: NEGATIVE mg/dL
Ketones, ur: NEGATIVE mg/dL
LEUKOCYTES UA: NEGATIVE
Nitrite: NEGATIVE
PH: 5 (ref 5.0–8.0)
Protein, ur: NEGATIVE mg/dL
SPECIFIC GRAVITY, URINE: 1.02 (ref 1.005–1.030)

## 2017-08-31 LAB — URINE CULTURE

## 2017-09-07 ENCOUNTER — Telehealth: Payer: Self-pay

## 2017-09-07 NOTE — Telephone Encounter (Signed)
Daughter called that her mother is requesting refill on nystatin mouth wash. She has a sore mouth. She had some left over that was helping but not enough to take care of sores in mouth completely.  Called pt. Her tongue is red and sore. It hurts to eat anything.   S/w Dr Irene Limbo and called daughter back. She needs to contact [facility] b/c pt is on a clinical trial. They need to collect data about the possible mucositis, and they may have a protocol set up that they prefer to use. Dr Irene Limbo would be happy to refill mouthwash but [facility] may have different preferences.   Daughter was agreeable.

## 2017-11-05 ENCOUNTER — Ambulatory Visit (HOSPITAL_COMMUNITY): Payer: Medicare Other

## 2017-11-05 ENCOUNTER — Encounter (HOSPITAL_COMMUNITY): Payer: Self-pay

## 2017-11-05 ENCOUNTER — Other Ambulatory Visit: Payer: Self-pay

## 2017-11-05 ENCOUNTER — Ambulatory Visit (HOSPITAL_COMMUNITY): Payer: Medicare Other | Attending: Nurse Practitioner

## 2017-11-05 DIAGNOSIS — M6281 Muscle weakness (generalized): Secondary | ICD-10-CM

## 2017-11-05 DIAGNOSIS — R2681 Unsteadiness on feet: Secondary | ICD-10-CM | POA: Insufficient documentation

## 2017-11-05 DIAGNOSIS — R2689 Other abnormalities of gait and mobility: Secondary | ICD-10-CM | POA: Insufficient documentation

## 2017-11-05 DIAGNOSIS — M25612 Stiffness of left shoulder, not elsewhere classified: Secondary | ICD-10-CM | POA: Diagnosis present

## 2017-11-05 DIAGNOSIS — R29898 Other symptoms and signs involving the musculoskeletal system: Secondary | ICD-10-CM

## 2017-11-05 DIAGNOSIS — R278 Other lack of coordination: Secondary | ICD-10-CM | POA: Diagnosis present

## 2017-11-05 NOTE — Patient Instructions (Signed)
Home Exercises Program Theraputty Exercises  Do the following exercises 2 times a day using your affected hand.  1. Roll putty into a ball.  2. Make into a pancake.  3. Roll putty into a roll.  4. Pinch along log with first finger and thumb.   5. Make into a ball.  6. Roll it back into a log.   7. Pinch using thumb and side of first finger.  8. Roll into a ball, then flatten into a pancake.  9. Using your fingers, make putty into a mountain.  10. Roll putty back into a ball and squeeze and release 10 times.

## 2017-11-05 NOTE — Therapy (Signed)
[place], [place], [address] Phone: [phone]   Fax:  [phone]  Physical Therapy Evaluation  Patient Details  Name: Donna Valentine MRN: [id number] Date of Birth: 10-24-1940 Referring Provider: Dr. Nira Retort FNP   Encounter Date: 11/05/2017  PT End of Session - 11/05/17 1146    Visit Number  1    Number of Visits  17    Date for PT Re-Evaluation  12/03/17    Authorization Type  [employer]    Authorization Time Period  11/05/17 - 12/31/17    PT Start Time  0816    PT Stop Time  0901    PT Time Calculation (min)  45 min    Equipment Utilized During Treatment  Gait belt RW and Wheelchair follow    Activity Tolerance  Patient tolerated treatment well;Patient limited by fatigue    Behavior During Therapy  WFL for tasks assessed/performed       Past Medical History:  Diagnosis Date  . GBM (glioblastoma multiforme) ([place])     Past Surgical History:  Procedure Laterality Date  . APPENDECTOMY  1994  . APPLICATION OF CRANIAL NAVIGATION N/A 11/28/2016   Procedure: APPLICATION OF CRANIAL NAVIGATION;  Surgeon: Ashok Pall, MD;  Location: [place];  Service: Neurosurgery;  Laterality: N/A;  . CESAREAN SECTION     x3  . CRANIOTOMY Right 11/28/2016   Procedure: STERIOTACTIC PARIETAL CRANIOTOMY FOR RIGHT FRONTAL TUMOR RESECTION WITH BRAINLAB;  Surgeon: Ashok Pall, MD;  Location: [place];  Service: Neurosurgery;  Laterality: Right;    There were no vitals filed for this visit.   Subjective Assessment - 11/05/17 0825    Subjective  Patient arrives to PT evaluation ambulating with RW. She reports in February 2018 she was diagnosed with a brain tumor, glioblastoma, and had a craniotomy last February. She underwent chemo and radiation treatment but is no longer undergoing those at this time. She states she also had another surgery in March 2018 at [facility] and is currently participating in a  clinical trial, [facility], at [place]. She states that since her surgery in March she has need more assistance with walking and her daily activities. She ambulates with a RW and her husband has to walk with her. She reports she can walk short distances but fatigues quickly and that she feels her left side has become weaker. She also requires assistance to bathe, dress, get up and down from chair or bed and to eat. She reports she has an active tremor in BUE and it is worse in the LUE. She states she does not have pain but would like to get stronger and be more independent with walking again.    Pertinent History  Gliblastoma diagnosed February 2018, currently participating in [place] trial at [facility]    Limitations  Walking;Standing;House hold activities    How long can you sit comfortably?  unlimited    How long can you stand comfortably?  a couple minutes before getting tired    How long can you walk comfortably?  1-2 minutes before getting too tired to continue    Patient Stated Goals  become more independent with walking again    Currently in Pain?  No/denies         [facility] PT Assessment - 11/05/17 0001      Assessment   Medical Diagnosis  Glioblastoma with generalized weakness    Referring Provider  Dr. Nira Retort, FNP  Hand Dominance  Right    Next MD Visit  unsure, follow up next month    Prior Therapy  PT here followign initial diagnosis in February 2018, PT and OT       Precautions   Precautions  Fall      Restrictions   Weight Bearing Restrictions  No      Balance Screen   Has the patient fallen in the past 6 months  No    Has the patient had a decrease in activity level because of a fear of falling?   Yes    Is the patient reluctant to leave their home because of a fear of falling?   No      Home Film/video editor residence    Living Arrangements  Spouse/significant other    Available Help at Discharge  Family    Type of [place] to enter    Entrance Stairs-Number of Steps  3    Entrance Stairs-Rails  Can reach both    [place]  One level    Home Equipment  Wheelchair - [employer] - 2 wheels;Bedside commode;Grab bars - tub/shower;Tub bench;Hand held shower head pwer recliner chair    Additional Comments  front door has 1 step like a curb with no rails      Prior Function   Level of Independence  Needs assistance with gait;Needs assistance with transfers;Needs assistance with ADLs    Vocation  Retired      Observation/Other Assessments-Edema    Edema  -- swelling in [place], MD is following with patient      Coordination   Heel Shin Test  impaired sequencing      Posture/Postural Control   Posture/Postural Control  Postural limitations    Postural Limitations  Rounded Shoulders;Forward head;Flexed trunk      ROM / Strength   AROM / PROM / Strength  AROM;Strength      AROM   Overall AROM   Within functional limits for tasks performed      Strength   Right Hip Flexion  3-/5    Right Hip Extension  -- unable to achieve test position    Right Hip ABduction  2+/5    Left Hip Flexion  3-/5    Left Hip Extension  -- unable to achieve test position    Left Hip ABduction  2+/5    Right/Left Knee  Right;Left    Right Knee Flexion  4/5    Right Knee Extension  4+/5    Left Knee Flexion  4-/5    Left Knee Extension  4/5    Right Ankle Dorsiflexion  4/5    Left Ankle Dorsiflexion  4-/5      Transfers   Transfers  Sit to Stand;Stand to Sit;Supine to Sit;Sit to Supine    Sit to Stand  4: Min assist;With upper extremity assist    Sit to Stand Details  Tactile cues for initiation;Visual cues/gestures for sequencing;Verbal cues for technique;Verbal cues for precautions/safety    Sit to Stand Details (indicate cue type and reason)  cues for trunk lean, to push up from surface and not RW to improve safety    Stand to Sit  4: Min guard;5: Supervision;Uncontrolled descent    Stand to  Sit Details (indicate cue type and reason)  Verbal cues for sequencing;Verbal cues for technique;Verbal cues for precautions/safety    Stand to Sit Details  cues to reach back for safety    Supine to Sit  1: +2 Total assist    Supine to Sit Details (indicate cue type and reason)  1 person assist for log roll technique and 2 person for sidelying to sit for traunk control and cues for UE position/use    Supine to Sit Details  Manual facilitation for weight shifting;Manual facilitation for placement;Verbal cues for technique;Verbal cues for sequencing;Tactile cues for initiation;Tactile cues for sequencing;Tactile cues for placement    Supine to Sit Patient Percentage  10%    Sit to Supine  4: Min assist    Sit to Supine Details (indicate cue type and reason)  Tactile cues for sequencing;Tactile cues for placement;Verbal cues for sequencing;Verbal cues for technique    Sit to Supine Details   cues for BLE management and UE positioning      Ambulation/Gait   Ambulation/Gait  Yes attempting 3MWT    Ambulation/Gait Assistance  4: Min guard    Ambulation Distance (Feet)  162 Feet 1:57     Assistive device  Rolling walker    Gait Pattern  Decreased step length - left;Decreased step length - right;Decreased hip/knee flexion - right;Decreased stride length;Decreased hip/knee flexion - left;Decreased dorsiflexion - right;Decreased dorsiflexion - left;Left foot flat;Shuffle;Trendelenburg;Trunk flexed;Narrow base of support;Poor foot clearance - left;Poor foot clearance - right    Ambulation Surface  Level    Gait velocity  0.42 m/s    Stairs  Yes    Stairs Assistance  4: Min guard    Stair Management Technique  Step to pattern;Forwards;Two rails    Number of Stairs  6    Height of Stairs  4    Gait Comments  Patient requires cues for safe hand position on RW. Patient with difficulty turning and bumping her left foot into RW.      Standardized Balance Assessment   Standardized Balance Assessment  Berg  Balance Test      Berg Balance Test   Sit to Stand  Needs minimal aid to stand or to stabilize    Standing Unsupported  Able to stand 30 seconds unsupported    Sitting with Back Unsupported but Feet Supported on Floor or Stool  Able to sit safely and securely 2 minutes    Stand to Sit  Uses backs of legs against chair to control descent    Transfers  Needs one person to assist    Standing Unsupported with Eyes Closed  Able to stand 10 seconds with supervision    Standing Ubsupported with Feet Together  Able to place feet together independently but unable to hold for 30 seconds    From Standing, Reach Forward with Outstretched Arm  Reaches forward but needs supervision    From Standing Position, Pick up Object from Floor  Unable to try/needs assist to keep balance    From Standing Position, Turn to Look Behind Over each Shoulder  Needs supervision when turning    Turn 360 Degrees  Needs assistance while turning    Standing Unsupported, Alternately Place Feet on Step/Stool  Needs assistance to keep from falling or unable to try    Standing Unsupported, One Foot in Front  Needs help to step but can hold 15 seconds    Standing on One Leg  Unable to try or needs assist to prevent fall    Total Score  18    Berg comment:  18/56 indicates the patient is at an extremely high risk of falling  Objective measurements completed on examination: See above findings.      [facility] Adult PT Treatment/Exercise - 11/05/17 0001      Exercises   Exercises  Knee/Hip      Knee/Hip Exercises: Seated   Other Seated Knee/Hip Exercises  Toes raises; 1x 10 BLE with 5 seconds holds    Marching  Strengthening;Both;1 set;10 reps    Marching Limitations  5 seconds holds         PT Education - 11/05/17 1145    Education provided  Yes    Education Details  Educaetd on exam findings and discussed POC. Initiated HEP for BLE strengthening.    Person(s) Educated  Patient    Methods  Explanation;Demonstration     Comprehension  Verbalized understanding;Need further instruction;Returned demonstration       PT Short Term Goals - 11/05/17 1223      PT SHORT TERM GOAL #1   Title  Patient to be independent in correctly and consistently performing targeted HEP, to be updated as appropriate     Time  2    Period  Weeks    Status  New    Target Date  11/19/17      PT SHORT TERM GOAL #2   Title  Patient to be able to ambulate wiht a gait velocity => 0.6 m/s during 3MWT in order to demonstrate improved activity tolerance and demonstrate decreased dependence with ADL's/IADL's, reduce risk of hospitalization and improve community ambulation.    Time  4    Period  Weeks    Status  New    Target Date  12/03/17      PT SHORT TERM GOAL #3   Title  Patient to be able to identify 5/5 safety factors/concepts in order to demonstrate good safety and reduced fall risk     Time  4    Period  Weeks    Status  New        PT Long Term Goals - 11/05/17 1226      PT LONG TERM GOAL #1   Title  Patient to demonstrate improved functional strength by 1 grade with MMT for all tested muscle groups in order to improve overall gait and balance.    Time  8    Period  Weeks    Status  New    Target Date  12/31/17      PT LONG TERM GOAL #2   Title  Patient will ascend/descend 4 6" stairs with 1 hand rail to demosntrate improved functional mobility/strength and improved independence with stair ambulation.    Time  8    Period  Weeks    Status  New      PT LONG TERM GOAL #3   Title  Patient to be able to ambulate wiht a gait velocity => 0.8 m/s during 3MWT in order to demonstrate improved activity tolerance and demonstrate decreased dependence with ADL's/IADL's, reduce risk of hospitalization, reduce fall risk, and improve community ambulation.    Time  8    Period  Weeks    Status  New      PT LONG TERM GOAL #4   Title  Patient will improve Berg Balance score to 37/56 or higher to demosntrate decreased risk of  falling.    Time  8    Period  Weeks    Status  New        Plan - 11/05/17 1150    Clinical Impression Statement  Donna Valentine  presents for physical therapy evaluation due to a decrease in functional mobility secondary to right frontal glioblastoma. She is functioning below her PLOF prior to surgery in March and now requires assistance for all transfers, bed mobility, and gait. She has significant bilateral lower extremity weakness and balance deficits placing her at a high fall risk. She presents with decreased activity tolerance and was unable to complete the 3 MWT today due to LE fatigue. Her current gait velocity places her in the limited community ambulator category, places her at an increased risk of hospitalization, and falls. Donna Valentine will benefit from skilled PT interventions to address current impairments and improve functional strength/mobility to increase her independence and improve QOL.    History and Personal Factors relevant to plan of care:  Glioblastoma since February 2018, currently in clinical trial at [facility])    Clinical Presentation  Stable    Clinical Presentation due to:  decreased balacne, poor strength, fall risk, decreased activity tolerance, and clinical judgement    Clinical Decision Making  Low    Rehab Potential  Fair    PT Frequency  2x / week    PT Duration  8 weeks    PT Treatment/Interventions  ADLs/Self Care Home Management;DME Instruction;Gait training;Stair training;Functional mobility training;Therapeutic activities;Therapeutic exercise;Balance training;Neuromuscular re-education;Patient/family education;Manual techniques;Passive range of motion;Energy conservation    PT Next Visit Plan  Review Eval/goals. Initiate gait training with safe use of RW, also safe use of RW during sequencing sit to stands. Initiate BLE strengthening and balance training with retro walking using RW and tandem stance activitites.     PT Home Exercise Plan   Eval: seated marching, seated toe raises    Consulted and Agree with Plan of Care  Patient       Patient will benefit from skilled therapeutic intervention in order to improve the following deficits and impairments:  Abnormal gait, Decreased knowledge of use of DME, Improper body mechanics, Decreased mobility, Postural dysfunction, Decreased activity tolerance, Decreased endurance, Decreased strength, Decreased balance, Decreased safety awareness, Difficulty walking, Impaired flexibility  Visit Diagnosis: Muscle weakness (generalized)  Other abnormalities of gait and mobility  Other lack of coordination  Unsteadiness on feet     Problem List Patient Active Problem List   Diagnosis Date Noted  . Oral candida 01/08/2017  . Goals of care, counseling/discussion   . Glioblastoma multiforme of frontal lobe ([place]) 12/01/2016  . 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 Left-sided weakness 11/20/2016  . 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 HYPERCHOLESTEROLEMIA, BORDERLINE 07/28/2008  . ANEMIA, MILD 07/28/2008  . DIVERTICULOSIS OF COLON 07/28/2008  . HEMATURIA, HX OF 07/28/2008  . COLONIC POLYPS 07/27/2008  . RIGHT BUNDLE BRANCH BLOCK 07/27/2008  . PREMATURE ATRIAL CONTRACTIONS 07/27/2008  . HEMORRHOIDS 07/27/2008  . VENOUS INSUFFICIENCY 07/27/2008  . BACK PAIN, LUMBAR 07/27/2008  . SCOLIOSIS 07/27/2008  . PALPITATIONS, HX OF 07/27/2008    Kipp Brood, PT, DPT Physical Therapist with [facility]  11/05/2017 12:31 PM    [place], [place], [address] Phone: [phone]   Fax:  [phone]  Name: Donna Valentine MRN: [id number] Date of Birth: 01/04/1941

## 2017-11-05 NOTE — Patient Instructions (Signed)
   SEATED MARCHING: 1-2 sets of 10-15 reps with 5 second holds  While seated in a chair, lift up your foot and knee, set it down and then perform on the other leg. Repeat this alternating movement.      TOES RAISES - DORSIFLEXION - BOTH: 1-2 sets of 10-15 reps with 5 second holds  Start with your feet on the ground.  Next, raise up both forefeet and toes as shown as you bend at your ankle.  Keep your heels on the ground the entire time.

## 2017-11-05 NOTE — Therapy (Signed)
[place], [place], [address] Phone: [phone]   Fax:  [phone]  Occupational Therapy Evaluation  Patient Details  Name: Donna Valentine MRN: [id number] Date of Birth: 1941-02-20 Referring Provider: Dr. Nira Retort FNP   Encounter Date: 11/05/2017  OT End of Session - 11/05/17 1459    Visit Number  1    Number of Visits  16    Date for OT Re-Evaluation  12/31/17 mini reassess: 12/03/17    Authorization Type  [employer] medicare $20 co-pay for OT (stands alone)    OT Start Time  0903    OT Stop Time  0940    OT Time Calculation (min)  37 min    Activity Tolerance  Patient tolerated treatment well    Behavior During Therapy  [facility] for tasks assessed/performed       Past Medical History:  Diagnosis Date  . GBM (glioblastoma multiforme) ([place])     Past Surgical History:  Procedure Laterality Date  . APPENDECTOMY  1994  . APPLICATION OF CRANIAL NAVIGATION N/A 11/28/2016   Procedure: APPLICATION OF CRANIAL NAVIGATION;  Surgeon: Ashok Pall, MD;  Location: [place];  Service: Neurosurgery;  Laterality: N/A;  . CESAREAN SECTION     x3  . CRANIOTOMY Right 11/28/2016   Procedure: STERIOTACTIC PARIETAL CRANIOTOMY FOR RIGHT FRONTAL TUMOR RESECTION WITH BRAINLAB;  Surgeon: Ashok Pall, MD;  Location: [place];  Service: Neurosurgery;  Laterality: Right;    There were no vitals filed for this visit.  Subjective Assessment - 11/05/17 0909    Subjective   S: My left side is weak again.    Pertinent History  patient is a 77 y/o female with History of right frontal glioblastoma status post resection in February 2018 followed by chemoradiation. Research infusion in September 2018. Avastin initiated in October 2018. Patient received OT services previously at this clinic approximately february 2018 for left side weakness. Pt was discharged with a HEP. Patient is not experiencing increased weakness to left side which is causing  difficulty with functional tasks such as bathing and dressing requiring increased assistance. Bernita Buffy, FNP has referred patient to occupational therapy for evaluation and treatment.     Patient Stated Goals  To be able to use her left arm better.    Currently in Pain?  No/denies        [facility] OT Assessment - 11/05/17 8563      Assessment   Medical Diagnosis  left side weakness    Referring Provider  Dr. Nira Retort FNP    Onset Date/Surgical Date  06/25/17    Hand Dominance  Right    Next MD Visit  -- Next month follow up     Prior Therapy  Pt did received OT services for left side weakness early 2018.      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 Husband provides assistance for dressing tasks.       Mobility   Mobility Status  Independent      Written Expression   Dominant Hand  Right      Vision - History   Baseline Vision  Wears glasses all the time      Observation/Other Assessments   Other Surveys   --      Posture/Postural Control   Posture/Postural Control  Postural limitations    Postural Limitations  Rounded Shoulders;Forward head;Flexed trunk      Coordination   Box and Blocks  Left: 3     Tremors  Increased essential tremors in left hand. More pronounced with volitional movement.    Coordination  Decreased due to tremor.      Edema   Edema  Edema noted in left hand. Mild      ROM / Strength   AROM / PROM / Strength  AROM;Strength      AROM    Overall AROM   Deficits    Overall AROM Comments  Shoulder flexion, abduction, external and internal rotation is limited to 50% range..    AROM Assessment Site  Shoulder    Right/Left Shoulder  Left      Strength   Overall Strength Comments  Assessed seated.     Strength Assessment Site  Shoulder;Elbow;Hand    Right/Left Shoulder  Left    Left Shoulder Flexion  3-/5    Left Shoulder ABduction  3-/5    Left Shoulder Horizontal ABduction  3/5    Left Shoulder Horizontal ADduction  3/5    Right/Left Elbow  Left    Left Elbow Flexion  4-/5    Left Elbow Extension  4-/5    Right/Left hand  Left;Right    Right Hand Grip (lbs)  40    Right Hand Lateral Pinch  10 lbs    Right Hand 3 Point Pinch  12 lbs    Left Hand Grip (lbs)  18    Left Hand Lateral Pinch  4 lbs    Left Hand 3 Point Pinch  2 lbs                      OT Education - 11/05/17 1458    Education provided  Yes    Education Details  yellow theraputty. Discussed plan of care and goals for therapy.    Person(s) Educated  Patient    Methods  Explanation;Handout;Verbal cues    Comprehension  Verbalized understanding       OT Short Term Goals - 11/05/17 1508      OT SHORT TERM GOAL #1   Title  Patient will be educated and independent with an HEP for improved functional use of LUE with all daily tasks.     Time  4    Period  Weeks    Status  New    Target Date  12/03/17      OT SHORT TERM GOAL #2   Title  Patient will increase A/ROM of LUE to [facility] to increase functional ability during dressing tasks.     Time  4    Period  Weeks    Status  New      OT SHORT TERM GOAL #3   Title  Patient will increase LUE strength to 4-/5 to increase ability to complete bathing tasks with less assistance.  Time  4    Period  Weeks    Status  New      OT SHORT TERM GOAL #4   Title  Patient will increase grip strength by 5# and pinch strength by 2# in left hand to increase ability to complete tasks such as squeezing  toothpaste onto a toothbrush with less difficulty.     Time  4    Period  Weeks    Status  New        OT Long Term Goals - 11/05/17 1511      OT LONG TERM GOAL #1   Title  Patient will increase functional performance during all daily tasks such as bathing, dressing and grooming requiring only Min assistance as needed from family.    Time  8    Period  Weeks    Status  New    Target Date  12/31/17      OT LONG TERM GOAL #2   Title  Patient will increase LUE strength to 4/5 in order to increase ability to complete dressing tasks with less assistance.     Time  8    Period  Weeks    Status  New      OT LONG TERM GOAL #3   Title  Patient will increase grip strength by 8# and pinch strength by 5# in left hand in order to be able to hold onto items without dropping them.    Time  8    Period  Weeks    Status  New            Plan - 11/05/17 1500    Clinical Impression Statement  A: Patient is a 77 y/o female S/P left side weakness from glioblastoma resulting in difficulty completing daily tasks requiring increased assistance from family. Discussed with patient to pull out weighted utensils and weighted gloves as she reports that they help a little with her tremors. Discussed that tremors will not be resolved with therapy as they are more than likely related to her brain tumor and they did not resolve with previous therapy services. Pt verbalized understanding.     Occupational Profile and client history currently impacting functional performance  motiviated. strong social support at home.    Occupational performance deficits (Please refer to evaluation for details):  ADL's;Leisure;IADL's;Rest and Sleep    Rehab Potential  Good    Current Impairments/barriers affecting progress:  hx of cancer, prognosis    OT Frequency  2x / week    OT Duration  8 weeks    OT Treatment/Interventions  Self-care/ADL training;Patient/family education;DME and/or AE instruction;Passive range of  motion;Therapeutic activities;Therapeutic exercise    Plan  P: Patient will benefit from skilled OT services to increase functional performance during daily tasks. Treatment Plan: grip and pinch strengthening, task modification and AE recommendations as needed, shoulder strengthening, P/ROM, AA/ROM, A/ROM, general strengthening.  Next session: Attempt AA/ROM shoulder exercises and add to HEP if appropriate.     Clinical Decision Making  Several treatment options, min-mod task modification necessary    Consulted and Agree with Plan of Care  Patient       Patient will benefit from skilled therapeutic intervention in order to improve the following deficits and impairments:  Decreased endurance, Decreased activity tolerance, Decreased strength, Decreased range of motion, Impaired UE functional use  Visit Diagnosis: Other symptoms and signs involving the musculoskeletal system - Plan: Ot plan of care cert/re-cert  Stiffness of left shoulder, not elsewhere  classified - Plan: Ot plan of care cert/re-cert    Problem List Patient Active Problem List   Diagnosis Date Noted  . Oral candida 01/08/2017  . Goals of care, counseling/discussion   . Glioblastoma multiforme of frontal lobe ([place]) 12/01/2016  . 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 Left-sided weakness 11/20/2016  . 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 HYPERCHOLESTEROLEMIA, BORDERLINE 07/28/2008  . ANEMIA, MILD 07/28/2008  . DIVERTICULOSIS OF COLON 07/28/2008  . HEMATURIA, HX OF 07/28/2008  . COLONIC POLYPS 07/27/2008  . RIGHT BUNDLE BRANCH BLOCK 07/27/2008  . PREMATURE ATRIAL CONTRACTIONS 07/27/2008  . HEMORRHOIDS 07/27/2008  . VENOUS INSUFFICIENCY 07/27/2008  . BACK PAIN, LUMBAR 07/27/2008  . SCOLIOSIS 07/27/2008  . PALPITATIONS, HX OF 07/27/2008   [name], OTR/L,CBIS  [phone]  11/05/2017, 3:16 PM  [facility] [address] [place], [place], [address] Phone: [phone]   Fax:  [phone]  Name: Donna Valentine MRN: [id number] Date of Birth: 07-Jun-1941

## 2017-11-10 ENCOUNTER — Ambulatory Visit (HOSPITAL_COMMUNITY): Payer: Medicare Other | Admitting: Physical Therapy

## 2017-11-10 ENCOUNTER — Encounter (HOSPITAL_COMMUNITY): Payer: Self-pay | Admitting: Physical Therapy

## 2017-11-10 ENCOUNTER — Other Ambulatory Visit: Payer: Self-pay

## 2017-11-10 DIAGNOSIS — R2689 Other abnormalities of gait and mobility: Secondary | ICD-10-CM

## 2017-11-10 DIAGNOSIS — M6281 Muscle weakness (generalized): Secondary | ICD-10-CM

## 2017-11-10 DIAGNOSIS — R2681 Unsteadiness on feet: Secondary | ICD-10-CM

## 2017-11-10 DIAGNOSIS — R278 Other lack of coordination: Secondary | ICD-10-CM

## 2017-11-10 DIAGNOSIS — R29898 Other symptoms and signs involving the musculoskeletal system: Secondary | ICD-10-CM | POA: Diagnosis not present

## 2017-11-10 NOTE — Therapy (Signed)
[facility] [address] [place], [place], [address] Phone: [phone]   Fax:  [phone]  Physical Therapy Treatment  Patient Details  Name: Donna Valentine MRN: [id number] Date of Birth: 04-04-1941 Referring Provider: Dr. Nira Retort FNP   Encounter Date: 11/10/2017  PT End of Session - 11/10/17 1140    Visit Number  2    Number of Visits  17    Date for PT Re-Evaluation  12/03/17    Authorization Type  [employer] Medicare    Authorization Time Period  11/05/17 - 12/31/17    Authorization - Visit Number  2    Authorization - Number of Visits  10    PT Start Time  1115    PT Stop Time  1155    PT Time Calculation (min)  40 min    Equipment Utilized During Treatment  Gait belt RW and Wheelchair follow    Activity Tolerance  Patient tolerated treatment well;Patient limited by fatigue    Behavior During Therapy  WFL for tasks assessed/performed       Past Medical History:  Diagnosis Date  . GBM (glioblastoma multiforme) ([place])     Past Surgical History:  Procedure Laterality Date  . APPENDECTOMY  1994  . APPLICATION OF CRANIAL NAVIGATION N/A 11/28/2016   Procedure: APPLICATION OF CRANIAL NAVIGATION;  Surgeon: Ashok Pall, MD;  Location: [place];  Service: Neurosurgery;  Laterality: N/A;  . CESAREAN SECTION     x3  . CRANIOTOMY Right 11/28/2016   Procedure: STERIOTACTIC PARIETAL CRANIOTOMY FOR RIGHT FRONTAL TUMOR RESECTION WITH BRAINLAB;  Surgeon: Ashok Pall, MD;  Location: [place];  Service: Neurosurgery;  Laterality: Right;    There were no vitals filed for this visit.  Subjective Assessment - 11/10/17 1132    Subjective  Pt states she did her ankle pumps but did not do the marching exercise due to being busy     Pertinent History  Gliblastoma diagnosed February 2018, currently participating in [place] trial [facility]    Limitations  Walking;Standing;House hold activities    How long can you sit  comfortably?  unlimited    How long can you stand comfortably?  a couple minutes before getting tired    How long can you walk comfortably?  1-2 minutes before getting too tired to continue    Patient Stated Goals  become more independent with walking again    Currently in Pain?  No/denies                      [facility] Adult PT Treatment/Exercise - 11/10/17 0001      Ambulation/Gait   Ambulation/Gait  Yes    Ambulation/Gait Assistance  5: Supervision    Ambulation Distance (Feet)  160 Feet    Assistive device  Rolling walker      Exercises   Exercises  Knee/Hip      Knee/Hip Exercises: Standing   Heel Raises  Both;10 reps    Hip Abduction  Stengthening;Both;10 reps    Hip Extension  Stengthening;Both;10 reps    Functional Squat  10 reps    SLS  x 5     Other Standing Knee Exercises  tandem stance x 2       Knee/Hip Exercises: Seated   Long Arc Quad  Both;5 reps    Other Seated Knee/Hip Exercises  Toes raises; 1x 10 BLE with 5 seconds holds    Marching  Strengthening;Both;1 set;10 reps  Marching Limitations  5 seconds holds    Abduction/Adduction   Strengthening;Both;5 reps    Abd/Adduction Limitations  isometric     Sit to [employer]  5 reps             PT Education - 11/10/17 1201    Education provided  Yes    Education Details  additional HEP    Person(s) Educated  Patient    Methods  Explanation;Handout    Comprehension  Verbalized understanding;Returned demonstration       PT Short Term Goals - 11/10/17 1204      PT SHORT TERM GOAL #1   Title  Patient to be independent in correctly and consistently performing targeted HEP, to be updated as appropriate     Time  2    Period  Weeks    Status  On-going      PT SHORT TERM GOAL #2   Title  Patient to be able to ambulate wiht a gait velocity => 0.6 m/s during 3MWT in order to demonstrate improved activity tolerance and demonstrate decreased dependence with ADL's/IADL's, reduce risk of hospitalization  and improve community ambulation.    Time  4    Period  Weeks    Status  On-going      PT SHORT TERM GOAL #3   Title  Patient to be able to identify 5/5 safety factors/concepts in order to demonstrate good safety and reduced fall risk     Time  4    Period  Weeks    Status  On-going        PT Long Term Goals - 11/10/17 1204      PT LONG TERM GOAL #1   Title  Patient to demonstrate improved functional strength by 1 grade with MMT for all tested muscle groups in order to improve overall gait and balance.    Time  8    Period  Weeks    Status  On-going      PT LONG TERM GOAL #2   Title  Patient will ascend/descend 4 6" stairs with 1 hand rail to demosntrate improved functional mobility/strength and improved independence with stair ambulation.    Time  8    Period  Weeks    Status  On-going      PT LONG TERM GOAL #3   Title  Patient to be able to ambulate wiht a gait velocity => 0.8 m/s during 3MWT in order to demonstrate improved activity tolerance and demonstrate decreased dependence with ADL's/IADL's, reduce risk of hospitalization, reduce fall risk, and improve community ambulation.    Time  8    Period  Weeks    Status  On-going      PT LONG TERM GOAL #4   Title  Patient will improve Berg Balance score to 37/56 or higher to demosntrate decreased risk of falling.    Time  8    Period  Weeks    Status  On-going            Plan - 11/10/17 1140    Clinical Impression Statement  Pt evaluation and goals reviewed with patient.  Added exercises to HEP with emphasis on how important the HEP will be to obtain pt goals.  PT with noted improved edurance this session.     Rehab Potential  Fair    PT Frequency  2x / week    PT Duration  8 weeks    PT Treatment/Interventions  ADLs/Self Care Home Management;DME Instruction;Gait  training;Stair training;Functional mobility training;Therapeutic activities;Therapeutic exercise;Balance training;Neuromuscular  re-education;Patient/family education;Manual techniques;Passive range of motion;Energy conservation    PT Next Visit Plan  Continue with balance activity to include side stepping and retro gt ; may need AD with retrogait activity.     PT Home Exercise Plan  Eval: seated marching, seated toe raises    Consulted and Agree with Plan of Care  Patient       Patient will benefit from skilled therapeutic intervention in order to improve the following deficits and impairments:  Abnormal gait, Decreased knowledge of use of DME, Improper body mechanics, Decreased mobility, Postural dysfunction, Decreased activity tolerance, Decreased endurance, Decreased strength, Decreased balance, Decreased safety awareness, Difficulty walking, Impaired flexibility  Visit Diagnosis: Muscle weakness (generalized)  Other abnormalities of gait and mobility  Unsteadiness on feet  Other lack of coordination     Problem List Patient Active Problem List   Diagnosis Date Noted  . Oral candida 01/08/2017  . Goals of care, counseling/discussion   . Glioblastoma multiforme of frontal lobe ([place]) 12/01/2016  . 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 Left-sided weakness 11/20/2016  . 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 HYPERCHOLESTEROLEMIA, BORDERLINE 07/28/2008  . ANEMIA, MILD 07/28/2008  . DIVERTICULOSIS OF COLON 07/28/2008  . HEMATURIA, HX OF 07/28/2008  . COLONIC POLYPS 07/27/2008  . RIGHT BUNDLE BRANCH BLOCK 07/27/2008  . PREMATURE ATRIAL CONTRACTIONS 07/27/2008  . HEMORRHOIDS 07/27/2008  . VENOUS INSUFFICIENCY 07/27/2008  . BACK PAIN, LUMBAR 07/27/2008  . SCOLIOSIS 07/27/2008  . PALPITATIONS, HX OF 07/27/2008   Rayetta Humphrey, PT CLT [phone] 11/10/2017, 12:05 PM  [facility] [address] [place], [place], [address] Phone: [phone]   Fax:  [phone]  Name: Emalee Knies MRN:  [id number] Date of Birth: 1941/03/03

## 2017-11-10 NOTE — Patient Instructions (Signed)
Extension / Flexion    Extend leg as high as is comfortable. Hold _5__ seconds. Bend knee, returning foot to support. Repeat _10__ times each leg, alternating. Do _3__ sessions per day. Repeat opposite leg   Copyright  VHI. All rights reserved.  Adduction With Resistance    Spread legs and place hands on inside of thighs(May use a pillow.) Squeeze legs together  for _5__ seconds. Repeat _10__ times. Do ___3 sessions per day. Note: If possible, place feet on floor.  Copyright  VHI. All rights reserved.  Abduction With Resistance    Put legs together and place hands on outside of thighs. Spread legs while resisting with hands for 5___ seconds. Repeat _10__ times. Do _3__ sessions per day. Note: If possible, place feet on floor.  Copyright  VHI. All rights reserved.  Functional Quadriceps: Sit to Stand    Sit on edge of chair, feet flat on floor. Stand upright, extending knees fully. Repeat __5__ times per set. Do __1__ sets per session. Do __2__ sessions per day.  http://orth.exer.us/734   Copyright  VHI. All rights reserved.

## 2017-11-12 ENCOUNTER — Ambulatory Visit (HOSPITAL_COMMUNITY): Payer: Medicare Other | Admitting: Occupational Therapy

## 2017-11-12 ENCOUNTER — Ambulatory Visit (HOSPITAL_COMMUNITY): Payer: Medicare Other | Admitting: Physical Therapy

## 2017-11-12 ENCOUNTER — Encounter (HOSPITAL_COMMUNITY): Payer: Self-pay | Admitting: Occupational Therapy

## 2017-11-12 DIAGNOSIS — R29898 Other symptoms and signs involving the musculoskeletal system: Secondary | ICD-10-CM

## 2017-11-12 DIAGNOSIS — M6281 Muscle weakness (generalized): Secondary | ICD-10-CM

## 2017-11-12 DIAGNOSIS — R2681 Unsteadiness on feet: Secondary | ICD-10-CM

## 2017-11-12 DIAGNOSIS — R2689 Other abnormalities of gait and mobility: Secondary | ICD-10-CM

## 2017-11-12 DIAGNOSIS — R278 Other lack of coordination: Secondary | ICD-10-CM

## 2017-11-12 DIAGNOSIS — M25612 Stiffness of left shoulder, not elsewhere classified: Secondary | ICD-10-CM

## 2017-11-12 NOTE — Therapy (Signed)
[place], [place], [address] Phone: [phone]   Fax:  [phone]  Physical Therapy Treatment  Patient Details  Name: Donna Valentine MRN: [id number] Date of Birth: 1941/07/01 Referring Provider: Dr. Nira Retort FNP   Encounter Date: 11/12/2017  PT End of Session - 11/12/17 1542    Visit Number  3    Number of Visits  17    Date for PT Re-Evaluation  12/03/17    Authorization Type  [employer]    Authorization Time Period  11/05/17 - 12/31/17    Authorization - Visit Number  3    Authorization - Number of Visits  10    Equipment Utilized During Treatment  Gait belt RW and Wheelchair follow    Activity Tolerance  Patient tolerated treatment well;Patient limited by fatigue    Behavior During Therapy  [facility] for tasks assessed/performed       Past Medical History:  Diagnosis Date  . GBM (glioblastoma multiforme) ([place])     Past Surgical History:  Procedure Laterality Date  . APPENDECTOMY  1994  . APPLICATION OF CRANIAL NAVIGATION N/A 11/28/2016   Procedure: APPLICATION OF CRANIAL NAVIGATION;  Surgeon: Ashok Pall, MD;  Location: [place];  Service: Neurosurgery;  Laterality: N/A;  . CESAREAN SECTION     x3  . CRANIOTOMY Right 11/28/2016   Procedure: STERIOTACTIC PARIETAL CRANIOTOMY FOR RIGHT FRONTAL TUMOR RESECTION WITH BRAINLAB;  Surgeon: Ashok Pall, MD;  Location: [place];  Service: Neurosurgery;  Laterality: Right;    There were no vitals filed for this visit.  Subjective Assessment - 11/12/17 1358    Subjective  Pt states she is feeling good today.  States she is doing better with her HEP.    Currently in Pain?  No/denies                      [facility] Adult PT Treatment/Exercise - 11/12/17 1359      Ambulation/Gait   Gait Comments  RW 200 feet X 2 bouts      Knee/Hip Exercises: Standing   Heel Raises  15 reps    Hip Abduction  Both;10 reps;2 sets    Hip Extension   Both;10 reps;2 sets    Functional Squat  15 reps    Other Standing Knee Exercises  tandem stance x 2     Other Standing Knee Exercises  side stepping, retro ambulation 1RT on blue line no AD      Knee/Hip Exercises: Seated   Long Arc Quad  Both;10 reps               PT Short Term Goals - 11/10/17 1204      PT SHORT TERM GOAL #1   Title  Patient to be independent in correctly and consistently performing targeted HEP, to be updated as appropriate     Time  2    Period  Weeks    Status  On-going      PT SHORT TERM GOAL #2   Title  Patient to be able to ambulate wiht a gait velocity => 0.6 m/s during 3MWT in order to demonstrate improved activity tolerance and demonstrate decreased dependence with ADL's/IADL's, reduce risk of hospitalization and improve community ambulation.    Time  4    Period  Weeks    Status  On-going      PT SHORT TERM GOAL #3   Title  Patient to be able to  identify 5/5 safety factors/concepts in order to demonstrate good safety and reduced fall risk     Time  4    Period  Weeks    Status  On-going        PT Long Term Goals - 11/10/17 1204      PT LONG TERM GOAL #1   Title  Patient to demonstrate improved functional strength by 1 grade with MMT for all tested muscle groups in order to improve overall gait and balance.    Time  8    Period  Weeks    Status  On-going      PT LONG TERM GOAL #2   Title  Patient will ascend/descend 4 6" stairs with 1 hand rail to demosntrate improved functional mobility/strength and improved independence with stair ambulation.    Time  8    Period  Weeks    Status  On-going      PT LONG TERM GOAL #3   Title  Patient to be able to ambulate wiht a gait velocity => 0.8 m/s during 3MWT in order to demonstrate improved activity tolerance and demonstrate decreased dependence with ADL's/IADL's, reduce risk of hospitalization, reduce fall risk, and improve community ambulation.    Time  8    Period  Weeks    Status   On-going      PT LONG TERM GOAL #4   Title  Patient will improve Berg Balance score to 37/56 or higher to demosntrate decreased risk of falling.    Time  8    Period  Weeks    Status  On-going            Plan - 11/12/17 1542    Clinical Impression Statement  Pt arrived in wheelchair.  Noted instabiity in LE's (Lt weaker) and tremor in Lt hand.  Worked on general safety wtih transfers and proper hand placement.  Pt able to complete 2 rounds of 200 feet ambulating with RW with cues to advance Lt LE further, decrease shuffle and maintain positioning within walker BOS.  Pt with difficulty completing hip abduction on Lt with tactile assist from therapist to keep LE in alignment.  Added sidestepping and retro gait with patient reporting fear she was falling backward during half point of both tasks.  Pt requires frequent rest breaks during session.     Rehab Potential  Fair    PT Frequency  2x / week    PT Duration  8 weeks    PT Treatment/Interventions  ADLs/Self Care Home Management;DME Instruction;Gait training;Stair training;Functional mobility training;Therapeutic activities;Therapeutic exercise;Balance training;Neuromuscular re-education;Patient/family education;Manual techniques;Passive range of motion;Energy conservation    PT Next Visit Plan  Continue to increase strength and stabiltiy.  Challenge with balance activity as able.      PT Home Exercise Plan  Eval: seated marching, seated toe raises    Consulted and Agree with Plan of Care  Patient       Patient will benefit from skilled therapeutic intervention in order to improve the following deficits and impairments:  Abnormal gait, Decreased knowledge of use of DME, Improper body mechanics, Decreased mobility, Postural dysfunction, Decreased activity tolerance, Decreased endurance, Decreased strength, Decreased balance, Decreased safety awareness, Difficulty walking, Impaired flexibility  Visit Diagnosis: Muscle weakness  (generalized)  Other abnormalities of gait and mobility  Unsteadiness on feet  Other lack of coordination     Problem List Patient Active Problem List   Diagnosis Date Noted  . Oral candida 01/08/2017  . Goals of  care, counseling/discussion   . Glioblastoma multiforme of frontal lobe ([place]) 12/01/2016  . 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 Left-sided weakness 11/20/2016  . 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 HYPERCHOLESTEROLEMIA, BORDERLINE 07/28/2008  . ANEMIA, MILD 07/28/2008  . DIVERTICULOSIS OF COLON 07/28/2008  . HEMATURIA, HX OF 07/28/2008  . COLONIC POLYPS 07/27/2008  . RIGHT BUNDLE BRANCH BLOCK 07/27/2008  . PREMATURE ATRIAL CONTRACTIONS 07/27/2008  . HEMORRHOIDS 07/27/2008  . VENOUS INSUFFICIENCY 07/27/2008  . BACK PAIN, LUMBAR 07/27/2008  . SCOLIOSIS 07/27/2008  . PALPITATIONS, HX OF 07/27/2008   Teena Irani, PTA/CLT [phone]  Teena Irani 11/12/2017, 3:59 PM  [place] [address] [place], [place], [address] Phone: [phone]   Fax:  [phone]  Name: Shambhavi Salley MRN: [id number] Date of Birth: [birth year]

## 2017-11-12 NOTE — Therapy (Signed)
[place], [place], [address] Phone: [phone]   Fax:  [phone]  Occupational Therapy Treatment  Patient Details  Name: Donna Valentine MRN: [id number] Date of Birth: 12-27-1940 Referring Provider: Dr. Nira Retort FNP   Encounter Date: 11/12/2017  OT End of Session - 11/12/17 1547    Visit Number  2    Number of Visits  16    Date for OT Re-Evaluation  12/31/17 mini reassess: 12/03/17    Authorization Type  [employer] medicare $20 co-pay for OT (stands alone)    OT Start Time  1308 billable time limited as pt was in restroom from [phone]    OT Stop Time  1345    OT Time Calculation (min)  37 min    Activity Tolerance  Patient tolerated treatment well    Behavior During Therapy  [facility] for tasks assessed/performed       Past Medical History:  Diagnosis Date  . GBM (glioblastoma multiforme) ([place])     Past Surgical History:  Procedure Laterality Date  . APPENDECTOMY  1994  . APPLICATION OF CRANIAL NAVIGATION N/A 11/28/2016   Procedure: APPLICATION OF CRANIAL NAVIGATION;  Surgeon: Ashok Pall, MD;  Location: [place];  Service: Neurosurgery;  Laterality: N/A;  . CESAREAN SECTION     x3  . CRANIOTOMY Right 11/28/2016   Procedure: STERIOTACTIC PARIETAL CRANIOTOMY FOR RIGHT FRONTAL TUMOR RESECTION WITH BRAINLAB;  Surgeon: Ashok Pall, MD;  Location: [place];  Service: Neurosurgery;  Laterality: Right;    There were no vitals filed for this visit.  Subjective Assessment - 11/12/17 1326    Subjective   S: I haven't done much with my arms.     Currently in Pain?  No/denies                   OT Treatments/Exercises (OP) - 11/12/17 1326      Transfers   Transfers  Sit to Stand    Sit to Stand  4: Min assist;With upper extremity assist    Stand to Sit  4: Min assist;Uncontrolled descent      Exercises   Exercises  Shoulder      Shoulder Exercises: Supine   Protraction  PROM;5 reps;AAROM;10 reps     Horizontal ABduction  PROM;5 reps;AAROM;10 reps    External Rotation  PROM;5 reps;AROM;10 reps    Internal Rotation  PROM;5 reps;AROM;10 reps    Flexion  PROM;5 reps;AAROM;10 reps    ABduction  PROM;5 reps      Shoulder Exercises: Isometric Strengthening   Flexion  Supine;3X5"    Extension  Supine;3X5"    External Rotation  Supine;3X5"    Internal Rotation  Supine;3X5"    ABduction  Supine;3X5"    ADduction  Supine;3X5"      Functional Reaching Activities   Mid Level  Pt completed reaching activity reaching for cones held at shoulder level working on flexion and horizontal abduction. Mod difficulty reaching cones and fatiguing easily               OT Short Term Goals - 11/12/17 1550      OT SHORT TERM GOAL #1   Title  Patient will be educated and independent with an HEP for improved functional use of LUE with all daily tasks.     Time  4    Period  Weeks    Status  On-going      OT SHORT TERM GOAL #2   Title  Patient will increase A/ROM of LUE to [facility] to increase functional ability during dressing tasks.     Time  4    Period  Weeks    Status  On-going      OT SHORT TERM GOAL #3   Title  Patient will increase LUE strength to 4-/5 to increase ability to complete bathing tasks with less assistance.     Time  4    Period  Weeks    Status  On-going      OT SHORT TERM GOAL #4   Title  Patient will increase grip strength by 5# and pinch strength by 2# in left hand to increase ability to complete tasks such as squeezing toothpaste onto a toothbrush with less difficulty.     Time  4    Period  Weeks    Status  On-going      OT SHORT TERM GOAL #5   Status  On-going        OT Long Term Goals - 11/12/17 1550      OT LONG TERM GOAL #1   Title  Patient will increase functional performance during all daily tasks such as bathing, dressing and grooming requiring only Min assistance as needed from family.    Time  8    Period  Weeks    Status  On-going      OT  LONG TERM GOAL #2   Title  Patient will increase LUE strength to 4/5 in order to increase ability to complete dressing tasks with less assistance.     Time  8    Period  Weeks    Status  On-going      OT LONG TERM GOAL #3   Title  Patient will increase grip strength by 8# and pinch strength by 5# in left hand in order to be able to hold onto items without dropping them.    Time  8    Period  Weeks    Status  On-going            Plan - 11/12/17 1547    Clinical Impression Statement  A: Initiated isometrics, AA/ROM, and functional reaching tasks this session. Pt requiring increased time for task completion, max difficulty with form during supine AA/ROM-OT providing tactile guidance and facilitation as needed. Pt requiring verbal cuing and min assist for transfer tasks to and from wheelchair.     Plan  P: Continue to attempt AA/ROM adding to HEP when pt able to complete with good form.        Patient will benefit from skilled therapeutic intervention in order to improve the following deficits and impairments:  Decreased endurance, Decreased activity tolerance, Decreased strength, Decreased range of motion, Impaired UE functional use  Visit Diagnosis: Stiffness of left shoulder, not elsewhere classified  Other symptoms and signs involving the musculoskeletal system    Problem List Patient Active Problem List   Diagnosis Date Noted  . Oral candida 01/08/2017  . Goals of care, counseling/discussion   . Glioblastoma multiforme of frontal lobe ([place]) 12/01/2016  . 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 Left-sided weakness 11/20/2016  . 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 HYPERCHOLESTEROLEMIA, BORDERLINE 07/28/2008  . ANEMIA, MILD 07/28/2008  . DIVERTICULOSIS OF COLON 07/28/2008  . HEMATURIA, HX OF 07/28/2008  . COLONIC POLYPS 07/27/2008  . RIGHT BUNDLE BRANCH BLOCK 07/27/2008  . PREMATURE ATRIAL CONTRACTIONS 07/27/2008  .  HEMORRHOIDS 07/27/2008  . VENOUS INSUFFICIENCY 07/27/2008  .  BACK PAIN, LUMBAR 07/27/2008  . SCOLIOSIS 07/27/2008  . PALPITATIONS, HX OF 07/27/2008   Guadelupe Sabin, OTR/L  [phone] 11/12/2017, 3:51 PM  [place] [address] [place], [place], [address] Phone: [phone]   Fax:  [phone]  Name: Tytiana Coles MRN: [id number] Date of Birth: Sep 08, 1941

## 2017-11-16 ENCOUNTER — Ambulatory Visit (HOSPITAL_COMMUNITY): Payer: Medicare Other | Admitting: Physical Therapy

## 2017-11-16 ENCOUNTER — Ambulatory Visit (HOSPITAL_COMMUNITY): Payer: Medicare Other | Admitting: Specialist

## 2017-11-16 ENCOUNTER — Encounter (HOSPITAL_COMMUNITY): Payer: Medicare Other | Admitting: Specialist

## 2017-11-16 ENCOUNTER — Telehealth (HOSPITAL_COMMUNITY): Payer: Self-pay | Admitting: Physical Therapy

## 2017-11-16 NOTE — Telephone Encounter (Signed)
Called pt.  Husband is her transportation and he is sick.  She will try and make it back on Wed.  Rayetta Humphrey, [place] CLT [phone]

## 2017-11-18 ENCOUNTER — Ambulatory Visit (HOSPITAL_COMMUNITY): Payer: Medicare Other | Attending: Nurse Practitioner

## 2017-11-18 ENCOUNTER — Encounter (HOSPITAL_COMMUNITY): Payer: Self-pay | Admitting: Occupational Therapy

## 2017-11-18 ENCOUNTER — Encounter (HOSPITAL_COMMUNITY): Payer: Self-pay

## 2017-11-18 ENCOUNTER — Ambulatory Visit (HOSPITAL_COMMUNITY): Payer: Medicare Other | Admitting: Occupational Therapy

## 2017-11-18 ENCOUNTER — Encounter (HOSPITAL_COMMUNITY): Payer: Medicare Other | Admitting: Physical Therapy

## 2017-11-18 ENCOUNTER — Other Ambulatory Visit: Payer: Self-pay

## 2017-11-18 DIAGNOSIS — R29898 Other symptoms and signs involving the musculoskeletal system: Secondary | ICD-10-CM | POA: Insufficient documentation

## 2017-11-18 DIAGNOSIS — M25612 Stiffness of left shoulder, not elsewhere classified: Secondary | ICD-10-CM | POA: Diagnosis present

## 2017-11-18 DIAGNOSIS — R2689 Other abnormalities of gait and mobility: Secondary | ICD-10-CM | POA: Diagnosis present

## 2017-11-18 DIAGNOSIS — M6281 Muscle weakness (generalized): Secondary | ICD-10-CM | POA: Diagnosis not present

## 2017-11-18 DIAGNOSIS — R2681 Unsteadiness on feet: Secondary | ICD-10-CM | POA: Diagnosis present

## 2017-11-18 DIAGNOSIS — R278 Other lack of coordination: Secondary | ICD-10-CM | POA: Diagnosis present

## 2017-11-18 NOTE — Therapy (Addendum)
[place], [place], [address] Phone: [phone]   Fax:  [phone]  Occupational Therapy Treatment  Patient Details  Name: Donna Valentine MRN: [id number] Date of Birth: 1941/09/27 Referring Provider: Dr. Nira Retort FNP   Encounter Date: 11/18/2017  OT End of Session - 11/18/17 1019    Visit Number  3    Number of Visits  16    Date for OT Re-Evaluation  12/31/17 mini reassess: 12/03/17    Authorization Type  [employer] medicare $20 co-pay for OT (stands alone)    OT Start Time  [phone]    OT Stop Time  1030    OT Time Calculation (min)  41 min    Activity Tolerance  Patient tolerated treatment well    Behavior During Therapy  [facility] for tasks assessed/performed       Past Medical History:  Diagnosis Date  . GBM (glioblastoma multiforme) ([place])     Past Surgical History:  Procedure Laterality Date  . APPENDECTOMY  1994  . APPLICATION OF CRANIAL NAVIGATION N/A 11/28/2016   Procedure: APPLICATION OF CRANIAL NAVIGATION;  Surgeon: Ashok Pall, MD;  Location: [place];  Service: Neurosurgery;  Laterality: N/A;  . CESAREAN SECTION     x3  . CRANIOTOMY Right 11/28/2016   Procedure: STERIOTACTIC PARIETAL CRANIOTOMY FOR RIGHT FRONTAL TUMOR RESECTION WITH BRAINLAB;  Surgeon: Ashok Pall, MD;  Location: [place];  Service: Neurosurgery;  Laterality: Right;    There were no vitals filed for this visit.  Subjective Assessment - 11/18/17 0951    Subjective   S: I haven't done any arm exercises, just walking.     Currently in Pain?  No/denies         [facility] OT Assessment - 11/18/17 0951      Assessment   Medical Diagnosis  left side weakness      Precautions   Precautions  Other (comment);Fall    Precaution Comments  Active Cancer               OT Treatments/Exercises (OP) - 11/18/17 1001      Exercises   Exercises  Shoulder;Hand;Theraputty      Shoulder Exercises: Supine   Protraction  PROM;5  reps;AAROM;10 reps    Horizontal ABduction  PROM;5 reps;AAROM;10 reps    External Rotation  PROM;5 reps;AROM;10 reps    Internal Rotation  PROM;5 reps;AROM;10 reps    Flexion  PROM;5 reps;AAROM;10 reps    ABduction  PROM;5 reps      Shoulder Exercises: Isometric Strengthening   Flexion  3X5" seated    Extension  3X5" seated    External Rotation  3X5" seated    Internal Rotation  3X5" seated    ABduction  3X5" seated    ADduction  3X5" seated      Hand Exercises   Other Hand Exercises  pt used pvc pipe to cut circles/holes into yellow putty working on grip strengthening      Theraputty   Theraputty - Flatten  yellow               OT Short Term Goals - 11/12/17 1550      OT SHORT TERM GOAL #1   Title  Patient will be educated and independent with an HEP for improved functional use of LUE with all daily tasks.     Time  4    Period  Weeks    Status  On-going      OT  SHORT TERM GOAL #2   Title  Patient will increase A/ROM of LUE to [facility] to increase functional ability during dressing tasks.     Time  4    Period  Weeks    Status  On-going      OT SHORT TERM GOAL #3   Title  Patient will increase LUE strength to 4-/5 to increase ability to complete bathing tasks with less assistance.     Time  4    Period  Weeks    Status  On-going      OT SHORT TERM GOAL #4   Title  Patient will increase grip strength by 5# and pinch strength by 2# in left hand to increase ability to complete tasks such as squeezing toothpaste onto a toothbrush with less difficulty.     Time  4    Period  Weeks    Status  On-going      OT SHORT TERM GOAL #5   Status  On-going        OT Long Term Goals - 11/12/17 1550      OT LONG TERM GOAL #1   Title  Patient will increase functional performance during all daily tasks such as bathing, dressing and grooming requiring only Min assistance as needed from family.    Time  8    Period  Weeks    Status  On-going      OT LONG TERM GOAL #2    Title  Patient will increase LUE strength to 4/5 in order to increase ability to complete dressing tasks with less assistance.     Time  8    Period  Weeks    Status  On-going      OT LONG TERM GOAL #3   Title  Patient will increase grip strength by 8# and pinch strength by 5# in left hand in order to be able to hold onto items without dropping them.    Time  8    Period  Weeks    Status  On-going            Plan - 11/18/17 1016    Clinical Impression Statement  A: Continued with AA/ROM this session, pt with mod/max difficulty with form due to fatigue during task. Frequent rest breaks provided during exercises. Added yellow theraputty task at table to work on left grip strength. Did not update HEP for AA/ROM due to difficulty with form.     Plan  P: Update HEP for AA/ROM, continue working on form and LUE strength        Patient will benefit from skilled therapeutic intervention in order to improve the following deficits and impairments:  Decreased endurance, Decreased activity tolerance, Decreased strength, Decreased range of motion, Impaired UE functional use  Visit Diagnosis: Other symptoms and signs involving the musculoskeletal system  Stiffness of left shoulder, not elsewhere classified    Problem List Patient Active Problem List   Diagnosis Date Noted  . Oral candida 01/08/2017  . Goals of care, counseling/discussion   . Glioblastoma multiforme of frontal lobe ([place]) 12/01/2016  . 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 Left-sided weakness 11/20/2016  . 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 HYPERCHOLESTEROLEMIA, BORDERLINE 07/28/2008  . ANEMIA, MILD 07/28/2008  . DIVERTICULOSIS OF COLON 07/28/2008  . HEMATURIA, HX OF 07/28/2008  . COLONIC POLYPS 07/27/2008  . RIGHT BUNDLE BRANCH BLOCK 07/27/2008  . PREMATURE ATRIAL CONTRACTIONS 07/27/2008  . HEMORRHOIDS 07/27/2008  .  VENOUS INSUFFICIENCY 07/27/2008  . BACK PAIN,  LUMBAR 07/27/2008  . SCOLIOSIS 07/27/2008  . PALPITATIONS, HX OF 07/27/2008   Guadelupe Sabin, OTR/L  [phone] 11/18/2017, 11:13 AM OCCUPATIONAL THERAPY DISCHARGE SUMMARY 06/17/18 Visits from Start of Care:3 Current functional level related to goals / functional outcomes: See above   Remaining deficits: See above   Education / Equipment: See above  Plan: Patient agrees to discharge.  Patient goals were not met. Patient is being discharged due to not returning since the last visit.  ?????        Patient has not returned to clinic since 11/18/17. Vangie Bicker, [place], OTR/L [place] [address] [place], [place], [address] Phone: [phone]   Fax:  [phone]  Name: Donna Valentine MRN: [id number] Date of Birth: [birth year]

## 2017-11-18 NOTE — Therapy (Addendum)
[place], [place], [address] Phone: [phone]   Fax:  [phone]  Physical Therapy Treatment/Discharge Summary  Patient Details  Name: Donna Valentine MRN: [id number] Date of Birth: 1941/06/28 Referring Provider: Dr. Nira Retort FNP   PHYSICAL THERAPY DISCHARGE SUMMARY  Visits from Start of Care: 4  Current functional level related to goals / functional outcomes: I called Donna Valentine on the home phone number we have on file and spoke with her directly. I informed her she had missed her PT appointment for this morning at 8:15 AM and informed her she has an OT appointment scheduled for 9:00 AM today as well. I stated that we were aware she has not been feeling well recently and is undergoing more treatments for her glioblastoma. I asked her if she would like to continue with therapy of discharge until she starts to feel better and return with a new MD referral at a later time. She stated she would like to be discharged at this time and will come back if she begins feeling better. I informed [name], her OT for today, that Donna Valentine will not be in today and would like to be discharged from OT.   Remaining deficits: See below details for muscle weakness, balance, and gait deficits.    Education / Equipment: Educated she can return to PT or OT with a new MD referral when she feels physically ready.  Plan: Patient agrees to discharge.  Patient goals were not met. Patient is being discharged due to a change in medical status. patient is receiving infusion treatments and has been feeling poorly limiting her from being able to attend/participate in therapy ?????     Kipp Brood, PT, DPT Physical Therapist with [facility]  12/14/2017 8:45 AM     Encounter Date: 11/18/2017  PT End of Session - 11/18/17 1226    Visit Number  4    Number of Visits  17    Date for PT  Re-Evaluation  12/03/17    Authorization Type  [employer]    Authorization Time Period  11/05/17 - 12/31/17    Authorization - Visit Number  4    Authorization - Number of Visits  10    PT Start Time  0906    PT Stop Time  0946    PT Time Calculation (min)  40 min    Equipment Utilized During Treatment  Gait belt RW     Activity Tolerance  Patient tolerated treatment well;Patient limited by fatigue    Behavior During Therapy  WFL for tasks assessed/performed       Past Medical History:  Diagnosis Date  . GBM (glioblastoma multiforme) ([place])     Past Surgical History:  Procedure Laterality Date  . APPENDECTOMY  1994  . APPLICATION OF CRANIAL NAVIGATION N/A 11/28/2016   Procedure: APPLICATION OF CRANIAL NAVIGATION;  Surgeon: Ashok Pall, MD;  Location: [facility];  Service: Neurosurgery;  Laterality: N/A;  . CESAREAN SECTION     x3  . CRANIOTOMY Right 11/28/2016   Procedure: STERIOTACTIC PARIETAL CRANIOTOMY FOR RIGHT FRONTAL TUMOR RESECTION WITH BRAINLAB;  Surgeon: Ashok Pall, MD;  Location: [facility];  Service: Neurosurgery;  Laterality: Right;    There were no vitals filed for this visit.  Subjective Assessment - 11/18/17 1220    Subjective  Patient reports she is doing well today. She states she has been doing her HEP at least once every day.  Pertinent History  Gliblastoma diagnosed February 2018, currently participating in Sc[place]ield trial at [facility]    Limitations  Walking;Standing;House hold activities    Patient Stated Goals  become more independent with walking again    Currently in Pain?  No/denies        [facility] Adult PT Treatment/Exercise - 11/18/17 0001      Bed Mobility   Bed Mobility  Rolling Left;Rolling Right;Supine to Sit;Sit to Supine    Rolling Right  4: Min assist    Rolling Right Details (indicate cue type and reason)  verbal/manual cues to sequence and initiate rolling    Rolling Left  4: Min assist    Rolling Left Details (indicate  cue type and reason)  verbal/manual cues to sequence and initiate rolling    Supine to Sit  3: Mod assist    Supine to Sit Details (indicate cue type and reason)  verbal/manual cues to sequence and initiate rolling, assistance to push up from flat surface in left side-lying    Sit to Supine  3: Mod assist    Sit to Supine - Details (indicate cue type and reason)  verbal/manual cues to sequence and initiate rolling, assistance to push up from flat surface      Transfers   Sit to Stand  4: Min guard;With upper extremity assist    Sit to Stand Details  Verbal cues for sequencing;Verbal cues for technique    Sit to Stand Details (indicate cue type and reason)  cues to lean forward and push up    Stand to Sit  4: Min guard;With upper extremity assist    Stand to Sit Details (indicate cue type and reason)  Verbal cues for sequencing;Verbal cues for technique    Stand to Sit Details  cues to lean forward an reach back for safety      Ambulation/Gait   Gait Comments  1x 200 feet and 1x 100 feet with RW, cues for quad activation L>R during stance phase of gait and verbal cues for safety to maintain good proximity to RW      Knee/Hip Exercises: Seated   Long Arc Quad  Left;1 set;15 reps;Weights;Limitations    Long Arc Quad Weight  1 lbs.    Long [employer] Limitations  5 second holds    Sit to [employer]  2 sets;10 reps;with UE support no UE support for eccetnric lowering, Rt LE positioned fwd      Knee/Hip Exercises: Supine   Bridges  Strengthening;Both;2 sets;10 reps;Limitations    Bridges Limitations  5 seconds holds      Knee/Hip Exercises: Sidelying   Clams  Clamshell: 2x 10 Bil LE         PT Education - 11/18/17 1225    Education provided  Yes    Education Details  Educated on safety with sit to stand transfers. Educated on exercises throughout session.     Person(s) Educated  Patient    Methods  Explanation    Comprehension  Verbalized understanding;Returned demonstration;Need further  instruction       PT Short Term Goals - 11/10/17 1204      PT SHORT TERM GOAL #1   Title  Patient to be independent in correctly and consistently performing targeted HEP, to be updated as appropriate     Time  2    Period  Weeks    Status  On-going      PT SHORT TERM GOAL #2   Title  Patient to  be able to ambulate wiht a gait velocity => 0.6 m/s during 3MWT in order to demonstrate improved activity tolerance and demonstrate decreased dependence with ADL's/IADL's, reduce risk of hospitalization and improve community ambulation.    Time  4    Period  Weeks    Status  On-going      PT SHORT TERM GOAL #3   Title  Patient to be able to identify 5/5 safety factors/concepts in order to demonstrate good safety and reduced fall risk     Time  4    Period  Weeks    Status  On-going        PT Long Term Goals - 11/10/17 1204      PT LONG TERM GOAL #1   Title  Patient to demonstrate improved functional strength by 1 grade with MMT for all tested muscle groups in order to improve overall gait and balance.    Time  8    Period  Weeks    Status  On-going      PT LONG TERM GOAL #2   Title  Patient will ascend/descend 4 6" stairs with 1 hand rail to demosntrate improved functional mobility/strength and improved independence with stair ambulation.    Time  8    Period  Weeks    Status  On-going      PT LONG TERM GOAL #3   Title  Patient to be able to ambulate wiht a gait velocity => 0.8 m/s during 3MWT in order to demonstrate improved activity tolerance and demonstrate decreased dependence with ADL's/IADL's, reduce risk of hospitalization, reduce fall risk, and improve community ambulation.    Time  8    Period  Weeks    Status  On-going      PT LONG TERM GOAL #4   Title  Patient will improve Berg Balance score to 37/56 or higher to demosntrate decreased risk of falling.    Time  8    Period  Weeks    Status  On-going        Plan - 11/18/17 1231    Clinical Impression Statement   Patient is progressing well in physical therapy and is primarily limited by Left LE weakness and difficulty with timing/sequencing functional mobility and gait. She was able to perform isolated strengthening today to address hip extensor weakness and left quad weakness. She continue to requires cues for safe sequencing of transfers and use of RW and requires frequent therapeutic rest breaks due to fatigue. She may benefit from gait trainer for reciprocal step pattern as well as CKC strengthening of Left quad to improve activation during stance phase of gait. Cynithia will benefit from skilled PT interventions to address current impairments and improve functional strength/mobility to increase her independence and improve QOL.    Rehab Potential  Fair    PT Frequency  2x / week    PT Duration  8 weeks    PT Treatment/Interventions  ADLs/Self Care Home Management;DME Instruction;Gait training;Stair training;Functional mobility training;Therapeutic activities;Therapeutic exercise;Balance training;Neuromuscular re-education;Patient/family education;Manual techniques;Passive range of motion;Energy conservation    PT Next Visit Plan  Continue to increase strength and stabiltiy.  Challenge with balance activity as able.  Perfomr CKC quad strenghtening for left LE to improve activaiton during stance phase of gait. Continue wtih hip extensor strengthening. Update HEP with bridges next session.    PT Home Exercise Plan  Eval: seated marching, seated toe raises    Consulted and Agree with Plan of Care  Patient  Patient will benefit from skilled therapeutic intervention in order to improve the following deficits and impairments:  Abnormal gait, Decreased knowledge of use of DME, Improper body mechanics, Decreased mobility, Postural dysfunction, Decreased activity tolerance, Decreased endurance, Decreased strength, Decreased balance, Decreased safety awareness, Difficulty walking, Impaired flexibility  Visit  Diagnosis: Muscle weakness (generalized)  Other abnormalities of gait and mobility  Unsteadiness on feet  Other lack of coordination     Problem List Patient Active Problem List   Diagnosis Date Noted  . Oral candida 01/08/2017  . Goals of care, counseling/discussion   . Glioblastoma multiforme of frontal lobe ([place]) 12/01/2016  . 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 Left-sided weakness 11/20/2016  . 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 HYPERCHOLESTEROLEMIA, BORDERLINE 07/28/2008  . ANEMIA, MILD 07/28/2008  . DIVERTICULOSIS OF COLON 07/28/2008  . HEMATURIA, HX OF 07/28/2008  . COLONIC POLYPS 07/27/2008  . RIGHT BUNDLE BRANCH BLOCK 07/27/2008  . PREMATURE ATRIAL CONTRACTIONS 07/27/2008  . HEMORRHOIDS 07/27/2008  . VENOUS INSUFFICIENCY 07/27/2008  . BACK PAIN, LUMBAR 07/27/2008  . SCOLIOSIS 07/27/2008  . PALPITATIONS, HX OF 07/27/2008    Kipp Brood, PT, DPT Physical Therapist with [facility]  11/18/2017 12:32 PM    [facility] [address] [place], [place], [address] Phone: [phone]   Fax:  [phone]  Name: Jaasia Viglione MRN: [id number] Date of Birth: [birth year]

## 2017-11-19 ENCOUNTER — Ambulatory Visit
Admission: RE | Admit: 2017-11-19 | Discharge: 2017-11-19 | Disposition: A | Discharge status: Home / Self Care | Payer: Self-pay | Source: Ambulatory Visit | Attending: Internal Medicine | Admitting: Internal Medicine

## 2017-11-19 DIAGNOSIS — C711 Malignant neoplasm of frontal lobe: Secondary | ICD-10-CM

## 2017-11-23 ENCOUNTER — Other Ambulatory Visit: Payer: Self-pay | Admitting: Internal Medicine

## 2017-11-23 ENCOUNTER — Encounter (HOSPITAL_COMMUNITY): Payer: Medicare Other | Admitting: Physical Therapy

## 2017-11-23 ENCOUNTER — Ambulatory Visit (HOSPITAL_COMMUNITY): Payer: Medicare Other

## 2017-11-23 ENCOUNTER — Telehealth (HOSPITAL_COMMUNITY): Payer: Self-pay

## 2017-11-23 ENCOUNTER — Ambulatory Visit (HOSPITAL_COMMUNITY): Payer: Medicare Other | Admitting: Physical Therapy

## 2017-11-23 ENCOUNTER — Other Ambulatory Visit: Payer: Self-pay | Admitting: *Deleted

## 2017-11-23 DIAGNOSIS — C711 Malignant neoplasm of frontal lobe: Secondary | ICD-10-CM

## 2017-11-23 NOTE — Progress Notes (Signed)
START ON PATHWAY REGIMEN - Neuro     A cycle is every 14 days:     Bevacizumab   **Always confirm dose/schedule in your pharmacy ordering system**    Patient Characteristics: Glioblastoma, Anaplastic Astrocytoma, and Anaplastic Oligodendroglioma, Recurrent or Progressive, Nonsurgical Candidate, Systemic Therapy Candidate Disease Status: Recurrent or Progressive Disease Classification: Glioblastoma Treatment Classification: Nonsurgical Candidate Treatment (Nonsurgical/Adjuvant): Systemic Therapy Candidate Would you be surprised if this patient died  in the next year<= I would be surprised if this patient died in the next year Intent of Therapy: Non-Curative / Palliative Intent, Discussed with Patient

## 2017-11-23 NOTE — Telephone Encounter (Signed)
She will have an infusion thia week and can not come to PT.

## 2017-11-24 ENCOUNTER — Ambulatory Visit: Payer: Medicare Other | Admitting: Internal Medicine

## 2017-11-25 ENCOUNTER — Inpatient Hospital Stay: Payer: Medicare Other

## 2017-11-25 ENCOUNTER — Telehealth: Payer: Self-pay

## 2017-11-25 ENCOUNTER — Inpatient Hospital Stay: Payer: Medicare Other | Attending: Internal Medicine | Admitting: Internal Medicine

## 2017-11-25 VITALS — BP 130/57 | HR 59

## 2017-11-25 VITALS — BP 121/64 | HR 70 | Temp 97.7°F | Resp 18 | Ht 65.0 in | Wt 199.2 lb

## 2017-11-25 DIAGNOSIS — Z5112 Encounter for antineoplastic immunotherapy: Secondary | ICD-10-CM | POA: Diagnosis present

## 2017-11-25 DIAGNOSIS — C711 Malignant neoplasm of frontal lobe: Secondary | ICD-10-CM | POA: Diagnosis present

## 2017-11-25 DIAGNOSIS — C719 Malignant neoplasm of brain, unspecified: Secondary | ICD-10-CM

## 2017-11-25 LAB — CMP (CANCER CENTER ONLY)
ALBUMIN: 3 g/dL — AB (ref 3.5–5.0)
ALK PHOS: 75 U/L (ref 40–150)
ALT: 12 U/L (ref 0–55)
AST: 13 U/L (ref 5–34)
Anion gap: 10 (ref 3–11)
BILIRUBIN TOTAL: 0.5 mg/dL (ref 0.2–1.2)
BUN: 16 mg/dL (ref 7–26)
CO2: 25 mmol/L (ref 22–29)
Calcium: 8.9 mg/dL (ref 8.4–10.4)
Chloride: 107 mmol/L (ref 98–109)
Creatinine: 0.74 mg/dL (ref 0.60–1.10)
GFR, Est AFR Am: >60 mL/min (ref >60)
GFR, Estimated: >60 mL/min (ref >60)
GLUCOSE: 135 mg/dL (ref 70–140)
Potassium: 3.5 mmol/L (ref 3.5–5.1)
Sodium: 142 mmol/L (ref 136–145)
Total Protein: 6.4 g/dL (ref 6.4–8.3)

## 2017-11-25 LAB — UA PROTEIN, DIPSTICK - CHCC: PROTEIN: NEGATIVE mg/dL

## 2017-11-25 MED ORDER — SODIUM CHLORIDE 0.9 % IV SOLN
5.0000 mg/kg | Freq: Once | INTRAVENOUS | Status: AC
  Administered 2017-11-25: 425 mg via INTRAVENOUS
  Filled 2017-11-25: qty 16

## 2017-11-25 MED ORDER — SODIUM CHLORIDE 0.9 % IV SOLN
Freq: Once | INTRAVENOUS | Status: AC
  Administered 2017-11-25: 14:00:00 via INTRAVENOUS

## 2017-11-25 NOTE — Telephone Encounter (Signed)
Per 2/13 NO LOS

## 2017-11-25 NOTE — Patient Instructions (Addendum)
[facility] Discharge Instructions for Patients Receiving Chemotherapy  Today you received the following chemotherapy agents Avastin  To help prevent nausea and vomiting after your treatment, we encourage you to take your nausea medication as directed  If you develop nausea and vomiting that is not controlled by your nausea medication, call the clinic.   BELOW ARE SYMPTOMS THAT SHOULD BE REPORTED IMMEDIATELY:  *FEVER GREATER THAN 100.5 F  *CHILLS WITH OR WITHOUT FEVER  NAUSEA AND VOMITING THAT IS NOT CONTROLLED WITH YOUR NAUSEA MEDICATION  *UNUSUAL SHORTNESS OF BREATH  *UNUSUAL BRUISING OR BLEEDING  TENDERNESS IN MOUTH AND THROAT WITH OR WITHOUT PRESENCE OF ULCERS  *URINARY PROBLEMS  *BOWEL PROBLEMS  UNUSUAL RASH Items with * indicate a potential emergency and should be followed up as soon as possible.  Feel free to call the clinic should you have any questions or concerns. The clinic phone number is [phone].  Please show the [facility] at check-in to the Emergency Department and triage nurse.  Bevacizumab injection What is this medicine? BEVACIZUMAB (be va SIZ yoo mab) is a monoclonal antibody. It is used to treat many types of cancer. This medicine may be used for other purposes; ask your health care provider or pharmacist if you have questions. COMMON BRAND NAME(S): Avastin What should I tell my health care provider before I take this medicine? They need to know if you have any of these conditions: -diabetes -heart disease -high blood pressure -history of coughing up blood -prior anthracycline chemotherapy (e.g., doxorubicin, daunorubicin, epirubicin) -recent or ongoing radiation therapy -recent or planning to have surgery -stroke -an unusual or allergic reaction to bevacizumab, hamster proteins, mouse proteins, other medicines, foods, dyes, or preservatives -pregnant or trying to get pregnant -breast-feeding How should I use this  medicine? This medicine is for infusion into a vein. It is given by a health care professional in a hospital or clinic setting. Talk to your pediatrician regarding the use of this medicine in children. Special care may be needed. Overdosage: If you think you have taken too much of this medicine contact a poison control center or emergency room at once. NOTE: This medicine is only for you. Do not share this medicine with others. What if I miss a dose? It is important not to miss your dose. Call your doctor or health care professional if you are unable to keep an appointment. What may interact with this medicine? Interactions are not expected. This list may not describe all possible interactions. Give your health care provider a list of all the medicines, herbs, non-prescription drugs, or dietary supplements you use. Also tell them if you smoke, drink alcohol, or use illegal drugs. Some items may interact with your medicine. What should I watch for while using this medicine? Your condition will be monitored carefully while you are receiving this medicine. You will need important blood work and urine testing done while you are taking this medicine. This medicine may increase your risk to bruise or bleed. Call your doctor or health care professional if you notice any unusual bleeding. This medicine should be started at least 28 days following major surgery and the site of the surgery should be totally healed. Check with your doctor before scheduling dental work or surgery while you are receiving this treatment. Talk to your doctor if you have recently had surgery or if you have a wound that has not healed. Do not become pregnant while taking this medicine or for 6 months after  stopping it. Women should inform their doctor if they wish to become pregnant or think they might be pregnant. There is a potential for serious side effects to an unborn child. Talk to your health care professional or pharmacist for  more information. Do not breast-feed an infant while taking this medicine and for 6 months after the last dose. This medicine has caused ovarian failure in some women. This medicine may interfere with the ability to have a child. You should talk to your doctor or health care professional if you are concerned about your fertility. What side effects may I notice from receiving this medicine? Side effects that you should report to your doctor or health care professional as soon as possible: -allergic reactions like skin rash, itching or hives, swelling of the face, lips, or tongue -chest pain or chest tightness -chills -coughing up blood -high fever -seizures -severe constipation -signs and symptoms of bleeding such as bloody or black, tarry stools; red or dark-brown urine; spitting up blood or brown material that looks like coffee grounds; red spots on the skin; unusual bruising or bleeding from the eye, gums, or nose -signs and symptoms of a blood clot such as breathing problems; chest pain; severe, sudden headache; pain, swelling, warmth in the leg -signs and symptoms of a stroke like changes in vision; confusion; trouble speaking or understanding; severe headaches; sudden numbness or weakness of the face, arm or leg; trouble walking; dizziness; loss of balance or coordination -stomach pain -sweating -swelling of legs or ankles -vomiting -weight gain Side effects that usually do not require medical attention (report to your doctor or health care professional if they continue or are bothersome): -back pain -changes in taste -decreased appetite -dry skin -nausea -tiredness This list may not describe all possible side effects. Call your doctor for medical advice about side effects. You may report side effects to FDA at 1-800-FDA-1088. Where should I keep my medicine? This drug is given in a hospital or clinic and will not be stored at home. NOTE: This sheet is a summary. It may not cover  all possible information. If you have questions about this medicine, talk to your doctor, pharmacist, or health care provider.  2018 Elsevier/Gold Standard (2016-09-26 14:33:29)  

## 2017-11-25 NOTE — Progress Notes (Signed)
[facility] at [place], [place] [address] [phone]   New Patient Evaluation  Date of Service: 11/25/17 Patient Name: Donna Valentine Patient MRN: [id number] Patient DOB: [birth year] Provider: Ventura Sellers, MD  Identifying Statement:  Donna Valentine is a 77 y.o. female with right frontal glioblastoma who presents for initial consultation and evaluation.     History of Present Illness: The patient's records from the referring physician were obtained and reviewed and the patient interviewed to confirm this HPI.  Donna Valentine presents today for Avastin infusion, after progression noted on recent MRI.  Notably, she was treated with immunotherapy agent (Medicenna) via CED in August.   She describes left sided weakness that has worsened during the interval without Avastin, due to dental procedure.  She is using a walker for ambulation at this time.  She is being actively managed by Dr. Simmie Davies at [place].  Treatment/labs/imaging will be performed locally here at [facility].  Medications: Current Outpatient Medications on File Prior to Visit  Medication Sig Dispense Refill  . apixaban (ELIQUIS) 5 MG TABS tablet Take 2 tablets 10 mg by mouth twice a day for  10 days then take 1 tablet  mg by mouth twice a day    . dexamethasone (DECADRON) 1 MG tablet Take 2 mg by mouth 2 (two) times daily with a meal.     . levETIRAcetam (KEPPRA) 500 MG tablet Take 1 tablet (500 mg total) by mouth 2 (two) times daily. 60 tablet 5  . nystatin (NYSTATIN) powder Apply 100,000 g topically daily as needed.    . ondansetron (ZOFRAN) 8 MG tablet Take 1 tablet (8 mg total) by mouth every 8 (eight) hours as needed for nausea. 30 tablet 3  . pantoprazole (PROTONIX) 40 MG tablet Take 40 mg by mouth daily.    . primidone (MYSOLINE) 50 MG tablet Take 50 mg by mouth at bedtime.    . senna (SENOKOT) 8.6 MG TABS tablet Take 1 tablet by mouth 2  (two) times daily.     No current facility-administered medications on file prior to visit.     Allergies:  Allergies  Allergen Reactions  . Penicillins Other (See Comments)    Hallucination Has patient had a PCN reaction causing immediate rash, facial/tongue/throat swelling, SOB or lightheadedness with hypotension: No Has patient had a PCN reaction causing severe rash involving mucus membranes or skin necrosis: No Has patient had a PCN reaction that required hospitalization No Has patient had a PCN reaction occurring within the last 10 years: No If all of the above answers are "NO", then may proceed with Cephalosporin use.   . Food     Pineapple-Nausea/vomiting   Past Medical History:  Past Medical History:  Diagnosis Date  . GBM (glioblastoma multiforme) ([place])    Past Surgical History:  Past Surgical History:  Procedure Laterality Date  . APPENDECTOMY  1994  . APPLICATION OF CRANIAL NAVIGATION N/A 11/28/2016   Procedure: APPLICATION OF CRANIAL NAVIGATION;  Surgeon: Ashok Pall, MD;  Location: [facility];  Service: Neurosurgery;  Laterality: N/A;  . CESAREAN SECTION     x3  . CRANIOTOMY Right 11/28/2016   Procedure: STERIOTACTIC PARIETAL CRANIOTOMY FOR RIGHT FRONTAL TUMOR RESECTION WITH BRAINLAB;  Surgeon: Ashok Pall, MD;  Location: [facility];  Service: Neurosurgery;  Laterality: Right;   Social History:  Social History   Socioeconomic History  . Marital status: Married    Spouse name: Not on file  .  Number of children: Not on file  . Years of education: Not on file  . Highest education level: Not on file  Social Needs  . Financial resource strain: Not on file  . Food insecurity - worry: Not on file  . Food insecurity - inability: Not on file  . Transportation needs - medical: Not on file  . Transportation needs - non-medical: Not on file  Occupational History  . Not on file  Tobacco Use  . Smoking status: Former Smoker    Last attempt to quit: 11/26/1991    Years since  quitting: 26.0  . Smokeless tobacco: Never Used  Substance and Sexual Activity  . Alcohol use: No  . Drug use: No  . Sexual activity: Not Currently  Other Topics Concern  . Not on file  Social History Narrative  . Not on file   Family History:  Family History  Problem Relation Age of Onset  . Heart disease Mother   . Alzheimer's disease Brother   . Cancer Neg Hx     Review of Systems: Constitutional: Denies fevers, chills or abnormal weight loss Eyes: Denies blurriness of vision Ears, nose, mouth, throat, and face: Denies mucositis or sore throat Respiratory: Denies cough, dyspnea or wheezes Cardiovascular: Denies palpitation, chest discomfort or lower extremity swelling Gastrointestinal:  Denies nausea, constipation, diarrhea GU: Denies dysuria or incontinence Skin: Denies abnormal skin rashes Neurological: Per HPI Musculoskeletal: Denies joint pain, back or neck discomfort. No decrease in ROM Behavioral/Psych: Denies anxiety, disturbance in thought content, and mood instability  Physical Exam: Vitals:   11/25/17 1150  BP: 121/64  Pulse: 70  Resp: 18  Temp: 97.7 F (36.5 C)  SpO2: 100%   KPS: 60. General: Alert, cooperative, pleasant, in no acute distress Head: Craniotomy scar noted, dry and intact. EENT: No conjunctival injection or scleral icterus. Oral mucosa moist Lungs: Resp effort normal Cardiac: Regular rate and rhythm Abdomen: Soft, non-distended abdomen Skin: No rashes cyanosis or petechiae. Extremities: No clubbing or edema  Neurologic Exam: Mental Status: Awake, alert, attentive to examiner. Oriented to self and environment. Language is fluent with intact comprehension.  Cranial Nerves: Visual acuity is grossly normal. Visual fields are full. Extra-ocular movements intact. No ptosis. Face is symmetric, tongue midline. Motor: Tone and bulk are normal. 3/5 in left arm, 4/5 in left leg. Noted coarse asterixis and easily inducible clonus in arm L>R  mimicking tremor. Reflexes are symmetric, no pathologic reflexes present. 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  Sensory: Intact to light touch and temperature Gait: Non-ambulatory   Labs: I have reviewed the data as listed    Component Value Date/Time   NA 142 11/25/2017 1105   NA 143 06/16/2017 0917   K 3.5 11/25/2017 1105   K 3.5 06/16/2017 0917   CL 107 11/25/2017 1105   CO2 25 11/25/2017 1105   CO2 29 06/16/2017 0917   GLUCOSE 135 11/25/2017 1105   GLUCOSE 98 06/16/2017 0917   BUN 16 11/25/2017 1105   BUN 16.1 06/16/2017 0917   CREATININE 0.74 11/25/2017 1105   CREATININE 0.9 06/16/2017 0917   CALCIUM 8.9 11/25/2017 1105   CALCIUM 9.3 06/16/2017 0917   PROT 6.4 11/25/2017 1105   PROT 6.8 06/16/2017 0917   ALBUMIN 3.0 (L) 11/25/2017 1105   ALBUMIN 3.0 (L) 06/16/2017 0917   AST 13 11/25/2017 1105   AST 15 06/16/2017 0917   ALT 12 11/25/2017 1105   ALT 13 06/16/2017 0917   ALKPHOS 75 11/25/2017 1105   ALKPHOS 74  06/16/2017 0917   BILITOT 0.5 11/25/2017 1105   BILITOT 0.44 06/16/2017 0917   GFRNONAA >60 11/25/2017 1105   GFRAA >60 11/25/2017 1105   Lab Results  Component Value Date   WBC 6.0 08/28/2017   NEUTROABS 5.0 08/28/2017   HGB 14.8 08/28/2017   HCT 45.8 08/28/2017   MCV 95.2 08/28/2017   PLT 121 (L) 08/28/2017      Assessment/Plan 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  She is clinically stable and cleared to undergo Avastin infusion today, 5mg /kg.    She will return in 2 weeks for next infusion.  Further imaging can be performed here in [place], timing will be arranged with Dr. Alvina Chou team.  All questions were answered. The patient knows to call the clinic with any problems, questions or concerns. No barriers to learning were detected.  The total time spent in the encounter was 25 minutes and more than 50% was on counseling and review of  test results   Ventura Sellers, MD Medical Director of Neuro-Oncology [facility] at [place] 11/25/17 2:17 PM

## 2017-11-26 ENCOUNTER — Ambulatory Visit (HOSPITAL_COMMUNITY): Payer: Medicare Other

## 2017-11-26 ENCOUNTER — Ambulatory Visit (HOSPITAL_COMMUNITY): Payer: Medicare Other | Admitting: Physical Therapy

## 2017-11-26 ENCOUNTER — Encounter (HOSPITAL_COMMUNITY): Payer: Medicare Other

## 2017-11-27 ENCOUNTER — Telehealth: Payer: Self-pay | Admitting: *Deleted

## 2017-11-27 NOTE — Telephone Encounter (Signed)
"  Mother was a patient of Dr. Irene Limbo till he referred her to a doctor from [place].  She is to receive Avastin every two weeks.  After she received Avastin Wednesday we were told we'd receive call the next day with appointments.  Have not received a call."   Apologized for no receipt of call from sole scheduler for infusion treatments.  Will be notified when appointments coordinated for scheduling.  "We need time to plan."

## 2017-11-30 ENCOUNTER — Other Ambulatory Visit: Payer: Self-pay | Admitting: *Deleted

## 2017-11-30 ENCOUNTER — Telehealth (HOSPITAL_COMMUNITY): Payer: Self-pay | Admitting: General Practice

## 2017-11-30 DIAGNOSIS — C711 Malignant neoplasm of frontal lobe: Secondary | ICD-10-CM

## 2017-11-30 NOTE — Telephone Encounter (Signed)
11/30/17  daughter called to cx because she said that patient cannot move her left side at all and they have had to call [facility] today

## 2017-12-01 ENCOUNTER — Ambulatory Visit (HOSPITAL_COMMUNITY): Payer: Medicare Other

## 2017-12-01 ENCOUNTER — Encounter (HOSPITAL_COMMUNITY): Payer: Medicare Other

## 2017-12-03 ENCOUNTER — Ambulatory Visit (HOSPITAL_COMMUNITY): Payer: Medicare Other

## 2017-12-03 ENCOUNTER — Encounter (HOSPITAL_COMMUNITY): Payer: Medicare Other

## 2017-12-03 ENCOUNTER — Telehealth (HOSPITAL_COMMUNITY): Payer: Self-pay | Admitting: Physical Therapy

## 2017-12-03 ENCOUNTER — Telehealth (HOSPITAL_COMMUNITY): Payer: Self-pay | Admitting: General Practice

## 2017-12-03 ENCOUNTER — Ambulatory Visit (HOSPITAL_COMMUNITY): Payer: Medicare Other | Admitting: Physical Therapy

## 2017-12-03 NOTE — Telephone Encounter (Signed)
Pt did not show for appointment.  Called and spoke to patient who states she is not doing good, no movement on Lt side and going back to [place].  Requested we cancel her next appt on Monday (2/25) and keep her Thursday one for now (2/28).  Teena Irani, PTA/CLT [phone]

## 2017-12-03 NOTE — Telephone Encounter (Signed)
12/03/17  Amy called pt since she didn't show for her 9:45 appt and was told she still wasn't feeling that good

## 2017-12-07 ENCOUNTER — Encounter (HOSPITAL_COMMUNITY): Payer: Medicare Other | Admitting: Physical Therapy

## 2017-12-07 ENCOUNTER — Ambulatory Visit (HOSPITAL_COMMUNITY): Payer: Medicare Other

## 2017-12-08 ENCOUNTER — Inpatient Hospital Stay (HOSPITAL_BASED_OUTPATIENT_CLINIC_OR_DEPARTMENT_OTHER): Payer: Medicare Other | Admitting: Internal Medicine

## 2017-12-08 ENCOUNTER — Inpatient Hospital Stay: Payer: Medicare Other

## 2017-12-08 VITALS — BP 148/67 | HR 58

## 2017-12-08 VITALS — BP 142/66 | HR 62 | Temp 97.6°F | Resp 62 | Ht 65.0 in

## 2017-12-08 DIAGNOSIS — C711 Malignant neoplasm of frontal lobe: Secondary | ICD-10-CM | POA: Diagnosis not present

## 2017-12-08 DIAGNOSIS — Z5112 Encounter for antineoplastic immunotherapy: Secondary | ICD-10-CM | POA: Diagnosis not present

## 2017-12-08 LAB — CMP (CANCER CENTER ONLY)
ALT: 17 U/L (ref 0–55)
ANION GAP: 10 (ref 3–11)
AST: 14 U/L (ref 5–34)
Albumin: 3.2 g/dL — ABNORMAL LOW (ref 3.5–5.0)
Alkaline Phosphatase: 82 U/L (ref 40–150)
BILIRUBIN TOTAL: 0.4 mg/dL (ref 0.2–1.2)
BUN: 22 mg/dL (ref 7–26)
CO2: 25 mmol/L (ref 22–29)
Calcium: 9 mg/dL (ref 8.4–10.4)
Chloride: 106 mmol/L (ref 98–109)
Creatinine: 0.73 mg/dL (ref 0.60–1.10)
Glucose, Bld: 149 mg/dL — ABNORMAL HIGH (ref 70–140)
POTASSIUM: 3.8 mmol/L (ref 3.5–5.1)
Sodium: 141 mmol/L (ref 136–145)
Total Protein: 6.8 g/dL (ref 6.4–8.3)

## 2017-12-08 LAB — CBC (CANCER CENTER ONLY)
HEMATOCRIT: 46 % (ref 34.8–46.6)
Hemoglobin: 14.8 g/dL (ref 11.6–15.9)
MCH: 30.4 pg (ref 25.1–34.0)
MCHC: 32.3 g/dL (ref 31.5–36.0)
MCV: 94 fL (ref 79.5–101.0)
PLATELETS: 177 10*3/uL (ref 145–400)
RBC: 4.89 MIL/uL (ref 3.70–5.45)
RDW: 15.2 % — AB (ref 11.2–14.5)
WBC Count: 10.1 10*3/uL (ref 3.9–10.3)

## 2017-12-08 LAB — TOTAL PROTEIN, URINE DIPSTICK: Protein, ur: NEGATIVE mg/dL

## 2017-12-08 MED ORDER — SODIUM CHLORIDE 0.9 % IV SOLN: Freq: Once | INTRAVENOUS | Status: DC

## 2017-12-08 MED ORDER — SODIUM CHLORIDE 0.9 % IV SOLN
5.0000 mg/kg | Freq: Once | INTRAVENOUS | Status: AC
  Administered 2017-12-08: 425 mg via INTRAVENOUS
  Filled 2017-12-08: qty 16

## 2017-12-08 NOTE — Progress Notes (Signed)
[facility] at [place], [place] [address] [phone]   Interval Evaluation  Date of Service: 12/08/17 Patient Name: Donna Valentine Patient MRN: [id number] Patient DOB: 1941-03-22 Provider: Ventura Sellers, MD  Identifying Statement:  Donna Valentine is a 77 y.o. female with right frontal glioblastoma   Interval History:  Donna Valentine presents today for second Avastin infusion.  She continues to use a walker for ambulation.  She is being actively managed by Dr. Simmie Davies at [place].  They have increased her decadron to 2mg  BID because of persistent left sided weakness. Treatment/labs/imaging will be performed locally here at [facility].    Medications: Current Outpatient Medications on File Prior to Visit  Medication Sig Dispense Refill  . apixaban (ELIQUIS) 5 MG TABS tablet Take 2 tablets 10 mg by mouth twice a day for  10 days then take 1 tablet  mg by mouth twice a day    . dexamethasone (DECADRON) 1 MG tablet Take 2 mg by mouth 2 (two) times daily with a meal.     . levETIRAcetam (KEPPRA) 500 MG tablet Take 1 tablet (500 mg total) by mouth 2 (two) times daily. 60 tablet 5  . nystatin (NYSTATIN) powder Apply 100,000 g topically daily as needed.    . ondansetron (ZOFRAN) 8 MG tablet Take 1 tablet (8 mg total) by mouth every 8 (eight) hours as needed for nausea. 30 tablet 3  . pantoprazole (PROTONIX) 40 MG tablet Take 40 mg by mouth daily.    . primidone (MYSOLINE) 50 MG tablet Take 50 mg by mouth at bedtime.    . senna (SENOKOT) 8.6 MG TABS tablet Take 1 tablet by mouth 2 (two) times daily.     No current facility-administered medications on file prior to visit.     Allergies:  Allergies  Allergen Reactions  . Penicillins Other (See Comments)    Hallucination Has patient had a PCN reaction causing immediate rash, facial/tongue/throat swelling, SOB or lightheadedness with hypotension: No Has patient had  a PCN reaction causing severe rash involving mucus membranes or skin necrosis: No Has patient had a PCN reaction that required hospitalization No Has patient had a PCN reaction occurring within the last 10 years: No If all of the above answers are "NO", then may proceed with Cephalosporin use.   . Food     Pineapple-Nausea/vomiting   Past Medical History:  Past Medical History:  Diagnosis Date  . GBM (glioblastoma multiforme) ([place])    Past Surgical History:  Past Surgical History:  Procedure Laterality Date  . APPENDECTOMY  1994  . APPLICATION OF CRANIAL NAVIGATION N/A 11/28/2016   Procedure: APPLICATION OF CRANIAL NAVIGATION;  Surgeon: Ashok Pall, MD;  Location: [facility];  Service: Neurosurgery;  Laterality: N/A;  . CESAREAN SECTION     x3  . CRANIOTOMY Right 11/28/2016   Procedure: STERIOTACTIC PARIETAL CRANIOTOMY FOR RIGHT FRONTAL TUMOR RESECTION WITH BRAINLAB;  Surgeon: Ashok Pall, MD;  Location: [facility];  Service: Neurosurgery;  Laterality: Right;   Social History:  Social History   Socioeconomic History  . Marital status: Married    Spouse name: Not on file  . Number of children: Not on file  . Years of education: Not on file  . Highest education level: Not on file  Social Needs  . Financial resource strain: Not on file  . Food insecurity - worry: Not on file  . Food insecurity - inability: Not on file  .  Transportation needs - medical: Not on file  . Transportation needs - non-medical: Not on file  Occupational History  . Not on file  Tobacco Use  . Smoking status: Former Smoker    Last attempt to quit: 11/26/1991    Years since quitting: 26.0  . Smokeless tobacco: Never Used  Substance and Sexual Activity  . Alcohol use: No  . Drug use: No  . Sexual activity: Not Currently  Other Topics Concern  . Not on file  Social History Narrative  . Not on file   Family History:  Family History  Problem Relation Age of Onset  . Heart disease Mother   . Alzheimer's  disease Brother   . Cancer Neg Hx     Review of Systems: Constitutional: Denies fevers, chills or abnormal weight loss Eyes: Denies blurriness of vision Ears, nose, mouth, throat, and face: Denies mucositis or sore throat Respiratory: Denies cough, dyspnea or wheezes Cardiovascular: Denies palpitation, chest discomfort or lower extremity swelling Gastrointestinal:  Denies nausea, constipation, diarrhea GU: Denies dysuria or incontinence Skin: Denies abnormal skin rashes Neurological: Per HPI Musculoskeletal: Denies joint pain, back or neck discomfort. No decrease in ROM Behavioral/Psych: Denies anxiety, disturbance in thought content, and mood instability  Physical Exam: There were no vitals filed for this visit. KPS: 60. General: Alert, cooperative, pleasant, in no acute distress Head: Craniotomy scar noted, dry and intact. EENT: No conjunctival injection or scleral icterus. Oral mucosa moist Lungs: Resp effort normal Cardiac: Regular rate and rhythm Abdomen: Soft, non-distended abdomen Skin: No rashes cyanosis or petechiae. Extremities: No clubbing or edema  Neurologic Exam: Mental Status: Awake, alert, attentive to examiner. Oriented to self and environment. Language is fluent with intact comprehension.  Cranial Nerves: Visual acuity is grossly normal. Visual fields are full. Extra-ocular movements intact. No ptosis. Face is symmetric, tongue midline. Motor: Tone and bulk are normal. 3/5 in left arm, 4/5 in left leg. Noted coarse asterixis and easily inducible clonus in arm L>R mimicking tremor. Reflexes are symmetric, no pathologic reflexes present. 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  Sensory: Intact to light touch and temperature Gait: Non-ambulatory   Labs: I have reviewed the data as listed    Component Value Date/Time   NA 142 11/25/2017 1105   NA 143 06/16/2017 0917   K 3.5 11/25/2017 1105   K 3.5 06/16/2017 0917   CL 107 11/25/2017 1105   CO2 25 11/25/2017 1105     CO2 29 06/16/2017 0917   GLUCOSE 135 11/25/2017 1105   GLUCOSE 98 06/16/2017 0917   BUN 16 11/25/2017 1105   BUN 16.1 06/16/2017 0917   CREATININE 0.74 11/25/2017 1105   CREATININE 0.9 06/16/2017 0917   CALCIUM 8.9 11/25/2017 1105   CALCIUM 9.3 06/16/2017 0917   PROT 6.4 11/25/2017 1105   PROT 6.8 06/16/2017 0917   ALBUMIN 3.0 (L) 11/25/2017 1105   ALBUMIN 3.0 (L) 06/16/2017 0917   AST 13 11/25/2017 1105   AST 15 06/16/2017 0917   ALT 12 11/25/2017 1105   ALT 13 06/16/2017 0917   ALKPHOS 75 11/25/2017 1105   ALKPHOS 74 06/16/2017 0917   BILITOT 0.5 11/25/2017 1105   BILITOT 0.44 06/16/2017 0917   GFRNONAA >60 11/25/2017 1105   GFRAA >60 11/25/2017 1105   Lab Results  Component Value Date   WBC 6.0 08/28/2017   NEUTROABS 5.0 08/28/2017   HGB 14.8 08/28/2017   HCT 45.8 08/28/2017   MCV 95.2 08/28/2017   PLT 121 (L) 08/28/2017  Assessment/Plan 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  She is clinically stable and cleared to undergo Avastin infusion today, 5mg /kg.    She will return in 2 weeks for next infusion.  Further imaging can be performed here in [place], timing will be arranged with Dr. Alvina Chou team.  All questions were answered. The patient knows to call the clinic with any problems, questions or concerns. No barriers to learning were detected.  The total time spent in the encounter was 25 minutes and more than 50% was on counseling and review of test results   Ventura Sellers, MD Medical Director of Neuro-Oncology [facility] at [place] 12/08/17 12:39 PM

## 2017-12-08 NOTE — Patient Instructions (Signed)
[facility] Discharge Instructions for Patients Receiving Chemotherapy  Today you received the following chemotherapy agents Avastin  To help prevent nausea and vomiting after your treatment, we encourage you to take your nausea medication as directed   If you develop nausea and vomiting that is not controlled by your nausea medication, call the clinic.   BELOW ARE SYMPTOMS THAT SHOULD BE REPORTED IMMEDIATELY:  *FEVER GREATER THAN 100.5 F  *CHILLS WITH OR WITHOUT FEVER  NAUSEA AND VOMITING THAT IS NOT CONTROLLED WITH YOUR NAUSEA MEDICATION  *UNUSUAL SHORTNESS OF BREATH  *UNUSUAL BRUISING OR BLEEDING  TENDERNESS IN MOUTH AND THROAT WITH OR WITHOUT PRESENCE OF ULCERS  *URINARY PROBLEMS  *BOWEL PROBLEMS  UNUSUAL RASH Items with * indicate a potential emergency and should be followed up as soon as possible.  Feel free to call the clinic should you have any questions or concerns. The clinic phone number is [phone].  Please show the CHEMO ALERT CARD at check-in to the Emergency Department and triage nurse.   

## 2017-12-09 ENCOUNTER — Ambulatory Visit: Payer: Medicare Other | Admitting: Internal Medicine

## 2017-12-09 ENCOUNTER — Other Ambulatory Visit: Payer: Medicare Other

## 2017-12-09 ENCOUNTER — Ambulatory Visit: Payer: Medicare Other

## 2017-12-10 ENCOUNTER — Telehealth (HOSPITAL_COMMUNITY): Payer: Self-pay | Admitting: General Practice

## 2017-12-10 ENCOUNTER — Encounter (HOSPITAL_COMMUNITY): Payer: Medicare Other

## 2017-12-10 ENCOUNTER — Ambulatory Visit (HOSPITAL_COMMUNITY): Payer: Medicare Other

## 2017-12-10 NOTE — Telephone Encounter (Signed)
12/10/17 pt cx via phone tree

## 2017-12-14 ENCOUNTER — Telehealth (HOSPITAL_COMMUNITY): Payer: Self-pay

## 2017-12-14 ENCOUNTER — Ambulatory Visit (HOSPITAL_COMMUNITY): Payer: Medicare Other

## 2017-12-14 NOTE — Telephone Encounter (Signed)
I called Donna Valentine on the home phone number we have on file and spoke with her directly. I informed her she had missed her PT appointment for this morning at 8:15 AM and informed her she has an OT appointment scheduled for 9:00 AM today as well. I stated that we were aware she has not been feeling well recently and is undergoing more treatments for her glioblastoma. I asked her if she would like to continue with therapy of discharge until she starts to feel better and return with a new MD referral at a later time. She stated she would like to be discharged at this time and will come back if she begins feeling better. I informed Donna Valentine, her OT for today, that Donna Valentine will not be in today and would like to be discharged from OT.  Kipp Brood, PT, DPT Physical Therapist with [facility]  12/14/2017 8:41 AM

## 2017-12-16 ENCOUNTER — Ambulatory Visit (HOSPITAL_COMMUNITY): Payer: Medicare Other

## 2017-12-21 ENCOUNTER — Encounter (HOSPITAL_COMMUNITY): Payer: Medicare Other | Admitting: Specialist

## 2017-12-21 ENCOUNTER — Encounter (HOSPITAL_COMMUNITY): Payer: Medicare Other

## 2017-12-23 ENCOUNTER — Encounter (HOSPITAL_COMMUNITY): Payer: Medicare Other

## 2017-12-23 ENCOUNTER — Other Ambulatory Visit: Payer: Medicare Other

## 2017-12-23 ENCOUNTER — Ambulatory Visit: Payer: Medicare Other

## 2017-12-23 ENCOUNTER — Other Ambulatory Visit: Payer: Self-pay

## 2017-12-23 ENCOUNTER — Ambulatory Visit: Payer: Medicare Other | Admitting: Internal Medicine

## 2017-12-23 DIAGNOSIS — C711 Malignant neoplasm of frontal lobe: Secondary | ICD-10-CM

## 2017-12-24 ENCOUNTER — Inpatient Hospital Stay (HOSPITAL_BASED_OUTPATIENT_CLINIC_OR_DEPARTMENT_OTHER): Payer: Medicare Other | Admitting: Internal Medicine

## 2017-12-24 ENCOUNTER — Inpatient Hospital Stay: Payer: Medicare Other

## 2017-12-24 ENCOUNTER — Inpatient Hospital Stay: Payer: Medicare Other | Attending: Internal Medicine

## 2017-12-24 ENCOUNTER — Other Ambulatory Visit: Payer: Self-pay | Admitting: *Deleted

## 2017-12-24 ENCOUNTER — Telehealth: Payer: Self-pay | Admitting: Internal Medicine

## 2017-12-24 ENCOUNTER — Encounter: Payer: Self-pay | Admitting: Internal Medicine

## 2017-12-24 VITALS — BP 126/67 | HR 70 | Temp 98.2°F | Resp 24 | Ht 65.0 in

## 2017-12-24 DIAGNOSIS — Z87891 Personal history of nicotine dependence: Secondary | ICD-10-CM

## 2017-12-24 DIAGNOSIS — C711 Malignant neoplasm of frontal lobe: Secondary | ICD-10-CM

## 2017-12-24 DIAGNOSIS — Z5112 Encounter for antineoplastic immunotherapy: Secondary | ICD-10-CM | POA: Diagnosis present

## 2017-12-24 LAB — TOTAL PROTEIN, URINE DIPSTICK: Protein, ur: NEGATIVE mg/dL

## 2017-12-24 MED ORDER — SODIUM CHLORIDE 0.9 % IV SOLN
Freq: Once | INTRAVENOUS | Status: AC
  Administered 2017-12-24: 13:00:00 via INTRAVENOUS

## 2017-12-24 MED ORDER — SODIUM CHLORIDE 0.9 % IV SOLN
5.0000 mg/kg | Freq: Once | INTRAVENOUS | Status: AC
  Administered 2017-12-24: 425 mg via INTRAVENOUS
  Filled 2017-12-24: qty 16

## 2017-12-24 NOTE — Patient Instructions (Signed)
[name] Discharge Instructions for Patients Receiving Chemotherapy  Today you received the following chemotherapy agents avastin  To help prevent nausea and vomiting after your treatment, we encourage you to take your nausea medication as directed   If you develop nausea and vomiting that is not controlled by your nausea medication, call the clinic.   BELOW ARE SYMPTOMS THAT SHOULD BE REPORTED IMMEDIATELY:  *FEVER GREATER THAN 100.5 F  *CHILLS WITH OR WITHOUT FEVER  NAUSEA AND VOMITING THAT IS NOT CONTROLLED WITH YOUR NAUSEA MEDICATION  *UNUSUAL SHORTNESS OF BREATH  *UNUSUAL BRUISING OR BLEEDING  TENDERNESS IN MOUTH AND THROAT WITH OR WITHOUT PRESENCE OF ULCERS  *URINARY PROBLEMS  *BOWEL PROBLEMS  UNUSUAL RASH Items with * indicate a potential emergency and should be followed up as soon as possible.  Feel free to call the clinic you have any questions or concerns. The clinic phone number is [phone].  

## 2017-12-24 NOTE — Progress Notes (Signed)
[place] at [place], [place] [address] [phone]   Interval Evaluation  Date of Service: 12/24/17 Patient Name: Donna Valentine Patient MRN: [id number] Patient DOB: 05/02/1941 Provider: Ventura Sellers, MD  Identifying Statement:  Donna Valentine is a 77 y.o. female with right frontal glioblastoma   Interval History:  Donna Valentine presents today for third Avastin infusion.  She continues to use a walker for ambulation.  Denies new or progressive neurologic deficits.   She is being actively managed by Dr. Simmie Davies at [place]. Treatment/labs/imaging will be performed locally here at [facility].    Medications: Current Outpatient Medications on File Prior to Visit  Medication Sig Dispense Refill  . apixaban (ELIQUIS) 5 MG TABS tablet Take 2 tablets 10 mg by mouth twice a day for  10 days then take 1 tablet  mg by mouth twice a day    . dexamethasone (DECADRON) 1 MG tablet Take 2 mg by mouth 2 (two) times daily with a meal.     . levETIRAcetam (KEPPRA) 500 MG tablet Take 1 tablet (500 mg total) by mouth 2 (two) times daily. 60 tablet 5  . nystatin (NYSTATIN) powder Apply 100,000 g topically daily as needed.    . ondansetron (ZOFRAN) 8 MG tablet Take 1 tablet (8 mg total) by mouth every 8 (eight) hours as needed for nausea. 30 tablet 3  . pantoprazole (PROTONIX) 40 MG tablet Take 40 mg by mouth daily.    . primidone (MYSOLINE) 50 MG tablet Take 50 mg by mouth at bedtime.    . senna (SENOKOT) 8.6 MG TABS tablet Take 1 tablet by mouth 2 (two) times daily.     No current facility-administered medications on file prior to visit.     Allergies:  Allergies  Allergen Reactions  . Penicillins Other (See Comments)    Hallucination Has patient had a PCN reaction causing immediate rash, facial/tongue/throat swelling, SOB or lightheadedness with hypotension: No Has patient had a PCN reaction causing severe rash  involving mucus membranes or skin necrosis: No Has patient had a PCN reaction that required hospitalization No Has patient had a PCN reaction occurring within the last 10 years: No If all of the above answers are "NO", then may proceed with Cephalosporin use.   . Food     Pineapple-Nausea/vomiting   Past Medical History:  Past Medical History:  Diagnosis Date  . GBM (glioblastoma multiforme) ([place])    Past Surgical History:  Past Surgical History:  Procedure Laterality Date  . APPENDECTOMY  1994  . APPLICATION OF CRANIAL NAVIGATION N/A 11/28/2016   Procedure: APPLICATION OF CRANIAL NAVIGATION;  Surgeon: Ashok Pall, MD;  Location: [place];  Service: Neurosurgery;  Laterality: N/A;  . CESAREAN SECTION     x3  . CRANIOTOMY Right 11/28/2016   Procedure: STERIOTACTIC PARIETAL CRANIOTOMY FOR RIGHT FRONTAL TUMOR RESECTION WITH BRAINLAB;  Surgeon: Ashok Pall, MD;  Location: [place];  Service: Neurosurgery;  Laterality: Right;   Social History:  Social History   Socioeconomic History  . Marital status: Married    Spouse name: Not on file  . Number of children: Not on file  . Years of education: Not on file  . Highest education level: Not on file  Social Needs  . Financial resource strain: Not on file  . Food insecurity - worry: Not on file  . Food insecurity - inability: Not on file  . Transportation needs - medical: Not on  file  . Transportation needs - non-medical: Not on file  Occupational History  . Not on file  Tobacco Use  . Smoking status: Former Smoker    Last attempt to quit: 11/26/1991    Years since quitting: 26.0  . Smokeless tobacco: Never Used  Substance and Sexual Activity  . Alcohol use: No  . Drug use: No  . Sexual activity: Not Currently  Other Topics Concern  . Not on file  Social History Narrative  . Not on file   Family History:  Family History  Problem Relation Age of Onset  . Heart disease Mother   . Alzheimer's disease Brother   . Cancer Neg Hx       Review of Systems: Constitutional: Denies fevers, chills or abnormal weight loss Eyes: Denies blurriness of vision Ears, nose, mouth, throat, and face: Denies mucositis or sore throat Respiratory: Denies cough, dyspnea or wheezes Cardiovascular: Denies palpitation, chest discomfort or lower extremity swelling Gastrointestinal:  Denies nausea, constipation, diarrhea GU: Denies dysuria or incontinence Skin: Denies abnormal skin rashes Neurological: Per HPI Musculoskeletal: Denies joint pain, back or neck discomfort. No decrease in ROM Behavioral/Psych: Denies anxiety, disturbance in thought content, and mood instability  Physical Exam: Vitals:   12/24/17 1113  BP: 126/67  Pulse: 70  Resp: (!) 24  Temp: 98.2 F (36.8 C)  SpO2: 100%   KPS: 60. General: Alert, cooperative, pleasant, in no acute distress Head: Craniotomy scar noted, dry and intact. EENT: No conjunctival injection or scleral icterus. Oral mucosa moist Lungs: Resp effort normal Cardiac: Regular rate and rhythm Abdomen: Soft, non-distended abdomen Skin: No rashes cyanosis or petechiae. Extremities: No clubbing or edema  Neurologic Exam: Mental Status: Awake, alert, attentive to examiner. Oriented to self and environment. Language is fluent with intact comprehension.  Cranial Nerves: Visual acuity is grossly normal. Visual fields are full. Extra-ocular movements intact. No ptosis. Face is symmetric, tongue midline. Motor: Tone and bulk are normal. 3/5 in left arm, 4/5 in left leg. Noted coarse asterixis and easily inducible clonus in arm L>R mimicking tremor. Reflexes are symmetric, no pathologic reflexes present. 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  Sensory: Intact to light touch and temperature Gait: Non-ambulatory   Labs: I have reviewed the data as listed    Component Value Date/Time   NA 141 12/08/2017 1221   NA 143 06/16/2017 0917   K 3.8 12/08/2017 1221   K 3.5 06/16/2017 0917   CL 106 12/08/2017  1221   CO2 25 12/08/2017 1221   CO2 29 06/16/2017 0917   GLUCOSE 149 (H) 12/08/2017 1221   GLUCOSE 98 06/16/2017 0917   BUN 22 12/08/2017 1221   BUN 16.1 06/16/2017 0917   CREATININE 0.73 12/08/2017 1221   CREATININE 0.9 06/16/2017 0917   CALCIUM 9.0 12/08/2017 1221   CALCIUM 9.3 06/16/2017 0917   PROT 6.8 12/08/2017 1221   PROT 6.8 06/16/2017 0917   ALBUMIN 3.2 (L) 12/08/2017 1221   ALBUMIN 3.0 (L) 06/16/2017 0917   AST 14 12/08/2017 1221   AST 15 06/16/2017 0917   ALT 17 12/08/2017 1221   ALT 13 06/16/2017 0917   ALKPHOS 82 12/08/2017 1221   ALKPHOS 74 06/16/2017 0917   BILITOT 0.4 12/08/2017 1221   BILITOT 0.44 06/16/2017 0917   GFRNONAA >60 12/08/2017 1221   GFRAA >60 12/08/2017 1221   Lab Results  Component Value Date   WBC 10.1 12/08/2017   NEUTROABS 5.0 08/28/2017   HGB 14.8 08/28/2017   HCT 46.0 12/08/2017  MCV 94.0 12/08/2017   PLT 177 12/08/2017      Assessment/Plan 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  She is clinically stable and cleared to undergo Avastin infusion today, 5mg /kg.    She will return in 2 weeks for next infusion.  Next MRI will be scheduled for 01/07/18 prior to next visit at [facility] on 01/14/18.  All questions were answered. The patient knows to call the clinic with any problems, questions or concerns. No barriers to learning were detected.  The total time spent in the encounter was 25 minutes and more than 50% was on counseling and review of test results   Ventura Sellers, MD Medical Director of Neuro-Oncology [facility] at [place] 12/24/17 11:05 AM

## 2017-12-24 NOTE — Telephone Encounter (Signed)
No los in per 3/14 los - sent message to MD and scheduled per treatment plan,.

## 2017-12-28 ENCOUNTER — Encounter (HOSPITAL_COMMUNITY): Payer: Medicare Other

## 2017-12-28 ENCOUNTER — Encounter (HOSPITAL_COMMUNITY): Payer: Medicare Other | Admitting: Specialist

## 2017-12-30 ENCOUNTER — Ambulatory Visit (HOSPITAL_COMMUNITY): Payer: Medicare Other

## 2018-01-04 ENCOUNTER — Other Ambulatory Visit: Payer: Self-pay | Admitting: Internal Medicine

## 2018-01-05 ENCOUNTER — Inpatient Hospital Stay: Payer: Medicare Other

## 2018-01-05 ENCOUNTER — Inpatient Hospital Stay (HOSPITAL_BASED_OUTPATIENT_CLINIC_OR_DEPARTMENT_OTHER): Payer: Medicare Other | Admitting: Internal Medicine

## 2018-01-05 ENCOUNTER — Encounter: Payer: Self-pay | Admitting: Internal Medicine

## 2018-01-05 VITALS — BP 140/71 | HR 69 | Temp 97.7°F | Resp 20 | Ht 65.0 in

## 2018-01-05 DIAGNOSIS — C711 Malignant neoplasm of frontal lobe: Secondary | ICD-10-CM

## 2018-01-05 DIAGNOSIS — R8271 Bacteriuria: Secondary | ICD-10-CM | POA: Diagnosis not present

## 2018-01-05 DIAGNOSIS — Z87891 Personal history of nicotine dependence: Secondary | ICD-10-CM | POA: Diagnosis not present

## 2018-01-05 DIAGNOSIS — Z5112 Encounter for antineoplastic immunotherapy: Secondary | ICD-10-CM | POA: Diagnosis not present

## 2018-01-05 LAB — URINALYSIS, COMPLETE (UACMP) WITH MICROSCOPIC
Bilirubin Urine: NEGATIVE
GLUCOSE, UA: NEGATIVE mg/dL
Ketones, ur: 5 mg/dL — AB
NITRITE: POSITIVE — AB
PH: 5 (ref 5.0–8.0)
Protein, ur: NEGATIVE mg/dL
Specific Gravity, Urine: 1.026 (ref 1.005–1.030)

## 2018-01-05 LAB — TOTAL PROTEIN, URINE DIPSTICK: PROTEIN: NEGATIVE mg/dL

## 2018-01-05 MED ORDER — CIPROFLOXACIN HCL 500 MG PO TABS: 500.0000 mg | ORAL_TABLET | Freq: Every day | ORAL | 0 refills | Status: AC

## 2018-01-05 MED ORDER — SODIUM CHLORIDE 0.9 % IV SOLN
Freq: Once | INTRAVENOUS | Status: AC
  Administered 2018-01-05: 14:00:00 via INTRAVENOUS

## 2018-01-05 MED ORDER — SODIUM CHLORIDE 0.9 % IV SOLN
5.0000 mg/kg | Freq: Once | INTRAVENOUS | Status: AC
  Administered 2018-01-05: 425 mg via INTRAVENOUS
  Filled 2018-01-05: qty 16

## 2018-01-05 NOTE — Progress Notes (Signed)
[place] at [place], [place] [address] [phone]   Interval Evaluation  Date of Service: 01/05/18 Patient Name: Donna Valentine Patient MRN: [id number] Patient DOB: 1941-04-13 Provider: Ventura Sellers, MD  Identifying Statement:  Donna Valentine is a 77 y.o. female with right frontal glioblastoma   Interval History:  Donna Valentine presents today for fourth Avastin infusion.  She continues to use a walker for ambulation, no changes in motor deficits.  She denies new or progressive neurologic deficits.  Continues to take decadron 2mg  daily.  She is being actively managed by Dr. Simmie Davies at [place]. Treatment/labs/imaging will be performed locally here at [facility].    Medications: Current Outpatient Medications on File Prior to Visit  Medication Sig Dispense Refill  . apixaban (ELIQUIS) 5 MG TABS tablet Take 2 tablets 10 mg by mouth twice a day for  10 days then take 1 tablet  mg by mouth twice a day    . dexamethasone (DECADRON) 1 MG tablet Take 2 mg by mouth 2 (two) times daily with a meal.     . levETIRAcetam (KEPPRA) 500 MG tablet Take 1 tablet (500 mg total) by mouth 2 (two) times daily. 60 tablet 5  . nystatin (NYSTATIN) powder Apply 100,000 g topically daily as needed.    . ondansetron (ZOFRAN) 8 MG tablet Take 1 tablet (8 mg total) by mouth every 8 (eight) hours as needed for nausea. (Patient not taking: Reported on 12/24/2017) 30 tablet 3  . potassium chloride (K-DUR,KLOR-CON) 10 MEQ tablet Take 1 tablet by mouth See admin instructions. Twice a week (Wed/Sat)    . primidone (MYSOLINE) 50 MG tablet Take 50 mg by mouth at bedtime.    . senna (SENOKOT) 8.6 MG TABS tablet Take 1 tablet by mouth 2 (two) times daily.     No current facility-administered medications on file prior to visit.     Allergies:  Allergies  Allergen Reactions  . Penicillins Other (See Comments)    Hallucination Has patient  had a PCN reaction causing immediate rash, facial/tongue/throat swelling, SOB or lightheadedness with hypotension: No Has patient had a PCN reaction causing severe rash involving mucus membranes or skin necrosis: No Has patient had a PCN reaction that required hospitalization No Has patient had a PCN reaction occurring within the last 10 years: No If all of the above answers are "NO", then may proceed with Cephalosporin use.   . Food     Pineapple-Nausea/vomiting   Past Medical History:  Past Medical History:  Diagnosis Date  . GBM (glioblastoma multiforme) ([place])    Past Surgical History:  Past Surgical History:  Procedure Laterality Date  . APPENDECTOMY  1994  . APPLICATION OF CRANIAL NAVIGATION N/A 11/28/2016   Procedure: APPLICATION OF CRANIAL NAVIGATION;  Surgeon: Ashok Pall, MD;  Location: [place];  Service: Neurosurgery;  Laterality: N/A;  . CESAREAN SECTION     x3  . CRANIOTOMY Right 11/28/2016   Procedure: STERIOTACTIC PARIETAL CRANIOTOMY FOR RIGHT FRONTAL TUMOR RESECTION WITH BRAINLAB;  Surgeon: Ashok Pall, MD;  Location: [place];  Service: Neurosurgery;  Laterality: Right;   Social History:  Social History   Socioeconomic History  . Marital status: Married    Spouse name: Not on file  . Number of children: Not on file  . Years of education: Not on file  . Highest education level: Not on file  Occupational History  . Not on file  Social Needs  .  Financial resource strain: Not on file  . Food insecurity:    Worry: Not on file    Inability: Not on file  . Transportation needs:    Medical: Not on file    Non-medical: Not on file  Tobacco Use  . Smoking status: Former Smoker    Last attempt to quit: 11/26/1991    Years since quitting: 26.1  . Smokeless tobacco: Never Used  Substance and Sexual Activity  . Alcohol use: No  . Drug use: No  . Sexual activity: Not Currently  Lifestyle  . Physical activity:    Days per week: Not on file    Minutes per session: Not  on file  . Stress: Not on file  Relationships  . Social connections:    Talks on phone: Not on file    Gets together: Not on file    Attends religious service: Not on file    Active member of club or organization: Not on file    Attends meetings of clubs or organizations: Not on file    Relationship status: Not on file  . Intimate partner violence:    Fear of current or ex partner: Not on file    Emotionally abused: Not on file    Physically abused: Not on file    Forced sexual activity: Not on file  Other Topics Concern  . Not on file  Social History Narrative  . Not on file   Family History:  Family History  Problem Relation Age of Onset  . Heart disease Mother   . Alzheimer's disease Brother   . Cancer Neg Hx     Review of Systems: Constitutional: Denies fevers, chills or abnormal weight loss Eyes: Denies blurriness of vision Ears, nose, mouth, throat, and face: Denies mucositis or sore throat Respiratory: Denies cough, dyspnea or wheezes Cardiovascular: Denies palpitation, chest discomfort or lower extremity swelling Gastrointestinal:  Denies nausea, constipation, diarrhea GU: Pressure with urination Skin: Denies abnormal skin rashes Neurological: Per HPI Musculoskeletal: Denies joint pain, back or neck discomfort. No decrease in ROM Behavioral/Psych: Denies anxiety, disturbance in thought content, and mood instability  Physical Exam: Vitals:   01/05/18 1122  BP: 140/71  Pulse: 69  Resp: 20  Temp: 97.7 F (36.5 C)  SpO2: 98%   KPS: 60. General: Alert, cooperative, pleasant, in no acute distress Head: Craniotomy scar noted, dry and intact. EENT: No conjunctival injection or scleral icterus. Oral mucosa moist Lungs: Resp effort normal Cardiac: Regular rate and rhythm Abdomen: Soft, non-distended abdomen Skin: No rashes cyanosis or petechiae. Extremities: No clubbing or edema  Neurologic Exam: Mental Status: Awake, alert, attentive to examiner. Oriented  to self and environment. Language is fluent with intact comprehension.  Cranial Nerves: Visual acuity is grossly normal. Visual fields are full. Extra-ocular movements intact. No ptosis. Face is symmetric, tongue midline. Motor: Tone and bulk are normal. 3/5 in left arm, 4/5 in left leg. Noted coarse asterixis and easily inducible clonus in arm L>R mimicking tremor. Reflexes are symmetric, no pathologic reflexes present. 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  Sensory: Intact to light touch and temperature Gait: Non-ambulatory   Labs: I have reviewed the data as listed    Component Value Date/Time   NA 141 12/08/2017 1221   NA 143 06/16/2017 0917   K 3.8 12/08/2017 1221   K 3.5 06/16/2017 0917   CL 106 12/08/2017 1221   CO2 25 12/08/2017 1221   CO2 29 06/16/2017 0917   GLUCOSE 149 (H) 12/08/2017 1221  GLUCOSE 98 06/16/2017 0917   BUN 22 12/08/2017 1221   BUN 16.1 06/16/2017 0917   CREATININE 0.73 12/08/2017 1221   CREATININE 0.9 06/16/2017 0917   CALCIUM 9.0 12/08/2017 1221   CALCIUM 9.3 06/16/2017 0917   PROT 6.8 12/08/2017 1221   PROT 6.8 06/16/2017 0917   ALBUMIN 3.2 (L) 12/08/2017 1221   ALBUMIN 3.0 (L) 06/16/2017 0917   AST 14 12/08/2017 1221   AST 15 06/16/2017 0917   ALT 17 12/08/2017 1221   ALT 13 06/16/2017 0917   ALKPHOS 82 12/08/2017 1221   ALKPHOS 74 06/16/2017 0917   BILITOT 0.4 12/08/2017 1221   BILITOT 0.44 06/16/2017 0917   GFRNONAA >60 12/08/2017 1221   GFRAA >60 12/08/2017 1221   Lab Results  Component Value Date   WBC 10.1 12/08/2017   NEUTROABS 5.0 08/28/2017   HGB 14.8 08/28/2017   HCT 46.0 12/08/2017   MCV 94.0 12/08/2017   PLT 177 12/08/2017      Assessment/Plan Glioblastoma multiforme of frontal lobe (HCC) - Plan: Urinalysis, Complete w Microscopic   We appreciate the opportunity to participate in the care of Donna Valentine.  She is clinically stable and cleared to undergo Avastin infusion today, 5mg /kg.    MRI brain is scheduled for  01/07/18 prior to next visit at [facility] on 01/14/18.  Unless treatment plan change is initiated by Dr. Alvina Chou team, she will return in 2 weeks for next infusion.   Urinalysis today revealed bacteruria and likely UTI, we have ordered ciprofloxacin 500mg  daily x3 days to her pharmacy.  All questions were answered. The patient knows to call the clinic with any problems, questions or concerns. No barriers to learning were detected.  The total time spent in the encounter was 25 minutes and more than 50% was on counseling and review of test results   Ventura Sellers, MD Medical Director of Neuro-Oncology [facility] at [place] 01/05/18 10:30 AM

## 2018-01-05 NOTE — Patient Instructions (Signed)
[facility] Discharge Instructions for Patients Receiving Chemotherapy  Today you received the following chemotherapy agents Avastin  To help prevent nausea and vomiting after your treatment, we encourage you to take your nausea medication as directed   If you develop nausea and vomiting that is not controlled by your nausea medication, call the clinic.   BELOW ARE SYMPTOMS THAT SHOULD BE REPORTED IMMEDIATELY:  *FEVER GREATER THAN 100.5 F  *CHILLS WITH OR WITHOUT FEVER  NAUSEA AND VOMITING THAT IS NOT CONTROLLED WITH YOUR NAUSEA MEDICATION  *UNUSUAL SHORTNESS OF BREATH  *UNUSUAL BRUISING OR BLEEDING  TENDERNESS IN MOUTH AND THROAT WITH OR WITHOUT PRESENCE OF ULCERS  *URINARY PROBLEMS  *BOWEL PROBLEMS  UNUSUAL RASH Items with * indicate a potential emergency and should be followed up as soon as possible.  Feel free to call the clinic should you have any questions or concerns. The clinic phone number is [phone].  Please show the CHEMO ALERT CARD at check-in to the Emergency Department and triage nurse.   

## 2018-01-06 ENCOUNTER — Telehealth: Payer: Self-pay

## 2018-01-06 ENCOUNTER — Ambulatory Visit: Payer: Medicare Other

## 2018-01-06 ENCOUNTER — Other Ambulatory Visit: Payer: Medicare Other

## 2018-01-06 ENCOUNTER — Ambulatory Visit: Payer: Medicare Other | Admitting: Internal Medicine

## 2018-01-06 NOTE — Telephone Encounter (Signed)
Per 3/26 no los 

## 2018-01-07 ENCOUNTER — Other Ambulatory Visit: Payer: Self-pay | Admitting: *Deleted

## 2018-01-07 ENCOUNTER — Ambulatory Visit (HOSPITAL_COMMUNITY)
Admission: RE | Admit: 2018-01-07 | Discharge: 2018-01-07 | Disposition: A | Discharge status: Home / Self Care | Payer: Medicare Other | Source: Ambulatory Visit | Attending: Internal Medicine | Admitting: Internal Medicine

## 2018-01-07 DIAGNOSIS — C711 Malignant neoplasm of frontal lobe: Secondary | ICD-10-CM | POA: Insufficient documentation

## 2018-01-07 DIAGNOSIS — R609 Edema, unspecified: Secondary | ICD-10-CM | POA: Insufficient documentation

## 2018-01-07 MED ORDER — CIPROFLOXACIN HCL 500 MG PO TABS: 500.0000 mg | ORAL_TABLET | Freq: Two times a day (BID) | ORAL | 0 refills | Status: DC

## 2018-01-07 MED ORDER — GADOBENATE DIMEGLUMINE 529 MG/ML IV SOLN
20.0000 mL | Freq: Once | INTRAVENOUS | Status: AC | PRN
  Administered 2018-01-07: 18 mL via INTRAVENOUS

## 2018-01-07 NOTE — Progress Notes (Signed)
Daughter Barnett Applebaum called to question the duration of Cipro for the UTI.  She states that her mom usually has urinary tract infection and in the past the CIPRO was ordered for longer.  Per Dr Mickeal Skinner update order for Cipro 500 BID for total of 5 days

## 2018-01-19 ENCOUNTER — Other Ambulatory Visit: Payer: Self-pay | Admitting: *Deleted

## 2018-01-21 ENCOUNTER — Inpatient Hospital Stay: Payer: Medicare Other

## 2018-01-21 ENCOUNTER — Telehealth: Payer: Self-pay | Admitting: *Deleted

## 2018-01-21 ENCOUNTER — Inpatient Hospital Stay: Payer: Medicare Other | Admitting: Internal Medicine

## 2018-01-21 ENCOUNTER — Inpatient Hospital Stay: Payer: Medicare Other | Attending: Internal Medicine

## 2018-01-21 ENCOUNTER — Telehealth: Payer: Self-pay | Admitting: Internal Medicine

## 2018-01-21 ENCOUNTER — Encounter: Payer: Self-pay | Admitting: Internal Medicine

## 2018-01-21 ENCOUNTER — Other Ambulatory Visit: Payer: Self-pay | Admitting: *Deleted

## 2018-01-21 VITALS — BP 147/75

## 2018-01-21 VITALS — BP 142/71 | HR 74 | Temp 97.7°F | Resp 18 | Ht 65.0 in

## 2018-01-21 DIAGNOSIS — Z79899 Other long term (current) drug therapy: Secondary | ICD-10-CM | POA: Insufficient documentation

## 2018-01-21 DIAGNOSIS — C711 Malignant neoplasm of frontal lobe: Secondary | ICD-10-CM

## 2018-01-21 DIAGNOSIS — M6281 Muscle weakness (generalized): Secondary | ICD-10-CM | POA: Diagnosis not present

## 2018-01-21 DIAGNOSIS — Z87891 Personal history of nicotine dependence: Secondary | ICD-10-CM

## 2018-01-21 DIAGNOSIS — R3915 Urgency of urination: Secondary | ICD-10-CM | POA: Diagnosis not present

## 2018-01-21 DIAGNOSIS — R358 Other polyuria: Secondary | ICD-10-CM

## 2018-01-21 DIAGNOSIS — R350 Frequency of micturition: Secondary | ICD-10-CM | POA: Diagnosis not present

## 2018-01-21 DIAGNOSIS — Z5112 Encounter for antineoplastic immunotherapy: Secondary | ICD-10-CM | POA: Diagnosis present

## 2018-01-21 DIAGNOSIS — R531 Weakness: Secondary | ICD-10-CM

## 2018-01-21 DIAGNOSIS — R3589 Other polyuria: Secondary | ICD-10-CM

## 2018-01-21 LAB — URINALYSIS, COMPLETE (UACMP) WITH MICROSCOPIC
Bilirubin Urine: NEGATIVE
GLUCOSE, UA: NEGATIVE mg/dL
Ketones, ur: NEGATIVE mg/dL
Leukocytes, UA: NEGATIVE
Nitrite: POSITIVE — AB
PH: 5 (ref 5.0–8.0)
PROTEIN: NEGATIVE mg/dL
Specific Gravity, Urine: 1.013 (ref 1.005–1.030)

## 2018-01-21 LAB — TOTAL PROTEIN, URINE DIPSTICK: PROTEIN: NEGATIVE mg/dL

## 2018-01-21 MED ORDER — SODIUM CHLORIDE 0.9 % IV SOLN
5.0000 mg/kg | Freq: Once | INTRAVENOUS | Status: AC
  Administered 2018-01-21: 425 mg via INTRAVENOUS
  Filled 2018-01-21: qty 1

## 2018-01-21 MED ORDER — SODIUM CHLORIDE 0.9 % IV SOLN
Freq: Once | INTRAVENOUS | Status: AC
  Administered 2018-01-21: 14:00:00 via INTRAVENOUS

## 2018-01-21 MED ORDER — CIPROFLOXACIN HCL 500 MG PO TABS: 500.0000 mg | ORAL_TABLET | Freq: Two times a day (BID) | ORAL | 0 refills | Status: DC

## 2018-01-21 NOTE — Telephone Encounter (Signed)
Scheduled per 4/11 sch message - patient is aware of appt date and time.

## 2018-01-21 NOTE — Progress Notes (Signed)
[place] [facility] [place], [place] [address] [phone]   Interval Evaluation  Date of Service: 01/21/18 Patient Name: Donna Valentine Patient MRN: [id number] Patient DOB: 05-02-1941 Provider: Ventura Sellers, MD  Identifying Statement:  Donna Valentine is a 77 y.o. female with right frontal glioblastoma   Interval History:  Donna Valentine presents today for Avastin infusion.  Recent MRI showed good response, recently she was evaluated for interval follow up at [facility]. She continues to use a walker for ambulation, no changes in motor deficits.  She denies new or progressive neurologic deficits.  Continues to take decadron 2mg  daily.  She is being actively managed by Dr. Simmie [facility] Treatment/labs/imaging will be performed locally here at [facility]    Medications: Current Outpatient Medications on File Prior to Visit  Medication Sig Dispense Refill  . apixaban (ELIQUIS) 5 MG TABS tablet Take 2 tablets 10 mg by mouth twice a day for  10 days then take 1 tablet  mg by mouth twice a day    . dexamethasone (DECADRON) 1 MG tablet Take 2 mg by mouth 2 (two) times daily with a meal.     . levETIRAcetam (KEPPRA) 500 MG tablet Take 1 tablet (500 mg total) by mouth 2 (two) times daily. 60 tablet 5  . nystatin (MYCOSTATIN) 100000 UNIT/ML suspension Take 5 mLs by mouth 3 (three) times daily.    Marland Kitchen nystatin (NYSTATIN) powder Apply 100,000 g topically daily as needed.    . potassium chloride (K-DUR,KLOR-CON) 10 MEQ tablet Take 1 tablet by mouth See admin instructions. Twice a week (Wed/Sat)    . primidone (MYSOLINE) 50 MG tablet Take 50 mg by mouth at bedtime.    . senna (SENOKOT) 8.6 MG TABS tablet Take 1 tablet by mouth 2 (two) times daily.    . ciprofloxacin (CIPRO) 500 MG tablet Take 1 tablet (500 mg total) by mouth 2 (two) times daily. (Patient not taking: Reported on 01/21/2018) 7 tablet 0  . ondansetron (ZOFRAN) 8  MG tablet Take 1 tablet (8 mg total) by mouth every 8 (eight) hours as needed for nausea. (Patient not taking: Reported on 12/24/2017) 30 tablet 3   No current facility-administered medications on file prior to visit.     Allergies:  Allergies  Allergen Reactions  . Penicillins Other (See Comments)    Hallucination Has patient had a PCN reaction causing immediate rash, facial/tongue/throat swelling, SOB or lightheadedness with hypotension: No Has patient had a PCN reaction causing severe rash involving mucus membranes or skin necrosis: No Has patient had a PCN reaction that required hospitalization No Has patient had a PCN reaction occurring within the last 10 years: No If all of the above answers are "NO", then may proceed with Cephalosporin use.   . Food     Pineapple-Nausea/vomiting   Past Medical History:  Past Medical History:  Diagnosis Date  . GBM (glioblastoma multiforme) ([place])    Past Surgical History:  Past Surgical History:  Procedure Laterality Date  . APPENDECTOMY  1994  . APPLICATION OF CRANIAL NAVIGATION N/A 11/28/2016   Procedure: APPLICATION OF CRANIAL NAVIGATION;  Surgeon: Ashok Pall, MD;  Location: [facility];  Service: Neurosurgery;  Laterality: N/A;  . CESAREAN SECTION     x3  . CRANIOTOMY Right 11/28/2016   Procedure: STERIOTACTIC PARIETAL CRANIOTOMY FOR RIGHT FRONTAL TUMOR RESECTION WITH BRAINLAB;  Surgeon: Ashok Pall, MD;  Location: [facility];  Service: Neurosurgery;  Laterality: Right;  Social History:  Social History   Socioeconomic History  . Marital status: Married    Spouse name: Not on file  . Number of children: Not on file  . Years of education: Not on file  . Highest education level: Not on file  Occupational History  . Not on file  Social Needs  . Financial resource strain: Not on file  . Food insecurity:    Worry: Not on file    Inability: Not on file  . Transportation needs:    Medical: Not on file    Non-medical: Not on file  Tobacco  Use  . Smoking status: Former Smoker    Last attempt to quit: 11/26/1991    Years since quitting: 26.1  . Smokeless tobacco: Never Used  Substance and Sexual Activity  . Alcohol use: No  . Drug use: No  . Sexual activity: Not Currently  Lifestyle  . Physical activity:    Days per week: Not on file    Minutes per session: Not on file  . Stress: Not on file  Relationships  . Social connections:    Talks on phone: Not on file    Gets together: Not on file    Attends religious service: Not on file    Active member of club or organization: Not on file    Attends meetings of clubs or organizations: Not on file    Relationship status: Not on file  . Intimate partner violence:    Fear of current or ex partner: Not on file    Emotionally abused: Not on file    Physically abused: Not on file    Forced sexual activity: Not on file  Other Topics Concern  . Not on file  Social History Narrative  . Not on file   Family History:  Family History  Problem Relation Age of Onset  . Heart disease Mother   . Alzheimer's disease Brother   . Cancer Neg Hx     Review of Systems: Constitutional: Denies fevers, chills or abnormal weight loss Eyes: Denies blurriness of vision Ears, nose, mouth, throat, and face: Denies mucositis or sore throat Respiratory: Denies cough, dyspnea or wheezes Cardiovascular: Denies palpitation, chest discomfort or lower extremity swelling Gastrointestinal:  Denies nausea, constipation, diarrhea GU: Pressure with urination, increased frequency Skin: Denies abnormal skin rashes Neurological: Per HPI Musculoskeletal: Denies joint pain, back or neck discomfort. No decrease in ROM Behavioral/Psych: Denies anxiety, disturbance in thought content, and mood instability  Physical Exam: Vitals:   01/21/18 1150  BP: (!) 142/71  Pulse: 74  Resp: 18  Temp: 97.7 F (36.5 C)  SpO2: 100%   KPS: 60. General: Alert, cooperative, pleasant, in no acute distress Head:  Craniotomy scar noted, dry and intact. EENT: No conjunctival injection or scleral icterus. Oral mucosa moist Lungs: Resp effort normal Cardiac: Regular rate and rhythm Abdomen: Soft, non-distended abdomen Skin: No rashes cyanosis or petechiae. Extremities: No clubbing or edema  Neurologic Exam: Mental Status: Awake, alert, attentive to examiner. Oriented to self and environment. Language is fluent with intact comprehension.  Cranial Nerves: Visual acuity is grossly normal. Visual fields are full. Extra-ocular movements intact. No ptosis. Face is symmetric, tongue midline. Motor: Tone and bulk are normal. 3/5 in left arm, 4/5 in left leg. Noted coarse asterixis and easily inducible clonus in arm L>R mimicking tremor. Reflexes are symmetric, no pathologic reflexes present. 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  Sensory: Intact to light touch and temperature Gait: Non-ambulatory   Labs: I  have reviewed the data as listed    Component Value Date/Time   NA 141 12/08/2017 1221   NA 143 06/16/2017 0917   K 3.8 12/08/2017 1221   K 3.5 06/16/2017 0917   CL 106 12/08/2017 1221   CO2 25 12/08/2017 1221   CO2 29 06/16/2017 0917   GLUCOSE 149 (H) 12/08/2017 1221   GLUCOSE 98 06/16/2017 0917   BUN 22 12/08/2017 1221   BUN 16.1 06/16/2017 0917   CREATININE 0.73 12/08/2017 1221   CREATININE 0.9 06/16/2017 0917   CALCIUM 9.0 12/08/2017 1221   CALCIUM 9.3 06/16/2017 0917   PROT 6.8 12/08/2017 1221   PROT 6.8 06/16/2017 0917   ALBUMIN 3.2 (L) 12/08/2017 1221   ALBUMIN 3.0 (L) 06/16/2017 0917   AST 14 12/08/2017 1221   AST 15 06/16/2017 0917   ALT 17 12/08/2017 1221   ALT 13 06/16/2017 0917   ALKPHOS 82 12/08/2017 1221   ALKPHOS 74 06/16/2017 0917   BILITOT 0.4 12/08/2017 1221   BILITOT 0.44 06/16/2017 0917   GFRNONAA >60 12/08/2017 1221   GFRAA >60 12/08/2017 1221   Lab Results  Component Value Date   WBC 10.1 12/08/2017   NEUTROABS 5.0 08/28/2017   HGB 14.8 08/28/2017   HCT 46.0  12/08/2017   MCV 94.0 12/08/2017   PLT 177 12/08/2017      Assessment/Plan  1. Glioblastoma multiforme of frontal lobe ([place])  2. Left-sided weakness  We appreciate the opportunity to participate in the care of [facility].  She is clinically stable and cleared to undergo Avastin infusion today, 5mg /kg.    Decadron should be decreased to 2mg /1mg  for one week, then 1mg /1mg  for one week.  She will return in 2 weeks for next infusion with full set of labs, including Vitamin B12, TSH, cortisol, Vit D.   Will perform urinalysis and culture again today given recurrence of urinary urgency/frequency after previous improvement s/p 5 days ciprofloxacin 2 weeks prior.    Referred again for physical therapy given ongoing motor deficits.  All questions were answered. The patient knows to call the clinic with any problems, questions or concerns. No barriers to learning were detected.  The total time spent in the encounter was 25 minutes and more than 50% was on counseling and review of test results   Ventura Sellers, MD Medical Director of Neuro-Oncology [facility] at [place] 01/21/18 3:16 PM

## 2018-01-21 NOTE — Telephone Encounter (Signed)
Advised Daughter urine results showed urinary tract infection.  Cipro called into local pharmacy.  Per Dr Mickeal Skinner if patient is unable to resolve UTI with this antibiotic then she will need to be seen by her PCP or Infectious Disease MD for management of chronic UTI.

## 2018-01-21 NOTE — Patient Instructions (Signed)
[facility] Discharge Instructions for Patients Receiving Chemotherapy  Today you received the following chemotherapy agents Avastin  To help prevent nausea and vomiting after your treatment, we encourage you to take your nausea medication as directed   If you develop nausea and vomiting that is not controlled by your nausea medication, call the clinic.   BELOW ARE SYMPTOMS THAT SHOULD BE REPORTED IMMEDIATELY:  *FEVER GREATER THAN 100.5 F  *CHILLS WITH OR WITHOUT FEVER  NAUSEA AND VOMITING THAT IS NOT CONTROLLED WITH YOUR NAUSEA MEDICATION  *UNUSUAL SHORTNESS OF BREATH  *UNUSUAL BRUISING OR BLEEDING  TENDERNESS IN MOUTH AND THROAT WITH OR WITHOUT PRESENCE OF ULCERS  *URINARY PROBLEMS  *BOWEL PROBLEMS  UNUSUAL RASH Items with * indicate a potential emergency and should be followed up as soon as possible.  Feel free to call the clinic should you have any questions or concerns. The clinic phone number is [phone].  Please show the CHEMO ALERT CARD at check-in to the Emergency Department and triage nurse.   

## 2018-01-23 LAB — URINE CULTURE: Culture: >=100000 — AB

## 2018-01-25 ENCOUNTER — Telehealth: Payer: Self-pay | Admitting: *Deleted

## 2018-01-25 NOTE — Telephone Encounter (Signed)
Voicemail received requesting Urine Culture results.  Message forwarded to [facility] extension [phone] for further patient communication from collaborative nurse with patient's daughter, George Hugh [phone].

## 2018-01-26 ENCOUNTER — Telehealth: Payer: Self-pay

## 2018-01-26 ENCOUNTER — Encounter: Payer: Self-pay | Admitting: *Deleted

## 2018-01-26 NOTE — Telephone Encounter (Signed)
01/25/18: Returned call to patient's daughter Jan in reference to urinalysis results. Daughter wanted to know results of culture in reference to Cipro. Informed patient's daughter that culture organism was sensitive to Cipro. Patient's  daughter stated ok and that she would like urine culture repeated at 4/25 lab visit.

## 2018-01-26 NOTE — Progress Notes (Signed)
Kat from [facility] called to get verbal authorization to treat the patient with Occupational therapy for 5 visits. Lebron Conners, MD gave verbal authorization for occupational therapy to treat for 5 visits.

## 2018-01-27 ENCOUNTER — Telehealth: Payer: Self-pay | Admitting: *Deleted

## 2018-01-27 NOTE — Telephone Encounter (Signed)
"  [facility] Physical Therapist calling in reference to Dr. Renda Rolls patient."

## 2018-02-01 ENCOUNTER — Other Ambulatory Visit: Payer: Self-pay | Admitting: *Deleted

## 2018-02-01 DIAGNOSIS — C711 Malignant neoplasm of frontal lobe: Secondary | ICD-10-CM

## 2018-02-02 ENCOUNTER — Telehealth: Payer: Self-pay | Admitting: *Deleted

## 2018-02-02 NOTE — Telephone Encounter (Signed)
02/02/2018 Patients daughter called to question the urine culture results.  Explained that Cipro was sensitive to the bacteria therefore appropriate.  Daughter questioned if we could have her urine tested again when she comes back in to see if the UTI is resolved, approved by Dr Mickeal Skinner.  Family is aware that if UTI is persistent then she will need to follow up with PCP or Urologist for further treatment.    Daughter also expressed concern about worsening ambulation, listing to left side and weakness since last week.  She also decreased dosage on her Decadron taper and the side effects began.  Per Dr Mickeal Skinner ok to increase back up to Decadron 2 mg in the morning and 1 mg in the evening.  Hopefully improvement will be seen prior to her next appointment.

## 2018-02-04 ENCOUNTER — Inpatient Hospital Stay: Payer: Medicare Other

## 2018-02-04 ENCOUNTER — Telehealth: Payer: Self-pay

## 2018-02-04 ENCOUNTER — Encounter: Payer: Self-pay | Admitting: Internal Medicine

## 2018-02-04 ENCOUNTER — Inpatient Hospital Stay: Payer: Medicare Other | Admitting: Internal Medicine

## 2018-02-04 VITALS — BP 154/71 | HR 77 | Temp 97.7°F | Resp 18 | Ht 65.0 in

## 2018-02-04 DIAGNOSIS — R8271 Bacteriuria: Secondary | ICD-10-CM | POA: Diagnosis not present

## 2018-02-04 DIAGNOSIS — C711 Malignant neoplasm of frontal lobe: Secondary | ICD-10-CM | POA: Diagnosis not present

## 2018-02-04 DIAGNOSIS — N390 Urinary tract infection, site not specified: Secondary | ICD-10-CM

## 2018-02-04 DIAGNOSIS — M6281 Muscle weakness (generalized): Secondary | ICD-10-CM | POA: Diagnosis not present

## 2018-02-04 DIAGNOSIS — R531 Weakness: Secondary | ICD-10-CM

## 2018-02-04 DIAGNOSIS — Z5112 Encounter for antineoplastic immunotherapy: Secondary | ICD-10-CM | POA: Diagnosis not present

## 2018-02-04 LAB — URINALYSIS, COMPLETE (UACMP) WITH MICROSCOPIC
BILIRUBIN URINE: NEGATIVE
GLUCOSE, UA: NEGATIVE mg/dL
KETONES UR: NEGATIVE mg/dL
Leukocytes, UA: NEGATIVE
Nitrite: NEGATIVE
PH: 6 (ref 5.0–8.0)
PROTEIN: NEGATIVE mg/dL
Specific Gravity, Urine: 1.013 (ref 1.005–1.030)

## 2018-02-04 LAB — CMP (CANCER CENTER ONLY)
ALBUMIN: 3.1 g/dL — AB (ref 3.5–5.0)
ALK PHOS: 60 U/L (ref 40–150)
ALT: 23 U/L (ref 0–55)
ANION GAP: 10 (ref 3–11)
AST: 15 U/L (ref 5–34)
BUN: 21 mg/dL (ref 7–26)
CALCIUM: 8.8 mg/dL (ref 8.4–10.4)
CO2: 23 mmol/L (ref 22–29)
Chloride: 109 mmol/L (ref 98–109)
Creatinine: 0.65 mg/dL (ref 0.60–1.10)
GFR, Est AFR Am: >60 mL/min (ref >60)
GLUCOSE: 157 mg/dL — AB (ref 70–140)
Potassium: 3.9 mmol/L (ref 3.5–5.1)
Sodium: 142 mmol/L (ref 136–145)
Total Bilirubin: 0.4 mg/dL (ref 0.2–1.2)
Total Protein: 6.3 g/dL — ABNORMAL LOW (ref 6.4–8.3)

## 2018-02-04 LAB — CBC (CANCER CENTER ONLY)
HCT: 46.2 % (ref 34.8–46.6)
HEMOGLOBIN: 15.1 g/dL (ref 11.6–15.9)
MCH: 30.2 pg (ref 25.1–34.0)
MCHC: 32.7 g/dL (ref 31.5–36.0)
MCV: 92.4 fL (ref 79.5–101.0)
Platelet Count: 103 10*3/uL — ABNORMAL LOW (ref 145–400)
RBC: 5 MIL/uL (ref 3.70–5.45)
RDW: 14.3 % (ref 11.2–14.5)
WBC: 6.8 10*3/uL (ref 3.9–10.3)

## 2018-02-04 LAB — TSH: TSH: 3.134 u[IU]/mL (ref 0.308–3.960)

## 2018-02-04 MED ORDER — SODIUM CHLORIDE 0.9 % IV SOLN
5.0000 mg/kg | Freq: Once | INTRAVENOUS | Status: AC
  Administered 2018-02-04: 425 mg via INTRAVENOUS
  Filled 2018-02-04: qty 16

## 2018-02-04 MED ORDER — CIPROFLOXACIN HCL 500 MG PO TABS: 500.0000 mg | ORAL_TABLET | Freq: Two times a day (BID) | ORAL | 0 refills | Status: DC

## 2018-02-04 MED ORDER — SODIUM CHLORIDE 0.9 % IV SOLN
Freq: Once | INTRAVENOUS | Status: AC
  Administered 2018-02-04: 13:00:00 via INTRAVENOUS

## 2018-02-04 NOTE — Telephone Encounter (Signed)
Per 4/25 no los 

## 2018-02-04 NOTE — Progress Notes (Signed)
[place] at [place], [place] [address] [phone]   Interval Evaluation  Date of Service: 02/04/18 Patient Name: Donna Valentine Patient MRN: [id number] Patient DOB: December 14, 1940 Provider: Ventura Sellers, MD  Identifying Statement:  Donna Valentine is a 77 y.o. female with right frontal glioblastoma   Interval History:  Donna Valentine presents today for Avastin infusion.  She has experienced clinical decline since this past Saturday, described as "low energy", "worening left sided weakness".  Family describes very positive improvement and functional recovery after most recent Avastin and dosing of Ciprofloxacin for repeat UTI, but decline which started after antibiotics were completed on Thursday.  There is suspicion for recurrent or under-treated infection, as pattern of symptoms fits with prior urinary infections.  None of the complaints or deficits are new, just worsening of baseline function.  Decadron was increased to 3mg  daily from 2mg  daily, but this has not had positive effect.  She is being actively managed by Dr. Simmie Davies at [facility]. Treatment/labs/imaging will be performed locally here at [facility].    Medications: Current Outpatient Medications on File Prior to Visit  Medication Sig Dispense Refill  . apixaban (ELIQUIS) 5 MG TABS tablet Take 2 tablets 10 mg by mouth twice a day for  10 days then take 1 tablet  mg by mouth twice a day    . ciprofloxacin (CIPRO) 500 MG tablet Take 1 tablet (500 mg total) by mouth 2 (two) times daily. (Patient not taking: Reported on 01/21/2018) 7 tablet 0  . ciprofloxacin (CIPRO) 500 MG tablet Take 1 tablet (500 mg total) by mouth 2 (two) times daily. 14 tablet 0  . dexamethasone (DECADRON) 1 MG tablet Take 2 mg by mouth 2 (two) times daily with a meal.     . levETIRAcetam (KEPPRA) 500 MG tablet Take 1 tablet (500 mg total) by mouth 2 (two) times daily. 60 tablet 5  .  nystatin (MYCOSTATIN) 100000 UNIT/ML suspension Take 5 mLs by mouth 3 (three) times daily.    Marland Kitchen nystatin (NYSTATIN) powder Apply 100,000 g topically daily as needed.    . ondansetron (ZOFRAN) 8 MG tablet Take 1 tablet (8 mg total) by mouth every 8 (eight) hours as needed for nausea. (Patient not taking: Reported on 12/24/2017) 30 tablet 3  . potassium chloride (K-DUR,KLOR-CON) 10 MEQ tablet Take 1 tablet by mouth See admin instructions. Twice a week (Wed/Sat)    . primidone (MYSOLINE) 50 MG tablet Take 50 mg by mouth at bedtime.    . senna (SENOKOT) 8.6 MG TABS tablet Take 1 tablet by mouth 2 (two) times daily.     No current facility-administered medications on file prior to visit.     Allergies:  Allergies  Allergen Reactions  . Penicillins Other (See Comments)    Hallucination Has patient had a PCN reaction causing immediate rash, facial/tongue/throat swelling, SOB or lightheadedness with hypotension: No Has patient had a PCN reaction causing severe rash involving mucus membranes or skin necrosis: No Has patient had a PCN reaction that required hospitalization No Has patient had a PCN reaction occurring within the last 10 years: No If all of the above answers are "NO", then may proceed with Cephalosporin use.   . Food     Pineapple-Nausea/vomiting   Past Medical History:  Past Medical History:  Diagnosis Date  . GBM (glioblastoma multiforme) ([place])    Past Surgical History:  Past Surgical History:  Procedure Laterality Date  .  APPENDECTOMY  1994  . APPLICATION OF CRANIAL NAVIGATION N/A 11/28/2016   Procedure: APPLICATION OF CRANIAL NAVIGATION;  Surgeon: Ashok Pall, MD;  Location: [facility];  Service: Neurosurgery;  Laterality: N/A;  . CESAREAN SECTION     x3  . CRANIOTOMY Right 11/28/2016   Procedure: STERIOTACTIC PARIETAL CRANIOTOMY FOR RIGHT FRONTAL TUMOR RESECTION WITH BRAINLAB;  Surgeon: Ashok Pall, MD;  Location: [facility];  Service: Neurosurgery;  Laterality: Right;   Social  History:  Social History   Socioeconomic History  . Marital status: Married    Spouse name: Not on file  . Number of children: Not on file  . Years of education: Not on file  . Highest education level: Not on file  Occupational History  . Not on file  Social Needs  . Financial resource strain: Not on file  . Food insecurity:    Worry: Not on file    Inability: Not on file  . Transportation needs:    Medical: Not on file    Non-medical: Not on file  Tobacco Use  . Smoking status: Former Smoker    Last attempt to quit: 11/26/1991    Years since quitting: 26.2  . Smokeless tobacco: Never Used  Substance and Sexual Activity  . Alcohol use: No  . Drug use: No  . Sexual activity: Not Currently  Lifestyle  . Physical activity:    Days per week: Not on file    Minutes per session: Not on file  . Stress: Not on file  Relationships  . Social connections:    Talks on phone: Not on file    Gets together: Not on file    Attends religious service: Not on file    Active member of club or organization: Not on file    Attends meetings of clubs or organizations: Not on file    Relationship status: Not on file  . Intimate partner violence:    Fear of current or ex partner: Not on file    Emotionally abused: Not on file    Physically abused: Not on file    Forced sexual activity: Not on file  Other Topics Concern  . Not on file  Social History Narrative  . Not on file   Family History:  Family History  Problem Relation Age of Onset  . Heart disease Mother   . Alzheimer's disease Brother   . Cancer Neg Hx     Review of Systems: Constitutional: Fatigue, low energy. Eyes: Denies blurriness of vision Ears, nose, mouth, throat, and face: Denies mucositis or sore throat Respiratory: Denies cough, dyspnea or wheezes Cardiovascular: Denies palpitation, chest discomfort or lower extremity swelling Gastrointestinal:  Denies nausea, constipation, diarrhea GU: Pressure with  urination, increased frequency Skin: Denies abnormal skin rashes Neurological: Per HPI Musculoskeletal: Denies joint pain, back or neck discomfort. No decrease in ROM Behavioral/Psych: Denies anxiety, disturbance in thought content, and mood instability  Physical Exam: Vitals:   02/04/18 1126  BP: (!) 154/71  Pulse: 77  Resp: 18  Temp: 97.7 F (36.5 C)  SpO2: 98%   KPS: 60. General: Alert, cooperative, pleasant, in no acute distress in wheelchair. Head: Craniotomy scar noted, dry and intact. EENT: No conjunctival injection or scleral icterus. Oral mucosa moist Lungs: Resp effort normal Cardiac: Regular rate and rhythm Abdomen: Soft, non-distended abdomen Skin: No rashes cyanosis or petechiae. Extremities: No clubbing or edema  Neurologic Exam: Mental Status: Awake, alert, attentive to examiner. Oriented to self and environment. Language is fluent with  intact comprehension.  Some visual spatial neglect on right.  Cranial Nerves: Visual acuity is grossly normal. Visual fields are full. Extra-ocular movements intact. No ptosis. Face is symmetric, tongue midline. Motor: Tone and bulk are normal. 3/5 in left arm, 3/5 in left leg. Noted coarse asterixis and easily inducible clonus in arm L>R mimicking tremor. Noted proximal weakness in bilateral legs to hip flexion. Reflexes are symmetric, no pathologic reflexes present. 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  Sensory: Intact to light touch and temperature Gait: Non-ambulatory   Labs: I have reviewed the data as listed    Component Value Date/Time   NA 141 12/08/2017 1221   NA 143 06/16/2017 0917   K 3.8 12/08/2017 1221   K 3.5 06/16/2017 0917   CL 106 12/08/2017 1221   CO2 25 12/08/2017 1221   CO2 29 06/16/2017 0917   GLUCOSE 149 (H) 12/08/2017 1221   GLUCOSE 98 06/16/2017 0917   BUN 22 12/08/2017 1221   BUN 16.1 06/16/2017 0917   CREATININE 0.73 12/08/2017 1221   CREATININE 0.9 06/16/2017 0917   CALCIUM 9.0 12/08/2017 1221     CALCIUM 9.3 06/16/2017 0917   PROT 6.8 12/08/2017 1221   PROT 6.8 06/16/2017 0917   ALBUMIN 3.2 (L) 12/08/2017 1221   ALBUMIN 3.0 (L) 06/16/2017 0917   AST 14 12/08/2017 1221   AST 15 06/16/2017 0917   ALT 17 12/08/2017 1221   ALT 13 06/16/2017 0917   ALKPHOS 82 12/08/2017 1221   ALKPHOS 74 06/16/2017 0917   BILITOT 0.4 12/08/2017 1221   BILITOT 0.44 06/16/2017 0917   GFRNONAA >60 12/08/2017 1221   GFRAA >60 12/08/2017 1221   Lab Results  Component Value Date   WBC 6.8 02/04/2018   NEUTROABS 5.0 08/28/2017   HGB 15.1 02/04/2018   HCT 46.2 02/04/2018   MCV 92.4 02/04/2018   PLT 103 (L) 02/04/2018      Assessment/Plan  1. Glioblastoma multiforme of frontal lobe ([place])  2. Left-sided weakness  Ms. Harmon-Dixon has moderate clinical changes today consistent with global (rather than focal) decline which are concerning for recurrent/refractory UTI.  Urinalysis again shows packed bacteria.  Symptoms are similar to prior infections with non-specific CNS decompensation.  We reviewed klebsiella culture and sensitivities from 2 weeks ago.  Patient and family are clear that ciprofloxacin has been clinically efficacious, but that the effect "wears off" once the antibiotic course is complete.  Augmentin would be the next line oral therapy, but there is documentation of penicillin allergy, of which there is not significant detail/history.   At this time we recommend re-starting Ciprofloxacin 500mg  q12 x14 days, and will make a referral to urology for management of this complex problem.  ID consult could also be considered.   We will reach out to [facility] rehabilitation center, where the patient had a UTI treated with oral antibiotic this past fall which was very effective.  If this was in fact Augmentin this would suggest safe use of the drug and lead [place] to change therapy.  Decadron should be decreased down to 2mg  daily.  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  Will  place referral for home nursing as requested.  We discussed the possibility of delaying her Avastin, but patient and family strongly preferred to undergo treatment today.  Urine protein was normal.   We appreciate the opportunity to participate in the care of [facility].    She will return in 2 weeks for next infusion with full set  of labs  All questions were answered. The patient knows to call the clinic with any problems, questions or concerns. No barriers to learning were detected.  The total time spent in the encounter was 40 minutes and more than 50% was on counseling and review of test results   Ventura Sellers, MD Medical Director of Neuro-Oncology [facility] at [place] 02/04/18 11:31 AM

## 2018-02-04 NOTE — Patient Instructions (Signed)
[facility] Discharge Instructions for Patients Receiving Chemotherapy  Today you received the following chemotherapy agents avastin  To help prevent nausea and vomiting after your treatment, we encourage you to take your nausea medication as directed   If you develop nausea and vomiting that is not controlled by your nausea medication, call the clinic.   BELOW ARE SYMPTOMS THAT SHOULD BE REPORTED IMMEDIATELY:  *FEVER GREATER THAN 100.5 F  *CHILLS WITH OR WITHOUT FEVER  NAUSEA AND VOMITING THAT IS NOT CONTROLLED WITH YOUR NAUSEA MEDICATION  *UNUSUAL SHORTNESS OF BREATH  *UNUSUAL BRUISING OR BLEEDING  TENDERNESS IN MOUTH AND THROAT WITH OR WITHOUT PRESENCE OF ULCERS  *URINARY PROBLEMS  *BOWEL PROBLEMS  UNUSUAL RASH Items with * indicate a potential emergency and should be followed up as soon as possible.  Feel free to call the clinic you have any questions or concerns. The clinic phone number is [phone].  

## 2018-02-08 ENCOUNTER — Emergency Department (HOSPITAL_COMMUNITY): Payer: Medicare Other

## 2018-02-08 ENCOUNTER — Encounter (HOSPITAL_COMMUNITY): Payer: Self-pay

## 2018-02-08 ENCOUNTER — Other Ambulatory Visit: Payer: Self-pay

## 2018-02-08 ENCOUNTER — Other Ambulatory Visit: Payer: Self-pay | Admitting: *Deleted

## 2018-02-08 ENCOUNTER — Inpatient Hospital Stay (HOSPITAL_COMMUNITY)
Admission: EM | Admit: 2018-02-08 | Discharge: 2018-02-12 | DRG: 871 | Disposition: A | Discharge status: Skilled Nursing Facility | Payer: Medicare Other | Attending: Internal Medicine | Admitting: Internal Medicine

## 2018-02-08 DIAGNOSIS — Z7901 Long term (current) use of anticoagulants: Secondary | ICD-10-CM

## 2018-02-08 DIAGNOSIS — Z8249 Family history of ischemic heart disease and other diseases of the circulatory system: Secondary | ICD-10-CM | POA: Diagnosis not present

## 2018-02-08 DIAGNOSIS — Z880 Allergy status to penicillin: Secondary | ICD-10-CM

## 2018-02-08 DIAGNOSIS — C711 Malignant neoplasm of frontal lobe: Secondary | ICD-10-CM | POA: Diagnosis present

## 2018-02-08 DIAGNOSIS — N390 Urinary tract infection, site not specified: Secondary | ICD-10-CM | POA: Diagnosis present

## 2018-02-08 DIAGNOSIS — Z1611 Resistance to penicillins: Secondary | ICD-10-CM | POA: Diagnosis present

## 2018-02-08 DIAGNOSIS — Z820 Family history of epilepsy and other diseases of the nervous system: Secondary | ICD-10-CM | POA: Diagnosis not present

## 2018-02-08 DIAGNOSIS — Z87440 Personal history of urinary (tract) infections: Secondary | ICD-10-CM

## 2018-02-08 DIAGNOSIS — Z9089 Acquired absence of other organs: Secondary | ICD-10-CM

## 2018-02-08 DIAGNOSIS — J150 Pneumonia due to Klebsiella pneumoniae: Secondary | ICD-10-CM | POA: Diagnosis present

## 2018-02-08 DIAGNOSIS — Z86718 Personal history of other venous thrombosis and embolism: Secondary | ICD-10-CM

## 2018-02-08 DIAGNOSIS — E872 Acidosis, unspecified: Secondary | ICD-10-CM

## 2018-02-08 DIAGNOSIS — Z91018 Allergy to other foods: Secondary | ICD-10-CM | POA: Diagnosis not present

## 2018-02-08 DIAGNOSIS — R5381 Other malaise: Secondary | ICD-10-CM | POA: Diagnosis not present

## 2018-02-08 DIAGNOSIS — Z87891 Personal history of nicotine dependence: Secondary | ICD-10-CM

## 2018-02-08 DIAGNOSIS — R531 Weakness: Secondary | ICD-10-CM | POA: Diagnosis not present

## 2018-02-08 DIAGNOSIS — D696 Thrombocytopenia, unspecified: Secondary | ICD-10-CM | POA: Diagnosis present

## 2018-02-08 DIAGNOSIS — C719 Malignant neoplasm of brain, unspecified: Secondary | ICD-10-CM | POA: Diagnosis not present

## 2018-02-08 DIAGNOSIS — J189 Pneumonia, unspecified organism: Secondary | ICD-10-CM | POA: Diagnosis present

## 2018-02-08 DIAGNOSIS — E860 Dehydration: Secondary | ICD-10-CM | POA: Diagnosis present

## 2018-02-08 DIAGNOSIS — A419 Sepsis, unspecified organism: Secondary | ICD-10-CM | POA: Diagnosis present

## 2018-02-08 LAB — URINALYSIS, ROUTINE W REFLEX MICROSCOPIC
Bilirubin Urine: NEGATIVE
Glucose, UA: 50 mg/dL — AB
HGB URINE DIPSTICK: NEGATIVE
KETONES UR: NEGATIVE mg/dL
Leukocytes, UA: NEGATIVE
Nitrite: NEGATIVE
PH: 5 (ref 5.0–8.0)
Protein, ur: NEGATIVE mg/dL
Specific Gravity, Urine: 1.012 (ref 1.005–1.030)

## 2018-02-08 LAB — CBC WITH DIFFERENTIAL/PLATELET
Basophils Absolute: 0 10*3/uL (ref 0.0–0.1)
Basophils Relative: 0 %
Eosinophils Absolute: 0 10*3/uL (ref 0.0–0.7)
Eosinophils Relative: 0 %
HEMATOCRIT: 47.5 % — AB (ref 36.0–46.0)
HEMOGLOBIN: 15.4 g/dL — AB (ref 12.0–15.0)
LYMPHS ABS: 1 10*3/uL (ref 0.7–4.0)
LYMPHS PCT: 16 %
MCH: 30.1 pg (ref 26.0–34.0)
MCHC: 32.4 g/dL (ref 30.0–36.0)
MCV: 93 fL (ref 78.0–100.0)
Monocytes Absolute: 0.4 10*3/uL (ref 0.1–1.0)
Monocytes Relative: 6 %
NEUTROS ABS: 4.8 10*3/uL (ref 1.7–7.7)
NEUTROS PCT: 78 %
Platelets: 139 10*3/uL — ABNORMAL LOW (ref 150–400)
RBC: 5.11 MIL/uL (ref 3.87–5.11)
RDW: 14.3 % (ref 11.5–15.5)
WBC: 6.1 10*3/uL (ref 4.0–10.5)

## 2018-02-08 LAB — COMPREHENSIVE METABOLIC PANEL
ALK PHOS: 59 U/L (ref 38–126)
ALT: 30 U/L (ref 14–54)
AST: 22 U/L (ref 15–41)
Albumin: 3.3 g/dL — ABNORMAL LOW (ref 3.5–5.0)
Anion gap: 10 (ref 5–15)
BUN: 21 mg/dL — AB (ref 6–20)
CALCIUM: 9.1 mg/dL (ref 8.9–10.3)
CO2: 25 mmol/L (ref 22–32)
CREATININE: 0.74 mg/dL (ref 0.44–1.00)
Chloride: 107 mmol/L (ref 101–111)
Glucose, Bld: 177 mg/dL — ABNORMAL HIGH (ref 65–99)
Potassium: 4.1 mmol/L (ref 3.5–5.1)
Sodium: 142 mmol/L (ref 135–145)
Total Bilirubin: 0.8 mg/dL (ref 0.3–1.2)
Total Protein: 7 g/dL (ref 6.5–8.1)

## 2018-02-08 LAB — I-STAT CG4 LACTIC ACID, ED
LACTIC ACID, VENOUS: 2.9 mmol/L — AB (ref 0.5–1.9)
Lactic Acid, Venous: 2.68 mmol/L (ref 0.5–1.9)

## 2018-02-08 MED ORDER — SODIUM CHLORIDE 0.9 % IV BOLUS
500.0000 mL | Freq: Once | INTRAVENOUS | Status: AC
  Administered 2018-02-08: 500 mL via INTRAVENOUS

## 2018-02-08 MED ORDER — SODIUM CHLORIDE 0.9 % IJ SOLN
INTRAMUSCULAR | Status: AC
  Filled 2018-02-08: qty 50

## 2018-02-08 MED ORDER — SODIUM CHLORIDE 0.9 % IV SOLN
2.0000 g | Freq: Once | INTRAVENOUS | Status: AC
  Administered 2018-02-08: 2 g via INTRAVENOUS
  Filled 2018-02-08: qty 2

## 2018-02-08 MED ORDER — SODIUM CHLORIDE 0.9 % IV BOLUS
1000.0000 mL | Freq: Once | INTRAVENOUS | Status: AC
  Administered 2018-02-08: 1000 mL via INTRAVENOUS

## 2018-02-08 MED ORDER — IOPAMIDOL (ISOVUE-300) INJECTION 61%
INTRAVENOUS | Status: AC
  Filled 2018-02-08: qty 100

## 2018-02-08 MED ORDER — IOPAMIDOL (ISOVUE-300) INJECTION 61%
100.0000 mL | Freq: Once | INTRAVENOUS | Status: AC | PRN
  Administered 2018-02-08: 100 mL via INTRAVENOUS

## 2018-02-08 MED ORDER — SODIUM CHLORIDE 0.9 % IV SOLN
Freq: Once | INTRAVENOUS | Status: AC
  Administered 2018-02-08 – 2018-02-09 (×2): via INTRAVENOUS

## 2018-02-08 NOTE — ED Provider Notes (Signed)
[facility] DEPT Provider Note   CSN: [id number] Arrival date & time: 02/08/18  1402     History   Chief Complaint Chief Complaint  Patient presents with  . Urinary Tract Infection  . cancer patient    HPI Donna Valentine is a 77 y.o. female.  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  The patient has had progressive functional decline over the last several weeks.  She has been on Cipro for UTI that has been treated twice, with a total of 4 weeks of Cipro.  Over the last week or 2, she is had progressive worsening fatigue, weakness, and falls.  She is having difficulty even moving around her house.  She has had decreased appetite.  She had increased confusion.  She was seen by her oncologist today, who recommended admission for treatment of UTI and delirium.  He does not feel this is related to her brain disease as she seems to be responding well to treatment.  Currently, the patient states she feels very tired.  She denies any pain.  Denies any shortness of breath.  She has had a mild cough.  No other medical complaints.  Per review of records, UCx 4/11 grew Klebsiella pneumoniae, R to amp and macrobid but sensitive to Cipro (<0.25). Pt has been on Cipro.  Past Medical History:  Diagnosis Date  . GBM (glioblastoma multiforme) [facility])     Patient Active Problem List   Diagnosis Date Noted  . Debility 02/08/2018  . Dehydration 02/08/2018  . Oral candida 01/08/2017  . Goals of care, counseling/discussion   . 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 Left-sided weakness 11/20/2016  . 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 HYPERCHOLESTEROLEMIA, BORDERLINE 07/28/2008  . ANEMIA, MILD 07/28/2008  . DIVERTICULOSIS OF COLON 07/28/2008  . HEMATURIA, HX OF 07/28/2008  . COLONIC POLYPS 07/27/2008  . RIGHT  BUNDLE BRANCH BLOCK 07/27/2008  . PREMATURE ATRIAL CONTRACTIONS 07/27/2008  . HEMORRHOIDS 07/27/2008  . VENOUS INSUFFICIENCY 07/27/2008  . BACK PAIN, LUMBAR 07/27/2008  . SCOLIOSIS 07/27/2008  . PALPITATIONS, HX OF 07/27/2008    Past Surgical History:  Procedure Laterality Date  . APPENDECTOMY  1994  . APPLICATION OF CRANIAL NAVIGATION N/A 11/28/2016   Procedure: APPLICATION OF CRANIAL NAVIGATION;  Surgeon: Ashok Pall, MD;  Location: [facility];  Service: Neurosurgery;  Laterality: N/A;  . CESAREAN SECTION     x3  . CRANIOTOMY Right 11/28/2016   Procedure: STERIOTACTIC PARIETAL CRANIOTOMY FOR RIGHT FRONTAL TUMOR RESECTION WITH BRAINLAB;  Surgeon: Ashok Pall, MD;  Location: [facility];  Service: Neurosurgery;  Laterality: Right;     OB History   None      Home Medications    Prior to Admission medications   Medication Sig Start Date End Date Taking? Authorizing Provider  acetaminophen (TYLENOL) 500 MG tablet Take 500-1,000 mg by mouth daily as needed.   Yes [provider]  apixaban (ELIQUIS) 5 MG TABS tablet Take 2 tablets 10 mg by mouth twice a day for  10 days then take 1 tablet  mg by mouth twice a day   Yes [provider]  ciprofloxacin (CIPRO) 500 MG tablet Take 1 tablet (500 mg total) by mouth 2 (two) times daily. 02/04/18  Yes Vaslow, Acey Lav, MD  dexamethasone (DECADRON) 1 MG tablet Take 2 mg by mouth daily with breakfast.    Yes [provider]  levETIRAcetam (KEPPRA) 500 MG tablet Take 1 tablet (500 mg total) by mouth 2 (two) times daily. 01/26/17  Yes Tyler Pita, MD  nystatin (MYCOSTATIN) 100000 UNIT/ML suspension Take 5 mLs by mouth 3 (three) times daily. 12/26/17  Yes [provider]  nystatin (NYSTATIN) powder Apply 100,000 g topically daily as needed.   Yes [provider]  ondansetron (ZOFRAN) 8 MG tablet Take 1 tablet (8 mg total) by mouth every 8 (eight) hours as needed for nausea. 12/15/16  Yes Brunetta Genera, MD    potassium chloride (K-DUR,KLOR-CON) 10 MEQ tablet Take 1 tablet by mouth See admin instructions. Twice a week (Wed/Sat) 11/26/17  Yes [provider]  primidone (MYSOLINE) 50 MG tablet Take 50 mg by mouth 2 (two) times daily.    Yes [provider]  senna (SENOKOT) 8.6 MG TABS tablet Take 1 tablet by mouth daily.    Yes [provider]    Family History Family History  Problem Relation Age of Onset  . Heart disease Mother   . Alzheimer's disease Brother   . Cancer Neg Hx     Social History Social History   Tobacco Use  . Smoking status: Former Smoker    Last attempt to quit: 11/26/1991    Years since quitting: 26.2  . Smokeless tobacco: Never Used  Substance Use Topics  . Alcohol use: No  . Drug use: No     Allergies   Penicillins and Food   Review of Systems Review of Systems  Constitutional: Positive for fatigue. Negative for chills and fever.  HENT: Negative for congestion and rhinorrhea.   Eyes: Negative for visual disturbance.  Respiratory: Negative for cough, shortness of breath and wheezing.   Cardiovascular: Negative for chest pain and leg swelling.  Gastrointestinal: Negative for abdominal pain, diarrhea, nausea and vomiting.  Genitourinary: Positive for dysuria and frequency. Negative for flank pain.  Musculoskeletal: Negative for neck pain and neck stiffness.  Skin: Negative for rash and wound.  Allergic/Immunologic: Negative for immunocompromised state.  Neurological: Positive for weakness. Negative for syncope and headaches.  All other systems reviewed and are negative.    Physical Exam Updated Vital Signs BP (!) 161/71   Pulse 62   Temp 98.4 F (36.9 C) (Oral)   Resp 16   Ht 5\' 5"  (1.651 m)   Wt 90.3 kg (199 lb)   SpO2 97%   BMI 33.12 kg/m   Physical Exam  Constitutional: She is oriented to person, place, and time. She appears well-developed and well-nourished. No distress.  HENT:  Head: Normocephalic and  atraumatic.  Dry MM  Eyes: Conjunctivae are normal.  Neck: Neck supple.  Cardiovascular: Normal rate, regular rhythm and normal heart sounds. Exam reveals no friction rub.  No murmur heard. Pulmonary/Chest: Effort normal and breath sounds normal. No respiratory distress. She has no wheezes. She has no rales.  Abdominal: Soft. Bowel sounds are normal. She exhibits no distension. There is tenderness (mild, suprapubic). There is no rebound and no guarding.  Musculoskeletal: She exhibits no edema.  Neurological: She is alert and oriented to person, place, and time. She exhibits normal muscle tone.  Skin: Skin is warm. Capillary refill takes less than 2 seconds.  Psychiatric: She has a normal mood and affect.  Nursing note and vitals reviewed.    ED Treatments / Results  Labs (all labs ordered are listed, but only abnormal results are displayed) Labs Reviewed  CBC WITH DIFFERENTIAL/PLATELET - Abnormal; Notable for the  following components:      Result Value   Hemoglobin 15.4 (*)    HCT 47.5 (*)    Platelets 139 (*)    All other components within normal limits  COMPREHENSIVE METABOLIC PANEL - Abnormal; Notable for the following components:   Glucose, Bld 177 (*)    BUN 21 (*)    Albumin 3.3 (*)    All other components within normal limits  URINALYSIS, ROUTINE W REFLEX MICROSCOPIC - Abnormal; Notable for the following components:   Glucose, UA 50 (*)    All other components within normal limits  I-STAT CG4 LACTIC ACID, ED - Abnormal; Notable for the following components:   Lactic Acid, Venous 2.90 (*)    All other components within normal limits  I-STAT CG4 LACTIC ACID, ED - Abnormal; Notable for the following components:   Lactic Acid, Venous 2.68 (*)    All other components within normal limits  URINE CULTURE  CULTURE, BLOOD (ROUTINE X 2)  CULTURE, BLOOD (ROUTINE X 2)  LACTIC ACID, PLASMA  LACTIC ACID, PLASMA  PROCALCITONIN    EKG None  Radiology Dg Chest 2  View  Result Date: 02/08/2018 CLINICAL DATA:  Altered mental status EXAM: CHEST - 2 VIEW COMPARISON:  11/19/2016 FINDINGS: Low volume chest. Cardiomegaly and aortic tortuosity is stable, accentuated by scoliotic curvature. There is no edema, consolidation, effusion, or pneumothorax. IMPRESSION: 1. Limited low volume chest.  No definite acute finding. 2. Chronic cardiomegaly. Electronically Signed   By: Monte Fantasia M.D.   On: 02/08/2018 17:38   Ct Head Wo Contrast  Result Date: 02/08/2018 CLINICAL DATA:  Altered level of consciousness, unexplained EXAM: CT HEAD WITHOUT CONTRAST TECHNIQUE: Contiguous axial images were obtained from the base of the skull through the vertex without intravenous contrast. COMPARISON:  01/07/2018 FINDINGS: Brain: Low-density and expansion at the level of the treated right frontal GBM. There is low-density throughout the cerebral white matter and deep gray nuclei, stable. No evidence of acute infarct, hemorrhage, hydrocephalus, or collection. Vascular: No hyperdense vessel or unexpected calcification. Skull: Unremarkable right frontal craniotomy. Sinuses/Orbits: Negative IMPRESSION: Treated right frontal GBM and extensive white matter disease. No acute finding or detected change from most recent staging MRI 01/07/2018. Electronically Signed   By: Monte Fantasia M.D.   On: 02/08/2018 18:21   Ct Abdomen Pelvis W Contrast  Result Date: 02/08/2018 CLINICAL DATA:  Dysuria for 5 weeks.  Patient with brain cancer. EXAM: CT ABDOMEN AND PELVIS WITH CONTRAST TECHNIQUE: Multidetector CT imaging of the abdomen and pelvis was performed using the standard protocol following bolus administration of intravenous contrast. CONTRAST:  172mL ISOVUE-300 IOPAMIDOL (ISOVUE-300) INJECTION 61% COMPARISON:  November 20, 2004 FINDINGS: Lower chest: Scattered opacity in the right base and right middle lobe is favored to be atelectasis. Opacity in the left base and lingula could represent atelectasis or  infiltrate. There does appear to be some volume loss on the left. Cardiomegaly. 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 Hepatobiliary: No focal liver abnormality is seen. No gallstones, gallbladder wall thickening, or biliary dilatation. Pancreas: Unremarkable. No pancreatic ductal dilatation or surrounding inflammatory changes. Spleen: Normal in size without focal abnormality. Adrenals/Urinary Tract: Left kidney is relatively atrophic versus the right. The kidneys, ureters, and bladder are otherwise normal. Stomach/Bowel: The distal esophagus, stomach, and small bowel are normal. The colon is normal. The patient is status post appendectomy. Vascular/Lymphatic: Mild atherosclerotic change in the nonaneurysmal tortuous aorta. No adenopathy. Reproductive: Uterus and bilateral adnexa are unremarkable. Other: No abdominal wall  hernia or abnormality. No abdominopelvic ascites. Musculoskeletal: Scoliotic curvature of the thoracolumbar spine. IMPRESSION: 1. Scattered opacity in the bases, right middle lobe, and lingula are favored to represent atelectasis. It would be difficult to completely exclude infiltrate on the left. 2. 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 Recommend annual imaging followup by CTA or MRA. This recommendation follows 2010 ACCF/AHA/AATS/ACR/ASA/SCA/SCAI/SIR/STS/SVM Guidelines for the Diagnosis and Management of Patients with Thoracic Aortic Disease. Circulation. 2010; 121: C588-F027 3. 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 4. Scoliosis. Electronically Signed   By: Dorise Bullion III M.D   On: 02/08/2018 22:03    Procedures .Critical Care Performed by: Duffy Bruce, MD Authorized by: Duffy Bruce, MD   Critical care provider statement:    Critical care time (minutes):  35   Critical care time was exclusive of:  Separately billable procedures and treating other patients and teaching time   Critical care was necessary to treat or prevent imminent or  life-threatening deterioration of the following conditions:  Sepsis, circulatory failure and dehydration   Critical care was time spent personally by me on the following activities:  Development of treatment plan with patient or surrogate, discussions with consultants, evaluation of patient's response to treatment, examination of patient, obtaining history from patient or surrogate, ordering and performing treatments and interventions, ordering and review of laboratory studies, ordering and review of radiographic studies, pulse oximetry, re-evaluation of patient's condition and review of old charts   I assumed direction of critical care for this patient from another provider in my specialty: no     (including critical care time)  Medications Ordered in ED Medications  iopamidol (ISOVUE-300) 61 % injection (has no administration in time range)  sodium chloride 0.9 % injection (has no administration in time range)  ceFEPIme (MAXIPIME) 2 g in sodium chloride 0.9 % 100 mL IVPB (0 g Intravenous Stopped 02/08/18 1851)  sodium chloride 0.9 % bolus 500 mL (0 mLs Intravenous Stopped 02/08/18 1834)  sodium chloride 0.9 % bolus 1,000 mL (0 mLs Intravenous Stopped 02/08/18 1929)  0.9 %  sodium chloride infusion ( Intravenous New Bag/Given 02/08/18 2124)  iopamidol (ISOVUE-300) 61 % injection 100 mL (100 mLs Intravenous Contrast Given 02/08/18 2152)     Initial Impression / Assessment and Plan / ED Course  I have reviewed the triage vital signs and the nursing notes.  Pertinent labs & imaging results that were available during my care of the patient were reviewed by me and considered in my medical decision making (see chart for details).     77 year old female here with generalized weakness, increasing falls, difficulty walking due to recent diagnosis of UTI.  On arrival, she does appear tachycardic and dehydrated.  Lactic acid is 2.9.  Patient was subsequent started empirically on cefepime based on recent  cultures and given IV fluids.  Will be cautious on 30/kg, however, given that she does have some lower extremity edema with a history of heart failure.  Remainder of lab work is overall reassuring.  She has a normal white count.  She hasNo evidence of kidney injury.  Urinalysis, interestingly, does appear normal here.  Could be partially treated UTI with Cipro, or pt may just have worsening FTT 2/2 Cipro use and recent infection.  Discussed with Hospitalist. Will check CT for eval of occult renal abscess, admit. IVF, IV ABX given. Pt improving clinically. LA remains elevated btu is downtrending.  Final Clinical Impressions(s) / ED Diagnoses   Final diagnoses:  Generalized weakness  Lactic acidosis  ED Discharge Orders    None       Duffy Bruce, MD 02/08/18 2317

## 2018-02-08 NOTE — ED Notes (Signed)
RN AND MD NOTIFIED OF PATIENT'S LACTIC ACID LEVEL OF 2.90 

## 2018-02-08 NOTE — H&P (Signed)
Donna Valentine QVZ:[id number] DOB: 16-May-1941 DOA: 02/08/2018     PCP:  none Outpatient Specialists:      Oncology   Dr. Mickeal Skinner Dr. Simmie Davies at [facility].   Patient arrived to ER on 02/08/18 at 1402  Patient coming from:  home Lives  With family    Chief Complaint:  Chief Complaint  Patient presents with  . Urinary Tract Infection  . cancer patient    HPI: Donna Valentine is a 77 y.o. female with medical history significant of right frontal glioblastoma sp ressection XRT, recurrent UTI, hx of DVT, Left side weakness,      Presented with 5 weeks of recurrent dysuria and generalized fatigue in the past has been treated with Cipro for possible UTI she has finished 4 weeks of Cipro together.  She has been getting progressive fatigue and falls decreased appetite and overall progressive decline increasing confusion trouble ambulating moving around the house the oncologist have seen her today and recommended admission for UTI treatment and rehydration. Reports she in general was very fatigued. Family states she is a bit sort of breath. no chest pain.  Last urine culture from 11of April showed Klebsiella pneumonia sensitive to Cipro but resistant to ampicillin and Macrobid   Regarding pertinent Chronic problems: Regarding brain tumor patient seems to be responding to increase Decadron and Avastin   While in ER:  Patient seemed to be dehydrated and tachycardic with elevated lactic acid up to 2.9 was empirically started on cefepime and rehydrated.  UA at the later time showed no evidence of UTI Following Medications were ordered in ER: Medications  ceFEPIme (MAXIPIME) 2 g in sodium chloride 0.9 % 100 mL IVPB (0 g Intravenous Stopped 02/08/18 1851)  sodium chloride 0.9 % bolus 500 mL (0 mLs Intravenous Stopped 02/08/18 1834)  sodium chloride 0.9 % bolus 1,000 mL (0 mLs Intravenous Stopped 02/08/18 1929)    Significant initial  Findings: Abnormal Labs Reviewed    CBC WITH DIFFERENTIAL/PLATELET - Abnormal; Notable for the following components:      Result Value   Hemoglobin 15.4 (*)    HCT 47.5 (*)    Platelets 139 (*)    All other components within normal limits  COMPREHENSIVE METABOLIC PANEL - Abnormal; Notable for the following components:   Glucose, Bld 177 (*)    BUN 21 (*)    Albumin 3.3 (*)    All other components within normal limits  URINALYSIS, ROUTINE W REFLEX MICROSCOPIC - Abnormal; Notable for the following components:   Glucose, UA 50 (*)    All other components within normal limits  I-STAT CG4 LACTIC ACID, ED - Abnormal; Notable for the following components:   Lactic Acid, Venous 2.90 (*)    All other components within normal limits  I-STAT CG4 LACTIC ACID, ED - Abnormal; Notable for the following components:   Lactic Acid, Venous 2.68 (*)    All other components within normal limits     Na 142 K 4.1  Cr   stable,   Lab Results  Component Value Date   CREATININE 0.74 02/08/2018   CREATININE 0.65 02/04/2018   CREATININE 0.73 12/08/2017    WBC  6.1  HG/HCT stable,      Component Value Date/Time   HGB 15.4 (H) 02/08/2018 1712   HGB 15.1 02/04/2018 1055   HGB 13.3 06/16/2017 0917   HCT 47.5 (H) 02/08/2018 1712   HCT 40.8 06/16/2017 0917   Lactic acid 2.9 -> 2.68  Troponin (Point of Care Test) No results for input(s): TROPIPOC in the last 72 hours.  BNP (last 3 results) Recent Labs    08/18/17 0700  BNP 84.0    ProBNP (last 3 results) No results for input(s): PROBNP in the last 8760 hours.  Lactic Acid, Venous    Component Value Date/Time   LATICACIDVEN 2.68 ([place]) 02/08/2018 2033      UA  no evidence of UTI    CT HEAD NON acute Treated right frontal GBM  CXR - NON acute chronic cardiomegaly  CTabd/pelvis - non acute, questionable infiltrate.   ECG:  Personally reviewed by me showing: HR : 67 Rhythm: RBBB,    no evidence of ischemic changes QTC 484    ED Triage Vitals  Enc Vitals  Group     BP 02/08/18 1432 (!) 138/103     Pulse Rate 02/08/18 1432 (!) 116     Resp 02/08/18 1432 18     Temp 02/08/18 1432 98.4 F (36.9 C)     Temp Source 02/08/18 1432 Oral     SpO2 02/08/18 1432 96 %     Weight 02/08/18 1507 199 lb (90.3 kg)     Height 02/08/18 1507 5\' 5"  (1.651 m)     Head Circumference --      Peak Flow --      Pain Score 02/08/18 1507 0     Pain Loc --      Pain Edu? --      Excl. in [place]? --   TMAX(24)@       Latest  Blood pressure (!) 176/89, pulse 68, temperature 98.4 F (36.9 C), temperature source Oral, resp. rate 16, height 5\' 5"  (1.651 m), weight 90.3 kg (199 lb), SpO2 97 %.   Hospitalist was called for admission for dehydration elevated lactic acid   Review of Systems:    Pertinent positives include: chills, fatigue,dysuria,  dyspnea on exertion  Constitutional:  No weight loss, night sweats, Fevers,  weight loss  HEENT:  No headaches, Difficulty swallowing,Tooth/dental problems,Sore throat,  No sneezing, itching, ear ache, nasal congestion, post nasal drip,  Cardio-vascular:  No chest pain, Orthopnea, PND, anasarca, dizziness, palpitations.no Bilateral lower extremity swelling  GI:  No heartburn, indigestion, abdominal pain, nausea, vomiting, diarrhea, change in bowel habits, loss of appetite, melena, blood in stool, hematemesis Resp:  no shortness of breath at rest. No, No excess mucus, no productive cough, No non-productive cough, No coughing up of blood.No change in color of mucus.No wheezing. Skin:  no rash or lesions. No jaundice GU:  no change in color of urine, no urgency or frequency. No straining to urinate.  No flank pain.  Musculoskeletal:  No joint pain or no joint swelling. No decreased range of motion. No back pain.  Psych:  No change in mood or affect. No depression or anxiety. No memory loss.  Neuro: no localizing neurological complaints, no tingling, no weakness, no double vision, no gait abnormality, no slurred speech,  no confusion  As per HPI otherwise 10 point review of systems negative.   Past Medical History:   Past Medical History:  Diagnosis Date  . GBM (glioblastoma multiforme) ([facility])       Past Surgical History:  Procedure Laterality Date  . APPENDECTOMY  1994  . APPLICATION OF CRANIAL NAVIGATION N/A 11/28/2016   Procedure: APPLICATION OF CRANIAL NAVIGATION;  Surgeon: Ashok Pall, MD;  Location: [facility];  Service: Neurosurgery;  Laterality: N/A;  . CESAREAN SECTION  x3  . CRANIOTOMY Right 11/28/2016   Procedure: STERIOTACTIC PARIETAL CRANIOTOMY FOR RIGHT FRONTAL TUMOR RESECTION WITH BRAINLAB;  Surgeon: Ashok Pall, MD;  Location: [facility];  Service: Neurosurgery;  Laterality: Right;    Social History:  Ambulatory  Gilford Rile     reports that she quit smoking about 26 years ago. She has never used smokeless tobacco. She reports that she does not drink alcohol or use drugs.     Family History:   Family History  Problem Relation Age of Onset  . Heart disease Mother   . Alzheimer's disease Brother   . Cancer Neg Hx     Allergies: Allergies  Allergen Reactions  . Penicillins Other (See Comments)    Hallucination Has patient had a PCN reaction causing immediate rash, facial/tongue/throat swelling, SOB or lightheadedness with hypotension: No Has patient had a PCN reaction causing severe rash involving mucus membranes or skin necrosis: No Has patient had a PCN reaction that required hospitalization No Has patient had a PCN reaction occurring within the last 10 years: No If all of the above answers are "NO", then may proceed with Cephalosporin use.   . Food     Pineapple-Nausea/vomiting     Prior to Admission medications   Medication Sig Start Date End Date Taking? Authorizing Provider  acetaminophen (TYLENOL) 500 MG tablet Take 500-1,000 mg by mouth daily as needed.   Yes [provider]  apixaban (ELIQUIS) 5 MG TABS tablet Take 2 tablets 10 mg by mouth twice a day for   10 days then take 1 tablet  mg by mouth twice a day   Yes [provider]  ciprofloxacin (CIPRO) 500 MG tablet Take 1 tablet (500 mg total) by mouth 2 (two) times daily. 02/04/18  Yes Vaslow, Acey Lav, MD  dexamethasone (DECADRON) 1 MG tablet Take 2 mg by mouth daily with breakfast.    Yes [provider]  levETIRAcetam (KEPPRA) 500 MG tablet Take 1 tablet (500 mg total) by mouth 2 (two) times daily. 01/26/17  Yes Tyler Pita, MD  nystatin (MYCOSTATIN) 100000 UNIT/ML suspension Take 5 mLs by mouth 3 (three) times daily. 12/26/17  Yes [provider]  nystatin (NYSTATIN) powder Apply 100,000 g topically daily as needed.   Yes [provider]  ondansetron (ZOFRAN) 8 MG tablet Take 1 tablet (8 mg total) by mouth every 8 (eight) hours as needed for nausea. 12/15/16  Yes Brunetta Genera, MD  potassium chloride (K-DUR,KLOR-CON) 10 MEQ tablet Take 1 tablet by mouth See admin instructions. Twice a week (Wed/Sat) 11/26/17  Yes [provider]  primidone (MYSOLINE) 50 MG tablet Take 50 mg by mouth 2 (two) times daily.    Yes [provider]  senna (SENOKOT) 8.6 MG TABS tablet Take 1 tablet by mouth daily.    Yes [provider]   Physical Exam: Blood pressure (!) 176/89, pulse 68, temperature 98.4 F (36.9 C), temperature source Oral, resp. rate 16, height 5\' 5"  (1.651 m), weight 90.3 kg (199 lb), SpO2 97 %. 1. General:  in No Acute distress   Chronically ill -appearing 2. Psychological: Alert and  Oriented 3. Head/ENT:    Dry Mucous Membranes                          Head Non traumatic, neck supple  Poor Dentition 4. SKIN:   decreased Skin turgor,  Skin clean Dry and intact no rash 5. Heart: Regular rate and rhythm no  Murmur, no Rub or gallop 6. Lungs:  Clear to auscultation bilaterally, no wheezes or crackles   7. Abdomen: Soft,  non-tender,  distended   obese  bowel sounds present 8. Lower extremities: no  clubbing, cyanosis, mild left worse than right  edema 9. Neurologically diminished on the left  10. MSK: Normal range of motion   LABS:     Recent Labs  Lab 02/04/18 1055 02/08/18 1712  WBC 6.8 6.1  NEUTROABS  --  4.8  HGB 15.1 15.4*  HCT 46.2 47.5*  MCV 92.4 93.0  PLT 103* 867*   Basic Metabolic Panel: Recent Labs  Lab 02/04/18 1055 02/08/18 1712  NA 142 142  K 3.9 4.1  CL 109 107  CO2 23 25  GLUCOSE 157* 177*  BUN 21 21*  CREATININE 0.65 0.74  CALCIUM 8.8 9.1      Recent Labs  Lab 02/04/18 1055 02/08/18 1712  AST 15 22  ALT 23 30  ALKPHOS 60 59  BILITOT 0.4 0.8  PROT 6.3* 7.0  ALBUMIN 3.1* 3.3*   No results for input(s): LIPASE, AMYLASE in the last 168 hours. No results for input(s): AMMONIA in the last 168 hours.    HbA1C: No results for input(s): HGBA1C in the last 72 hours. CBG: No results for input(s): GLUCAP in the last 168 hours.    Urine analysis:    Component Value Date/Time   COLORURINE YELLOW 02/08/2018 1712   APPEARANCEUR CLEAR 02/08/2018 1712   LABSPEC 1.012 02/08/2018 1712   LABSPEC 1.020 04/28/2017 0909   PHURINE 5.0 02/08/2018 1712   GLUCOSEU 50 (A) 02/08/2018 1712   GLUCOSEU Negative 04/28/2017 0909   HGBUR NEGATIVE 02/08/2018 1712   BILIRUBINUR NEGATIVE 02/08/2018 1712   BILIRUBINUR Negative 04/28/2017 0909   KETONESUR NEGATIVE 02/08/2018 1712   PROTEINUR NEGATIVE 02/08/2018 1712   UROBILINOGEN 0.2 04/28/2017 0909   NITRITE NEGATIVE 02/08/2018 1712   LEUKOCYTESUR NEGATIVE 02/08/2018 1712   LEUKOCYTESUR Negative 04/28/2017 0909       Cultures:    Component Value Date/Time   SDES  01/21/2018 1215    URINE, CLEAN CATCH Performed at [facility], [employer] [address], [place], [place] [address]    SPECREQUEST  01/21/2018 1215    NONE Performed at [facility], [place] [address], [place], [place] [address]    CULT >=100,000 COLONIES/mL KLEBSIELLA PNEUMONIAE (A) 01/21/2018  1215   REPTSTATUS 01/23/2018 FINAL 01/21/2018 1215     Radiological Exams on Admission: Dg Chest 2 View  Result Date: 02/08/2018 CLINICAL DATA:  Altered mental status EXAM: CHEST - 2 VIEW COMPARISON:  11/19/2016 FINDINGS: Low volume chest. Cardiomegaly and aortic tortuosity is stable, accentuated by scoliotic curvature. There is no edema, consolidation, effusion, or pneumothorax. IMPRESSION: 1. Limited low volume chest.  No definite acute finding. 2. Chronic cardiomegaly. Electronically Signed   By: Monte Fantasia M.D.   On: 02/08/2018 17:38   Ct Head Wo Contrast  Result Date: 02/08/2018 CLINICAL DATA:  Altered level of consciousness, unexplained EXAM: CT HEAD WITHOUT CONTRAST TECHNIQUE: Contiguous axial images were obtained from the base of the skull through the vertex without intravenous contrast. COMPARISON:  01/07/2018 FINDINGS: Brain: Low-density and expansion at the level of the treated right frontal GBM. There is low-density throughout the cerebral white matter and deep gray nuclei, stable. No evidence of  acute infarct, hemorrhage, hydrocephalus, or collection. Vascular: No hyperdense vessel or unexpected calcification. Skull: Unremarkable right frontal craniotomy. Sinuses/Orbits: Negative IMPRESSION: Treated right frontal GBM and extensive white matter disease. No acute finding or detected change from most recent staging MRI 01/07/2018. Electronically Signed   By: Monte Fantasia M.D.   On: 02/08/2018 18:21   Ct Abdomen Pelvis W Contrast  Result Date: 02/08/2018 CLINICAL DATA:  Dysuria for 5 weeks.  Patient with brain cancer. EXAM: CT ABDOMEN AND PELVIS WITH CONTRAST TECHNIQUE: Multidetector CT imaging of the abdomen and pelvis was performed using the standard protocol following bolus administration of intravenous contrast. CONTRAST:  146mL ISOVUE-300 IOPAMIDOL (ISOVUE-300) INJECTION 61% COMPARISON:  November 20, 2004 FINDINGS: Lower chest: Scattered opacity in the right base and right middle  lobe is favored to be atelectasis. Opacity in the left base and lingula could represent atelectasis or infiltrate. There does appear to be some volume loss on the left. Cardiomegaly. 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 Hepatobiliary: No focal liver abnormality is seen. No gallstones, gallbladder wall thickening, or biliary dilatation. Pancreas: Unremarkable. No pancreatic ductal dilatation or surrounding inflammatory changes. Spleen: Normal in size without focal abnormality. Adrenals/Urinary Tract: Left kidney is relatively atrophic versus the right. The kidneys, ureters, and bladder are otherwise normal. Stomach/Bowel: The distal esophagus, stomach, and small bowel are normal. The colon is normal. The patient is status post appendectomy. Vascular/Lymphatic: Mild atherosclerotic change in the nonaneurysmal tortuous aorta. No adenopathy. Reproductive: Uterus and bilateral adnexa are unremarkable. Other: No abdominal wall hernia or abnormality. No abdominopelvic ascites. Musculoskeletal: Scoliotic curvature of the thoracolumbar spine. IMPRESSION: 1. Scattered opacity in the bases, right middle lobe, and lingula are favored to represent atelectasis. It would be difficult to completely exclude infiltrate on the left. 2. 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 Recommend annual imaging followup by CTA or MRA. This recommendation follows 2010 ACCF/AHA/AATS/ACR/ASA/SCA/SCAI/SIR/STS/SVM Guidelines for the Diagnosis and Management of Patients with Thoracic Aortic Disease. Circulation. 2010; 121: Y865-H846 3. 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 4. Scoliosis. Electronically Signed   By: Dorise Bullion III M.D   On: 02/08/2018 22:03    Chart has been reviewed    Assessment/Plan   77 y.o. female with medical history significant of right frontal glioblastoma, recurrent UTI   Admitted for dehydration and elevated lactic acid Ct showing possible pneumonia  Present on Admission: .  Glioblastoma multiforme of frontal lobe ([place]) -notify oncology the patient has been admitted . Debility-order PT OT evaluation . Dehydration we will rehydrate and follow . CAP (community acquired pneumonia) patient did endorse some shortness of breath for though no coughing.  Will treat for now as this could be potentially because of progressive debility see if improves clinically.  Administer IV antibiotics Rocephin and azithromycin oxygen as needed History of DVT continue Eliquis Lactic acidosis will rehydrate and follow lactic acid levels blood pressure stable Sepsis physiology initially was meeting sepsis criteria with elevated heart rate and respirations currently improving we will continue IV fluids IV antibiotics blood cultures pending Other plan as per orders.  DVT prophylaxis:    Lovenox     Code Status:  FULL CODE as per patient    I had personally discussed CODE STATUS with patient and family  Family Communication:   Family   at  Bedside  plan of care was discussed with   Daughter   Disposition Plan:     likely will need placement for rehabilitation  Would benefit from PT/OT eval prior to DC  ordered                                         Consults called: email Oncology   Admission status:   inpatient      Level of care    medical floor             Toy Baker 02/09/2018, 12:34 AM    [facility]  Pager [phone]   after 2 AM please page floor coverage PA If 7AM-7PM, please contact the day team taking care of the patient  [email or web address]  Password [email or web address]

## 2018-02-08 NOTE — ED Notes (Signed)
I gave critical I Stat CG4 results to MD Lysbeth Galas

## 2018-02-08 NOTE — ED Notes (Signed)
Attempted to start PIV. Was unsuccessful. EDRN Matt notified- Matt attempting PIV placement using USIV.

## 2018-02-08 NOTE — ED Triage Notes (Signed)
Patient c/o dysuria x 5 weeks. Patient hs been taking Cipro. Patient's physician called and stated that the patient needed additional antibiotics and possible admission. Patient has brain cancer.

## 2018-02-08 NOTE — ED Notes (Signed)
Patient in CT

## 2018-02-08 NOTE — ED Notes (Signed)
Bed: WA05 Expected date:  Expected time:  Means of arrival:  Comments: 

## 2018-02-09 ENCOUNTER — Other Ambulatory Visit: Payer: Self-pay

## 2018-02-09 DIAGNOSIS — Z87891 Personal history of nicotine dependence: Secondary | ICD-10-CM

## 2018-02-09 DIAGNOSIS — R531 Weakness: Secondary | ICD-10-CM

## 2018-02-09 DIAGNOSIS — A419 Sepsis, unspecified organism: Principal | ICD-10-CM | POA: Diagnosis present

## 2018-02-09 DIAGNOSIS — C719 Malignant neoplasm of brain, unspecified: Secondary | ICD-10-CM

## 2018-02-09 DIAGNOSIS — R5381 Other malaise: Secondary | ICD-10-CM

## 2018-02-09 DIAGNOSIS — Z86718 Personal history of other venous thrombosis and embolism: Secondary | ICD-10-CM

## 2018-02-09 DIAGNOSIS — C711 Malignant neoplasm of frontal lobe: Secondary | ICD-10-CM

## 2018-02-09 DIAGNOSIS — J189 Pneumonia, unspecified organism: Secondary | ICD-10-CM

## 2018-02-09 DIAGNOSIS — E860 Dehydration: Secondary | ICD-10-CM

## 2018-02-09 LAB — COMPREHENSIVE METABOLIC PANEL
ALK PHOS: 55 U/L (ref 38–126)
ALT: 27 U/L (ref 14–54)
ANION GAP: 11 (ref 5–15)
AST: 20 U/L (ref 15–41)
Albumin: 3 g/dL — ABNORMAL LOW (ref 3.5–5.0)
BUN: 15 mg/dL (ref 6–20)
CALCIUM: 8.6 mg/dL — AB (ref 8.9–10.3)
CHLORIDE: 107 mmol/L (ref 101–111)
CO2: 24 mmol/L (ref 22–32)
CREATININE: 0.51 mg/dL (ref 0.44–1.00)
GFR calc Af Amer: >60 mL/min (ref >60)
Glucose, Bld: 102 mg/dL — ABNORMAL HIGH (ref 65–99)
Potassium: 3.8 mmol/L (ref 3.5–5.1)
SODIUM: 142 mmol/L (ref 135–145)
Total Bilirubin: 0.7 mg/dL (ref 0.3–1.2)
Total Protein: 6.2 g/dL — ABNORMAL LOW (ref 6.5–8.1)

## 2018-02-09 LAB — PROCALCITONIN: Procalcitonin: <0.1 ng/mL

## 2018-02-09 LAB — CBC
HCT: 48.1 % — ABNORMAL HIGH (ref 36.0–46.0)
HEMOGLOBIN: 15.6 g/dL — AB (ref 12.0–15.0)
MCH: 30 pg (ref 26.0–34.0)
MCHC: 32.4 g/dL (ref 30.0–36.0)
MCV: 92.5 fL (ref 78.0–100.0)
PLATELETS: 125 10*3/uL — AB (ref 150–400)
RBC: 5.2 MIL/uL — AB (ref 3.87–5.11)
RDW: 14.6 % (ref 11.5–15.5)
WBC: 6.2 10*3/uL (ref 4.0–10.5)

## 2018-02-09 LAB — TSH: TSH: 5.137 u[IU]/mL — AB (ref 0.350–4.500)

## 2018-02-09 LAB — LACTIC ACID, PLASMA
LACTIC ACID, VENOUS: 2.1 mmol/L — AB (ref 0.5–1.9)
Lactic Acid, Venous: 2.2 mmol/L (ref 0.5–1.9)

## 2018-02-09 LAB — T4, FREE: FREE T4: 1.14 ng/dL (ref 0.82–1.77)

## 2018-02-09 LAB — PHOSPHORUS: Phosphorus: 2.9 mg/dL (ref 2.5–4.6)

## 2018-02-09 LAB — MAGNESIUM: MAGNESIUM: 1.9 mg/dL (ref 1.7–2.4)

## 2018-02-09 MED ORDER — ONDANSETRON HCL 4 MG/2ML IJ SOLN: 4.0000 mg | Freq: Four times a day (QID) | INTRAMUSCULAR | Status: DC | PRN

## 2018-02-09 MED ORDER — ONDANSETRON HCL 4 MG PO TABS: 4.0000 mg | ORAL_TABLET | Freq: Four times a day (QID) | ORAL | Status: DC | PRN

## 2018-02-09 MED ORDER — SODIUM CHLORIDE 0.9 % IV SOLN
1.0000 g | Freq: Every day | INTRAVENOUS | Status: DC
  Administered 2018-02-09 – 2018-02-12 (×4): 1 g via INTRAVENOUS
  Filled 2018-02-09 (×4): qty 1

## 2018-02-09 MED ORDER — LEVETIRACETAM 500 MG PO TABS
500.0000 mg | ORAL_TABLET | Freq: Two times a day (BID) | ORAL | Status: DC
  Administered 2018-02-09 – 2018-02-12 (×7): 500 mg via ORAL
  Filled 2018-02-09 (×7): qty 1

## 2018-02-09 MED ORDER — PRIMIDONE 50 MG PO TABS
50.0000 mg | ORAL_TABLET | Freq: Two times a day (BID) | ORAL | Status: DC
  Administered 2018-02-09 – 2018-02-12 (×7): 50 mg via ORAL
  Filled 2018-02-09 (×8): qty 1

## 2018-02-09 MED ORDER — ACETAMINOPHEN 325 MG PO TABS
650.0000 mg | ORAL_TABLET | Freq: Four times a day (QID) | ORAL | Status: DC | PRN
  Administered 2018-02-09: 650 mg via ORAL
  Filled 2018-02-09: qty 2

## 2018-02-09 MED ORDER — HYDROCODONE-ACETAMINOPHEN 5-325 MG PO TABS: 1.0000 | ORAL_TABLET | ORAL | Status: DC | PRN

## 2018-02-09 MED ORDER — APIXABAN 5 MG PO TABS
5.0000 mg | ORAL_TABLET | Freq: Two times a day (BID) | ORAL | Status: DC
Start: 2018-02-09 — End: 2018-02-12
  Administered 2018-02-09 – 2018-02-12 (×7): 5 mg via ORAL
  Filled 2018-02-09 (×7): qty 1

## 2018-02-09 MED ORDER — AZITHROMYCIN 500 MG IV SOLR
500.0000 mg | INTRAVENOUS | Status: DC
  Administered 2018-02-09 – 2018-02-11 (×3): 500 mg via INTRAVENOUS
  Filled 2018-02-09 (×3): qty 500

## 2018-02-09 MED ORDER — DEXAMETHASONE 4 MG PO TABS
2.0000 mg | ORAL_TABLET | Freq: Every day | ORAL | Status: DC
  Administered 2018-02-09 – 2018-02-11 (×3): 2 mg via ORAL
  Filled 2018-02-09 (×4): qty 1

## 2018-02-09 MED ORDER — SENNA 8.6 MG PO TABS
1.0000 | ORAL_TABLET | Freq: Every day | ORAL | Status: DC
  Administered 2018-02-09 – 2018-02-12 (×4): 8.6 mg via ORAL
  Filled 2018-02-09 (×4): qty 1

## 2018-02-09 MED ORDER — ACETAMINOPHEN 650 MG RE SUPP: 650.0000 mg | Freq: Four times a day (QID) | RECTAL | Status: DC | PRN

## 2018-02-09 NOTE — Evaluation (Signed)
Physical Therapy Evaluation Patient Details Name: Donna Valentine MRN: [id number] DOB: [birth year] Today's Date: 02/09/2018   History of Present Illness  77 y.o. 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 Pt admitted with worsening weakness, Pna, UTI.     Clinical Impression  On eval, pt required Max-Total assist +2 for mobility. She was able to sit EOB with Min guard assist. She was also able to stand x 4 for ~20 seconds at a time. Pt fatigues easily. She is a high fall risk. Discussed d/c plan-family prefers for pt to return home. Will continue to follow and progress activity as tolerated.     Follow Up Recommendations SNF(if pt/family agreeable. If placement is refused, recommend resume HHPT)    Equipment Recommendations  None recommended by PT    Recommendations for Other Services       Precautions / Restrictions Precautions Precautions: Fall Restrictions Weight Bearing Restrictions: No      Mobility  Bed Mobility Overal bed mobility: Needs Assistance Bed Mobility: Supine to Sit;Sit to Supine     Supine to sit: Mod assist;+2 for physical assistance;+2 for safety/equipment;HOB elevated Sit to supine: Total assist;+2 for safety/equipment;+2 for physical assistance   General bed mobility comments: Assist for trunk and bil LEs. Utilized bedpad for scooting, positioning. Increased time.   Transfers Overall transfer level: Needs assistance Equipment used: Rolling walker (2 wheeled) Transfers: Sit to/from Stand Sit to Stand: Max assist;+2 physical assistance;+2 safety/equipment         General transfer comment: Sit to stand x 2 with RW, then x 2 with STEDY. Assist to shift weight anteriorly, rise, stabilize, block L knee, and maintain positioning/grip of L hand on handle of RW/STEDY bar. Pt stood for ~20 seconds each time. High fall risk.   Ambulation/Gait              General Gait Details: NT-pt is nonambulatory  Stairs            Wheelchair Mobility    Modified Rankin (Stroke Patients Only)       Balance Overall balance assessment: Needs assistance Sitting-balance support: Single extremity supported Sitting balance-Leahy Scale: Fair Sitting balance - Comments: L lateral lean as pt fatigues.  Postural control: Left lateral lean Standing balance support: Bilateral upper extremity supported Standing balance-Leahy Scale: Zero                               Pertinent Vitals/Pain Pain Assessment: No/denies pain    Home Living Family/patient expects to be discharged to:: Unsure Living Arrangements: Children Available Help at Discharge: Family;Available 24 hours/day Type of Home: House Home Access: Stairs to enter   [employer] of Steps: 2- bump up stairs in [employer] Home Layout: One level Home Equipment: Other (comment);Walker - 2 wheels;Bedside commode      Prior Function Level of Independence: Needs assistance   Gait / Transfers Assistance Needed: ambulatory with HHPT and RW.   ADL's / Homemaking Assistance Needed: daugters sponge bathe pt        Hand Dominance        Extremity/Trunk Assessment   Upper Extremity Assessment Upper Extremity Assessment: Defer to OT evaluation LUE Deficits / Details: limited movement against gravity LUE Coordination: decreased gross motor;decreased fine motor    Lower Extremity Assessment Lower Extremity Assessment: Generalized weakness;LLE deficits/detail LLE Deficits / Details: hip flex 2/5, knee ext 3-/5, DF/PF  2/5 LLE Sensation: decreased proprioception LLE Coordination: decreased gross motor;decreased fine motor    Cervical / Trunk Assessment Cervical / Trunk Assessment: Normal  Communication   Communication: No difficulties  Cognition Arousal/Alertness: Awake/alert Behavior During Therapy: WFL for tasks assessed/performed Overall Cognitive Status:  Impaired/Different from baseline Area of Impairment: Following commands                       Following Commands: Follows one step commands with increased time       General Comments: slow to process at times but always followed commands      General Comments      Exercises     Assessment/Plan    PT Assessment Patient needs continued PT services  PT Problem List Decreased strength;Decreased mobility;Decreased balance;Decreased activity tolerance;Decreased knowledge of use of DME;Impaired tone;Decreased coordination;Impaired sensation       PT Treatment Interventions DME instruction;Functional mobility training;Balance training;Patient/family education;Therapeutic exercise;Therapeutic activities    PT Goals (Current goals can be found in the Care Plan section)  Acute Rehab PT Goals Patient Stated Goal: be able to go homen and only one person A pt PT Goal Formulation: With family Time For Goal Achievement: 02/23/18 Potential to Achieve Goals: Fair    Frequency Min 3X/week   Barriers to discharge        Co-evaluation   Reason for Co-Treatment: Complexity of the patient's impairments (multi-system involvement);For patient/therapist safety   OT goals addressed during session: ADL's and self-care       AM-PAC PT "6 Clicks" Daily Activity  Outcome Measure Difficulty turning over in bed (including adjusting bedclothes, sheets and blankets)?: Unable Difficulty moving from lying on back to sitting on the side of the bed? : Unable Difficulty sitting down on and standing up from a chair with arms (e.g., wheelchair, bedside commode, etc,.)?: Unable Help needed moving to and from a bed to chair (including a wheelchair)?: Total Help needed walking in hospital room?: Total Help needed climbing 3-5 steps with a railing? : Total 6 Click Score: 6    End of Session Equipment Utilized During Treatment: Gait belt Activity Tolerance: Patient tolerated treatment  well Patient left: in bed;with call bell/phone within reach;with bed alarm set;with family/visitor present   PT Visit Diagnosis: Muscle weakness (generalized) (M62.81);Difficulty in walking, not elsewhere classified (R26.2)    Time: 8127-5170 PT Time Calculation (min) (ACUTE ONLY): 37 min   Charges:   PT Evaluation $PT Eval Moderate Complexity: 1 Mod     PT G Codes:          Weston Anna, MPT Pager: [phone]

## 2018-02-09 NOTE — ED Notes (Signed)
ED TO INPATIENT HANDOFF REPORT  Name/Age/Gender Donna Valentine 77 y.o. female  Code Status Code Status History    Date Active Date Inactive Code Status Order ID Comments User Context   11/28/2016 1452 12/02/2016 2344 Full Code [id number]  Ashok Pall, MD Inpatient   11/20/2016 2317 11/21/2016 2207 Full Code [id number]  Norman Herrlich, MD ED      Home/SNF/Other Home  Chief Complaint Blue Card; UTI  Level of Care/Admitting Diagnosis ED Disposition    ED Disposition Condition [facility] Area: [facility] [[id number]]  Level of Care: Med-Surg [16]  Diagnosis: CAP (community acquired pneumonia) [[id number]]  Admitting Physician: Toy Baker [[id number]]  Attending Physician: Toy Baker [[id number]]  Estimated length of stay: 3 - 4 days  Certification:: I certify this patient will need inpatient services for at least 2 midnights  PT Class (Do Not Modify): Inpatient [101]  PT Acc Code (Do Not Modify): Private [1]       Medical History Past Medical History:  Diagnosis Date  . GBM (glioblastoma multiforme) (HCC)     Allergies Allergies  Allergen Reactions  . Penicillins Other (See Comments)    Hallucination Has patient had a PCN reaction causing immediate rash, facial/tongue/throat swelling, SOB or lightheadedness with hypotension: No Has patient had a PCN reaction causing severe rash involving mucus membranes or skin necrosis: No Has patient had a PCN reaction that required hospitalization No Has patient had a PCN reaction occurring within the last 10 years: No If all of the above answers are "NO", then may proceed with Cephalosporin use.   . Food     Pineapple-Nausea/vomiting    IV Location/Drains/Wounds Patient Lines/Drains/Airways Status   Active Line/Drains/Airways    Name:   Placement date:   Placement time:   Site:   Days:   Peripheral IV 02/08/18 Left Antecubital   02/08/18    1720    Antecubital   1   Incision (Closed)  11/28/16 Head Right   11/28/16    1108     438          Labs/Imaging Results for orders placed or performed during the hospital encounter of 02/08/18 (from the past 48 hour(s))  CBC with Differential     Status: Abnormal   Collection Time: 02/08/18  5:12 PM  Result Value Ref Range   WBC 6.1 4.0 - 10.5 K/uL   RBC 5.11 3.87 - 5.11 MIL/uL   Hemoglobin 15.4 (H) 12.0 - 15.0 g/dL   HCT 47.5 (H) 36.0 - 46.0 %   MCV 93.0 78.0 - 100.0 fL   MCH 30.1 26.0 - 34.0 pg   MCHC 32.4 30.0 - 36.0 g/dL   RDW 14.3 11.5 - 15.5 %   Platelets 139 (L) 150 - 400 K/uL   Neutrophils Relative % 78 %   Neutro Abs 4.8 1.7 - 7.7 K/uL   Lymphocytes Relative 16 %   Lymphs Abs 1.0 0.7 - 4.0 K/uL   Monocytes Relative 6 %   Monocytes Absolute 0.4 0.1 - 1.0 K/uL   Eosinophils Relative 0 %   Eosinophils Absolute 0.0 0.0 - 0.7 K/uL   Basophils Relative 0 %   Basophils Absolute 0.0 0.0 - 0.1 K/uL    Comment: Performed at [facility], [place] [address], [place], [place] [address]  Comprehensive metabolic panel     Status: Abnormal   Collection Time: 02/08/18  5:12 PM  Result Value Ref Range   Sodium 142 135 -  145 mmol/L   Potassium 4.1 3.5 - 5.1 mmol/L   Chloride 107 101 - 111 mmol/L   CO2 25 22 - 32 mmol/L   Glucose, Bld 177 (H) 65 - 99 mg/dL   BUN 21 (H) 6 - 20 mg/dL   Creatinine, Ser 0.74 0.44 - 1.00 mg/dL   Calcium 9.1 8.9 - 10.3 mg/dL   Total Protein 7.0 6.5 - 8.1 g/dL   Albumin 3.3 (L) 3.5 - 5.0 g/dL   AST 22 15 - 41 U/L   ALT 30 14 - 54 U/L   Alkaline Phosphatase 59 38 - 126 U/L   Total Bilirubin 0.8 0.3 - 1.2 mg/dL   GFR calc non Af Amer >60 >60 mL/min   GFR calc Af Amer >60 >60 mL/min    Comment: (NOTE) The eGFR has been calculated using the CKD EPI equation. This calculation has not been validated in all clinical situations. eGFR's persistently <60 mL/min signify possible Chronic Kidney Disease.    Anion gap 10 5 - 15    Comment: Performed at [facility],  [place] [address], [place], [place] [address]  Urinalysis, Routine w reflex microscopic     Status: Abnormal   Collection Time: 02/08/18  5:12 PM  Result Value Ref Range   Color, Urine YELLOW YELLOW   APPearance CLEAR CLEAR   Specific Gravity, Urine 1.012 1.005 - 1.030   pH 5.0 5.0 - 8.0   Glucose, UA 50 (A) NEGATIVE mg/dL   Hgb urine dipstick NEGATIVE NEGATIVE   Bilirubin Urine NEGATIVE NEGATIVE   Ketones, ur NEGATIVE NEGATIVE mg/dL   Protein, ur NEGATIVE NEGATIVE mg/dL   Nitrite NEGATIVE NEGATIVE   Leukocytes, UA NEGATIVE NEGATIVE    Comment: Performed at [facility], [place] [address] [place], [place] [address]  I-Stat CG4 Lactic Acid, ED     Status: Abnormal   Collection Time: 02/08/18  5:23 PM  Result Value Ref Range   Lactic Acid, Venous 2.90 (HH) 0.5 - 1.9 mmol/L   Comment NOTIFIED PHYSICIAN   I-Stat CG4 Lactic Acid, ED     Status: Abnormal   Collection Time: 02/08/18  8:33 PM  Result Value Ref Range   Lactic Acid, Venous 2.68 (HH) 0.5 - 1.9 mmol/L   Comment NOTIFIED PHYSICIAN    Dg Chest 2 View  Result Date: 02/08/2018 CLINICAL DATA:  Altered mental status EXAM: CHEST - 2 VIEW COMPARISON:  11/19/2016 FINDINGS: Low volume chest. Cardiomegaly and aortic tortuosity is stable, accentuated by scoliotic curvature. There is no edema, consolidation, effusion, or pneumothorax. IMPRESSION: 1. Limited low volume chest.  No definite acute finding. 2. Chronic cardiomegaly. Electronically Signed   By: Monte Fantasia M.D.   On: 02/08/2018 17:38   Ct Head Wo Contrast  Result Date: 02/08/2018 CLINICAL DATA:  Altered level of consciousness, unexplained EXAM: CT HEAD WITHOUT CONTRAST TECHNIQUE: Contiguous axial images were obtained from the base of the skull through the vertex without intravenous contrast. COMPARISON:  01/07/2018 FINDINGS: Brain: Low-density and expansion at the level of the treated right frontal GBM. There is low-density throughout the cerebral white matter  and deep gray nuclei, stable. No evidence of acute infarct, hemorrhage, hydrocephalus, or collection. Vascular: No hyperdense vessel or unexpected calcification. Skull: Unremarkable right frontal craniotomy. Sinuses/Orbits: Negative IMPRESSION: Treated right frontal GBM and extensive white matter disease. No acute finding or detected change from most recent staging MRI 01/07/2018. Electronically Signed   By: Monte Fantasia M.D.   On: 02/08/2018 18:21  Ct Abdomen Pelvis W Contrast  Result Date: 02/08/2018 CLINICAL DATA:  Dysuria for 5 weeks.  Patient with brain cancer. EXAM: CT ABDOMEN AND PELVIS WITH CONTRAST TECHNIQUE: Multidetector CT imaging of the abdomen and pelvis was performed using the standard protocol following bolus administration of intravenous contrast. CONTRAST:  16m ISOVUE-300 IOPAMIDOL (ISOVUE-300) INJECTION 61% COMPARISON:  November 20, 2004 FINDINGS: Lower chest: Scattered opacity in the right base and right middle lobe is favored to be atelectasis. Opacity in the left base and lingula could represent atelectasis or infiltrate. There does appear to be some volume loss on the left. Cardiomegaly. 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 Hepatobiliary: No focal liver abnormality is seen. No gallstones, gallbladder wall thickening, or biliary dilatation. Pancreas: Unremarkable. No pancreatic ductal dilatation or surrounding inflammatory changes. Spleen: Normal in size without focal abnormality. Adrenals/Urinary Tract: Left kidney is relatively atrophic versus the right. The kidneys, ureters, and bladder are otherwise normal. Stomach/Bowel: The distal esophagus, stomach, and small bowel are normal. The colon is normal. The patient is status post appendectomy. Vascular/Lymphatic: Mild atherosclerotic change in the nonaneurysmal tortuous aorta. No adenopathy. Reproductive: Uterus and bilateral adnexa are unremarkable. Other: No abdominal wall hernia or abnormality. No abdominopelvic ascites.  Musculoskeletal: Scoliotic curvature of the thoracolumbar spine. IMPRESSION: 1. Scattered opacity in the bases, right middle lobe, and lingula are favored to represent atelectasis. It would be difficult to completely exclude infiltrate on the left. 2. 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 Recommend annual imaging followup by CTA or MRA. This recommendation follows 2010 ACCF/AHA/AATS/ACR/ASA/SCA/SCAI/SIR/STS/SVM Guidelines for the Diagnosis and Management of Patients with Thoracic Aortic Disease. Circulation. 2010; 121: eV425-Z5633. 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 4. Scoliosis. Electronically Signed   By: DDorise BullionIII M.D   On: 02/08/2018 22:03    Pending Labs Unresulted Labs (From admission, onward)   Start     Ordered   02/09/18 0000  Lactic acid, plasma  STAT Now then every 3 hours,   STAT     02/08/18 2305   02/08/18 2306  Procalcitonin  Add-on,   R     02/08/18 2305   02/08/18 1611  Blood culture (routine x 2)  BLOOD CULTURE X 2,   STAT     02/08/18 1610   02/08/18 1610  Urine culture  STAT,   STAT    Question:  Patient immune status  Answer:  Normal   02/08/18 1610   Signed and Held  Culture, sputum-assessment  Once,   R    Question:  Patient immune status  Answer:  Immunocompromised   Signed and Held   Signed and Held  Gram stain  Once,   R    Question:  Patient immune status  Answer:  Immunocompromised   Signed and Held   Signed and Held  Strep pneumoniae urinary antigen  Once,   R     Signed and Held   SVisual merchandiserand Held  Magnesium  Tomorrow morning,   R    Comments:  Call MD if <1.5    Signed and Held   SVisual merchandiserand Held  Phosphorus  Tomorrow morning,   R     Signed and Held   Signed and Held  TSH  Once,   R    Comments:  Cancel if already done within 1 month and notify MD    Signed and Held   Signed and Held  Comprehensive metabolic panel  Once,   R    Comments:  Cal  MD for K<3.5 or >5.0    Signed and Held   Signed and Held  CBC  Once,   R     Comments:  Call for hg <8.0    Signed and Held      Vitals/Pain Today's Vitals   02/09/18 0000 02/09/18 0030 02/09/18 0100 02/09/18 0130  BP: (!) 169/76 (!) 168/75 (!) 165/76 (!) 168/70  Pulse: 62 60 (!) 59 64  Resp: '18 17 15 19  '$ Temp:      TempSrc:      SpO2: 97% 98% 97% 95%  Weight:      Height:      PainSc:        Isolation Precautions No active isolations  Medications Medications  iopamidol (ISOVUE-300) 61 % injection (has no administration in time range)  sodium chloride 0.9 % injection (has no administration in time range)  ceFEPIme (MAXIPIME) 2 g in sodium chloride 0.9 % 100 mL IVPB (0 g Intravenous Stopped 02/08/18 1851)  sodium chloride 0.9 % bolus 500 mL (0 mLs Intravenous Stopped 02/08/18 1834)  sodium chloride 0.9 % bolus 1,000 mL (0 mLs Intravenous Stopped 02/08/18 1929)  0.9 %  sodium chloride infusion ( Intravenous New Bag/Given 02/08/18 2124)  iopamidol (ISOVUE-300) 61 % injection 100 mL (100 mLs Intravenous Contrast Given 02/08/18 2152)    Mobility walks with device

## 2018-02-09 NOTE — Progress Notes (Addendum)
PROGRESS NOTE    Donna Valentine  KDT:[id number] DOB: 16-Feb-1941 DOA: 02/08/2018 PCP: Patient, No Pcp Per   Brief Narrative:  HPI On 02/08/2018 by Dr. Toy Baker Donna Valentine is a 77 y.o. female with medical history significant of right frontalglioblastoma sp ressection XRT, recurrent UTI, hx of DVT, Left side weakness,   Presented with 5 weeks of recurrent dysuria and generalized fatigue in the past has been treated with Cipro for possible UTI she has finished 4 weeks of Cipro together.  She has been getting progressive fatigue and falls decreased appetite and overall progressive decline increasing confusion trouble ambulating moving around the house the oncologist have seen her today and recommended admission for UTI treatment and rehydration. Reports she in general was very fatigued. Family states she is a bit sort of breath. no chest pain.  Last urine culture from 11of April showed Klebsiella pneumonia sensitive to Cipro but resistant to ampicillin and Macrobid  Assessment & Plan   Sepsis secondary to possible Pneumonia vs UTI -Patient presented with tachycardia, tachypnea, elevated lactic acid level -CXR shows no definite acute finding -CT abd/pelvis: Scattered opacity in the bases, right middle lobe, and lingula -Blood cultures no growth to date -UA unremarkable for infection, urine culture pending -pending Strep pneumonia urine Ag -Patient recently treated for Klebsiella pneumonia UTI with ciprofloxacin -Continue ceftriaxone, azithromycin, IV fluids -Continue to monitor lactic acid level  Glioblastoma -CT head showed treated right frontal GBM and extensive white matter disease.  No acute finding or detected change since recent MRI 01/07/2018 -Dr. 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 Pending PT/OT evaluations and improvement.  Consultants Oncology, Dr. Mickeal Skinner  Procedures  None  Antibiotics   Anti-infectives (From admission, onward)   Start     Dose/Rate Route Frequency Ordered Stop   02/09/18 0400  azithromycin (ZITHROMAX) 500 mg in sodium chloride 0.9 % 250 mL IVPB     500 mg 250 mL/hr over 60 Minutes Intravenous Every 24 hours 02/09/18 0227 02/16/18 0359   02/09/18 0245  cefTRIAXone (ROCEPHIN) 1 g in sodium chloride 0.9 % 100 mL IVPB     1 g 200 mL/hr over 30 Minutes Intravenous Daily 02/09/18 0227 02/16/18 0559   02/08/18 1645  ceFEPIme (MAXIPIME) 2 g in sodium chloride 0.9 % 100 mL IVPB     2 g 200 mL/hr over 30 Minutes Intravenous  Once 02/08/18 1630 02/08/18 1851      Subjective:   Donna Valentine seen and examined today.  Patient states she is feeling mildly better.  Still feeling tired.  Denies current chest pain, shortness of breath, abdominal pain, nausea or vomiting, diarrhea or constipation  Objective:   Vitals:   02/09/18 0130 02/09/18 0222 02/09/18 0531 02/09/18 0639  BP: (!) 168/70 (!) 177/78  (!) 158/76  Pulse: 64 (!) 58  60  Resp: 19     Temp:  98.2 F (36.8 C)  (!) 97.4 F (36.3 C)  TempSrc:  Oral  Oral  SpO2: 95% 98%  99%  Weight:   90.3 kg (199 lb)   Height:   5\' 5"  (1.651 m)     Intake/Output Summary (Last 24 hours) at 02/09/2018 1101 Last  data filed at 02/09/2018 1044 Gross per 24 hour  Intake 2190 ml  Output 300 ml  Net 1890 ml   Filed Weights   02/08/18 1507 02/09/18 0531  Weight: 90.3 kg (199 lb) 90.3 kg (199 lb)    Exam  General: Well developed, chronically ill appearing, NAD  HEENT: NCAT, mucous membranes moist.   Neck: Supple  Cardiovascular: S1 S2 auscultated, no rubs, murmurs or gallops. Regular  rate and rhythm.  Respiratory: Clear to auscultation bilaterally with equal chest rise  Abdomen: Soft, nontender, nondistended, + bowel sounds  Extremities: warm dry without cyanosis clubbing. +LE edema B/L   Neuro: AAOx3, nonfocal, slow to respond  Psych: Pleasant, appropriate mood and affect   Data Reviewed: I have personally reviewed following labs and imaging studies  CBC: Recent Labs  Lab 02/04/18 1055 02/08/18 1712 02/09/18 0636  WBC 6.8 6.1 6.2  NEUTROABS  --  4.8  --   HGB 15.1 15.4* 15.6*  HCT 46.2 47.5* 48.1*  MCV 92.4 93.0 92.5  PLT 103* 139* 785*   Basic Metabolic Panel: Recent Labs  Lab 02/04/18 1055 02/08/18 1712 02/09/18 0636  NA 142 142 142  K 3.9 4.1 3.8  CL 109 107 107  CO2 23 25 24   GLUCOSE 157* 177* 102*  BUN 21 21* 15  CREATININE 0.65 0.74 0.51  CALCIUM 8.8 9.1 8.6*  MG  --   --  1.9  PHOS  --   --  2.9   GFR: Estimated Creatinine Clearance: 67.4 mL/min (by C-G formula based on SCr of 0.51 mg/dL). Liver Function Tests: Recent Labs  Lab 02/04/18 1055 02/08/18 1712 02/09/18 0636  AST 15 22 20   ALT 23 30 27   ALKPHOS 60 59 55  BILITOT 0.4 0.8 0.7  PROT 6.3* 7.0 6.2*  ALBUMIN 3.1* 3.3* 3.0*   No results for input(s): LIPASE, AMYLASE in the last 168 hours. No results for input(s): AMMONIA in the last 168 hours. Coagulation Profile: No results for input(s): INR, PROTIME in the last 168 hours. Cardiac Enzymes: No results for input(s): CKTOTAL, CKMB, CKMBINDEX, TROPONINI in the last 168 hours. BNP (last 3 results) No results for input(s): PROBNP in the last 8760 hours. HbA1C: No results for input(s): HGBA1C in the last 72 hours. CBG: No results for input(s): GLUCAP in the last 168 hours. Lipid Profile: No results for input(s): CHOL, HDL, LDLCALC, TRIG, CHOLHDL, LDLDIRECT in the last 72 hours. Thyroid Function Tests: Recent Labs    02/09/18 0636  TSH 5.137*   Anemia Panel: No results for input(s): VITAMINB12, FOLATE,  FERRITIN, TIBC, IRON, RETICCTPCT in the last 72 hours. Urine analysis:    Component Value Date/Time   COLORURINE YELLOW 02/08/2018 1712   APPEARANCEUR CLEAR 02/08/2018 1712   LABSPEC 1.012 02/08/2018 1712   LABSPEC 1.020 04/28/2017 0909   PHURINE 5.0 02/08/2018 1712   GLUCOSEU 50 (A) 02/08/2018 1712   GLUCOSEU Negative 04/28/2017 0909   HGBUR NEGATIVE 02/08/2018 1712   BILIRUBINUR NEGATIVE 02/08/2018 1712   BILIRUBINUR Negative 04/28/2017 0909   KETONESUR NEGATIVE 02/08/2018 1712   PROTEINUR NEGATIVE 02/08/2018 1712   UROBILINOGEN 0.2 04/28/2017 0909   NITRITE NEGATIVE 02/08/2018 1712   LEUKOCYTESUR NEGATIVE 02/08/2018 1712   LEUKOCYTESUR Negative 04/28/2017 0909   Sepsis Labs: @LABRCNTIP (procalcitonin:4,lacticidven:4)  ) Recent Results (from the past 240 hour(s))  Blood culture (routine x 2)     Status: None (Preliminary result)   Collection Time: 02/08/18  4:16 PM  Result Value Ref Range Status   Specimen  Description   Final    BLOOD RIGHT ANTECUBITAL Performed at [facility] [address] [place], [place] [address]    Special Requests   Final    BOTTLES DRAWN AEROBIC ONLY Blood Culture results may not be optimal due to an inadequate volume of blood received in culture bottles Performed at [facility] [address] [place], [place] [address]    Culture   Final    NO GROWTH < 24 HOURS Performed at [facility] [address], [place], [place] [address]    Report Status PENDING  Incomplete  Blood culture (routine x 2)     Status: None (Preliminary result)   Collection Time: 02/08/18  5:12 PM  Result Value Ref Range Status   Specimen Description   Final    BLOOD BLOOD LEFT FOREARM Performed at [facility] [address] [place], [place] [address]    Special Requests   Final    BOTTLES DRAWN AEROBIC AND ANAEROBIC Blood Culture adequate volume Performed at [facility]  [address] [place], [place] [address]    Culture   Final    NO GROWTH < 24 HOURS Performed at [facility] [address] [place], [place] [address]    Report Status PENDING  Incomplete      Radiology Studies: Dg Chest 2 View  Result Date: 02/08/2018 CLINICAL DATA:  Altered mental status EXAM: CHEST - 2 VIEW COMPARISON:  11/19/2016 FINDINGS: Low volume chest. Cardiomegaly and aortic tortuosity is stable, accentuated by scoliotic curvature. There is no edema, consolidation, effusion, or pneumothorax. IMPRESSION: 1. Limited low volume chest.  No definite acute finding. 2. Chronic cardiomegaly. Electronically Signed   By: Monte Fantasia M.D.   On: 02/08/2018 17:38   Ct Head Wo Contrast  Result Date: 02/08/2018 CLINICAL DATA:  Altered level of consciousness, unexplained EXAM: CT HEAD WITHOUT CONTRAST TECHNIQUE: Contiguous axial images were obtained from the base of the skull through the vertex without intravenous contrast. COMPARISON:  01/07/2018 FINDINGS: Brain: Low-density and expansion at the level of the treated right frontal GBM. There is low-density throughout the cerebral white matter and deep gray nuclei, stable. No evidence of acute infarct, hemorrhage, hydrocephalus, or collection. Vascular: No hyperdense vessel or unexpected calcification. Skull: Unremarkable right frontal craniotomy. Sinuses/Orbits: Negative IMPRESSION: Treated right frontal GBM and extensive white matter disease. No acute finding or detected change from most recent staging MRI 01/07/2018. Electronically Signed   By: Monte Fantasia M.D.   On: 02/08/2018 18:21   Ct Abdomen Pelvis W Contrast  Result Date: 02/08/2018 CLINICAL DATA:  Dysuria for 5 weeks.  Patient with brain cancer. EXAM: CT ABDOMEN AND PELVIS WITH CONTRAST TECHNIQUE: Multidetector CT imaging of the abdomen and pelvis was performed using the standard protocol following bolus administration of intravenous contrast. CONTRAST:  163mL ISOVUE-300 IOPAMIDOL  (ISOVUE-300) INJECTION 61% COMPARISON:  November 20, 2004 FINDINGS: Lower chest: Scattered opacity in the right base and right middle lobe is favored to be atelectasis. Opacity in the left base and lingula could represent atelectasis or infiltrate. There does appear to be some volume loss on the left. Cardiomegaly. 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 Hepatobiliary: No focal liver abnormality is seen. No gallstones, gallbladder wall thickening, or biliary dilatation. Pancreas: Unremarkable. No pancreatic ductal dilatation or surrounding inflammatory changes. Spleen: Normal in size without focal abnormality. Adrenals/Urinary Tract: Left kidney is relatively atrophic versus the right. The kidneys, ureters, and bladder  are otherwise normal. Stomach/Bowel: The distal esophagus, stomach, and small bowel are normal. The colon is normal. The patient is status post appendectomy. Vascular/Lymphatic: Mild atherosclerotic change in the nonaneurysmal tortuous aorta. No adenopathy. Reproductive: Uterus and bilateral adnexa are unremarkable. Other: No abdominal wall hernia or abnormality. No abdominopelvic ascites. Musculoskeletal: Scoliotic curvature of the thoracolumbar spine. IMPRESSION: 1. Scattered opacity in the bases, right middle lobe, and lingula are favored to represent atelectasis. It would be difficult to completely exclude infiltrate on the left. 2. 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 Recommend annual imaging followup by CTA or MRA. This recommendation follows 2010 ACCF/AHA/AATS/ACR/ASA/SCA/SCAI/SIR/STS/SVM Guidelines for the Diagnosis and Management of Patients with Thoracic Aortic Disease. Circulation. 2010; 121: G295-M841 3. 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 4. Scoliosis. Electronically Signed   By: Dorise Bullion III M.D   On: 02/08/2018 22:03     Scheduled Meds: . apixaban  5 mg Oral BID  . dexamethasone  2 mg Oral Q breakfast  . levETIRAcetam  500 mg Oral BID  .  primidone  50 mg Oral BID  . senna  1 tablet Oral Daily   Continuous Infusions: . azithromycin Stopped (02/09/18 0701)  . cefTRIAXone (ROCEPHIN)  IV Stopped (02/09/18 0446)     LOS: 1 day   Time Spent in minutes   45 minutes (greater than 50% of time spent with patient face to face, as well as reviewing old records, calling consults, and formulating a plan)  Georga Stys D.O. on 02/09/2018 at 11:01 AM  Between 7am to 7pm - Pager - [phone]  After 7pm go to [email or web address] - password [email or web address]  And look for the night coverage person covering for me after hours  [facility] Office  [phone]

## 2018-02-09 NOTE — Plan of Care (Signed)
initiated

## 2018-02-09 NOTE — Consult Note (Signed)
[facility] CONSULT NOTE  Patient Care Team: Patient, No Pcp Per as PCP - General (General Practice)  CHIEF COMPLAINTS/PURPOSE OF CONSULTATION:  Glioblastoma Generalized weakness  HISTORY OF PRESENTING ILLNESS:  Donna Valentine 77 y.o. female presented with several days of malaise, non-specific weakness, along with symptoms of urinary urgency/burning with foul smelling urine despite active rx with antibiotics for UTI.  She was started on IV antibiotics here but she does not feel significant improvement today.  Recently had small bowel movement which was first BM in ~5 days.  Continues to feel "weak all over".  For more details see clinic note from this week.    MEDICAL HISTORY:  Past Medical History:  Diagnosis Date  . 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 APPENDECTOMY  1994  . APPLICATION OF CRANIAL NAVIGATION N/A 11/28/2016   Procedure: APPLICATION OF CRANIAL NAVIGATION;  Surgeon: Ashok Pall, MD;  Location: [facility];  Service: Neurosurgery;  Laterality: N/A;  . CESAREAN SECTION     x3  . CRANIOTOMY Right 11/28/2016   Procedure: STERIOTACTIC PARIETAL CRANIOTOMY FOR RIGHT FRONTAL TUMOR RESECTION WITH BRAINLAB;  Surgeon: Ashok Pall, MD;  Location: [place];  Service: Neurosurgery;  Laterality: Right;    SOCIAL HISTORY: Social History   Socioeconomic History  . Marital status: Married    Spouse name: Not on file  . Number of children: Not on file  . Years of education: Not on file  . Highest education level: Not on file  Occupational History  . Not on file  Social Needs  . Financial resource strain: Not on file  . Food insecurity:    Worry: Not on file    Inability: Not on file  . Transportation needs:    Medical: Not on file    Non-medical: Not on file  Tobacco Use  . Smoking status: Former Smoker    Last attempt to quit: 11/26/1991    Years since quitting: 26.2  .  Smokeless tobacco: Never Used  Substance and Sexual Activity  . Alcohol use: No  . Drug use: No  . Sexual activity: Not Currently  Lifestyle  . Physical activity:    Days per week: Not on file    Minutes per session: Not on file  . Stress: Not on file  Relationships  . Social connections:    Talks on phone: Not on file    Gets together: Not on file    Attends religious service: Not on file    Active member of club or organization: Not on file    Attends meetings of clubs or organizations: Not on file    Relationship status: Not on file  . Intimate partner violence:    Fear of current or ex partner: Not on file    Emotionally abused: Not on file    Physically abused: Not on file    Forced sexual activity: Not on file  Other Topics Concern  . Not on file  Social History Narrative  . Not on file    FAMILY HISTORY: Family History  Problem Relation Age of Onset  . Heart disease Mother   . Alzheimer's disease Brother   . Cancer Neg Hx     ALLERGIES:  is allergic to penicillins and food.  MEDICATIONS:  Current Facility-Administered Medications  Medication Dose Route Frequency Provider Last Rate Last Dose  . acetaminophen (TYLENOL) tablet 650 mg  650 mg Oral Q6H  PRN Toy Baker, MD   650 mg at 02/09/18 0346   Or  . acetaminophen (TYLENOL) suppository 650 mg  650 mg Rectal Q6H PRN Toy Baker, MD      . apixaban (ELIQUIS) tablet 5 mg  5 mg Oral BID Doutova, Anastassia, MD   5 mg at 02/09/18 0950  . azithromycin (ZITHROMAX) 500 mg in sodium chloride 0.9 % 250 mL IVPB  500 mg Intravenous Q24H Toy Baker, MD   Stopped at 02/09/18 0701  . cefTRIAXone (ROCEPHIN) 1 g in sodium chloride 0.9 % 100 mL IVPB  1 g Intravenous Q0600 Toy Baker, MD   Stopped at 02/09/18 0446  . dexamethasone (DECADRON) tablet 2 mg  2 mg Oral Q breakfast Doutova, Anastassia, MD   2 mg at 02/09/18 0815  . HYDROcodone-acetaminophen (NORCO/VICODIN) 5-325 MG per tablet 1-2  tablet  1-2 tablet Oral Q4H PRN Toy Baker, MD      . levETIRAcetam (KEPPRA) tablet 500 mg  500 mg Oral BID Toy Baker, MD   500 mg at 02/09/18 0950  . ondansetron (ZOFRAN) tablet 4 mg  4 mg Oral Q6H PRN Toy Baker, MD       Or  . ondansetron (ZOFRAN) injection 4 mg  4 mg Intravenous Q6H PRN Doutova, Anastassia, MD      . primidone (MYSOLINE) tablet 50 mg  50 mg Oral BID Toy Baker, MD   50 mg at 02/09/18 0950  . senna (SENOKOT) tablet 8.6 mg  1 tablet Oral Daily Toy Baker, MD   8.6 mg at 02/09/18 0950    REVIEW OF SYSTEMS:   Constitutional: Denies fevers, chills or abnormal weight loss Eyes: Denies blurriness of vision Ears, nose, mouth, throat, and face: Denies mucositis or sore throat Respiratory: Denies cough, dyspnea or wheezes Cardiovascular: Denies palpitation, chest discomfort or lower extremity swelling Gastrointestinal:  Denies nausea, constipation, diarrhea GU: Denies dysuria or incontinence Skin: Denies abnormal skin rashes Neurological: Per HPI Musculoskeletal: Denies joint pain, back or neck discomfort. No decrease in ROM Behavioral/Psych: Denies anxiety, disturbance in thought content, and mood instability   PHYSICAL EXAMINATION: Vitals:   02/09/18 0222 02/09/18 0639  BP: (!) 177/78 (!) 158/76  Pulse: (!) 58 60  Resp:    Temp: 98.2 F (36.8 C) (!) 97.4 F (36.3 C)  SpO2: 98% 99%   KPS: 60. General: Alert, cooperative, pleasant, in no acute distress Head: Craniotomy scar noted, dry and intact. EENT: No conjunctival injection or scleral icterus. Oral mucosa moist Lungs: Resp effort normal Cardiac: Regular rate and rhythm Abdomen: Soft, non-distended abdomen Skin: No rashes cyanosis or petechiae. Extremities: No clubbing or edema  Neurologic Exam: Mental Status: Awake, alert, attentive to examiner. Oriented to self and environment. Language is fluent with intact comprehension.  Some visual spatial neglect on  right.  Cranial Nerves: Visual acuity is grossly normal. Visual fields are full. Extra-ocular movements intact. No ptosis. Face is symmetric, tongue midline. Motor: Tone and bulk are normal. 3/5 in left arm, 3/5 in left leg. Noted coarse asterixis and easily inducible clonus in arm L>R mimicking tremor. Noted proximal weakness in bilateral legs to hip flexion. Reflexes are symmetric, no pathologic reflexes present. 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  Sensory: Intact to light touch and temperature Gait: Deferred  LABORATORY DATA:  I have reviewed the data as listed Lab Results  Component Value Date   WBC 6.2 02/09/2018   HGB 15.6 (H) 02/09/2018   HCT 48.1 (H) 02/09/2018   MCV 92.5 02/09/2018   PLT 125 (  L) 02/09/2018   Recent Labs    02/04/18 1055 02/08/18 1712 02/09/18 0636  NA 142 142 142  K 3.9 4.1 3.8  CL 109 107 107  CO2 23 25 24   GLUCOSE 157* 177* 102*  BUN 21 21* 15  CREATININE 0.65 0.74 0.51  CALCIUM 8.8 9.1 8.6*  GFRNONAA >60 >60 >60  GFRAA >60 >60 >60  PROT 6.3* 7.0 6.2*  ALBUMIN 3.1* 3.3* 3.0*  AST 15 22 20   ALT 23 30 27   ALKPHOS 60 59 55  BILITOT 0.4 0.8 0.7    RADIOGRAPHIC STUDIES: I have personally reviewed the radiological images as listed and agreed with the findings in the report. Dg Chest 2 View  Result Date: 02/08/2018 CLINICAL DATA:  Altered mental status EXAM: CHEST - 2 VIEW COMPARISON:  11/19/2016 FINDINGS: Low volume chest. Cardiomegaly and aortic tortuosity is stable, accentuated by scoliotic curvature. There is no edema, consolidation, effusion, or pneumothorax. IMPRESSION: 1. Limited low volume chest.  No definite acute finding. 2. Chronic cardiomegaly. Electronically Signed   By: Monte Fantasia M.D.   On: 02/08/2018 17:38   Ct Head Wo Contrast  Result Date: 02/08/2018 CLINICAL DATA:  Altered level of consciousness, unexplained EXAM: CT HEAD WITHOUT CONTRAST TECHNIQUE: Contiguous axial images were obtained from the base of the skull through the  vertex without intravenous contrast. COMPARISON:  01/07/2018 FINDINGS: Brain: Low-density and expansion at the level of the treated right frontal GBM. There is low-density throughout the cerebral white matter and deep gray nuclei, stable. No evidence of acute infarct, hemorrhage, hydrocephalus, or collection. Vascular: No hyperdense vessel or unexpected calcification. Skull: Unremarkable right frontal craniotomy. Sinuses/Orbits: Negative IMPRESSION: Treated right frontal GBM and extensive white matter disease. No acute finding or detected change from most recent staging MRI 01/07/2018. Electronically Signed   By: Monte Fantasia M.D.   On: 02/08/2018 18:21   Ct Abdomen Pelvis W Contrast  Result Date: 02/08/2018 CLINICAL DATA:  Dysuria for 5 weeks.  Patient with brain cancer. EXAM: CT ABDOMEN AND PELVIS WITH CONTRAST TECHNIQUE: Multidetector CT imaging of the abdomen and pelvis was performed using the standard protocol following bolus administration of intravenous contrast. CONTRAST:  154mL ISOVUE-300 IOPAMIDOL (ISOVUE-300) INJECTION 61% COMPARISON:  November 20, 2004 FINDINGS: Lower chest: Scattered opacity in the right base and right middle lobe is favored to be atelectasis. Opacity in the left base and lingula could represent atelectasis or infiltrate. There does appear to be some volume loss on the left. Cardiomegaly. 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 Hepatobiliary: No focal liver abnormality is seen. No gallstones, gallbladder wall thickening, or biliary dilatation. Pancreas: Unremarkable. No pancreatic ductal dilatation or surrounding inflammatory changes. Spleen: Normal in size without focal abnormality. Adrenals/Urinary Tract: Left kidney is relatively atrophic versus the right. The kidneys, ureters, and bladder are otherwise normal. Stomach/Bowel: The distal esophagus, stomach, and small bowel are normal. The colon is normal. The patient is status post appendectomy. Vascular/Lymphatic: Mild  atherosclerotic change in the nonaneurysmal tortuous aorta. No adenopathy. Reproductive: Uterus and bilateral adnexa are unremarkable. Other: No abdominal wall hernia or abnormality. No abdominopelvic ascites. Musculoskeletal: Scoliotic curvature of the thoracolumbar spine. IMPRESSION: 1. Scattered opacity in the bases, right middle lobe, and lingula are favored to represent atelectasis. It would be difficult to completely exclude infiltrate on the left. 2. 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 Recommend annual imaging followup by CTA or MRA. This recommendation follows 2010 ACCF/AHA/AATS/ACR/ASA/SCA/SCAI/SIR/STS/SVM Guidelines for the Diagnosis and Management of Patients with Thoracic Aortic Disease.  Circulation. 2010; 121: D176-H607 3. 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 4. Scoliosis. Electronically Signed   By: Dorise Bullion III M.D   On: 02/08/2018 22:03    ASSESSMENT & PLAN:  Glioblastoma Generalized Weakness Suspected complicated UTI  Ms Valentine is stable today but not at her baseline.  We are still waiting results of urine culture, and rx will continue to cover for possibility of underlying CAP based on chest CT.    Still suspect role for infection given clinical history, but consideration also given to possibility of adrenal insufficiency 2/2 steroids.  If still not improved by tomorrow will likely recommend addition of Hydrocortisone to support adrenal function as she continues to stabilize or wean dexamethasone.  T/c more aggressive bowel regimen.  Will follow.  All questions were answered. The patient knows to call the clinic with any problems, questions or concerns.  The total time spent in the encounter was 40 minutes and more than 50% was on counseling and review of test results     Ventura Sellers, MD 02/09/2018 4:45 PM

## 2018-02-09 NOTE — Evaluation (Signed)
Occupational Therapy Evaluation Patient Details Name: Donna Valentine MRN: [id number] DOB: [birth year] Today's Date: 02/09/2018    History of Present Illness 77 y.o. 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 Pt admitted with worsening weakness, Pna, UTI.    Clinical Impression   Pt admitted with above illness. Pt currently with functional limitations due to the deficits listed below (see OT Problem List).  Pt will benefit from skilled OT to increase their safety and independence with ADL and functional mobility for ADL to facilitate discharge to venue listed below.      Follow Up Recommendations  SNF -    Equipment Recommendations  None recommended by OT    Recommendations for Other Services       Precautions / Restrictions Precautions Precautions: Fall Restrictions Weight Bearing Restrictions: No      Mobility Bed Mobility Overal bed mobility: Needs Assistance Bed Mobility: Supine to Sit;Sit to Supine     Supine to sit: +2 for safety/equipment;+2 for physical assistance;Max assist Sit to supine: +2 for safety/equipment;+2 for physical assistance;Max assist      Transfers Overall transfer level: Needs assistance   Transfers: Sit to/from Stand Sit to Stand: +2 safety/equipment;+2 physical assistance;Max assist              Balance Overall balance assessment: Needs assistance Sitting-balance support: Single extremity supported Sitting balance-Leahy Scale: Fair     Standing balance support: Bilateral upper extremity supported Standing balance-Leahy Scale: Poor                             ADL either performed or assessed with clinical judgement   ADL Overall ADL's : Needs assistance/impaired Eating/Feeding: Moderate assistance;Sitting   Grooming: Maximal assistance;Sitting;Brushing hair                                      Vision Patient Visual Report: No change from baseline              Pertinent Vitals/Pain Pain Assessment: No/denies pain     Hand Dominance     Extremity/Trunk Assessment Upper Extremity Assessment Upper Extremity Assessment: LUE deficits/detail LUE Deficits / Details: limited movement against gravity LUE Coordination: decreased gross motor;decreased fine motor           Communication     Cognition Arousal/Alertness: Awake/alert Behavior During Therapy: WFL for tasks assessed/performed Overall Cognitive Status: Within Functional Limits for tasks assessed                                 General Comments: slow to process at times but always followed commands   General Comments               Home Living Family/patient expects to be discharged to:: Private residence Living Arrangements: Other relatives;Spouse/significant other;Children Available Help at Discharge: Family Type of Home: House Home Access: Stairs to enter [employer] of Steps: 2- bump up stairs in [place]: One level     Bathroom Shower/Tub: Tub/shower unit;Tub only   Biochemist, clinical: Handicapped height     Home Equipment: Other (comment);Walker - 2 wheels;Bedside commode(lift chair)          Prior Functioning/Environment Level of Independence: Needs assistance  OT Problem List: Decreased strength;Decreased activity tolerance;Impaired balance (sitting and/or standing);Decreased range of motion;Decreased safety awareness;Decreased knowledge of precautions;Decreased knowledge of use of DME or AE;Impaired UE functional use;Obesity      OT Treatment/Interventions: Self-care/ADL training;Balance training;DME and/or AE instruction;Therapeutic exercise;Patient/family education;Therapeutic activities;Neuromuscular education    OT Goals(Current goals can be found in the care plan section) Acute Rehab OT Goals Patient Stated Goal: be able  to go homen and only one person A pt OT Goal Formulation: With patient/family Time For Goal Achievement: 02/17/18 ADL Goals Pt Will Perform Eating: with supervision;sitting Pt Will Perform Grooming: with supervision;sitting Pt Will Transfer to Toilet: with min assist;bedside commode Pt Will Perform Toileting - Clothing Manipulation and hygiene: with min assist;sitting/lateral leans;sit to/from stand  OT Frequency: Min 2X/week   Barriers to D/C:            Co-evaluation PT/OT/SLP Co-Evaluation/Treatment: Yes Reason for Co-Treatment: Complexity of the patient's impairments (multi-system involvement);For patient/therapist safety   OT goals addressed during session: ADL's and self-care      AM-PAC PT "6 Clicks" Daily Activity     Outcome Measure Help from another person eating meals?: A Lot Help from another person taking care of personal grooming?: A Lot Help from another person toileting, which includes using toliet, bedpan, or urinal?: Total Help from another person bathing (including washing, rinsing, drying)?: A Lot Help from another person to put on and taking off regular upper body clothing?: A Lot Help from another person to put on and taking off regular lower body clothing?: Total 6 Click Score: 10   End of Session Equipment Utilized During Treatment: Rolling walker;Gait belt Nurse Communication: Mobility status;Need for lift equipment  Activity Tolerance: Patient tolerated treatment well Patient left: in bed;with call bell/phone within reach  OT Visit Diagnosis: Unsteadiness on feet (R26.81);Repeated falls (R29.6);History of falling (Z91.81);Other abnormalities of gait and mobility (R26.89);Muscle weakness (generalized) (M62.81);Hemiplegia and hemiparesis                Time: 0511-0211 OT Time Calculation (min): 36 min Charges:  OT General Charges $OT Visit: 1 Visit OT Evaluation $OT Eval Moderate Complexity: 1 Mod OT Treatments $Self Care/Home Management : 8-22  mins G-Codes:     Kari Baars, [place]  Payton Mccallum D 02/09/2018, 1:31 PM

## 2018-02-09 NOTE — Progress Notes (Signed)
[facility]  Patient Status: Active (receiving services up to time of hospitalization)  [facility] is providing the following services: PT, OT and HHA  If patient discharges after hours, please call [phone].   Donna Valentine 02/09/2018, 9:20 AM

## 2018-02-09 NOTE — Progress Notes (Signed)
Report received from [facility] RN/ED Pt received to room 1308 via stretcher and transferred to bed with assist without difficulty. Call light safety initiated. Pt verbalized understanding

## 2018-02-10 LAB — BASIC METABOLIC PANEL
Anion gap: 8 (ref 5–15)
BUN: 15 mg/dL (ref 6–20)
CHLORIDE: 113 mmol/L — AB (ref 101–111)
CO2: 24 mmol/L (ref 22–32)
CREATININE: 0.45 mg/dL (ref 0.44–1.00)
Calcium: 8.3 mg/dL — ABNORMAL LOW (ref 8.9–10.3)
GFR calc non Af Amer: >60 mL/min (ref >60)
Glucose, Bld: 105 mg/dL — ABNORMAL HIGH (ref 65–99)
POTASSIUM: 3.6 mmol/L (ref 3.5–5.1)
Sodium: 145 mmol/L (ref 135–145)

## 2018-02-10 LAB — CBC
HEMATOCRIT: 40.6 % (ref 36.0–46.0)
Hemoglobin: 13 g/dL (ref 12.0–15.0)
MCH: 29.5 pg (ref 26.0–34.0)
MCHC: 32 g/dL (ref 30.0–36.0)
MCV: 92.3 fL (ref 78.0–100.0)
PLATELETS: 106 10*3/uL — AB (ref 150–400)
RBC: 4.4 MIL/uL (ref 3.87–5.11)
RDW: 14.4 % (ref 11.5–15.5)
WBC: 4.8 10*3/uL (ref 4.0–10.5)

## 2018-02-10 LAB — STREP PNEUMONIAE URINARY ANTIGEN: Strep Pneumo Urinary Antigen: NEGATIVE

## 2018-02-10 LAB — LACTIC ACID, PLASMA: Lactic Acid, Venous: 1.1 mmol/L (ref 0.5–1.9)

## 2018-02-10 LAB — URINE CULTURE
Culture: NO GROWTH
Special Requests: NORMAL

## 2018-02-10 MED ORDER — POLYETHYLENE GLYCOL 3350 17 G PO PACK
17.0000 g | PACK | Freq: Every day | ORAL | Status: DC
  Administered 2018-02-10 – 2018-02-12 (×2): 17 g via ORAL
  Filled 2018-02-10 (×3): qty 1

## 2018-02-10 NOTE — Progress Notes (Addendum)
PROGRESS NOTE    Donna Valentine  NFA:[id number] DOB: 05/18/1941 DOA: 02/08/2018 PCP: Patient, No Pcp Per   Brief Narrative:  HPI On 02/08/2018 by Dr. Toy Baker Donna Valentine is a 77 y.o. female with medical history significant of right frontalglioblastoma sp ressection XRT, recurrent UTI, hx of DVT, Left side weakness,   Presented with 5 weeks of recurrent dysuria and generalized fatigue in the past has been treated with Cipro for possible UTI she has finished 4 weeks of Cipro together.  She has been getting progressive fatigue and falls decreased appetite and overall progressive decline increasing confusion trouble ambulating moving around the house the oncologist have seen her today and recommended admission for UTI treatment and rehydration. Reports she in general was very fatigued. Family states she is a bit sort of breath. no chest pain.  Last urine culture from 11of April showed Klebsiella pneumonia sensitive to Cipro but resistant to ampicillin and Macrobid  Interim history Admitted for sepsis secondary to pneumonia.   Assessment & Plan   Sepsis secondary to possible Pneumonia vs UTI -Patient presented with tachycardia, tachypnea, elevated lactic acid level -vital signs and lactic acid have normalized -CXR shows no definite acute finding -CT abd/pelvis: Scattered opacity in the bases, right middle lobe, and lingula -Blood cultures no growth to date -UA unremarkable for infection, urine culture showed no growth -Strep pneumonia urine Ag negative  -Patient recently treated for Klebsiella pneumonia UTI with ciprofloxacin -Continue ceftriaxone, azithromycin, IV fluids  Glioblastoma -CT head showed treated right frontal GBM and extensive white matter disease.  No acute finding or detected change since recent MRI 01/07/2018 -Dr. Mickeal Skinner, made aware of patient's admission -Continue keppra, decadron -Discussed with Dr. Mickeal Skinner, may want to start hydrocortisone to aid  in weaning decadron, however will hold off for now.  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 Dispo SNF vs Home with home health, when patient improves  Consultants Oncology, Dr. Mickeal Skinner  Procedures  None  Antibiotics   Anti-infectives (From admission, onward)   Start     Dose/Rate Route Frequency Ordered Stop   02/09/18 0400  azithromycin (ZITHROMAX) 500 mg in sodium chloride 0.9 % 250 mL IVPB     500 mg 250 mL/hr over 60 Minutes Intravenous Every 24 hours 02/09/18 0227 02/16/18 0359   02/09/18 0245  cefTRIAXone (ROCEPHIN) 1 g in sodium chloride 0.9 % 100 mL IVPB     1 g 200 mL/hr over 30 Minutes Intravenous Daily 02/09/18 0227 02/16/18 0559   02/08/18 1645  ceFEPIme (MAXIPIME) 2 g in sodium chloride 0.9 % 100 mL IVPB     2 g 200 mL/hr over 30 Minutes Intravenous  Once 02/08/18 1630 02/08/18 1851      Subjective:   Donna Valentine seen and examined today.  Patient feeling better this morning. Denies chest pain, shortness of breath, abdominal pain, nausea, vomiting, diarrhea, constipation, dizziness, headache. Patient had 2 bowel movements yesterday.  Objective:   Vitals:   02/09/18  1754 02/09/18 2155 02/10/18 0032 02/10/18 0520  BP: (!) 152/74 (!) 144/74 (!) 150/79 (!) 145/75  Pulse: 65 75 67 79  Resp: 18 19 18 16   Temp: 97.6 F (36.4 C) 97.9 F (36.6 C) 98.7 F (37.1 C) 98.1 F (36.7 C)    TempSrc: Oral Oral Oral Oral  SpO2: 100% 99% 96% 94%  Weight:      Height:        Intake/Output Summary (Last 24 hours) at 02/10/2018 1253 Last data filed at 02/10/2018 1000 Gross per 24 hour  Intake 921 ml  Output 800 ml  Net 121 ml   Filed Weights   02/08/18 1507 02/09/18 0531  Weight: 90.3 kg (199 lb) 90.3 kg (199 lb)   Exam  General: Well developed, chronically ill appearing, NAD  HEENT: NCAT, mucous membranes moist.   Neck: Supple  Cardiovascular: S1 S2 auscultated, RRR  Respiratory: Clear to auscultation bilaterally with equal chest rise  Abdomen: Soft, obese, nontender, nondistended, + bowel sounds  Extremities: warm dry without cyanosis clubbing. LE edema B/L   Neuro: AAOx3, nonfocal  Psych: Appropriate mood and pleasant, pleasant  Data Reviewed: I have personally reviewed following labs and imaging studies  CBC: Recent Labs  Lab 02/04/18 1055 02/08/18 1712 02/09/18 0636 02/10/18 0559  WBC 6.8 6.1 6.2 4.8  NEUTROABS  --  4.8  --   --   HGB 15.1 15.4* 15.6* 13.0  HCT 46.2 47.5* 48.1* 40.6  MCV 92.4 93.0 92.5 92.3  PLT 103* 139* 125* 295*   Basic Metabolic Panel: Recent Labs  Lab 02/04/18 1055 02/08/18 1712 02/09/18 0636 02/10/18 0559  NA 142 142 142 145  K 3.9 4.1 3.8 3.6  CL 109 107 107 113*  CO2 23 25 24 24   GLUCOSE 157* 177* 102* 105*  BUN 21 21* 15 15  CREATININE 0.65 0.74 0.51 0.45  CALCIUM 8.8 9.1 8.6* 8.3*  MG  --   --  1.9  --   PHOS  --   --  2.9  --    GFR: Estimated Creatinine Clearance: 67.4 mL/min (by C-G formula based on SCr of 0.45 mg/dL). Liver Function Tests: Recent Labs  Lab 02/04/18 1055 02/08/18 1712 02/09/18 0636  AST 15 22 20   ALT 23 30 27   ALKPHOS 60 59 55  BILITOT 0.4 0.8 0.7  PROT 6.3* 7.0 6.2*  ALBUMIN 3.1* 3.3* 3.0*   No results for input(s): LIPASE, AMYLASE in the last 168 hours. No results for input(s): AMMONIA in the last 168 hours. Coagulation Profile: No results for input(s): INR, PROTIME in  the last 168 hours. Cardiac Enzymes: No results for input(s): CKTOTAL, CKMB, CKMBINDEX, TROPONINI in the last 168 hours. BNP (last 3 results) No results for input(s): PROBNP in the last 8760 hours. HbA1C: No results for input(s): HGBA1C in the last 72 hours. CBG: No results for input(s): GLUCAP in the last 168 hours. Lipid Profile: No results for input(s): CHOL, HDL, LDLCALC, TRIG, CHOLHDL, LDLDIRECT in the last 72 hours. Thyroid Function Tests: Recent Labs    02/09/18 0636  TSH 5.137*  FREET4 1.14   Anemia Panel: No results for input(s): VITAMINB12, FOLATE, FERRITIN, TIBC, IRON, RETICCTPCT in the last 72 hours. Urine analysis:    Component Value Date/Time   COLORURINE YELLOW 02/08/2018 1712   APPEARANCEUR CLEAR 02/08/2018 1712   LABSPEC 1.012 02/08/2018 1712   LABSPEC 1.020 04/28/2017 0909   PHURINE 5.0 02/08/2018 1712   GLUCOSEU 50 (A) 02/08/2018 1712   GLUCOSEU Negative  04/28/2017 0909   HGBUR NEGATIVE 02/08/2018 1712   BILIRUBINUR NEGATIVE 02/08/2018 1712   BILIRUBINUR Negative 04/28/2017 0909   KETONESUR NEGATIVE 02/08/2018 1712   PROTEINUR NEGATIVE 02/08/2018 1712   UROBILINOGEN 0.2 04/28/2017 0909   NITRITE NEGATIVE 02/08/2018 1712   LEUKOCYTESUR NEGATIVE 02/08/2018 1712   LEUKOCYTESUR Negative 04/28/2017 0909   Sepsis Labs: @LABRCNTIP (procalcitonin:4,lacticidven:4)  ) Recent Results (from the past 240 hour(s))  Blood culture (routine x 2)     Status: None (Preliminary result)   Collection Time: 02/08/18  4:16 PM  Result Value Ref Range Status   Specimen Description   Final    BLOOD RIGHT ANTECUBITAL Performed at [facility], [place] [address] [place], [place] [address]    Special Requests   Final    BOTTLES DRAWN AEROBIC ONLY Blood Culture results may not be optimal due to an inadequate volume of blood received in culture bottles Performed at [facility] [address] [place], [place] [address]    Culture   Final     NO GROWTH 2 DAYS Performed at [place] [address], [place], [place] [address]    Report Status PENDING  Incomplete  Urine culture     Status: None   Collection Time: 02/08/18  5:12 PM  Result Value Ref Range Status   Specimen Description   Final    URINE, CLEAN CATCH Performed at [facility], [place], [place] [address] [place], [place] [address]    Special Requests   Final    Normal Performed at [facility], [place] [address] [place], [place] [address]    Culture   Final    NO GROWTH Performed at [facility], [place] [address], [place], [place] [address]    Report Status 02/10/2018 FINAL  Final  Blood culture (routine x 2)     Status: None (Preliminary result)   Collection Time: 02/08/18  5:12 PM  Result Value Ref Range Status   Specimen Description   Final    BLOOD BLOOD LEFT FOREARM Performed at [facility] [address] [place], [place] [address]    Special Requests   Final    BOTTLES DRAWN AEROBIC AND ANAEROBIC Blood Culture adequate volume Performed at [facility] [address], [place], [place] [address]    Culture   Final    NO GROWTH 2 DAYS Performed at [name] [address], [place],  [address]    Report Status PENDING  Incomplete      Radiology Studies: Dg Chest 2 View  Result Date: 02/08/2018 CLINICAL DATA:  Altered mental status EXAM: CHEST - 2 VIEW COMPARISON:  11/19/2016 FINDINGS: Low volume chest. Cardiomegaly and aortic tortuosity is stable, accentuated by scoliotic curvature. There is no edema, consolidation, effusion, or pneumothorax. IMPRESSION: 1. Limited low volume chest.  No definite acute finding. 2. Chronic cardiomegaly. Electronically Signed   By: Monte Fantasia M.D.   On: 02/08/2018 17:38   Ct Head Wo Contrast  Result Date: 02/08/2018 CLINICAL DATA:  Altered level of consciousness, unexplained EXAM: CT HEAD WITHOUT CONTRAST  TECHNIQUE: Contiguous axial images were obtained from the base of the skull through the vertex without intravenous contrast. COMPARISON:  01/07/2018 FINDINGS: Brain: Low-density and expansion at the level of the treated right frontal GBM. There is low-density throughout the cerebral white matter and deep gray nuclei, stable. No evidence of acute infarct, hemorrhage, hydrocephalus, or collection. Vascular: No hyperdense  vessel or unexpected calcification. Skull: Unremarkable right frontal craniotomy. Sinuses/Orbits: Negative IMPRESSION: Treated right frontal GBM and extensive white matter disease. No acute finding or detected change from most recent staging MRI 01/07/2018. Electronically Signed   By: Monte Fantasia M.D.   On: 02/08/2018 18:21   Ct Abdomen Pelvis W Contrast  Result Date: 02/08/2018 CLINICAL DATA:  Dysuria for 5 weeks.  Patient with brain cancer. EXAM: CT ABDOMEN AND PELVIS WITH CONTRAST TECHNIQUE: Multidetector CT imaging of the abdomen and pelvis was performed using the standard protocol following bolus administration of intravenous contrast. CONTRAST:  132mL ISOVUE-300 IOPAMIDOL (ISOVUE-300) INJECTION 61% COMPARISON:  November 20, 2004 FINDINGS: Lower chest: Scattered opacity in the right base and right middle lobe is favored to be atelectasis. Opacity in the left base and lingula could represent atelectasis or infiltrate. There does appear to be some volume loss on the left. Cardiomegaly. 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 Hepatobiliary: No focal liver abnormality is seen. No gallstones, gallbladder wall thickening, or biliary dilatation. Pancreas: Unremarkable. No pancreatic ductal dilatation or surrounding inflammatory changes. Spleen: Normal in size without focal abnormality. Adrenals/Urinary Tract: Left kidney is relatively atrophic versus the right. The kidneys, ureters, and bladder are otherwise normal. Stomach/Bowel: The distal esophagus, stomach, and small bowel are normal.  The colon is normal. The patient is status post appendectomy. Vascular/Lymphatic: Mild atherosclerotic change in the nonaneurysmal tortuous aorta. No adenopathy. Reproductive: Uterus and bilateral adnexa are unremarkable. Other: No abdominal wall hernia or abnormality. No abdominopelvic ascites. Musculoskeletal: Scoliotic curvature of the thoracolumbar spine. IMPRESSION: 1. Scattered opacity in the bases, right middle lobe, and lingula are favored to represent atelectasis. It would be difficult to completely exclude infiltrate on the left. 2. 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 Recommend annual imaging followup by CTA or MRA. This recommendation follows 2010 ACCF/AHA/AATS/ACR/ASA/SCA/SCAI/SIR/STS/SVM Guidelines for the Diagnosis and Management of Patients with Thoracic Aortic Disease. Circulation. 2010; 121: J335-K562 3. 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 4. Scoliosis. Electronically Signed   By: Dorise Bullion III M.D   On: 02/08/2018 22:03     Scheduled Meds: . apixaban  5 mg Oral BID  . dexamethasone  2 mg Oral Q breakfast  . levETIRAcetam  500 mg Oral BID  . polyethylene glycol  17 g Oral Daily  . primidone  50 mg Oral BID  . senna  1 tablet Oral Daily   Continuous Infusions: . azithromycin Stopped (02/10/18 0527)  . cefTRIAXone (ROCEPHIN)  IV Stopped (02/10/18 0657)     LOS: 2 days   Time Spent in minutes   45 minutes (greater than 50% of time spent with patient face to face, reviewing labs, and discussing case with Dr. Mickeal Skinner and formulating a plan)  Cristal Ford D.O. on 02/10/2018 at 12:53 PM  Between 7am to 7pm - Pager - [phone]  After 7pm go to [email or web address] - password [email or web address]  And look for the night coverage person covering for me after hours  [facility] Office  [phone]

## 2018-02-10 NOTE — NC FL2 (Signed)
[place] LEVEL OF CARE SCREENING TOOL     IDENTIFICATION  Patient Name: Donna Valentine Birthdate: 1941/06/22 Sex: female Admission Date (Current Location): 02/08/2018  [facility] [place] Number:  Herbalist and Address:  [facility],  [place] [address], [place]      Provider Number: [id number]  Attending Physician Name and Address:  Cristal Ford, DO  Relative Name and Phone Number:       Current Level of Care: Hospital Recommended Level of Care: [place] Prior Approval Number:    Date Approved/Denied:   PASRR Number:    Discharge Plan: SNF    Current Diagnoses: Patient Active Problem List   Diagnosis Date Noted  . History of DVT (deep vein thrombosis) 02/09/2018  . Sepsis ([place]) 02/09/2018  . Debility 02/08/2018  . Dehydration 02/08/2018  . CAP (community acquired pneumonia) 02/08/2018  . Oral candida 01/08/2017  . Goals of care, counseling/discussion   . Glioblastoma multiforme of frontal lobe ([place]) 12/01/2016  . Multifocal glioblastoma of the right frontal lobe of brain ([place]) 11/20/2016  . Left-sided weakness 11/20/2016  . 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 HYPERCHOLESTEROLEMIA, BORDERLINE 07/28/2008  . ANEMIA, MILD 07/28/2008  . DIVERTICULOSIS OF COLON 07/28/2008  . HEMATURIA, HX OF 07/28/2008  . COLONIC POLYPS 07/27/2008  . RIGHT BUNDLE BRANCH BLOCK 07/27/2008  . PREMATURE ATRIAL CONTRACTIONS 07/27/2008  . HEMORRHOIDS 07/27/2008  . VENOUS INSUFFICIENCY 07/27/2008  . BACK PAIN, LUMBAR 07/27/2008  . SCOLIOSIS 07/27/2008  . PALPITATIONS, HX OF 07/27/2008    Orientation RESPIRATION BLADDER Height & Weight     Self, Time, Situation, Place  Normal Continent Weight: 199 lb (90.3 kg) Height:  5\' 5"  (165.1 cm)  BEHAVIORAL SYMPTOMS/MOOD NEUROLOGICAL BOWEL NUTRITION STATUS      Continent Diet(Heart Healthy )  AMBULATORY STATUS COMMUNICATION OF NEEDS Skin   Extensive Assist  Verbally Normal                       Personal Care Assistance Level of Assistance  Bathing, Feeding, Dressing Bathing Assistance: Limited assistance Feeding assistance: Independent Dressing Assistance: Limited assistance     Functional Limitations Info  Sight, Hearing, Speech Sight Info: Adequate Hearing Info: Adequate Speech Info: Adequate    SPECIAL CARE FACTORS FREQUENCY  PT (By licensed PT), OT (By licensed OT)     PT Frequency: 5x/week OT Frequency: 5x/week            Contractures Contractures Info: Not present    Additional Factors Info  Code Status, Allergies Code Status Info: Fullcode Allergies Info: Allergies: Penicillins, Food           Current Medications (02/10/2018):  This is the current hospital active medication list Current Facility-Administered Medications  Medication Dose Route Frequency Provider Last Rate Last Dose  . acetaminophen (TYLENOL) tablet 650 mg  650 mg Oral Q6H PRN Toy Baker, MD   650 mg at 02/09/18 0346   Or  . acetaminophen (TYLENOL) suppository 650 mg  650 mg Rectal Q6H PRN Doutova, Anastassia, MD      . apixaban (ELIQUIS) tablet 5 mg  5 mg Oral BID Toy Baker, MD   5 mg at 02/10/18 0946  . azithromycin (ZITHROMAX) 500 mg in sodium chloride 0.9 % 250 mL IVPB  500 mg Intravenous Q24H Toy Baker, MD   Stopped at 02/10/18 0527  . cefTRIAXone (ROCEPHIN) 1 g in sodium chloride 0.9 % 100 mL IVPB  1 g Intravenous Q0600 Doutova,  Nyoka Lint, MD   Stopped at 02/10/18 6770  . dexamethasone (DECADRON) tablet 2 mg  2 mg Oral Q breakfast Toy Baker, MD   2 mg at 02/10/18 0946  . HYDROcodone-acetaminophen (NORCO/VICODIN) 5-325 MG per tablet 1-2 tablet  1-2 tablet Oral Q4H PRN Toy Baker, MD      . levETIRAcetam (KEPPRA) tablet 500 mg  500 mg Oral BID Toy Baker, MD   500 mg at 02/10/18 0945  . ondansetron (ZOFRAN) tablet 4 mg  4 mg Oral Q6H PRN Doutova, Anastassia, MD       Or  .  ondansetron (ZOFRAN) injection 4 mg  4 mg Intravenous Q6H PRN Doutova, Anastassia, MD      . polyethylene glycol (MIRALAX / GLYCOLAX) packet 17 g  17 g Oral Daily Cristal Ford, DO   17 g at 02/10/18 0945  . primidone (MYSOLINE) tablet 50 mg  50 mg Oral BID Toy Baker, MD   50 mg at 02/10/18 0946  . senna (SENOKOT) tablet 8.6 mg  1 tablet Oral Daily Doutova, Anastassia, MD   8.6 mg at 02/10/18 3403     Discharge Medications: Please see discharge summary for a list of discharge medications.  Relevant Imaging Results:  Relevant Lab Results:   Additional Information ssn:[id number]  Lia Hopping, LCSW

## 2018-02-10 NOTE — Plan of Care (Signed)
Plan of care discussed.   

## 2018-02-11 LAB — BASIC METABOLIC PANEL
ANION GAP: 8 (ref 5–15)
BUN: 14 mg/dL (ref 6–20)
CO2: 24 mmol/L (ref 22–32)
Calcium: 8.1 mg/dL — ABNORMAL LOW (ref 8.9–10.3)
Chloride: 108 mmol/L (ref 101–111)
Creatinine, Ser: 0.45 mg/dL (ref 0.44–1.00)
GFR calc Af Amer: >60 mL/min (ref >60)
GLUCOSE: 116 mg/dL — AB (ref 65–99)
POTASSIUM: 4.1 mmol/L (ref 3.5–5.1)
Sodium: 140 mmol/L (ref 135–145)

## 2018-02-11 LAB — CBC
HEMATOCRIT: 40.9 % (ref 36.0–46.0)
HEMOGLOBIN: 13 g/dL (ref 12.0–15.0)
MCH: 29.2 pg (ref 26.0–34.0)
MCHC: 31.8 g/dL (ref 30.0–36.0)
MCV: 91.9 fL (ref 78.0–100.0)
Platelets: 120 10*3/uL — ABNORMAL LOW (ref 150–400)
RBC: 4.45 MIL/uL (ref 3.87–5.11)
RDW: 14.7 % (ref 11.5–15.5)
WBC: 5.2 10*3/uL (ref 4.0–10.5)

## 2018-02-11 MED ORDER — AZITHROMYCIN 250 MG PO TABS
500.0000 mg | ORAL_TABLET | Freq: Every day | ORAL | Status: DC
  Administered 2018-02-12: 500 mg via ORAL
  Filled 2018-02-11 (×2): qty 2

## 2018-02-11 NOTE — Progress Notes (Signed)
Patient to discharge [place] authorization still pending at this time.   Kathrin Greathouse, Latanya Presser, MSW Clinical Social Worker  [phone] 02/11/2018  2:06 PM

## 2018-02-11 NOTE — Progress Notes (Signed)
Physical Therapy Treatment Patient Details Name: Donna Valentine MRN: [id number] DOB: [birth year] Today's Date: 02/11/2018    History of Present Illness 77 y.o. 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 Pt admitted with worsening weakness, Pna, UTI.     PT Comments    Pt continues to require significant +2 assist for mobility. She fatigued easily on today. She remains at high risk for falls when mobilizing so lift equipment is safest at this time. Will continue to follow and progress activity as tolerated. Continue to recommend SNF.     Follow Up Recommendations  SNF(if pt/family agreeable)     Equipment Recommendations  None recommended by PT    Recommendations for Other Services       Precautions / Restrictions Precautions Precautions: Fall Restrictions Weight Bearing Restrictions: No    Mobility  Bed Mobility Overal bed mobility: Needs Assistance Bed Mobility: Supine to Sit     Supine to sit: Max assist; +2 physical assistance; +2 safety/equipment; HOB elevated     General bed mobility comments: Assist for trunk and bil LEs. Utilized bedpad for scooting, positioning. Increased time.   Transfers Overall transfer level: Needs assistance Equipment used: Rolling walker (2 wheeled) Transfers: Sit to/from Stand Sit to Stand: Mod assist;+2 physical assistance;+2 safety/equipment         General transfer comment: Sit to stand x 3 STEDY. Assist to shift weight anteriorly, rise, stabilize, maintain grip of L hand on handle of STEDY bar. Cues and facilitation for full hip and trunk extension. Poor standing tolerance on today. Pt c/o back pain as well.  High fall risk. Used STEDY to transfer to recliner  Ambulation/Gait             General Gait Details: NT-pt is nonambulatory   Marine scientist [place] (Stroke  Patients Only)       Balance Overall balance assessment: Needs assistance   Sitting balance-Leahy Scale: Poor Sitting balance - Comments: Poor sitting balance on today. Leaning to R and anteriorly. Initially required Mod assist to stabilize.                                     Cognition Arousal/Alertness: Awake/alert Behavior During Therapy: WFL for tasks assessed/performed Overall Cognitive Status: Impaired/Different from baseline Area of Impairment: Following commands                       Following Commands: Follows one step commands with increased time              Exercises      General Comments        Pertinent Vitals/Pain Pain Assessment: Faces Faces Pain Scale: Hurts even more Pain Location: back (and legs per daughter) Pain Descriptors / Indicators: Grimacing Pain Intervention(s): Limited activity within patient's tolerance;Repositioned    Home Living                      Prior Function            PT Goals (current goals can now be found in the care plan section) Progress towards PT goals: Progressing toward goals    Frequency    Min 3X/week      PT Plan Current plan  remains appropriate    Co-evaluation              AM-PAC PT "6 Clicks" Daily Activity  Outcome Measure  Difficulty turning over in bed (including adjusting bedclothes, sheets and blankets)?: Unable Difficulty moving from lying on back to sitting on the side of the bed? : Unable Difficulty sitting down on and standing up from a chair with arms (e.g., wheelchair, bedside commode, etc,.)?: Unable Help needed moving to and from a bed to chair (including a wheelchair)?: Total Help needed walking in hospital room?: Total Help needed climbing 3-5 steps with a railing? : Total 6 Click Score: 6    End of Session Equipment Utilized During Treatment: Gait belt Activity Tolerance: Patient tolerated treatment well Patient left: in chair;with call  bell/phone within reach;with family/visitor present   PT Visit Diagnosis: Muscle weakness (generalized) (M62.81);Other abnormalities of gait and mobility (R26.89);Hemiplegia and hemiparesis Hemiplegia - Right/Left: Left Hemiplegia - dominant/non-dominant: Non-dominant     Time: 1100-1135 PT Time Calculation (min) (ACUTE ONLY): 35 min  Charges:  $Therapeutic Activity: 23-37 mins                    G Codes:          Weston Anna, MPT Pager: [phone]

## 2018-02-11 NOTE — Progress Notes (Signed)
PHARMACIST - PHYSICIAN COMMUNICATION DR:   Ree Kida CONCERNING: Antibiotic IV to Oral Route Change Policy  RECOMMENDATION: This patient is receiving azithromycin by the intravenous route.  Based on criteria approved by the Pharmacy and Therapeutics Committee, the antibiotic(s) is/are being converted to the equivalent oral dose form(s).   DESCRIPTION: These criteria include:  Patient being treated for a respiratory tract infection, urinary tract infection, cellulitis or clostridium difficile associated diarrhea if on metronidazole  The patient is not neutropenic and does not exhibit a GI malabsorption state  The patient is eating (either orally or via tube) and/or has been taking other orally administered medications for a least 24 hours  The patient is improving clinically and has a Tmax < 100.5  If you have questions about this conversion, please contact the Pharmacy Department  []   [phone] )  [facility] []   [phone] )  [facility] []   [phone] )  [facility] []   [phone] )  [facility] [x]   [phone] )  [place], PharmD, BCPS 02/11/2018 7:04 AM

## 2018-02-11 NOTE — Progress Notes (Signed)
Occupational Therapy Treatment Patient Details Name: Donna Valentine MRN: [id number] DOB: 07-Mar-1941 Today's Date: 02/11/2018    History of present illness 77 y.o. 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 Pt admitted with worsening weakness, Pna, UTI.    OT comments  Co treat with PT  Follow Up Recommendations  SNF    Equipment Recommendations  None recommended by OT    Recommendations for Other Services      Precautions / Restrictions Precautions Precautions: Fall Restrictions Weight Bearing Restrictions: No       Mobility Bed Mobility Overal bed mobility: Needs Assistance Bed Mobility: Supine to Sit     Supine to sit: Max assist;HOB elevated     General bed mobility comments: Assist for trunk and bil LEs. Utilized bedpad for scooting, positioning. Increased time.   Transfers Overall transfer level: Needs assistance Equipment used: Rolling walker (2 wheeled) Transfers: Sit to/from Stand Sit to Stand: Mod assist;+2 physical assistance;+2 safety/equipment         General transfer comment: Sit to stand x 3 STEDY. Assist to shift weight anteriorly, rise, stabilize, maintain grip of L hand on handle of STEDY bar. Poor standing tolerance on today. Pt c/o back pain as well.  High fall risk. Used STEDY to transfer to recliner    Balance Overall balance assessment: Needs assistance   Sitting balance-Leahy Scale: Poor Sitting balance - Comments: Poor sitting balance on today. Leaning to R and anteriorly. Initially required Mod assist to stabilize.                                    ADL either performed or assessed with clinical judgement   ADL                Discussed getting on toilet with steady. Pt urinated before getting on the steady.  Pt total A with hygiene and would need lift DME for transfer                                Vision Patient Visual Report: No change from baseline            Cognition Arousal/Alertness: Awake/alert Behavior During Therapy: WFL for tasks assessed/performed Overall Cognitive Status: Impaired/Different from baseline Area of Impairment: Following commands                       Following Commands: Follows one step commands with increased time                Exercises     Shoulder Instructions       General Comments      Pertinent Vitals/ Pain       Pain Assessment: Faces Faces Pain Scale: Hurts even more Pain Location: back (and legs per daughter) Pain Descriptors / Indicators: Grimacing Pain Intervention(s): Limited activity within patient's tolerance;Monitored during session  Home Living                                          Prior Functioning/Environment              Frequency  Min 2X/week        Progress Toward  Goals  OT Goals(current goals can now be found in the care plan section)  Progress towards OT goals: Progressing toward goals     Plan         AM-PAC PT "6 Clicks" Daily Activity     Outcome Measure   Help from another person eating meals?: A Lot Help from another person taking care of personal grooming?: A Lot Help from another person toileting, which includes using toliet, bedpan, or urinal?: Total Help from another person bathing (including washing, rinsing, drying)?: A Lot Help from another person to put on and taking off regular upper body clothing?: A Lot Help from another person to put on and taking off regular lower body clothing?: Total 6 Click Score: 10    End of Session Equipment Utilized During Treatment: Rolling walker;Gait belt  OT Visit Diagnosis: Unsteadiness on feet (R26.81);Repeated falls (R29.6);History of falling (Z91.81);Other abnormalities of gait and mobility (R26.89);Muscle weakness (generalized) (M62.81);Hemiplegia and hemiparesis   Activity Tolerance Patient  tolerated treatment well   Patient Left in bed;with call bell/phone within reach   Nurse Communication Mobility status;Need for lift equipment        Time: 1100-1135 OT Time Calculation (min): 35 min  Charges: OT General Charges $OT Visit: 1 Visit OT Treatments $Self Care/Home Management : 23-37 mins  [place], [place]   Betsy Pries 02/11/2018, 12:41 PM

## 2018-02-11 NOTE — Care Management Important Message (Signed)
Important Message  Patient Details  Name: Donna Valentine MRN: [id number] Date of Birth: [birth year]   Medicare Important Message Given:  Yes    Kerin Salen 02/11/2018, 11:14 AMImportant Message  Patient Details  Name: Donna Valentine MRN: [id number] Date of Birth: 22-Aug-1941   Medicare Important Message Given:  Yes    Kerin Salen 02/11/2018, 11:14 AM

## 2018-02-11 NOTE — Progress Notes (Signed)
PROGRESS NOTE    Donna Valentine  JAS:[id number] DOB: 23-Jul-1941 DOA: 02/08/2018 PCP: [facility]   Brief Narrative:  HPI On 02/08/2018 by Dr. Toy Baker Donna Valentine is a 77 y.o. female with medical history significant of right frontalglioblastoma sp ressection XRT, recurrent UTI, hx of DVT, Left side weakness,   Presented with 5 weeks of recurrent dysuria and generalized fatigue in the past has been treated with Cipro for possible UTI she has finished 4 weeks of Cipro together.  She has been getting progressive fatigue and falls decreased appetite and overall progressive decline increasing confusion trouble ambulating moving around the house the oncologist have seen her today and recommended admission for UTI treatment and rehydration. Reports she in general was very fatigued. Family states she is a bit sort of breath. no chest pain.  Last urine culture from 11of April showed Klebsiella pneumonia sensitive to Cipro but resistant to ampicillin and Macrobid  Interim history Admitted for sepsis secondary to pneumonia. Pending SNF auth  Assessment & Plan   Sepsis secondary to possible Pneumonia vs UTI -Patient presented with tachycardia, tachypnea, elevated lactic acid level -vital signs and lactic acid have normalized -CXR shows no definite acute finding -CT abd/pelvis: Scattered opacity in the bases, right middle lobe, and lingula -Blood cultures no growth to date -UA unremarkable for infection, urine culture showed no growth -Strep pneumonia urine Ag negative  -Patient recently treated for Klebsiella pneumonia UTI with ciprofloxacin -Continue ceftriaxone, azithromycin, IV fluids  Glioblastoma -CT head showed treated right frontal GBM and extensive white matter disease.  No acute finding or detected change since recent MRI 01/07/2018 -Dr. Mickeal Skinner, made aware of patient's admission -Continue keppra, decadron -Discussed with Dr. 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 -Need annual follow-up with CTA or MRA  History of DVT -Continue Eliquis  Generalized weakness and debility -PT and OT recommended SNF -patient wanting to go home with home health -had long conversation with patient and daughter at bedside regarding rehab and possibly needing more help than what can be offered at home; they will think about rehab -daughter hs decided patient should go to SNF, social work consulted and pending auth  Chronic thrombocytopenia -currently platelets appear stable, continue to monitor CBC  DVT Prophylaxis  Lovenox  Code Status: Full  Family Communication: Husband at bedside  Disposition Plan: Admitted. Dispo SNF- pending auth  Consultants Oncology, Dr. Mickeal Skinner  Procedures  None  Antibiotics   Anti-infectives (From admission, onward)   Start     Dose/Rate Route Frequency Ordered Stop   02/12/18 0600  azithromycin (ZITHROMAX) tablet 500 mg     500 mg Oral Daily 02/11/18 0704 02/16/18 0959   02/09/18 0400  azithromycin (ZITHROMAX) 500 mg in sodium chloride 0.9 % 250 mL IVPB  Status:  Discontinued     500 mg 250 mL/hr over 60 Minutes Intravenous Every 24 hours 02/09/18 0227 02/11/18 0704   02/09/18 0245  cefTRIAXone (ROCEPHIN) 1 g in sodium chloride 0.9 % 100 mL IVPB     1 g 200 mL/hr over 30 Minutes Intravenous Daily 02/09/18 0227 02/16/18 0559   02/08/18 1645  ceFEPIme (MAXIPIME) 2 g in sodium chloride 0.9 % 100 mL IVPB     2 g 200 mL/hr over 30 Minutes Intravenous  Once 02/08/18 1630 02/08/18 1851      Subjective:  Donna Valentine seen and examined today.  Feeling better this morning.  Denies any current chest pain, shortness breath, abdominal pain, nausea vomiting, diarrhea or constipation.  Objective:   Vitals:   02/10/18 1409 02/10/18 2151 02/11/18 0600 02/11/18  1358  BP: (!) 145/72 (!) 158/84 (!) 172/77 (!) 149/75  Pulse: 90 76 68 78  Resp: 18   16  Temp: 98.6 F (37 C) 98.4 F (36.9 C) 97.9 F (36.6 C) 98 F (36.7 C)  TempSrc: Oral Oral Oral Oral  SpO2: 97% 95% 95% 98%  Weight:      Height:        Intake/Output Summary (Last 24 hours) at 02/11/2018 1502 Last data filed at 02/11/2018 0603 Gross per 24 hour  Intake 860 ml  Output 900 ml  Net -40 ml   Filed Weights   02/08/18 1507 02/09/18 0531  Weight: 90.3 kg (199 lb) 90.3 kg (199 lb)   Exam  General: Well developed, chronically ill appearing, NAD  HEENT: NCAT, mucous membranes moist.   Neck: Supple  Cardiovascular: S1 S2 auscultated, RRR  Respiratory: Clear to auscultation bilaterally with equal chest rise  Abdomen: Soft, nontender, nondistended, + bowel sounds  Extremities: warm dry without cyanosis clubbing or edema  Neuro: AAOx3, nonfocal  Skin: Without rashes exudates or nodules  Psych: Appropriate mood and affect, pleasant   Data Reviewed: I have personally reviewed following labs and imaging studies  CBC: Recent Labs  Lab 02/08/18 1712 02/09/18 0636 02/10/18 0559 02/11/18 0550  WBC 6.1 6.2 4.8 5.2  NEUTROABS 4.8  --   --   --   HGB 15.4* 15.6* 13.0 13.0  HCT 47.5* 48.1* 40.6 40.9  MCV 93.0 92.5 92.3 91.9  PLT 139* 125* 106* 481*   Basic Metabolic Panel: Recent Labs  Lab 02/08/18 1712 02/09/18 0636 02/10/18 0559 02/11/18 0550  NA 142 142 145 140  K 4.1 3.8 3.6 4.1  CL 107 107 113* 108  CO2 25 24 24 24   GLUCOSE 177* 102* 105* 116*  BUN 21* 15 15 14   CREATININE 0.74 0.51 0.45 0.45  CALCIUM 9.1 8.6* 8.3* 8.1*  MG  --  1.9  --   --   PHOS  --  2.9  --   --    GFR: Estimated Creatinine Clearance: 67.4 mL/min (by C-G formula based on SCr of 0.45 mg/dL). Liver Function Tests: Recent Labs  Lab 02/08/18 1712 02/09/18 0636  AST 22 20  ALT 30 27  ALKPHOS 59 55  BILITOT 0.8 0.7  PROT 7.0 6.2*  ALBUMIN 3.3* 3.0*   No results for input(s):  LIPASE, AMYLASE in the last 168 hours. No results for input(s): AMMONIA in the last 168 hours. Coagulation Profile: No results for input(s): INR, PROTIME in the last 168 hours. Cardiac Enzymes: No results for input(s): CKTOTAL, CKMB, CKMBINDEX, TROPONINI in the last 168 hours. BNP (last 3 results) No results for input(s): PROBNP in the last 8760 hours. HbA1C: No results for input(s): HGBA1C in the last 72 hours. CBG: No results for input(s): GLUCAP in the last 168 hours. Lipid Profile: No results for input(s): CHOL, HDL, LDLCALC, TRIG, CHOLHDL, LDLDIRECT in the last 72 hours. Thyroid Function Tests: Recent Labs    02/09/18 0636  TSH 5.137*  FREET4 1.14   Anemia Panel: No results for input(s): VITAMINB12, FOLATE, FERRITIN, TIBC, IRON, RETICCTPCT in the last 72 hours. Urine analysis:    Component Value Date/Time   COLORURINE YELLOW 02/08/2018 [place] 02/08/2018  1712   LABSPEC 1.012 02/08/2018 1712   LABSPEC 1.020 04/28/2017 0909   PHURINE 5.0 02/08/2018 1712   GLUCOSEU 50 (A) 02/08/2018 1712   GLUCOSEU Negative 04/28/2017 0909   HGBUR NEGATIVE 02/08/2018 1712   BILIRUBINUR NEGATIVE 02/08/2018 1712   BILIRUBINUR Negative 04/28/2017 0909   [place] 02/08/2018 1712   PROTEINUR NEGATIVE 02/08/2018 1712   UROBILINOGEN 0.2 04/28/2017 0909   NITRITE NEGATIVE 02/08/2018 1712   LEUKOCYTESUR NEGATIVE 02/08/2018 1712   LEUKOCYTESUR Negative 04/28/2017 0909   Sepsis Labs: @LABRCNTIP (procalcitonin:4,lacticidven:4)  ) Recent Results (from the past 240 hour(s))  Blood culture (routine x 2)     Status: None (Preliminary result)   Collection Time: 02/08/18  4:16 PM  Result Value Ref Range Status   Specimen Description   Final    BLOOD RIGHT ANTECUBITAL Performed at [facility], [place] [address] [place], [place] [address]    Special Requests   Final    BOTTLES DRAWN AEROBIC ONLY Blood Culture results may not be optimal due to an  inadequate volume of blood received in culture bottles Performed at [place] [address] [place], [place] [address]    Culture   Final    NO GROWTH 2 DAYS Performed at [place] [address], [place], [place] [address]    Report Status PENDING  Incomplete  Urine culture     Status: None   Collection Time: 02/08/18  5:12 PM  Result Value Ref Range Status   Specimen Description   Final    URINE, CLEAN CATCH Performed at [facility], [place] [address] [place], [place] [address]    Special Requests   Final    Normal Performed at [facility], [place] [address] [place], [place] [address]    Culture   Final    NO GROWTH Performed at [facility], [place] [address], [place], [place] [address]    Report Status 02/10/2018 FINAL  Final  Blood culture (routine x 2)     Status: None (Preliminary result)   Collection Time: 02/08/18  5:12 PM  Result Value Ref Range Status   Specimen Description   Final    BLOOD BLOOD LEFT FOREARM Performed at [place] [address], [place], [place] [address]    Special Requests   Final    BOTTLES DRAWN AEROBIC AND ANAEROBIC Blood Culture adequate volume Performed at [place] [address] [place], [place] [address]    Culture   Final    NO GROWTH 2 DAYS Performed at [place] [address], [place], [place] [address]    Report Status PENDING  Incomplete      Radiology Studies: No results found.   Scheduled Meds: . apixaban  5 mg Oral BID  . [START ON 02/12/2018] azithromycin  500 mg Oral Daily  . dexamethasone  2 mg Oral Q breakfast  . levETIRAcetam  500 mg Oral BID  . polyethylene glycol  17 g Oral Daily  . primidone  50 mg Oral BID  . senna  1 tablet Oral Daily   Continuous Infusions: . cefTRIAXone (ROCEPHIN)  IV Stopped (02/11/18 0533)     LOS: 3 days   Time Spent in minutes   45 minutes (greater  than 50% of time spent with patient face to face, reviewing labs, and discussing case with Dr. Mickeal Skinner and formulating a plan)  Cristal Ford D.O. on 02/11/2018  at 3:02 PM  Between 7am to 7pm - Pager - [phone]  After 7pm go to [email or web address] - password [email or web address]  And look for the night coverage person covering for me after hours  [facility] Office  [phone]

## 2018-02-11 NOTE — Clinical Social Work Note (Addendum)
Clinical Social Work Assessment  Patient Details  Name: Donna Valentine MRN: [id number] Date of Birth: Nov 23, 1940  Date of referral:  02/11/18               Reason for consult:  Discharge Planning                Permission sought to share information with:  Chartered certified accountant granted to share information::     Name::        Agency::  SNF-[facility]  Relationship::  Daughters-  Sport and exercise psychologist Information:     Housing/Transportation Living arrangements for the past 2 months:  Single Family Home Source of Information:  Adult Children Patient Interpreter Needed:  None Criminal Activity/Legal Involvement Pertinent to Current Situation/Hospitalization:  No - Comment as needed Significant Relationships:  Adult Children, Spouse Lives with:  Spouse, Adult Children Do you feel safe going back to the place where you live?  Yes Need for family participation in patient care:  Yes (Comment)  Care giving concerns:   Patient presented with five weeks of recurrent dysuria and generalized fatigue, falls and decreased appetite. She has confusion trouble ambulating  Physical therapy recommends SNF placement for rehab.   Social Worker assessment / plan:  CSW met with the patient daughter Barnett Applebaum to discuss patient discharge plan. She reports the patient prefers to go home with family but at this time her daughters and spouse are unable to provide the assistance she needs. The patient requiring two plus assist to get her to the bedside commode. Daughter states she has been living with her mother providing necessary care. She thinks the patient can benefit from rehab to regain her strength before returning home. Daughter reports she went to [facility] in the past and stayed a month before discharging home. She report they are familiar with SNF process and insurance authorization process, as they had difficulties before with the insurance process.  Patient daughters are agreeable  to [facility]. Facility has initiated authorization.  Barriers to D/C: [place].   Plan: SNF placement.   Employment status:  Retired Nurse, adult PT Recommendations:  [place] / Referral to community resources:  [place]  Patient/Family's Response to care:  Agreeable and Responding to care.   Patient/Family's Understanding of and Emotional Response to Diagnosis, Current Treatment, and Prognosis:  Patient has brian tumor and has significant confusion. Patient daughter share HCPOA and have good understanding of her diagnosis and treatment plan.  Emotional Assessment Appearance:  Appears stated age Attitude/Demeanor/Rapport:    Affect (typically observed):  Unable to Assess Orientation:  Oriented to Self Alcohol / Substance use:  Not Applicable Psych involvement (Current and /or in the community):  No (Comment)  Discharge Needs  Concerns to be addressed:  Discharge Planning Concerns Readmission within the last 30 days:  No Current discharge risk:  Dependent with Mobility Barriers to Discharge:  [place], [place] 02/11/2018, 1:47 PM

## 2018-02-12 MED ORDER — CEFUROXIME AXETIL 500 MG PO TABS: 500.0000 mg | ORAL_TABLET | Freq: Two times a day (BID) | ORAL | 0 refills | Status: AC

## 2018-02-12 MED ORDER — AZITHROMYCIN 250 MG PO TABS: 250.0000 mg | ORAL_TABLET | Freq: Every day | ORAL | 0 refills | Status: DC

## 2018-02-12 NOTE — Discharge Instructions (Signed)

## 2018-02-12 NOTE — Clinical Social Work Placement (Addendum)
Insurance authorization received.    CLINICAL SOCIAL WORK PLACEMENT  NOTE  Date:  02/12/2018  Patient Details  Name: Donna Valentine MRN: [id number] Date of Birth: 1940-12-08  Clinical Social Work is seeking post-discharge placement for this patient at the [place] level of care (*CSW will initial, date and re-position this form in  chart as items are completed):  Yes   Patient/family provided with [place] Work Department's list of facilities offering this level of care within the geographic area requested by the patient (or if unable, by the patient's family).  Yes   Patient/family informed of their freedom to choose among providers that offer the needed level of care, that participate in Medicare, Medicaid or managed care program needed by the patient, have an available bed and are willing to accept the patient.  Yes   Patient/family informed of [place]'s ownership interest in [facility] and [facility], as well as of the fact that they are under no obligation to receive care at these facilities.  PASRR submitted to EDS on       PASRR number received on       Existing PASRR number confirmed on 02/11/18     FL2 transmitted to all facilities in geographic area requested by pt/family on       FL2 transmitted to all facilities within larger geographic area on 02/12/18     Patient informed that his/her managed care company has contracts with or will negotiate with certain facilities, including the following:        Yes   Patient/family informed of bed offers received.  Patient chooses bed at     [facility]  Physician recommends and patient chooses bed at    [facility]   Patient to be transferred to   on 02/12/18.  Patient to be transferred to facility by ptar      Patient family notified on 02/12/18 of transfer.  Name of family member notified:  Daughter-Gina     PHYSICIAN Please prepare priority discharge  summary, including medications     Additional Comment:    _______________________________________________ Lia Hopping, LCSW 02/12/2018, 9:35 AM

## 2018-02-12 NOTE — Discharge Summary (Signed)
Physician Discharge Summary  Donna Valentine IPJ:[id number] DOB: [birth year] DOA: 02/08/2018  PCP: [facility]  Admit date: 02/08/2018 Discharge date: 02/12/2018  Time spent: 45 minutes  Recommendations for Outpatient Follow-up:  Patient will be discharged to skilled nursing facility. Continue physical and occupational therapy.  Patient will need to follow up with primary care provider within one week of discharge.  Follow up with Dr. Mickeal Skinner, oncology.  Patient should continue medications as prescribed.  Patient should follow a heart healthy diet.   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 Lynett Fish Harmon-Dixonis a 76 y.o.femalewith medical history significant of right frontalglioblastomasp ressection XRT,recurrent UTI, hx of DVT, Left side weakness,  Presented with5 weeks of recurrent dysuria and generalized fatigue in the past has been treated with Cipro for possible UTI she has finished 4 weeks of Cipro together. She has been getting progressive fatigue and falls decreased appetite and overall progressive decline increasing confusion trouble ambulating moving around the house the oncologist have seen her today and recommended admission for UTI treatment and rehydration. Reports she in general was very fatigued. Family states she is a bit sort of breath. no chest pain.  Last urine culture from 11ofApril showed Klebsiella pneumonia sensitive to Cipro but resistant to ampicillin and [facility] Course:  Sepsis secondary to Pneumonia -Patient presented with tachycardia, tachypnea, elevated lactic acid  level -vital signs and lactic acid have normalized -CXR shows no definite acute finding -CT abd/pelvis: Scattered opacity in the bases, right middle lobe, and lingula -Blood cultures no growth to date -UA unremarkable for infection, urine culture showed no growth -Strep pneumonia urine Ag negative  -Patient recently treated for Klebsiella pneumonia UTI with ciprofloxacin -was placed on ceftriaxone, azithromycin, IV fluids -will discharge with Azithromycin and ceftin for additional 3 days (of note patient has a PCN allergy, however, has been on ceftriaxone during admission and tolerated it well)  Glioblastoma -CT head showed treated right frontal GBM and extensive white matter disease.  No acute finding or detected change since recent MRI 01/07/2018 -Dr. Mickeal Skinner, made aware of patient's admission -Continue keppra, decadron -Discussed with Dr. Mickeal Skinner, may want to start hydrocortisone to aid in weaning decadron, however will hold off for now- and start as an outpatient   Ascending aorta changes -CT showed measurement of 4.4cm -Need annual follow-up with CTA or MRA  History of DVT -Continue Eliquis  Generalized weakness and debility -PT and OT recommended SNF -patient wanting to go home with home health -had long conversation with patient and daughter at bedside regarding rehab and possibly needing more help than what can be offered at home; they will think about rehab -daughter hs decided patient should go to SNF, social work consulted   Chronic thrombocytopenia -currently platelets appear stable, 120  Procedures: None  Consultations: Dr. Mickeal Skinner, oncology  Discharge Exam: Vitals:   02/11/18 2128 02/12/18 0554  BP:  (!) 144/73  Pulse:  76  Resp:  20  Temp: 98.6 F (37 C) 98.2 F (36.8 C)  SpO2:  94%     General: Well developed, chronically ill appearing, NAD  HEENT: NCAT, mucous membranes moist.  Cardiovascular: S1 S2 auscultated, RRR  Respiratory:  Clear to  auscultation bilaterally with equal chest rise  Abdomen: Soft, obese, nontender, nondistended, + bowel sounds  Extremities: warm dry without cyanosis clubbing  Discharge Instructions Discharge Instructions    Discharge instructions   Complete by:  As directed    Patient will be discharged to skilled nursing facility. Continue physical and occupational therapy.  Patient will need to follow up with primary care provider within one week of discharge.  Follow up with Dr. Mickeal Skinner, oncology.  Patient should continue medications as prescribed.  Patient should follow a heart healthy diet.     Allergies as of 02/12/2018      Reactions   Penicillins Other (See Comments)   Hallucination Has patient had a PCN reaction causing immediate rash, facial/tongue/throat swelling, SOB or lightheadedness with hypotension: No Has patient had a PCN reaction causing severe rash involving mucus membranes or skin necrosis: No Has patient had a PCN reaction that required hospitalization No Has patient had a PCN reaction occurring within the last 10 years: No If all of the above answers are "NO", then may proceed with Cephalosporin use.   Food    Pineapple-Nausea/vomiting      Medication List    STOP taking these medications   ciprofloxacin 500 MG tablet Commonly known as:  CIPRO     TAKE these medications   acetaminophen 500 MG tablet Commonly known as:  TYLENOL Take 500-1,000 mg by mouth daily as needed.   azithromycin 250 MG tablet Commonly known as:  ZITHROMAX Take 1 tablet (250 mg total) by mouth daily.   cefUROXime 500 MG tablet Commonly known as:  CEFTIN Take 1 tablet (500 mg total) by mouth 2 (two) times daily for 3 days.   dexamethasone 1 MG tablet Commonly known as:  DECADRON Take 2 mg by mouth daily with breakfast.   ELIQUIS 5 MG Tabs tablet Generic drug:  apixaban Take 2 tablets 10 mg by mouth twice a day for  10 days then take 1 tablet  mg by mouth twice a day   levETIRAcetam 500  MG tablet Commonly known as:  KEPPRA Take 1 tablet (500 mg total) by mouth 2 (two) times daily.   nystatin 100000 UNIT/ML suspension Commonly known as:  MYCOSTATIN Take 5 mLs by mouth 3 (three) times daily.   nystatin powder Generic drug:  nystatin Apply 100,000 g topically daily as needed.   ondansetron 8 MG tablet Commonly known as:  ZOFRAN Take 1 tablet (8 mg total) by mouth every 8 (eight) hours as needed for nausea.   potassium chloride 10 MEQ tablet Commonly known as:  K-DUR,KLOR-CON Take 1 tablet by mouth See admin instructions. Twice a week (Wed/Sat)   primidone 50 MG tablet Commonly known as:  MYSOLINE Take 50 mg by mouth 2 (two) times daily.   senna 8.6 MG Tabs tablet Commonly known as:  SENOKOT Take 1 tablet by mouth daily.      Allergies  Allergen Reactions  . Penicillins Other (See Comments)    Hallucination Has patient had a PCN reaction causing immediate rash, facial/tongue/throat swelling, SOB or lightheadedness with hypotension: No Has patient had a PCN reaction causing severe rash involving mucus membranes or skin necrosis: No Has patient had a PCN reaction that required hospitalization No Has patient had a PCN reaction occurring within the last 10 years: No If all of the above answers are "NO", then may proceed with Cephalosporin use.   . Food     Pineapple-Nausea/vomiting    Contact information for  follow-up providers    [facility] [employer]. Schedule an appointment as soon as possible for a visit in 1 week(s).   Specialty:  Family Medicine Why:  Hospital follow up Contact information: [address] [facility] [address] [phone]        Ventura Sellers, MD. Schedule an appointment as soon as possible for a visit in 1 week(s).   Specialties:  Psychiatry, Neurology, Oncology Why:  Hospital follow up Contact information: [place] [address] [phone]            Contact information  for after-discharge care    Destination    [facility] .   Service:  Skilled Nursing Contact information: [address] [place] [phone]                   The results of significant diagnostics from this hospitalization (including imaging, microbiology, ancillary and laboratory) are listed below for reference.    Significant Diagnostic Studies: Dg Chest 2 View  Result Date: 02/08/2018 CLINICAL DATA:  Altered mental status EXAM: CHEST - 2 VIEW COMPARISON:  11/19/2016 FINDINGS: Low volume chest. Cardiomegaly and aortic tortuosity is stable, accentuated by scoliotic curvature. There is no edema, consolidation, effusion, or pneumothorax. IMPRESSION: 1. Limited low volume chest.  No definite acute finding. 2. Chronic cardiomegaly. Electronically Signed   By: Monte Fantasia M.D.   On: 02/08/2018 17:38   Ct Head Wo Contrast  Result Date: 02/08/2018 CLINICAL DATA:  Altered level of consciousness, unexplained EXAM: CT HEAD WITHOUT CONTRAST TECHNIQUE: Contiguous axial images were obtained from the base of the skull through the vertex without intravenous contrast. COMPARISON:  01/07/2018 FINDINGS: Brain: Low-density and expansion at the level of the treated right frontal GBM. There is low-density throughout the cerebral white matter and deep gray nuclei, stable. No evidence of acute infarct, hemorrhage, hydrocephalus, or collection. Vascular: No hyperdense vessel or unexpected calcification. Skull: Unremarkable right frontal craniotomy. Sinuses/Orbits: Negative IMPRESSION: Treated right frontal GBM and extensive white matter disease. No acute finding or detected change from most recent staging MRI 01/07/2018. Electronically Signed   By: Monte Fantasia M.D.   On: 02/08/2018 18:21   Ct Abdomen Pelvis W Contrast  Result Date: 02/08/2018 CLINICAL DATA:  Dysuria for 5 weeks.  Patient with brain cancer. EXAM: CT ABDOMEN AND PELVIS WITH CONTRAST  TECHNIQUE: Multidetector CT imaging of the abdomen and pelvis was performed using the standard protocol following bolus administration of intravenous contrast. CONTRAST:  165mL ISOVUE-300 IOPAMIDOL (ISOVUE-300) INJECTION 61% COMPARISON:  November 20, 2004 FINDINGS: Lower chest: Scattered opacity in the right base and right middle lobe is favored to be atelectasis. Opacity in the left base and lingula could represent atelectasis or infiltrate. There does appear to be some volume loss on the left. Cardiomegaly. 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 Hepatobiliary: No focal liver abnormality is seen. No gallstones, gallbladder wall thickening, or biliary dilatation. Pancreas: Unremarkable. No pancreatic ductal dilatation or surrounding inflammatory changes. Spleen: Normal in size without focal abnormality. Adrenals/Urinary Tract: Left kidney is relatively atrophic versus the right. The kidneys, ureters, and bladder are otherwise normal. Stomach/Bowel: The distal esophagus, stomach, and small bowel are normal. The colon is normal. The patient is status post appendectomy. Vascular/Lymphatic: Mild atherosclerotic change in the nonaneurysmal tortuous aorta. No adenopathy. Reproductive: Uterus and bilateral adnexa are unremarkable. Other: No abdominal wall hernia or abnormality. No abdominopelvic ascites. Musculoskeletal: Scoliotic curvature of the thoracolumbar  spine. IMPRESSION: 1. Scattered opacity in the bases, right middle lobe, and lingula are favored to represent atelectasis. It would be difficult to completely exclude infiltrate on the left. 2. 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 Recommend annual imaging followup by CTA or MRA. This recommendation follows 2010 ACCF/AHA/AATS/ACR/ASA/SCA/SCAI/SIR/STS/SVM Guidelines for the Diagnosis and Management of Patients with Thoracic Aortic Disease. Circulation. 2010; 121: H086-V784 3. 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 4. Scoliosis.  Electronically Signed   By: Dorise Bullion III M.D   On: 02/08/2018 22:03    Microbiology: Recent Results (from the past 240 hour(s))  Blood culture (routine x 2)     Status: None (Preliminary result)   Collection Time: 02/08/18  4:16 PM  Result Value Ref Range Status   Specimen Description   Final    BLOOD RIGHT ANTECUBITAL Performed at [facility], [place] [address] [place], [place] [address]    Special Requests   Final    BOTTLES DRAWN AEROBIC ONLY Blood Culture results may not be optimal due to an inadequate volume of blood received in culture bottles Performed at [facility] [address], [place], [place] [address]    Culture   Final    NO GROWTH 3 DAYS Performed at [facility], [place] [address], [place], [place] [address]    Report Status PENDING  Incomplete  Urine culture     Status: None   Collection Time: 02/08/18  5:12 PM  Result Value Ref Range Status   Specimen Description   Final    URINE, CLEAN CATCH Performed at [facility], [place] [address], [place], [place] [address]    Special Requests   Final    Normal Performed at [facility], [place] [address] [place], [place] [address]    Culture   Final    NO GROWTH Performed at [facility], [place] [address], [place], [place] [address]    Report Status 02/10/2018 FINAL  Final  Blood culture (routine x 2)     Status: None (Preliminary result)   Collection Time: 02/08/18  5:12 PM  Result Value Ref Range Status   Specimen Description   Final    BLOOD BLOOD LEFT FOREARM Performed at [facility] [address], [place], [place] [address]    Special Requests   Final    BOTTLES DRAWN AEROBIC AND ANAEROBIC Blood Culture adequate volume Performed at [facility] [address], [place], [place] [address]    Culture   Final    NO GROWTH 3 DAYS Performed at [facility], [place] [address] [place],  [address]    Report Status PENDING  Incomplete     Labs: Basic Metabolic Panel: Recent Labs  Lab 02/08/18 1712 02/09/18 0636 02/10/18 0559 02/11/18 0550  NA 142 142 145 140  K 4.1 3.8 3.6 4.1  CL 107 107 113* 108  CO2 25 24 24 24   GLUCOSE 177* 102* 105* 116*  BUN 21* 15 15 14   CREATININE 0.74 0.51 0.45 0.45  CALCIUM 9.1 8.6* 8.3* 8.1*  MG  --  1.9  --   --   PHOS  --  2.9  --   --    Liver Function Tests: Recent Labs  Lab 02/08/18 1712 02/09/18 0636  AST 22 20  ALT 30 27  ALKPHOS 59 55  BILITOT 0.8 0.7  PROT 7.0 6.2*  ALBUMIN 3.3* 3.0*  No results for input(s): LIPASE, AMYLASE in the last 168 hours. No results for input(s): AMMONIA in the last 168 hours. CBC: Recent Labs  Lab 02/08/18 1712 02/09/18 0636 02/10/18 0559 02/11/18 0550  WBC 6.1 6.2 4.8 5.2  NEUTROABS 4.8  --   --   --   HGB 15.4* 15.6* 13.0 13.0  HCT 47.5* 48.1* 40.6 40.9  MCV 93.0 92.5 92.3 91.9  PLT 139* 125* 106* 120*   Cardiac Enzymes: No results for input(s): CKTOTAL, CKMB, CKMBINDEX, TROPONINI in the last 168 hours. BNP: BNP (last 3 results) Recent Labs    08/18/17 0700  BNP 84.0    ProBNP (last 3 results) No results for input(s): PROBNP in the last 8760 hours.  CBG: No results for input(s): GLUCAP in the last 168 hours.     Signed:  Cristal Ford  [facility] 02/12/2018, 10:10 AM

## 2018-02-13 LAB — CULTURE, BLOOD (ROUTINE X 2)
Culture: NO GROWTH
Culture: NO GROWTH
Special Requests: ADEQUATE

## 2018-02-18 ENCOUNTER — Inpatient Hospital Stay: Payer: Medicare Other | Admitting: Internal Medicine

## 2018-02-18 ENCOUNTER — Other Ambulatory Visit: Payer: Self-pay | Admitting: Internal Medicine

## 2018-02-18 ENCOUNTER — Inpatient Hospital Stay: Payer: Medicare Other

## 2018-02-18 ENCOUNTER — Telehealth: Payer: Self-pay | Admitting: *Deleted

## 2018-02-18 NOTE — Telephone Encounter (Signed)
Spoke with Daughter Barnett Applebaum.  Patient is now at [facility] in [place] post hospitalization.  She states she needs to cancel todays appt and the next set of appts.  They attempted to get patient into wheelchair and to appt by themselves and facility advised they could not assist her transportation with family due to liability reasons and facility transportation would be coordinated by them to her appointments with their equipment and vehicles.  Pending call back from facility to coordinate.

## 2018-02-19 ENCOUNTER — Telehealth: Payer: Self-pay | Admitting: *Deleted

## 2018-02-19 NOTE — Telephone Encounter (Signed)
Received call from [facility] rehab facility regarding patient's missed appts yesterday.  Will plan on rescheduling patient for Tuesday for lab/MD/avastin infusion.  They can have patient here at [facility].  Message sent to scheduling

## 2018-02-23 ENCOUNTER — Inpatient Hospital Stay: Payer: Medicare Other | Attending: Internal Medicine

## 2018-02-23 ENCOUNTER — Telehealth: Payer: Self-pay | Admitting: Internal Medicine

## 2018-02-23 ENCOUNTER — Encounter: Payer: Self-pay | Admitting: Internal Medicine

## 2018-02-23 ENCOUNTER — Telehealth: Payer: Self-pay

## 2018-02-23 ENCOUNTER — Inpatient Hospital Stay (HOSPITAL_BASED_OUTPATIENT_CLINIC_OR_DEPARTMENT_OTHER): Payer: Medicare Other | Admitting: Internal Medicine

## 2018-02-23 VITALS — BP 159/98

## 2018-02-23 VITALS — BP 156/93 | HR 98 | Resp 18 | Ht 65.0 in

## 2018-02-23 DIAGNOSIS — Z5112 Encounter for antineoplastic immunotherapy: Secondary | ICD-10-CM | POA: Diagnosis not present

## 2018-02-23 DIAGNOSIS — C711 Malignant neoplasm of frontal lobe: Secondary | ICD-10-CM | POA: Insufficient documentation

## 2018-02-23 DIAGNOSIS — M6281 Muscle weakness (generalized): Secondary | ICD-10-CM

## 2018-02-23 DIAGNOSIS — R531 Weakness: Secondary | ICD-10-CM

## 2018-02-23 LAB — CMP (CANCER CENTER ONLY)
ALK PHOS: 67 U/L (ref 40–150)
ALT: 31 U/L (ref 0–55)
ANION GAP: 10 (ref 3–11)
AST: 18 U/L (ref 5–34)
Albumin: 3.2 g/dL — ABNORMAL LOW (ref 3.5–5.0)
BILIRUBIN TOTAL: 0.4 mg/dL (ref 0.2–1.2)
BUN: 23 mg/dL (ref 7–26)
CALCIUM: 8.9 mg/dL (ref 8.4–10.4)
CO2: 22 mmol/L (ref 22–29)
Chloride: 107 mmol/L (ref 98–109)
Creatinine: 0.66 mg/dL (ref 0.60–1.10)
GFR, Est AFR Am: >60 mL/min (ref >60)
Glucose, Bld: 241 mg/dL — ABNORMAL HIGH (ref 70–140)
POTASSIUM: 3.9 mmol/L (ref 3.5–5.1)
Sodium: 139 mmol/L (ref 136–145)
TOTAL PROTEIN: 6.4 g/dL (ref 6.4–8.3)

## 2018-02-23 LAB — CBC (CANCER CENTER ONLY)
HEMATOCRIT: 46.7 % — AB (ref 34.8–46.6)
Hemoglobin: 15.4 g/dL (ref 11.6–15.9)
MCH: 29.9 pg (ref 25.1–34.0)
MCHC: 33 g/dL (ref 31.5–36.0)
MCV: 90.4 fL (ref 79.5–101.0)
Platelet Count: 162 10*3/uL (ref 145–400)
RBC: 5.17 MIL/uL (ref 3.70–5.45)
RDW: 15.8 % — ABNORMAL HIGH (ref 11.2–14.5)
WBC: 7.5 10*3/uL (ref 3.9–10.3)

## 2018-02-23 LAB — TOTAL PROTEIN, URINE DIPSTICK: PROTEIN: NEGATIVE mg/dL

## 2018-02-23 MED ORDER — SODIUM CHLORIDE 0.9 % IV SOLN
5.0000 mg/kg | Freq: Once | INTRAVENOUS | Status: AC
  Administered 2018-02-23: 425 mg via INTRAVENOUS
  Filled 2018-02-23: qty 16

## 2018-02-23 MED ORDER — SODIUM CHLORIDE 0.9 % IV SOLN
Freq: Once | INTRAVENOUS | Status: AC
  Administered 2018-02-23: 11:00:00 via INTRAVENOUS

## 2018-02-23 NOTE — Telephone Encounter (Signed)
Task was completed by RN. Per 5/14 in basket

## 2018-02-23 NOTE — Progress Notes (Signed)
These preliminary result these preliminary results were noted.  Awaiting final report.

## 2018-02-23 NOTE — Progress Notes (Signed)
[facility] at [place], [place] [address] [phone]   Interval Evaluation  Date of Service: 02/23/18 Patient Name: Donna Valentine Patient MRN: [id number] Patient DOB: 1941/08/11 Provider: Ventura Sellers, MD  Identifying Statement:  Donna Valentine is a 77 y.o. female with right frontal glioblastoma   Interval History:  Donna Valentine presents today for Avastin infusion.  She has continued to experience decline in functional independence and motor function.  She was recently discharged from admission for pneumonia and has completed antibiotic therapy.  Currently she is residing at a skilled nursing facility in [place].  She is not ambulatory and is full care at this time.  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  Medications: Current Outpatient Medications on File Prior to Visit  Medication Sig Dispense Refill  . acetaminophen (TYLENOL) 500 MG tablet Take 500-1,000 mg by mouth daily as needed.    Marland Kitchen apixaban (ELIQUIS) 5 MG TABS tablet Take 2 tablets 10 mg by mouth twice a day for  10 days then take 1 tablet  mg by mouth twice a day    . dexamethasone (DECADRON) 1 MG tablet Take 2 mg by mouth daily with breakfast.     . fluconazole (DIFLUCAN) 100 MG tablet Take 100 mg by mouth daily. For 10days for yeast infection    . levETIRAcetam (KEPPRA) 500 MG tablet Take 1 tablet (500 mg total) by mouth 2 (two) times daily. 60 tablet 5  . nystatin (MYCOSTATIN) 100000 UNIT/ML suspension Take 5 mLs by mouth 3 (three) times daily.    . potassium chloride (K-DUR,KLOR-CON) 10 MEQ tablet Take 1 tablet by mouth See admin instructions. Twice a week (Wed/Sat)    . primidone (MYSOLINE) 50 MG tablet Take 50 mg by mouth 2 (two) times daily.     Marland Kitchen senna (SENOKOT) 8.6 MG TABS tablet Take 1 tablet by mouth daily.     Marland Kitchen azithromycin (ZITHROMAX) 250 MG tablet Take 1 tablet (250 mg total) by mouth daily. (Patient  not taking: Reported on 02/23/2018) 3 each 0  . nystatin (NYSTATIN) powder Apply 100,000 g topically daily as needed.    . ondansetron (ZOFRAN) 8 MG tablet Take 1 tablet (8 mg total) by mouth every 8 (eight) hours as needed for nausea. (Patient not taking: Reported on 02/23/2018) 30 tablet 3   No current facility-administered medications on file prior to visit.     Allergies:  Allergies  Allergen Reactions  . Penicillins Other (See Comments)    Hallucination Has patient had a PCN reaction causing immediate rash, facial/tongue/throat swelling, SOB or lightheadedness with hypotension: No Has patient had a PCN reaction causing severe rash involving mucus membranes or skin necrosis: No Has patient had a PCN reaction that required hospitalization No Has patient had a PCN reaction occurring within the last 10 years: No If all of the above answers are "NO", then may proceed with Cephalosporin use.   . Food     Pineapple-Nausea/vomiting   Past Medical History:  Past Medical History:  Diagnosis Date  . GBM (glioblastoma multiforme) ([place])    Past Surgical History:  Past Surgical History:  Procedure Laterality Date  . APPENDECTOMY  1994  . APPLICATION OF CRANIAL NAVIGATION N/A 11/28/2016   Procedure: APPLICATION OF CRANIAL NAVIGATION;  Surgeon: Ashok Pall, MD;  Location: [facility];  Service: Neurosurgery;  Laterality: N/A;  . CESAREAN SECTION     x3  . CRANIOTOMY Right 11/28/2016  Procedure: STERIOTACTIC PARIETAL CRANIOTOMY FOR RIGHT FRONTAL TUMOR RESECTION WITH BRAINLAB;  Surgeon: Ashok Pall, MD;  Location: [facility];  Service: Neurosurgery;  Laterality: Right;   Social History:  Social History   Socioeconomic History  . Marital status: Married    Spouse name: Not on file  . Number of children: Not on file  . Years of education: Not on file  . Highest education level: Not on file  Occupational History  . Not on file  Social Needs  . Financial resource strain: Not on file  . Food  insecurity:    Worry: Not on file    Inability: Not on file  . Transportation needs:    Medical: Not on file    Non-medical: Not on file  Tobacco Use  . Smoking status: Former Smoker    Last attempt to quit: 11/26/1991    Years since quitting: 26.2  . Smokeless tobacco: Never Used  Substance and Sexual Activity  . Alcohol use: No  . Drug use: No  . Sexual activity: Not Currently  Lifestyle  . Physical activity:    Days per week: Not on file    Minutes per session: Not on file  . Stress: Not on file  Relationships  . Social connections:    Talks on phone: Not on file    Gets together: Not on file    Attends religious service: Not on file    Active member of club or organization: Not on file    Attends meetings of clubs or organizations: Not on file    Relationship status: Not on file  . Intimate partner violence:    Fear of current or ex partner: Not on file    Emotionally abused: Not on file    Physically abused: Not on file    Forced sexual activity: Not on file  Other Topics Concern  . Not on file  Social History Narrative  . Not on file   Family History:  Family History  Problem Relation Age of Onset  . Heart disease Mother   . Alzheimer's disease Brother   . Cancer Neg Hx     Review of Systems: Constitutional: Fatigue, low energy. Eyes: Denies blurriness of vision Ears, nose, mouth, throat, and face: Denies mucositis or sore throat Respiratory: Denies cough, dyspnea or wheezes Cardiovascular: Denies palpitation, chest discomfort or lower extremity swelling Gastrointestinal:  Denies nausea, constipation, diarrhea GU: no dysuria Skin: Denies abnormal skin rashes Neurological: Per HPI Musculoskeletal: Denies joint pain, back or neck discomfort. No decrease in ROM Behavioral/Psych: Denies anxiety, disturbance in thought content, and mood instability  Physical Exam: Vitals:   02/23/18 1004  BP: (!) 156/93  Pulse: 98  Resp: 18  SpO2: 100%   KPS:  60. General: Drowsy, slumped in wheelchair. Head: Normal EENT: No conjunctival injection or scleral icterus. Oral mucosa moist Lungs: Upper airway ronchi Cardiac: Regular rate and rhythm Abdomen: Soft, non-distended abdomen Skin: No rashes cyanosis or petechiae. Extremities: b/l le edema  Neurologic Exam: Mental Status: Drowsy but easily arouses to talking. Oriented to self and environment. Language is fluent with intact comprehension.  Notable visual spatial neglect on right.  Advanced psychomotor slowing. Cranial Nerves: Visual acuity is grossly normal. Visual fields are full. Extra-ocular movements intact. No ptosis. Face is symmetric, tongue midline. Motor: Tone and bulk are normal. 2/5 in left arm, 2/5 in left leg. Noted coarse asterixis and easily inducible clonus in arm L>R. Reflexes are symmetric, no pathologic reflexes present. Coordination limited 2/2  paresis.  Sensory: Intact to light touch and temperature Gait: Non-ambulatory   Labs: I have reviewed the data as listed    Component Value Date/Time   NA 140 02/11/2018 0550   NA 143 06/16/2017 0917   K 4.1 02/11/2018 0550   K 3.5 06/16/2017 0917   CL 108 02/11/2018 0550   CO2 24 02/11/2018 0550   CO2 29 06/16/2017 0917   GLUCOSE 116 (H) 02/11/2018 0550   GLUCOSE 98 06/16/2017 0917   BUN 14 02/11/2018 0550   BUN 16.1 06/16/2017 0917   CREATININE 0.45 02/11/2018 0550   CREATININE 0.65 02/04/2018 1055   CREATININE 0.9 06/16/2017 0917   CALCIUM 8.1 (L) 02/11/2018 0550   CALCIUM 9.3 06/16/2017 0917   PROT 6.2 (L) 02/09/2018 0636   PROT 6.8 06/16/2017 0917   ALBUMIN 3.0 (L) 02/09/2018 0636   ALBUMIN 3.0 (L) 06/16/2017 0917   AST 20 02/09/2018 0636   AST 15 02/04/2018 1055   AST 15 06/16/2017 0917   ALT 27 02/09/2018 0636   ALT 23 02/04/2018 1055   ALT 13 06/16/2017 0917   ALKPHOS 55 02/09/2018 0636   ALKPHOS 74 06/16/2017 0917   BILITOT 0.7 02/09/2018 0636   BILITOT 0.4 02/04/2018 1055   BILITOT 0.44 06/16/2017  0917   GFRNONAA >60 02/11/2018 0550   GFRNONAA >60 02/04/2018 1055   GFRAA >60 02/11/2018 0550   GFRAA >60 02/04/2018 1055   Lab Results  Component Value Date   WBC 7.5 02/23/2018   NEUTROABS 4.8 02/08/2018   HGB 15.4 02/23/2018   HCT 46.7 (H) 02/23/2018   MCV 90.4 02/23/2018   PLT 162 02/23/2018      Assessment/Plan  1. Glioblastoma multiforme of frontal lobe ([place])  2. Left-sided weakness  Donna Valentine continues to exhibit signs of clinical decline despite completing treatment for underlying infectious process, and despite recent stability on brain MRI.  There is suspicion for non-enhancing progression secondary to avastin pseudo-response or subsequent tumor progression in the last few weeks.    We are ok with Avastin infusion today for palliation and symptom management.  We discussed alternatives such as palliative approach, patient and family prefer to go ahead with treatment today.  We recommended repeat MRI in 2 weeks prior to any further infusions.    Decadron should be continued at 2mg  daily.   We appreciate the opportunity to participate in the care of [facility].    All questions were answered. The patient knows to call the clinic with any problems, questions or concerns. No barriers to learning were detected.  The total time spent in the encounter was 25 minutes and more than 50% was on counseling and review of test results   Ventura Sellers, MD Medical Director of Neuro-Oncology [facility] at [place] 02/23/18 10:05 AM

## 2018-02-23 NOTE — Telephone Encounter (Signed)
Unable to schedule due to capacity in treatment area/ added to tx book  Per 5/14 los

## 2018-02-23 NOTE — Patient Instructions (Signed)
Donna Valentine Discharge Instructions for Patients Receiving Chemotherapy  Today you received the following chemotherapy agent: Avastin   To help prevent nausea and vomiting after your treatment, we encourage you to take your nausea medication as directed  If you develop nausea and vomiting that is not controlled by your nausea medication, call the clinic.   BELOW ARE SYMPTOMS THAT SHOULD BE REPORTED IMMEDIATELY:  *FEVER GREATER THAN 100.5 F  *CHILLS WITH OR WITHOUT FEVER  NAUSEA AND VOMITING THAT IS NOT CONTROLLED WITH YOUR NAUSEA MEDICATION  *UNUSUAL SHORTNESS OF BREATH  *UNUSUAL BRUISING OR BLEEDING  TENDERNESS IN MOUTH AND THROAT WITH OR WITHOUT PRESENCE OF ULCERS  *URINARY PROBLEMS  *BOWEL PROBLEMS  UNUSUAL RASH Items with * indicate a potential emergency and should be followed up as soon as possible.  Feel free to call the clinic you have any questions or concerns. The clinic phone number is [phone].

## 2018-02-24 ENCOUNTER — Telehealth: Payer: Self-pay

## 2018-02-24 NOTE — Telephone Encounter (Signed)
Per 5/14 no los.

## 2018-02-26 ENCOUNTER — Telehealth: Payer: Self-pay

## 2018-02-26 NOTE — Telephone Encounter (Signed)
Spoke with patient concerning upcoming appointment. I also called her daughter to inform her of these appointments also. NO ANSWER tried to leave a msg and unsure if it went through. Per 5/14 los

## 2018-03-04 ENCOUNTER — Ambulatory Visit: Payer: Medicare Other

## 2018-03-04 ENCOUNTER — Ambulatory Visit: Payer: Medicare Other | Admitting: Internal Medicine

## 2018-03-04 ENCOUNTER — Other Ambulatory Visit: Payer: Medicare Other

## 2018-03-05 ENCOUNTER — Telehealth: Payer: Self-pay | Admitting: Internal Medicine

## 2018-03-05 NOTE — Telephone Encounter (Signed)
Faxed ROI to [facility] on 03/05/18, Release ID [id number]

## 2018-03-09 ENCOUNTER — Other Ambulatory Visit: Payer: Medicare Other

## 2018-03-09 ENCOUNTER — Ambulatory Visit: Payer: Medicare Other | Admitting: Internal Medicine

## 2018-03-10 ENCOUNTER — Ambulatory Visit (HOSPITAL_COMMUNITY)
Admission: RE | Admit: 2018-03-10 | Discharge: 2018-03-10 | Disposition: A | Discharge status: Home / Self Care | Payer: Medicare Other | Source: Ambulatory Visit | Attending: Internal Medicine | Admitting: Internal Medicine

## 2018-03-10 DIAGNOSIS — C711 Malignant neoplasm of frontal lobe: Secondary | ICD-10-CM | POA: Diagnosis not present

## 2018-03-10 MED ORDER — GADOBENATE DIMEGLUMINE 529 MG/ML IV SOLN
20.0000 mL | Freq: Once | INTRAVENOUS | Status: AC | PRN
  Administered 2018-03-10: 4 mL via INTRAVENOUS

## 2018-03-11 ENCOUNTER — Inpatient Hospital Stay: Payer: Medicare Other

## 2018-03-11 ENCOUNTER — Encounter: Payer: Self-pay | Admitting: Internal Medicine

## 2018-03-11 ENCOUNTER — Inpatient Hospital Stay: Payer: Medicare Other | Admitting: Internal Medicine

## 2018-03-11 VITALS — BP 153/83 | HR 82 | Temp 97.7°F | Resp 18 | Ht 65.0 in

## 2018-03-11 DIAGNOSIS — C711 Malignant neoplasm of frontal lobe: Secondary | ICD-10-CM

## 2018-03-11 DIAGNOSIS — Z5112 Encounter for antineoplastic immunotherapy: Secondary | ICD-10-CM | POA: Diagnosis not present

## 2018-03-11 DIAGNOSIS — M6281 Muscle weakness (generalized): Secondary | ICD-10-CM

## 2018-03-11 LAB — CBC (CANCER CENTER ONLY)
HEMATOCRIT: 45.3 % (ref 34.8–46.6)
HEMOGLOBIN: 14.9 g/dL (ref 11.6–15.9)
MCH: 30.1 pg (ref 25.1–34.0)
MCHC: 32.8 g/dL (ref 31.5–36.0)
MCV: 91.6 fL (ref 79.5–101.0)
PLATELETS: 123 10*3/uL — AB (ref 145–400)
RBC: 4.94 MIL/uL (ref 3.70–5.45)
RDW: 16.7 % — ABNORMAL HIGH (ref 11.2–14.5)
WBC: 6.7 10*3/uL (ref 3.9–10.3)

## 2018-03-11 LAB — CMP (CANCER CENTER ONLY)
ALT: 34 U/L (ref 0–55)
ANION GAP: 9 (ref 3–11)
AST: 20 U/L (ref 5–34)
Albumin: 3 g/dL — ABNORMAL LOW (ref 3.5–5.0)
Alkaline Phosphatase: 72 U/L (ref 40–150)
BUN: 23 mg/dL (ref 7–26)
CHLORIDE: 107 mmol/L (ref 98–109)
CO2: 25 mmol/L (ref 22–29)
Calcium: 8.7 mg/dL (ref 8.4–10.4)
Creatinine: 0.65 mg/dL (ref 0.60–1.10)
Glucose, Bld: 215 mg/dL — ABNORMAL HIGH (ref 70–140)
POTASSIUM: 4.3 mmol/L (ref 3.5–5.1)
Sodium: 141 mmol/L (ref 136–145)
TOTAL PROTEIN: 6.1 g/dL — AB (ref 6.4–8.3)
Total Bilirubin: 0.5 mg/dL (ref 0.2–1.2)

## 2018-03-11 LAB — TOTAL PROTEIN, URINE DIPSTICK: Protein, ur: NEGATIVE mg/dL

## 2018-03-11 MED ORDER — BEVACIZUMAB CHEMO INJECTION 400 MG/16ML
5.0000 mg/kg | Freq: Once | INTRAVENOUS | Status: AC
  Administered 2018-03-11: 425 mg via INTRAVENOUS
  Filled 2018-03-11: qty 16

## 2018-03-11 MED ORDER — PRIMIDONE 50 MG PO TABS: 50.0000 mg | ORAL_TABLET | Freq: Every day | ORAL | 5 refills | Status: AC

## 2018-03-11 MED ORDER — SODIUM CHLORIDE 0.9 % IV SOLN
Freq: Once | INTRAVENOUS | Status: AC
  Administered 2018-03-11: 14:00:00 via INTRAVENOUS

## 2018-03-11 NOTE — Patient Instructions (Signed)
[facility] Discharge Instructions for Patients Receiving Chemotherapy  Today you received the following chemotherapy agents; Avastin.   To help prevent nausea and vomiting after your treatment, we encourage you to take your nausea medication as directed.    If you develop nausea and vomiting that is not controlled by your nausea medication, call the clinic.   BELOW ARE SYMPTOMS THAT SHOULD BE REPORTED IMMEDIATELY:  *FEVER GREATER THAN 100.5 F  *CHILLS WITH OR WITHOUT FEVER  NAUSEA AND VOMITING THAT IS NOT CONTROLLED WITH YOUR NAUSEA MEDICATION  *UNUSUAL SHORTNESS OF BREATH  *UNUSUAL BRUISING OR BLEEDING  TENDERNESS IN MOUTH AND THROAT WITH OR WITHOUT PRESENCE OF ULCERS  *URINARY PROBLEMS  *BOWEL PROBLEMS  UNUSUAL RASH Items with * indicate a potential emergency and should be followed up as soon as possible.  Feel free to call the clinic you have any questions or concerns. The clinic phone number is [phone].    

## 2018-03-11 NOTE — Progress Notes (Signed)
[facility] at [place], [place] [address] [phone]   Interval Evaluation  Date of Service: 03/11/18 Patient Name: Donna Valentine Patient MRN: [id number] Patient DOB: Jan 27, 1941 Provider: Ventura Sellers, MD  Identifying Statement:  Donna Valentine is a 77 y.o. female with right frontal glioblastoma   Interval History:  Donna Valentine presents today for Avastin infusion and MRI review.  She seems to be comfortable and "status quo" at the nursing facility.  She is still not ambulatory and is almost full care.  Eats with a weighted fork, but can't use the left hand in any meaningful way.  Family says she is a little "disconnected" and "confused" at times, but maintains sense of humor and still knows what times her favorite shows come on tv.  Otherwise denies seizures or headaches.  MRI was complicated by lost access while administering contrast.   Medications: Current Outpatient Medications on File Prior to Visit  Medication Sig Dispense Refill  . acetaminophen (TYLENOL) 500 MG tablet Take 500-1,000 mg by mouth daily as needed.    Marland Kitchen apixaban (ELIQUIS) 5 MG TABS tablet Take 2 tablets 10 mg by mouth twice a day for  10 days then take 1 tablet  mg by mouth twice a day    . azithromycin (ZITHROMAX) 250 MG tablet Take 1 tablet (250 mg total) by mouth daily. (Patient not taking: Reported on 02/23/2018) 3 each 0  . dexamethasone (DECADRON) 1 MG tablet Take 2 mg by mouth daily with breakfast.     . fluconazole (DIFLUCAN) 100 MG tablet Take 100 mg by mouth daily. For 10days for yeast infection    . levETIRAcetam (KEPPRA) 500 MG tablet Take 1 tablet (500 mg total) by mouth 2 (two) times daily. 60 tablet 5  . nystatin (MYCOSTATIN) 100000 UNIT/ML suspension Take 5 mLs by mouth 3 (three) times daily.    Marland Kitchen nystatin (NYSTATIN) powder Apply 100,000 g topically daily as needed.    . ondansetron (ZOFRAN) 8 MG tablet Take 1 tablet (8 mg total)  by mouth every 8 (eight) hours as needed for nausea. (Patient not taking: Reported on 02/23/2018) 30 tablet 3  . potassium chloride (K-DUR,KLOR-CON) 10 MEQ tablet Take 1 tablet by mouth See admin instructions. Twice a week (Wed/Sat)    . primidone (MYSOLINE) 50 MG tablet Take 50 mg by mouth 2 (two) times daily.     Marland Kitchen senna (SENOKOT) 8.6 MG TABS tablet Take 1 tablet by mouth daily.      No current facility-administered medications on file prior to visit.     Allergies:  Allergies  Allergen Reactions  . Penicillins Other (See Comments)    Hallucination Has patient had a PCN reaction causing immediate rash, facial/tongue/throat swelling, SOB or lightheadedness with hypotension: No Has patient had a PCN reaction causing severe rash involving mucus membranes or skin necrosis: No Has patient had a PCN reaction that required hospitalization No Has patient had a PCN reaction occurring within the last 10 years: No If all of the above answers are "NO", then may proceed with Cephalosporin use.   . Food     Pineapple-Nausea/vomiting   Past Medical History:  Past Medical History:  Diagnosis Date  . GBM (glioblastoma multiforme) ([place])    Past Surgical History:  Past Surgical History:  Procedure Laterality Date  . APPENDECTOMY  1994  . APPLICATION OF CRANIAL NAVIGATION N/A 11/28/2016   Procedure: APPLICATION OF CRANIAL NAVIGATION;  Surgeon: Ashok Pall, MD;  Location: [facility];  Service: Neurosurgery;  Laterality: N/A;  . CESAREAN SECTION     x3  . CRANIOTOMY Right 11/28/2016   Procedure: STERIOTACTIC PARIETAL CRANIOTOMY FOR RIGHT FRONTAL TUMOR RESECTION WITH BRAINLAB;  Surgeon: Ashok Pall, MD;  Location: [facility];  Service: Neurosurgery;  Laterality: Right;   Social History:  Social History   Socioeconomic History  . Marital status: Married    Spouse name: Not on file  . Number of children: Not on file  . Years of education: Not on file  . Highest education level: Not on file  Occupational  History  . Not on file  Social Needs  . Financial resource strain: Not on file  . Food insecurity:    Worry: Not on file    Inability: Not on file  . Transportation needs:    Medical: Not on file    Non-medical: Not on file  Tobacco Use  . Smoking status: Former Smoker    Last attempt to quit: 11/26/1991    Years since quitting: 26.3  . Smokeless tobacco: Never Used  Substance and Sexual Activity  . Alcohol use: No  . Drug use: No  . Sexual activity: Not Currently  Lifestyle  . Physical activity:    Days per week: Not on file    Minutes per session: Not on file  . Stress: Not on file  Relationships  . Social connections:    Talks on phone: Not on file    Gets together: Not on file    Attends religious service: Not on file    Active member of club or organization: Not on file    Attends meetings of clubs or organizations: Not on file    Relationship status: Not on file  . Intimate partner violence:    Fear of current or ex partner: Not on file    Emotionally abused: Not on file    Physically abused: Not on file    Forced sexual activity: Not on file  Other Topics Concern  . Not on file  Social History Narrative  . Not on file   Family History:  Family History  Problem Relation Age of Onset  . Heart disease Mother   . Alzheimer's disease Brother   . Cancer Neg Hx     Review of Systems: Constitutional: mild fatigue Eyes: Denies blurriness of vision Ears, nose, mouth, throat, and face: Denies mucositis or sore throat Respiratory: Denies cough, dyspnea or wheezes Cardiovascular: Denies palpitation, chest discomfort or lower extremity swelling Gastrointestinal:  Denies nausea, constipation, diarrhea GU: no dysuria Skin: Denies abnormal skin rashes Neurological: Per HPI Musculoskeletal: Denies joint pain, back or neck discomfort. No decrease in ROM Behavioral/Psych: Denies anxiety, disturbance in thought content, and mood instability  Physical Exam: Vitals:    03/11/18 1218  BP: (!) 153/83  Pulse: 82  Resp: 18  Temp: 97.7 F (36.5 C)  SpO2: 99%   KPS: 60. General: Drowsy, slumped in wheelchair. Head: Normal EENT: No conjunctival injection or scleral icterus. Oral mucosa moist Lungs: respiratory effort normal  Cardiac: Regular rate and rhythm Abdomen: Soft, non-distended abdomen Skin: No rashes cyanosis or petechiae. Extremities: b/l le edema  Neurologic Exam: Mental Status: Drowsy but easily arouses to talking. Oriented to self and environment. Language is fluent with intact comprehension.  Notable visual spatial neglect on right.  Advanced psychomotor slowing. Cranial Nerves: Visual acuity is grossly normal. Visual fields are full. Extra-ocular movements intact. No ptosis. Face is symmetric, tongue midline. Motor: Tone and  bulk are normal. 2/5 in left arm, 2/5 in left leg. Noted coarse asterixis and easily inducible clonus in arm L>R. Reflexes are symmetric, no pathologic reflexes present. 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  Sensory: Intact to light touch and temperature Gait: Non-ambulatory   Labs: I have reviewed the data as listed    Component Value Date/Time   NA 139 02/23/2018 0909   NA 143 06/16/2017 0917   K 3.9 02/23/2018 0909   K 3.5 06/16/2017 0917   CL 107 02/23/2018 0909   CO2 22 02/23/2018 0909   CO2 29 06/16/2017 0917   GLUCOSE 241 (H) 02/23/2018 0909   GLUCOSE 98 06/16/2017 0917   BUN 23 02/23/2018 0909   BUN 16.1 06/16/2017 0917   CREATININE 0.66 02/23/2018 0909   CREATININE 0.9 06/16/2017 0917   CALCIUM 8.9 02/23/2018 0909   CALCIUM 9.3 06/16/2017 0917   PROT 6.4 02/23/2018 0909   PROT 6.8 06/16/2017 0917   ALBUMIN 3.2 (L) 02/23/2018 0909   ALBUMIN 3.0 (L) 06/16/2017 0917   AST 18 02/23/2018 0909   AST 15 06/16/2017 0917   ALT 31 02/23/2018 0909   ALT 13 06/16/2017 0917   ALKPHOS 67 02/23/2018 0909   ALKPHOS 74 06/16/2017 0917   BILITOT 0.4 02/23/2018 0909   BILITOT 0.44 06/16/2017 0917   GFRNONAA  >60 02/23/2018 0909   GFRAA >60 02/23/2018 0909   Lab Results  Component Value Date   WBC 6.7 03/11/2018   NEUTROABS 4.8 02/08/2018   HGB 14.9 03/11/2018   HCT 45.3 03/11/2018   MCV 91.6 03/11/2018   PLT 123 (L) 03/11/2018    Imaging:  [facility] Clinician Interpretation: I have personally reviewed the CNS images as listed.  My interpretation, in the context of the patient's clinical presentation, is non-enhancing progression  Mr Jeri Cos Wo Contrast  Result Date: 03/10/2018 CLINICAL DATA:  77 year old female with right frontal glioblastoma status post partial resection, postoperative SRS, immunotherapy, and ongoing Avastin therapy after progression. EXAM: MRI HEAD WITHOUT AND WITH CONTRAST TECHNIQUE: Multiplanar, multiecho pulse sequences of the brain and surrounding structures were obtained without and with intravenous contrast. CONTRAST:  47mL MULTIHANCE GADOBENATE DIMEGLUMINE 529 MG/ML IV SOLN The patient's IV access infiltrated during the initial contrast administration, and after 1 or 2 additional attempts at new IV access, the patient declined further attempts. COMPARISON:  Head CT without contrast 02/08/2018. Brain MRI 01/07/2018 and earlier. FINDINGS: Brain: Suboptimal post-contrast imaging today due to the limited volume of contrast (approximately 4 mL) as detailed above. Since 06/02/2017 there has been progressive FLAIR hyperintensity tracking from the residual diffusion restricted masslike area in the right frontal lobe (series 4, image 94) through the right deep gray matter nuclei (series 10 images 27 and 26), and within the right cerebral peduncle (image 20). Patchy and confluent T2 and FLAIR hyperintensity in the pons is stable since August 2018. However, right greater than left temporal lobe periventricular white matter FLAIR hyperintensity is largely new since August (series 10, image 23). Confluent bilateral corona radiata level white matter T2 and FLAIR hyperintensity is stable. The  diffusion restricted and partially enhancing mass appears mildly larger in the craniocaudal dimension since 01/07/2018, up to 44 millimeters now (series 17, image 16), versus 40 millimeters previously-and larger and more rounded within the right corona radiata autumn measuring 44 millimeters diameter there versus 33 millimeters in March (series 10, image 32 today versus series 8, image 34 previously). Mild mass effect on the right lateral ventricle is stable. No midline shift. No  ventriculomegaly. The cerebellum, cervicomedullary junction, and pituitary remain normal. Patent basilar cisterns. No acute intracranial hemorrhage or ischemic appearing diffusion abnormality. Vascular: Major intracranial vascular flow voids are stable. Skull and upper cervical spine: Negative visible cervical spine and spinal cord. Visualized bone marrow signal is within normal limits. Sequelae of right vertex craniotomy again noted. Sinuses/Orbits: Stable and negative. Other: Mastoid air cells remain clear. Visible internal auditory structures appear normal. IMPRESSION: 1. Suboptimal post-contrast imaging today as only 4 mL of contrast could be administered before IV access was lost. 2. Progressive FLAIR abnormality in the right deep gray matter nuclei and midbrain over this series of exams suggests progression of infiltrative tumor, in conjunction with enlargement of the dominant rounded mass within the right frontal lobe which now measures 40 x 44 mm. Electronically Signed   By: Genevie Ann M.D.   On: 03/10/2018 17:22     Assessment/Plan  1. Glioblastoma multiforme of frontal lobe ([place])  2. Left-sided weakness  Ms. Harmon-Dixon has had recent clinical decline which has plateaud/stabilized.  Higher level of care she is receiving at nursing home appears to be beneficial for her and caregivers.  MRI evaluation is limited by lack of contrast administration.  But bulky mass can be appreciated on T2 sequences, and it appears to have  progressed somewhat.  The change in cross-sectional diameter appears less than 25%.  Increased confluence of white matter changes are non-specific but could be tumorigenic infiltration.  Labs are otherwise normal.  We discussed continuing Avastin infusions for palliation, which the patient and her family were supportive of.    Chemotherapy should be held for the following:  ANC less than 500  Platelets less than 50,000  LFT or creatinine greater than 2x ULN  If clinical concerns/contraindications develop  She should return in 2 weeks for next Avastin infusion and set of labs and urine protein.  We recommended repeating MRI in 2 months.  Decadron should be continued at 2mg  daily.  Primidone can be decreased to 50mg  daily if tolerated.  We appreciate the opportunity to participate in the care of [facility].    All questions were answered. The patient knows to call the clinic with any problems, questions or concerns. No barriers to learning were detected.  The total time spent in the encounter was 40 minutes and more than 50% was on counseling and review of test results   Ventura Sellers, MD Medical Director of Neuro-Oncology [facility] at [place] 03/11/18 12:18 PM

## 2018-03-15 ENCOUNTER — Telehealth: Payer: Self-pay

## 2018-03-15 NOTE — Telephone Encounter (Signed)
Received a message From Jerl Mina, [facility] nursing home clinical manager, requesting the most recent MD visit notes and MRI results. Faxed the requested documents and left message that fax confirmation received and to call back with any other questions or concerns.

## 2018-03-24 ENCOUNTER — Telehealth: Payer: Self-pay | Admitting: Medical Oncology

## 2018-03-24 NOTE — Telephone Encounter (Signed)
Family wants pt to come tomorrow to appts and Peggye Fothergill has some information she wants to share with Dr Mickeal Skinner. Call transferred.

## 2018-03-25 ENCOUNTER — Telehealth: Payer: Self-pay | Admitting: Internal Medicine

## 2018-03-25 ENCOUNTER — Inpatient Hospital Stay: Payer: Medicare Other | Attending: Internal Medicine | Admitting: Internal Medicine

## 2018-03-25 ENCOUNTER — Inpatient Hospital Stay: Payer: Medicare Other

## 2018-03-25 VITALS — BP 143/81 | HR 68 | Temp 97.7°F | Resp 17 | Ht 65.0 in

## 2018-03-25 VITALS — BP 155/68

## 2018-03-25 DIAGNOSIS — M6281 Muscle weakness (generalized): Secondary | ICD-10-CM | POA: Diagnosis not present

## 2018-03-25 DIAGNOSIS — R0602 Shortness of breath: Secondary | ICD-10-CM | POA: Insufficient documentation

## 2018-03-25 DIAGNOSIS — R410 Disorientation, unspecified: Secondary | ICD-10-CM | POA: Insufficient documentation

## 2018-03-25 DIAGNOSIS — C711 Malignant neoplasm of frontal lobe: Secondary | ICD-10-CM

## 2018-03-25 DIAGNOSIS — Z87891 Personal history of nicotine dependence: Secondary | ICD-10-CM | POA: Diagnosis not present

## 2018-03-25 DIAGNOSIS — Z5112 Encounter for antineoplastic immunotherapy: Secondary | ICD-10-CM | POA: Diagnosis present

## 2018-03-25 LAB — CBC (CANCER CENTER ONLY)
HCT: 46.2 % (ref 34.8–46.6)
HEMOGLOBIN: 15.3 g/dL (ref 11.6–15.9)
MCH: 30.5 pg (ref 25.1–34.0)
MCHC: 33.1 g/dL (ref 31.5–36.0)
MCV: 92.3 fL (ref 79.5–101.0)
Platelet Count: 147 10*3/uL (ref 145–400)
RBC: 5.01 MIL/uL (ref 3.70–5.45)
RDW: 17.6 % — ABNORMAL HIGH (ref 11.2–14.5)
WBC Count: 7.4 10*3/uL (ref 3.9–10.3)

## 2018-03-25 LAB — CMP (CANCER CENTER ONLY)
ALBUMIN: 3.3 g/dL — AB (ref 3.5–5.0)
ALK PHOS: 71 U/L (ref 40–150)
ALT: 28 U/L (ref 0–55)
ANION GAP: 10 (ref 3–11)
AST: 19 U/L (ref 5–34)
BUN: 23 mg/dL (ref 7–26)
CALCIUM: 9.4 mg/dL (ref 8.4–10.4)
CHLORIDE: 105 mmol/L (ref 98–109)
CO2: 26 mmol/L (ref 22–29)
CREATININE: 0.61 mg/dL (ref 0.60–1.10)
GFR, Estimated: >60 mL/min (ref >60)
GLUCOSE: 117 mg/dL (ref 70–140)
Potassium: 3.9 mmol/L (ref 3.5–5.1)
SODIUM: 141 mmol/L (ref 136–145)
Total Bilirubin: 0.5 mg/dL (ref 0.2–1.2)
Total Protein: 6.5 g/dL (ref 6.4–8.3)

## 2018-03-25 MED ORDER — SODIUM CHLORIDE 0.9 % IV SOLN
Freq: Once | INTRAVENOUS | Status: AC
  Administered 2018-03-25: 11:00:00 via INTRAVENOUS

## 2018-03-25 MED ORDER — SODIUM CHLORIDE 0.9 % IV SOLN
5.0000 mg/kg | Freq: Once | INTRAVENOUS | Status: AC
  Administered 2018-03-25: 425 mg via INTRAVENOUS
  Filled 2018-03-25: qty 16

## 2018-03-25 NOTE — Telephone Encounter (Signed)
Appointment scheduled AVS/Calendar printed per 6/13 los

## 2018-03-25 NOTE — Progress Notes (Signed)
Ok to treat Avastin 03/25/2018 without urine protein per Dr Mickeal Skinner.

## 2018-03-25 NOTE — Progress Notes (Signed)
[facility] at [place], [place] [address] [phone]   Interval Evaluation  Date of Service: 03/25/18 Patient Name: Donna Valentine Patient MRN: [id number] Patient DOB: 09-Sep-1941 Provider: Ventura Sellers, MD  Identifying Statement:  Codee Bloodworth is a 77 y.o. female with right frontal glioblastoma   Interval History:  Donna Valentine presents today for Avastin infusion.  No new or progressive deficits described.  She is still not ambulatory and is almost full care.  At rehab/nursing facility, requiring assistance even with eating.  Still enjoying her favorite shows on tv.  Otherwise denies seizures or headaches.     Medications: Current Outpatient Medications on File Prior to Visit  Medication Sig Dispense Refill  . acetaminophen (TYLENOL) 500 MG tablet Take 500-1,000 mg by mouth daily as needed.    Marland Kitchen apixaban (ELIQUIS) 5 MG TABS tablet Take 2 tablets 10 mg by mouth twice a day for  10 days then take 1 tablet  mg by mouth twice a day    . dexamethasone (DECADRON) 1 MG tablet Take 2 mg by mouth daily with breakfast.     . levETIRAcetam (KEPPRA) 500 MG tablet Take 1 tablet (500 mg total) by mouth 2 (two) times daily. 60 tablet 5  . nystatin (MYCOSTATIN) 100000 UNIT/ML suspension Take 5 mLs by mouth 3 (three) times daily.    Marland Kitchen nystatin (NYSTATIN) powder Apply 100,000 g topically daily as needed.    . ondansetron (ZOFRAN) 8 MG tablet Take 1 tablet (8 mg total) by mouth every 8 (eight) hours as needed for nausea. (Patient not taking: Reported on 02/23/2018) 30 tablet 3  . polyethylene glycol (MIRALAX / GLYCOLAX) packet Take 17 g by mouth daily as needed.    . potassium chloride (K-DUR,KLOR-CON) 10 MEQ tablet Take 1 tablet by mouth See admin instructions. Twice a week (Wed/Sat)    . primidone (MYSOLINE) 50 MG tablet Take 1 tablet (50 mg total) by mouth daily. 30 tablet 5  . senna (SENOKOT) 8.6 MG TABS tablet Take 1 tablet by  mouth daily.      No current facility-administered medications on file prior to visit.     Allergies:  Allergies  Allergen Reactions  . Penicillins Other (See Comments)    Hallucination Has patient had a PCN reaction causing immediate rash, facial/tongue/throat swelling, SOB or lightheadedness with hypotension: No Has patient had a PCN reaction causing severe rash involving mucus membranes or skin necrosis: No Has patient had a PCN reaction that required hospitalization No Has patient had a PCN reaction occurring within the last 10 years: No If all of the above answers are "NO", then may proceed with Cephalosporin use.   . Food     Pineapple-Nausea/vomiting   Past Medical History:  Past Medical History:  Diagnosis Date  . GBM (glioblastoma multiforme) ([place])    Past Surgical History:  Past Surgical History:  Procedure Laterality Date  . APPENDECTOMY  1994  . APPLICATION OF CRANIAL NAVIGATION N/A 11/28/2016   Procedure: APPLICATION OF CRANIAL NAVIGATION;  Surgeon: Ashok Pall, MD;  Location: [facility];  Service: Neurosurgery;  Laterality: N/A;  . CESAREAN SECTION     x3  . CRANIOTOMY Right 11/28/2016   Procedure: STERIOTACTIC PARIETAL CRANIOTOMY FOR RIGHT FRONTAL TUMOR RESECTION WITH BRAINLAB;  Surgeon: Ashok Pall, MD;  Location: [facility];  Service: Neurosurgery;  Laterality: Right;   Social History:  Social History   Socioeconomic History  . Marital status: Married    Spouse  name: Not on file  . Number of children: Not on file  . Years of education: Not on file  . Highest education level: Not on file  Occupational History  . Not on file  Social Needs  . Financial resource strain: Not on file  . Food insecurity:    Worry: Not on file    Inability: Not on file  . Transportation needs:    Medical: Not on file    Non-medical: Not on file  Tobacco Use  . Smoking status: Former Smoker    Last attempt to quit: 11/26/1991    Years since quitting: 26.3  . Smokeless tobacco:  Never Used  Substance and Sexual Activity  . Alcohol use: No  . Drug use: No  . Sexual activity: Not Currently  Lifestyle  . Physical activity:    Days per week: Not on file    Minutes per session: Not on file  . Stress: Not on file  Relationships  . Social connections:    Talks on phone: Not on file    Gets together: Not on file    Attends religious service: Not on file    Active member of club or organization: Not on file    Attends meetings of clubs or organizations: Not on file    Relationship status: Not on file  . Intimate partner violence:    Fear of current or ex partner: Not on file    Emotionally abused: Not on file    Physically abused: Not on file    Forced sexual activity: Not on file  Other Topics Concern  . Not on file  Social History Narrative  . Not on file   Family History:  Family History  Problem Relation Age of Onset  . Heart disease Mother   . Alzheimer's disease Brother   . Cancer Neg Hx     Review of Systems: Constitutional: mild fatigue Eyes: Denies blurriness of vision Ears, nose, mouth, throat, and face: Denies mucositis or sore throat Respiratory: Denies cough, dyspnea or wheezes Cardiovascular: Denies palpitation, chest discomfort or lower extremity swelling Gastrointestinal:  Denies nausea, constipation, diarrhea GU: no dysuria Skin: Denies abnormal skin rashes Neurological: Per HPI Musculoskeletal: Denies joint pain, back or neck discomfort. No decrease in ROM Behavioral/Psych: Denies anxiety, disturbance in thought content, and mood instability  Physical Exam: Vitals:   03/25/18 0937  BP: (!) 143/81  Pulse: 68  Resp: 17  Temp: 97.7 F (36.5 C)  SpO2: 99%   KPS: 60. General: Drowsy, slumped in wheelchair. Head: Normal EENT: No conjunctival injection or scleral icterus. Oral mucosa moist Lungs: respiratory effort normal  Cardiac: Regular rate and rhythm Abdomen: Soft, non-distended abdomen Skin: No rashes cyanosis or  petechiae. Extremities: b/l le edema  Neurologic Exam: Mental Status: Drowsy but easily arouses to talking. Oriented to self and environment. Language is fluent with intact comprehension.  Notable visual spatial neglect on right.  Advanced psychomotor slowing. Cranial Nerves: Visual acuity is grossly normal. Visual fields are full. Extra-ocular movements intact. No ptosis. Face is symmetric, tongue midline. Motor: Tone and bulk are normal. 2/5 in left arm, 2/5 in left leg. Noted coarse asterixis and easily inducible clonus in arm L>R. Reflexes are symmetric, no pathologic reflexes present. 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  Sensory: Intact to light touch and temperature Gait: Non-ambulatory   Labs: I have reviewed the data as listed    Component Value Date/Time   NA 141 03/11/2018 1124   NA 143 06/16/2017 0917  K 4.3 03/11/2018 1124   K 3.5 06/16/2017 0917   CL 107 03/11/2018 1124   CO2 25 03/11/2018 1124   CO2 29 06/16/2017 0917   GLUCOSE 215 (H) 03/11/2018 1124   GLUCOSE 98 06/16/2017 0917   BUN 23 03/11/2018 1124   BUN 16.1 06/16/2017 0917   CREATININE 0.65 03/11/2018 1124   CREATININE 0.9 06/16/2017 0917   CALCIUM 8.7 03/11/2018 1124   CALCIUM 9.3 06/16/2017 0917   PROT 6.1 (L) 03/11/2018 1124   PROT 6.8 06/16/2017 0917   ALBUMIN 3.0 (L) 03/11/2018 1124   ALBUMIN 3.0 (L) 06/16/2017 0917   AST 20 03/11/2018 1124   AST 15 06/16/2017 0917   ALT 34 03/11/2018 1124   ALT 13 06/16/2017 0917   ALKPHOS 72 03/11/2018 1124   ALKPHOS 74 06/16/2017 0917   BILITOT 0.5 03/11/2018 1124   BILITOT 0.44 06/16/2017 0917   GFRNONAA >60 03/11/2018 1124   GFRAA >60 03/11/2018 1124   Lab Results  Component Value Date   WBC 7.4 03/25/2018   NEUTROABS 4.8 02/08/2018   HGB 15.3 03/25/2018   HCT 46.2 03/25/2018   MCV 92.3 03/25/2018   PLT 147 03/25/2018     Assessment/Plan  1. Glioblastoma multiforme of frontal lobe ([place])  2. Left-sided weakness  Ms. Harmon-Dixon is clinically  stable today.  We re-emphasized today the palliative nature of Avastin at this point in her care plan.  The treatments may be slowing the growth of the tumor or treating local inflammation but her disease has progressed through the therapy.  Our hope is that Avastin is providing some symptomatic relief, and only in this context would be consider continuation of this level of care.  We also discussed her code status at length; family declined to make any decisions at this time.   Chemotherapy should be held for the following:  ANC less than 500  Platelets less than 50,000  LFT or creatinine greater than 2x ULN  If clinical concerns/contraindications develop  She should return in 2 weeks for next Avastin infusion and set of labs and urine protein.  We recommended repeating MRI in 4-6 weeks.  Decadron should be continued at 2mg  daily.  We appreciate the opportunity to participate in the care of [facility].    All questions were answered. The patient knows to call the clinic with any problems, questions or concerns. No barriers to learning were detected.  The total time spent in the encounter was 25 minutes and more than 50% was on counseling and review of test results   Ventura Sellers, MD Medical Director of Neuro-Oncology [facility] at [place] 03/25/18 9:41 AM

## 2018-03-25 NOTE — Patient Instructions (Signed)
[facility] Discharge Instructions for Patients Receiving Chemotherapy  Today you received the following chemotherapy agents Avastin  To help prevent nausea and vomiting after your treatment, we encourage you to take your nausea medication as directed   If you develop nausea and vomiting that is not controlled by your nausea medication, call the clinic.   BELOW ARE SYMPTOMS THAT SHOULD BE REPORTED IMMEDIATELY:  *FEVER GREATER THAN 100.5 F  *CHILLS WITH OR WITHOUT FEVER  NAUSEA AND VOMITING THAT IS NOT CONTROLLED WITH YOUR NAUSEA MEDICATION  *UNUSUAL SHORTNESS OF BREATH  *UNUSUAL BRUISING OR BLEEDING  TENDERNESS IN MOUTH AND THROAT WITH OR WITHOUT PRESENCE OF ULCERS  *URINARY PROBLEMS  *BOWEL PROBLEMS  UNUSUAL RASH Items with * indicate a potential emergency and should be followed up as soon as possible.  Feel free to call the clinic should you have any questions or concerns. The clinic phone number is [phone].  Please show the CHEMO ALERT CARD at check-in to the Emergency Department and triage nurse.   

## 2018-03-29 ENCOUNTER — Other Ambulatory Visit: Payer: Self-pay | Admitting: *Deleted

## 2018-03-30 ENCOUNTER — Other Ambulatory Visit: Payer: Self-pay | Admitting: *Deleted

## 2018-03-30 ENCOUNTER — Telehealth: Payer: Self-pay | Admitting: *Deleted

## 2018-03-30 DIAGNOSIS — C711 Malignant neoplasm of frontal lobe: Secondary | ICD-10-CM

## 2018-03-30 NOTE — Telephone Encounter (Signed)
Patient is due to leave the facility and be transferred home coming this 04/02/2018

## 2018-03-30 NOTE — Telephone Encounter (Signed)
Patient is being discharged home from facility 04/02/2018 due to insurance maximum length of stay achieved.  Family was going home with Hospice services but upon talking to Barnett Applebaum daughter she wasn't aware that patient could no longer receive treatment while under Hospice.  She will talk to Harper to get more information and clarificiation.

## 2018-03-31 ENCOUNTER — Telehealth: Payer: Self-pay

## 2018-03-31 NOTE — Telephone Encounter (Signed)
Received a call from Donna Valentine stating that the family is not going to pursue hospice care at this time. She does not want any appointments to be cancelled at this time and has further questions for Dr. Mickeal Skinner at the next appointment. She stated that the patient will remain at the nursing facility at this time as well.

## 2018-04-08 ENCOUNTER — Inpatient Hospital Stay: Payer: Medicare Other

## 2018-04-08 ENCOUNTER — Encounter: Payer: Self-pay | Admitting: Internal Medicine

## 2018-04-08 ENCOUNTER — Inpatient Hospital Stay: Payer: Medicare Other | Admitting: Internal Medicine

## 2018-04-08 VITALS — BP 136/78 | HR 92 | Temp 98.2°F | Resp 17 | Ht 65.0 in

## 2018-04-08 DIAGNOSIS — R0602 Shortness of breath: Secondary | ICD-10-CM

## 2018-04-08 DIAGNOSIS — Z5112 Encounter for antineoplastic immunotherapy: Secondary | ICD-10-CM | POA: Diagnosis not present

## 2018-04-08 DIAGNOSIS — Z87891 Personal history of nicotine dependence: Secondary | ICD-10-CM

## 2018-04-08 DIAGNOSIS — M6281 Muscle weakness (generalized): Secondary | ICD-10-CM

## 2018-04-08 DIAGNOSIS — R410 Disorientation, unspecified: Secondary | ICD-10-CM

## 2018-04-08 DIAGNOSIS — C711 Malignant neoplasm of frontal lobe: Secondary | ICD-10-CM | POA: Diagnosis not present

## 2018-04-08 LAB — CMP (CANCER CENTER ONLY)
ALK PHOS: 70 U/L (ref 38–126)
ALT: 30 U/L (ref 0–44)
AST: 19 U/L (ref 15–41)
Albumin: 3 g/dL — ABNORMAL LOW (ref 3.5–5.0)
Anion gap: 8 (ref 5–15)
BUN: 28 mg/dL — AB (ref 8–23)
CALCIUM: 8.9 mg/dL (ref 8.9–10.3)
CO2: 27 mmol/L (ref 22–32)
Chloride: 106 mmol/L (ref 98–111)
Creatinine: 0.57 mg/dL (ref 0.44–1.00)
GFR, Est AFR Am: >60 mL/min (ref >60)
GFR, Estimated: >60 mL/min (ref >60)
Glucose, Bld: 154 mg/dL — ABNORMAL HIGH (ref 70–99)
Potassium: 3.8 mmol/L (ref 3.5–5.1)
SODIUM: 141 mmol/L (ref 135–145)
Total Bilirubin: 0.7 mg/dL (ref 0.3–1.2)
Total Protein: 6 g/dL — ABNORMAL LOW (ref 6.5–8.1)

## 2018-04-08 LAB — CBC (CANCER CENTER ONLY)
HCT: 46.4 % (ref 34.8–46.6)
Hemoglobin: 15.2 g/dL (ref 11.6–15.9)
MCH: 31.3 pg (ref 25.1–34.0)
MCHC: 32.8 g/dL (ref 31.5–36.0)
MCV: 95.7 fL (ref 79.5–101.0)
Platelet Count: 110 10*3/uL — ABNORMAL LOW (ref 145–400)
RBC: 4.85 MIL/uL (ref 3.70–5.45)
RDW: 15.8 % — ABNORMAL HIGH (ref 11.2–14.5)
WBC Count: 7.3 10*3/uL (ref 3.9–10.3)

## 2018-04-08 NOTE — Progress Notes (Signed)
[facility] at [place], [place] [address] [phone]   Interval Evaluation  Date of Service: 04/08/18 Patient Name: Donna Valentine Patient MRN: [id number] Patient DOB: 1941/08/22 Provider: Ventura Sellers, MD  Identifying Statement:  Donna Valentine is a 77 y.o. female with right frontal glioblastoma   Interval History:  Donna Valentine presents today for routine follow up.  Although she has no complaints today, her daughter describes days of increased confusion and even shortness of breath.  These "bad days" have become more frequent.  She still requires full care for all ADLs, and is non-ambulatory.  Tomorrow she is scheduled to be discharged to home from rehab facility.   Medications: Current Outpatient Medications on File Prior to Visit  Medication Sig Dispense Refill  . acetaminophen (TYLENOL) 500 MG tablet Take 500-1,000 mg by mouth daily as needed.    Marland Kitchen apixaban (ELIQUIS) 5 MG TABS tablet Take 2 tablets 10 mg by mouth twice a day for  10 days then take 1 tablet  mg by mouth twice a day    . dexamethasone (DECADRON) 1 MG tablet Take 2 mg by mouth daily with breakfast.     . levETIRAcetam (KEPPRA) 500 MG tablet Take 1 tablet (500 mg total) by mouth 2 (two) times daily. 60 tablet 5  . nystatin (MYCOSTATIN) 100000 UNIT/ML suspension Take 5 mLs by mouth 3 (three) times daily.    Marland Kitchen nystatin (NYSTATIN) powder Apply 100,000 g topically daily as needed.    . ondansetron (ZOFRAN) 8 MG tablet Take 1 tablet (8 mg total) by mouth every 8 (eight) hours as needed for nausea. (Patient not taking: Reported on 02/23/2018) 30 tablet 3  . polyethylene glycol (MIRALAX / GLYCOLAX) packet Take 17 g by mouth daily as needed.    . potassium chloride (K-DUR,KLOR-CON) 10 MEQ tablet Take 1 tablet by mouth See admin instructions. Twice a week (Wed/Sat)    . primidone (MYSOLINE) 50 MG tablet Take 1 tablet (50 mg total) by mouth daily. 30 tablet 5    . senna (SENOKOT) 8.6 MG TABS tablet Take 1 tablet by mouth daily.      No current facility-administered medications on file prior to visit.     Allergies:  Allergies  Allergen Reactions  . Penicillins Other (See Comments)    Hallucination Has patient had a PCN reaction causing immediate rash, facial/tongue/throat swelling, SOB or lightheadedness with hypotension: No Has patient had a PCN reaction causing severe rash involving mucus membranes or skin necrosis: No Has patient had a PCN reaction that required hospitalization No Has patient had a PCN reaction occurring within the last 10 years: No If all of the above answers are "NO", then may proceed with Cephalosporin use.   . Food     Pineapple-Nausea/vomiting   Past Medical History:  Past Medical History:  Diagnosis Date  . GBM (glioblastoma multiforme) ([place])    Past Surgical History:  Past Surgical History:  Procedure Laterality Date  . APPENDECTOMY  1994  . APPLICATION OF CRANIAL NAVIGATION N/A 11/28/2016   Procedure: APPLICATION OF CRANIAL NAVIGATION;  Surgeon: Ashok Pall, MD;  Location: [facility];  Service: Neurosurgery;  Laterality: N/A;  . CESAREAN SECTION     x3  . CRANIOTOMY Right 11/28/2016   Procedure: STERIOTACTIC PARIETAL CRANIOTOMY FOR RIGHT FRONTAL TUMOR RESECTION WITH BRAINLAB;  Surgeon: Ashok Pall, MD;  Location: [facility];  Service: Neurosurgery;  Laterality: Right;   Social History:  Social History  Socioeconomic History  . Marital status: Married    Spouse name: Not on file  . Number of children: Not on file  . Years of education: Not on file  . Highest education level: Not on file  Occupational History  . Not on file  Social Needs  . Financial resource strain: Not on file  . Food insecurity:    Worry: Not on file    Inability: Not on file  . Transportation needs:    Medical: Not on file    Non-medical: Not on file  Tobacco Use  . Smoking status: Former Smoker    Last attempt to quit: 11/26/1991     Years since quitting: 26.3  . Smokeless tobacco: Never Used  Substance and Sexual Activity  . Alcohol use: No  . Drug use: No  . Sexual activity: Not Currently  Lifestyle  . Physical activity:    Days per week: Not on file    Minutes per session: Not on file  . Stress: Not on file  Relationships  . Social connections:    Talks on phone: Not on file    Gets together: Not on file    Attends religious service: Not on file    Active member of club or organization: Not on file    Attends meetings of clubs or organizations: Not on file    Relationship status: Not on file  . Intimate partner violence:    Fear of current or ex partner: Not on file    Emotionally abused: Not on file    Physically abused: Not on file    Forced sexual activity: Not on file  Other Topics Concern  . Not on file  Social History Narrative  . Not on file   Family History:  Family History  Problem Relation Age of Onset  . Heart disease Mother   . Alzheimer's disease Brother   . Cancer Neg Hx     Review of Systems: Limited by confusion  Physical Exam: Vitals:   04/08/18 1021  BP: 136/78  Pulse: 92  Resp: 17  Temp: 98.2 F (36.8 C)  SpO2: 98%   KPS: 60. General: Drowsy, slumped in wheelchair. Head: Normal EENT: No conjunctival injection or scleral icterus. Oral mucosa moist Lungs: respiratory effort normal  Cardiac: Regular rate and rhythm Abdomen: Soft, non-distended abdomen Skin: No rashes cyanosis or petechiae. Extremities: b/l le edema  Neurologic Exam: Mental Status: Drowsy but easily arouses to talking. Oriented to self and environment. Language is fluent with intact comprehension.  Notable visual spatial neglect on right.  Advanced psychomotor slowing. Cranial Nerves: Visual acuity is grossly normal. Visual fields are full. Extra-ocular movements intact. No ptosis. Face is symmetric, tongue midline. Motor: Tone and bulk are normal. 2/5 in left arm, 2/5 in left leg. Noted coarse  asterixis and easily inducible clonus in arm L>R. Reflexes are symmetric, no pathologic reflexes present. 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  Sensory: Intact to light touch and temperature Gait: Non-ambulatory   Labs: I have reviewed the data as listed    Component Value Date/Time   NA 141 03/25/2018 0910   NA 143 06/16/2017 0917   K 3.9 03/25/2018 0910   K 3.5 06/16/2017 0917   CL 105 03/25/2018 0910   CO2 26 03/25/2018 0910   CO2 29 06/16/2017 0917   GLUCOSE 117 03/25/2018 0910   GLUCOSE 98 06/16/2017 0917   BUN 23 03/25/2018 0910   BUN 16.1 06/16/2017 0917   CREATININE 0.61 03/25/2018 0910  CREATININE 0.9 06/16/2017 0917   CALCIUM 9.4 03/25/2018 0910   CALCIUM 9.3 06/16/2017 0917   PROT 6.5 03/25/2018 0910   PROT 6.8 06/16/2017 0917   ALBUMIN 3.3 (L) 03/25/2018 0910   ALBUMIN 3.0 (L) 06/16/2017 0917   AST 19 03/25/2018 0910   AST 15 06/16/2017 0917   ALT 28 03/25/2018 0910   ALT 13 06/16/2017 0917   ALKPHOS 71 03/25/2018 0910   ALKPHOS 74 06/16/2017 0917   BILITOT 0.5 03/25/2018 0910   BILITOT 0.44 06/16/2017 0917   GFRNONAA >60 03/25/2018 0910   GFRAA >60 03/25/2018 0910   Lab Results  Component Value Date   WBC 7.3 04/08/2018   NEUTROABS 4.8 02/08/2018   HGB 15.2 04/08/2018   HCT 46.4 04/08/2018   MCV 95.7 04/08/2018   PLT 110 (L) 04/08/2018     Assessment/Plan  1. Glioblastoma multiforme of frontal lobe ([place])  2. Left-sided weakness  Donna Valentine has experienced further clinical decline.  I do not believe she would benefit from further Avastin at this time.  Today we discussed options moving forward including hospice/palliative care.  We did offer alternative which is to obtain an MRI to reassess tumor progression, but they understand this is very unlikely to lead to change in intervention.    They will continue to engage in this conversation with additional family members not present, and contact us with their decision.   We appreciate the  opportunity to participate in the care of [facility].    The total time spent in the encounter was 40 minutes and more than 50% was on counseling and review of test results   Ventura Sellers, MD Medical Director of Neuro-Oncology [facility] at [place] 04/08/18 10:08 AM

## 2018-04-08 NOTE — Progress Notes (Signed)
Per Daughter Barnett Applebaum patient is scheduled to be discharged from facility 04/09/2018 tomorrow and going home with [place] and hospital bed being delivered.

## 2018-04-09 ENCOUNTER — Telehealth: Payer: Self-pay

## 2018-04-09 NOTE — Telephone Encounter (Signed)
Per 6/27 no los

## 2018-04-12 ENCOUNTER — Telehealth: Payer: Self-pay | Admitting: *Deleted

## 2018-04-12 NOTE — Telephone Encounter (Signed)
Per dr Mickeal Skinner, spoke with sara at [facility]. Gave verbal order. Okay for home health visit by a nurse 1 x a week for 5 weeks, and twice weekly for 3 weeks.

## 2018-04-14 ENCOUNTER — Telehealth: Payer: Self-pay | Admitting: *Deleted

## 2018-04-14 NOTE — Telephone Encounter (Signed)
Verbal order given for speech/swallowing therapy.

## 2018-04-19 ENCOUNTER — Telehealth: Payer: Self-pay | Admitting: *Deleted

## 2018-04-19 ENCOUNTER — Other Ambulatory Visit: Payer: Self-pay | Admitting: Internal Medicine

## 2018-04-19 DIAGNOSIS — C711 Malignant neoplasm of frontal lobe: Secondary | ICD-10-CM

## 2018-04-19 NOTE — Telephone Encounter (Signed)
"  Shirlee Latch, [facility] Speech Therapist calling about Dr. Renda Rolls patient."  Message forwarded to collaborative for further communication.

## 2018-04-19 NOTE — Telephone Encounter (Signed)
Per Dr Mickeal Skinner cancel 04/22/18 appts.  He wants patient to have MRI before she is seen again.  LM for Barnett Applebaum to advise.

## 2018-04-20 ENCOUNTER — Telehealth: Payer: Self-pay | Admitting: *Deleted

## 2018-04-20 NOTE — Telephone Encounter (Signed)
[facility] orders received for Home health nurse aide, evaluation and treatment for swallowing and aspirations precautions.  Approval given via fax and phone to Dawayne Cirri [phone] with [facility]

## 2018-04-22 ENCOUNTER — Telehealth: Payer: Self-pay | Admitting: *Deleted

## 2018-04-22 ENCOUNTER — Inpatient Hospital Stay: Payer: Medicare Other

## 2018-04-22 ENCOUNTER — Inpatient Hospital Stay: Payer: Medicare Other | Admitting: Internal Medicine

## 2018-04-22 ENCOUNTER — Other Ambulatory Visit: Payer: Self-pay | Admitting: Internal Medicine

## 2018-04-22 ENCOUNTER — Other Ambulatory Visit: Payer: Self-pay | Admitting: *Deleted

## 2018-04-22 DIAGNOSIS — C711 Malignant neoplasm of frontal lobe: Secondary | ICD-10-CM

## 2018-04-22 MED ORDER — LEVETIRACETAM 500 MG PO TABS: 1000.0000 mg | ORAL_TABLET | Freq: Two times a day (BID) | ORAL | 5 refills | Status: AC

## 2018-04-22 NOTE — Telephone Encounter (Signed)
Daughter Barnett Applebaum called requesting DNR certificate for the home.  She states that since speaking with Dr Mickeal Skinner in detail they have decided to proceed with DNR and [facility] since patient is declining.  Form completed and available for pick up.    Barnett Applebaum stated that Cassandra from [facility] should be calling us soon for provider of care during her duration under Hospice.

## 2018-04-22 NOTE — Progress Notes (Signed)
Per Dr Mickeal Skinner increase Keppra to 1000mg  BID due to recent seizure.

## 2018-04-22 NOTE — Telephone Encounter (Signed)
Faxed records to [facility] of [place].  She advised that they would get out to do the initial evaluation at the latest tomorrow.

## 2018-04-23 ENCOUNTER — Telehealth: Payer: Self-pay

## 2018-04-23 ENCOUNTER — Other Ambulatory Visit: Payer: Self-pay | Admitting: Internal Medicine

## 2018-04-23 MED ORDER — LORAZEPAM 2 MG/ML IJ SOLN: 2.0000 mg | Freq: Three times a day (TID) | INTRAMUSCULAR | 0 refills | Status: AC | PRN

## 2018-04-23 NOTE — Telephone Encounter (Signed)
Received VM from pt Hospice nurse, Beth, regarding medications. Discussed with Dr. Mickeal Skinner. Pt okay to stop eliquis as this was given in r/t chemotherapy which has been stopped. Request for refill of lorazepam 2mg /mL to be sent to [facility]. Medication reordered by MD. Caren Macadam at [phone] to relay changes. RN verbalized understanding.

## 2018-04-27 ENCOUNTER — Telehealth: Payer: Self-pay | Admitting: *Deleted

## 2018-04-27 NOTE — Telephone Encounter (Signed)
rec'd documentation of admission to [facility].  Nurse [facility]   Physicians certification of terminal illness completed & faxed to [phone]

## 2018-04-29 ENCOUNTER — Ambulatory Visit (HOSPITAL_COMMUNITY): Payer: Medicare Other

## 2018-05-04 ENCOUNTER — Telehealth: Payer: Self-pay | Admitting: *Deleted

## 2018-05-04 NOTE — Telephone Encounter (Signed)
Received call from [facility] that patient deceased as of 05/06/18.   Also received notification from [facility] that patient deceased.

## 2018-05-13 DEATH — deceased

## 2018-08-17 IMAGING — MR MR HEAD WO/W CM
7 of 13 series · 29 of 48 positions shown · IV contrast (15ml Multihance)
Comparison: CT HEAD November 29, 2016 and MRI of the [place] [facility]

CLINICAL DATA: Followup glioblastoma, status post surgery November 28, 2016. Pending chemo radiation therapy.

EXAM:
MRI HEAD WITHOUT AND WITH CONTRAST
TECHNIQUE: Multiplanar, multiecho pulse sequences of the brain and surrounding
structures were obtained without and with intravenous contrast.
CONTRAST:  15mL MULTIHANCE GADOBENATE DIMEGLUMINE 529 MG/ML IV SOLN

[Series 3: DWI · axial · 3.0mm · 0.72mm/px · z∈[-61,+100]mm · 4 of 55 slices shown (1 of 4)]
[im 1/55]
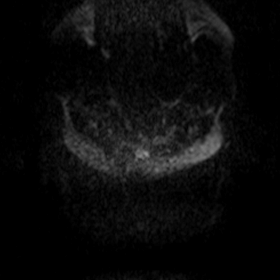
[im 19/55]
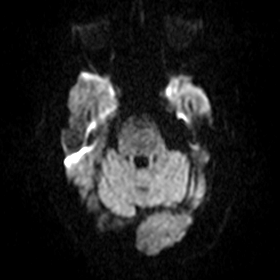
[im 37/55]
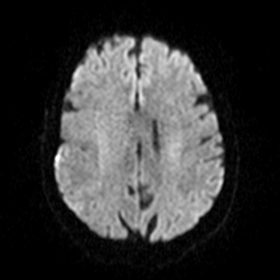
[im 55/55]
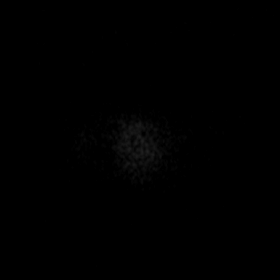

[Series 4: DWI · axial · 3.0mm · 0.72mm/px · z∈[-61,+100]mm · 5 of 55 slices shown (2 of 4)]
[im 1/55]
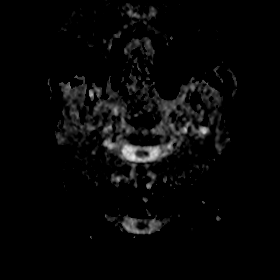
[im 14/55]
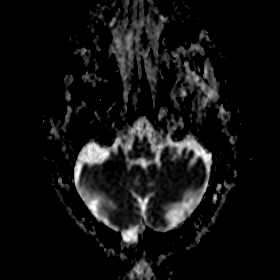
[im 28/55]
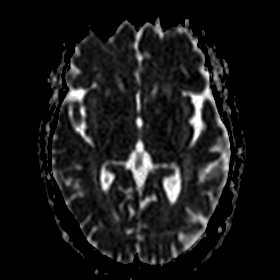
[im 41/55]
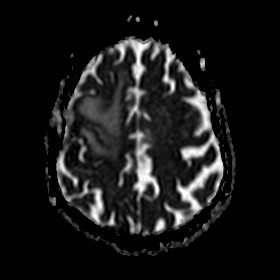
[im 55/55]
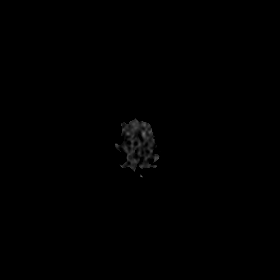

[Series 5: DWI · coronal · 5.0mm · 0.45mm/px · 4 of 38 slices shown (3 of 4)]
[im 1/38]
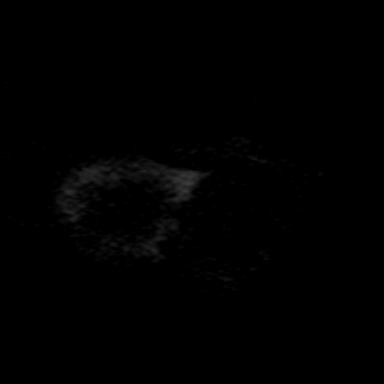
[im 13/38]
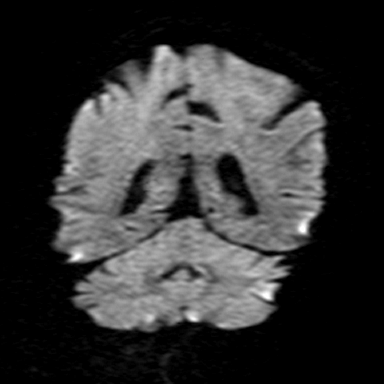
[im 25/38]
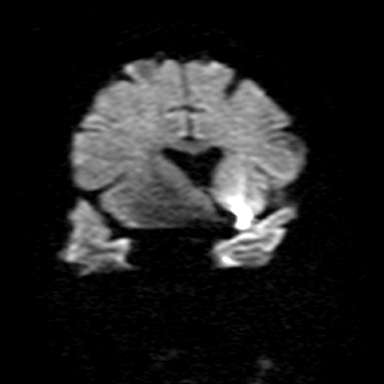
[im 38/38]
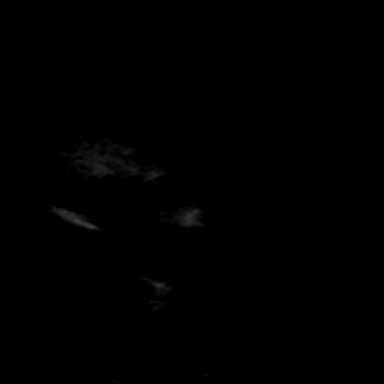

[Series 6: DWI · coronal · 5.0mm · 0.45mm/px · 4 of 38 slices shown (4 of 4)]
[im 1/38]
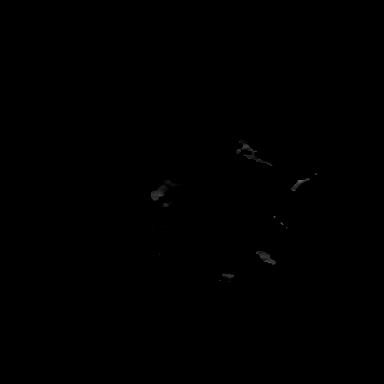
[im 13/38]
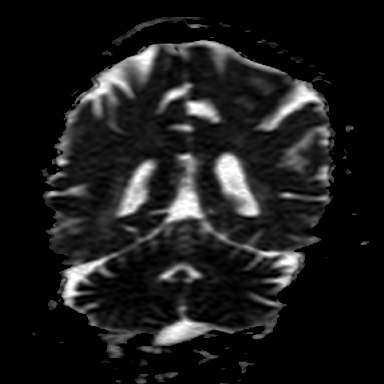
[im 25/38]
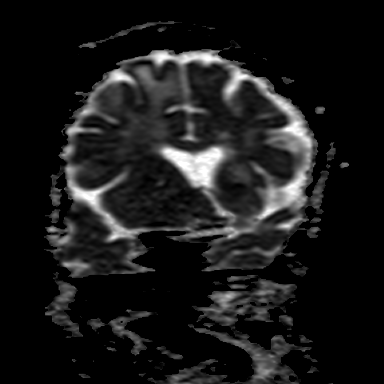
[im 38/38]
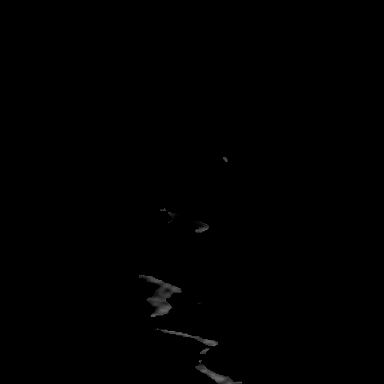

[Series 9: FLAIR · axial · 5.0mm · 0.35mm/px · z∈[-51,+90]mm · 2 of 23 slices shown]
[im 1/23]
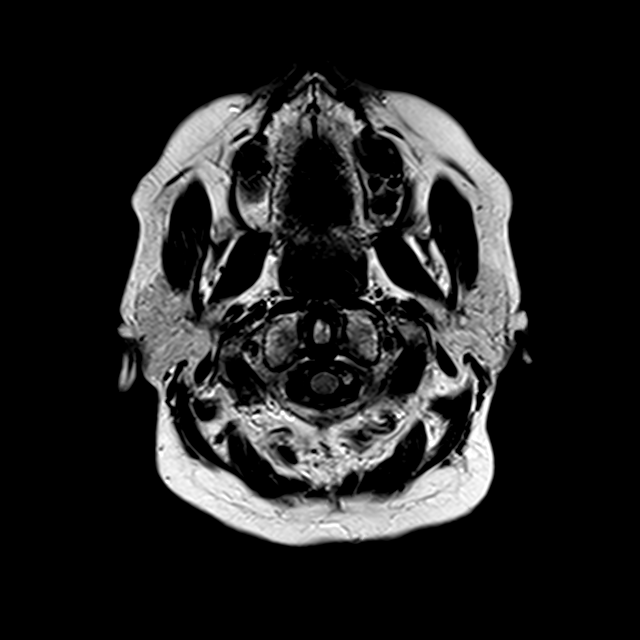
[im 23/23]
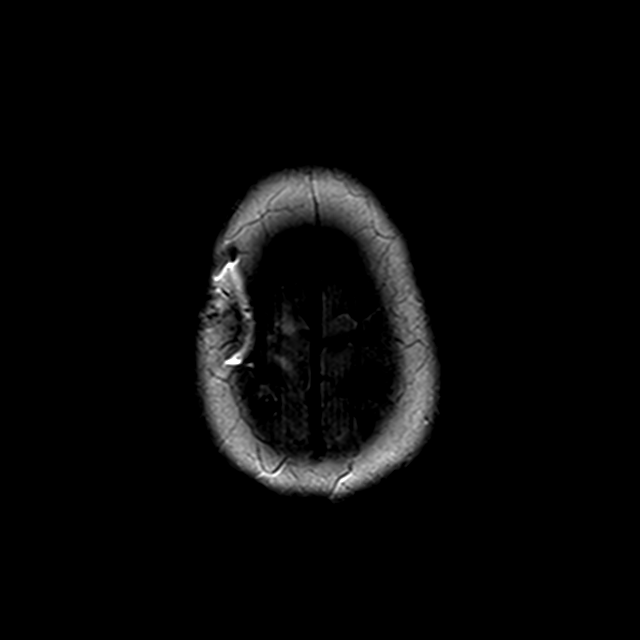

[Series 13: T1 post-contrast · axial · 2.0mm · 0.43mm/px · z∈[-53,+106]mm · 8 of 81 slices shown (1 of 2)]
[im 1/81]
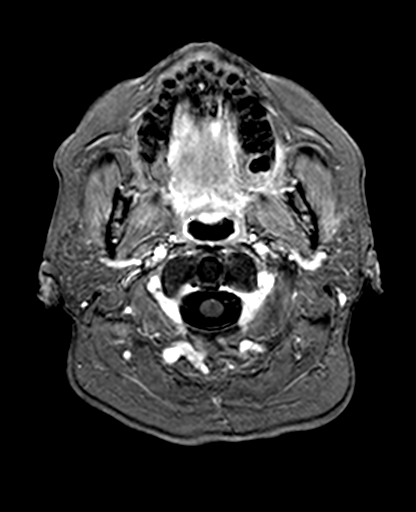
[im 12/81]
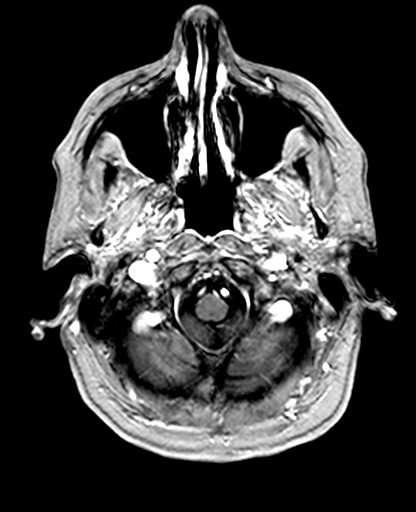
[im 23/81]
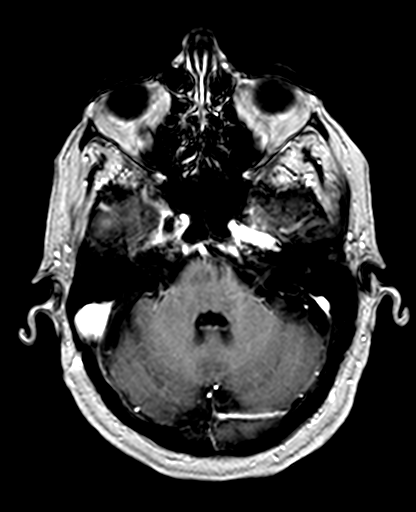
[im 35/81]
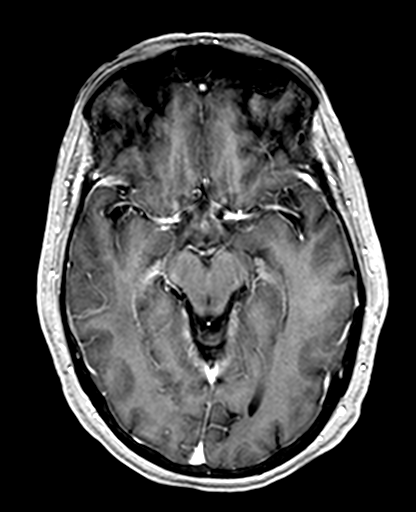
[im 46/81]
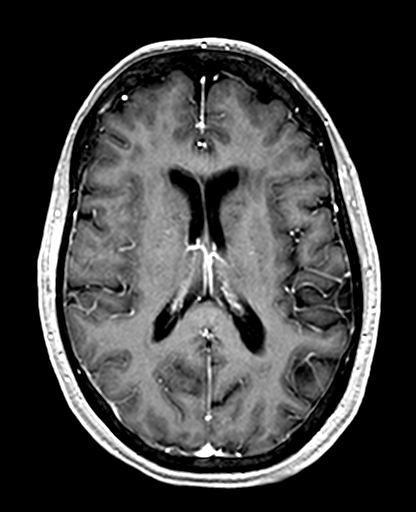
[im 58/81]
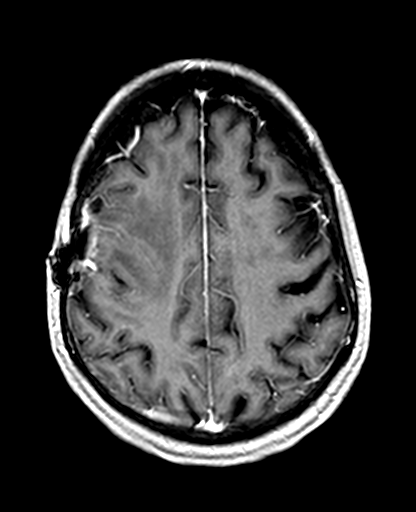
[im 69/81]
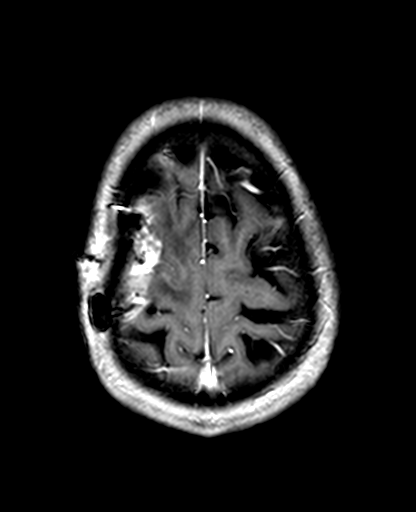
[im 81/81]
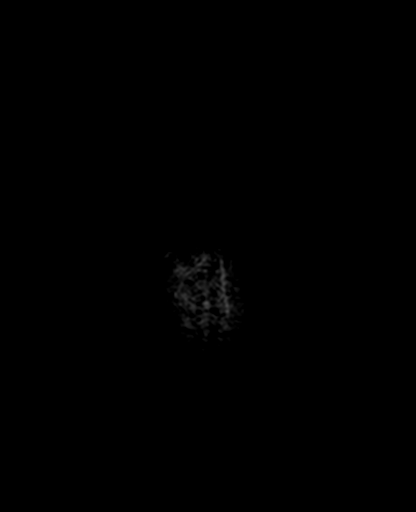

[Series 14: T1 post-contrast · coronal · 5.0mm · 0.36mm/px · 2 of 28 slices shown (2 of 2)]
[im 1/28]
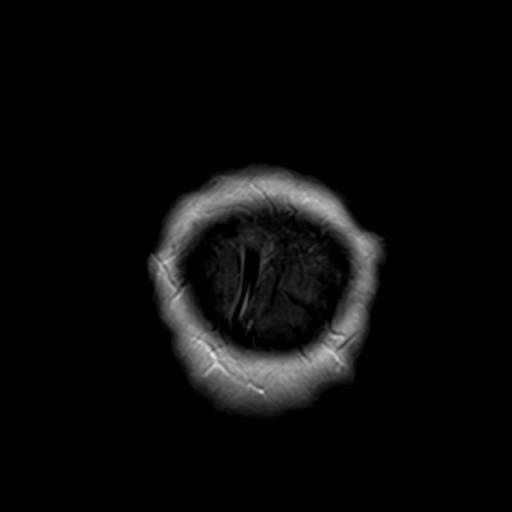
[im 14/28]
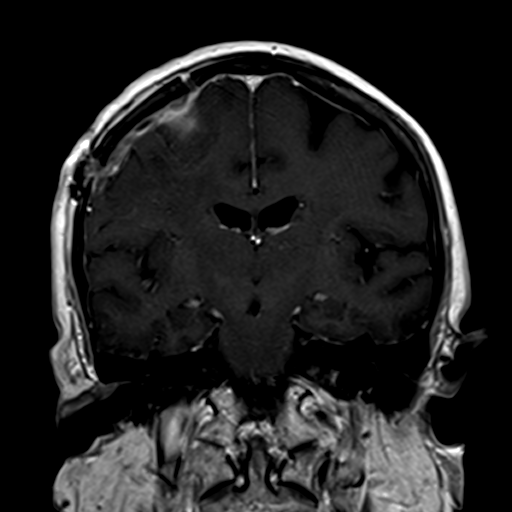

[29 of 48 positions shown; findings below may reference images not displayed]

FINDINGS: INTRACRANIAL CONTENTS: No reduced diffusion to suggest acute
ischemia or hypercellular tumor with consideration to prior imaging.
Susceptibility artifact along the margin of RIGHT frontal resection
cavity. Decreased surrounding vasogenic edema, no residual mass
effect. T1 shortening within the resection cavity with irregular
marginal enhancement, 17 x 15 mm. Prior satellite nodules anterior
to the dominant lesion are not apparent on this examination. No new
areas of abnormal parenchymal enhancement. Ventricles and sulci are
normal for patient's age. Patchy to confluent supratentorial and
pontine white matter FLAIR T2 hyperintensities compatible with
moderate to severe chronic small vessel ischemic disease, exclusive
of the aforementioned abnormality. No abnormal extra-axial fluid
collections. Dural enhancement subjacent to new RIGHT frontal
craniotomy without extra-axial fluid collections.

VASCULAR: Normal major intracranial vascular flow voids present at
skull base.

SKULL AND UPPER CERVICAL SPINE: No abnormal sellar expansion. New
RIGHT frontal craniotomy. No suspicious calvarial bone marrow
signal. Craniocervical junction maintained.

SINUSES/ORBITS: The mastoid air-cells and included paranasal sinuses
are well-aerated.The included ocular globes and orbital contents are
non-suspicious.

OTHER: None.
IMPRESSION: Status post RIGHT frontal craniotomy for resection of GBM, marginal
enhancement along the resection cavity seen with post surgical
cytotoxic edema and, residual tumor. RIGHT frontal satellite nodules
present on prior examination are not identified today of unclear
significance (likely artifact due to different MR scanner).
Decreased edema and mass effect within RIGHT frontal lobe though,
residual nonenhancing infiltrative tumor is possible.
